# Patient Record
Sex: Male | Born: 1960 | Race: White | Hispanic: No | Marital: Single | Smoking: Current every day smoker
Health system: Southern US, Community
[De-identification: ages and names within clinical notes are randomized; demographics above are authoritative.]

## PROBLEM LIST (undated history)

## (undated) DIAGNOSIS — E119 Type 2 diabetes mellitus without complications: Secondary | ICD-10-CM

## (undated) DIAGNOSIS — K759 Inflammatory liver disease, unspecified: Secondary | ICD-10-CM

## (undated) DIAGNOSIS — I10 Essential (primary) hypertension: Secondary | ICD-10-CM

## (undated) DIAGNOSIS — M1990 Unspecified osteoarthritis, unspecified site: Secondary | ICD-10-CM

## (undated) DIAGNOSIS — J449 Chronic obstructive pulmonary disease, unspecified: Secondary | ICD-10-CM

## (undated) DIAGNOSIS — G629 Polyneuropathy, unspecified: Secondary | ICD-10-CM

## (undated) DIAGNOSIS — N189 Chronic kidney disease, unspecified: Secondary | ICD-10-CM

## (undated) DIAGNOSIS — K219 Gastro-esophageal reflux disease without esophagitis: Secondary | ICD-10-CM

## (undated) DIAGNOSIS — R519 Headache, unspecified: Secondary | ICD-10-CM

## (undated) HISTORY — DX: Chronic kidney disease, unspecified: N18.9

## (undated) HISTORY — PX: FOOT SURGERY: SHX648

## (undated) HISTORY — PX: OTHER SURGICAL HISTORY: SHX169

---

## 2021-07-05 ENCOUNTER — Other Ambulatory Visit: Payer: Self-pay | Admitting: Nephrology

## 2021-07-05 ENCOUNTER — Other Ambulatory Visit (HOSPITAL_COMMUNITY): Payer: Self-pay | Admitting: Nephrology

## 2021-07-05 DIAGNOSIS — N189 Chronic kidney disease, unspecified: Secondary | ICD-10-CM

## 2021-07-09 ENCOUNTER — Ambulatory Visit (INDEPENDENT_AMBULATORY_CARE_PROVIDER_SITE_OTHER): Payer: Medicaid Other | Admitting: Orthopaedic Surgery

## 2021-07-09 ENCOUNTER — Ambulatory Visit: Payer: Medicaid Other

## 2021-07-09 ENCOUNTER — Other Ambulatory Visit: Payer: Self-pay

## 2021-07-09 ENCOUNTER — Encounter: Payer: Self-pay | Admitting: Orthopaedic Surgery

## 2021-07-09 VITALS — BP 148/97 | HR 78 | Ht 69.0 in | Wt 179.2 lb

## 2021-07-09 DIAGNOSIS — G8929 Other chronic pain: Secondary | ICD-10-CM

## 2021-07-09 DIAGNOSIS — S46211S Strain of muscle, fascia and tendon of other parts of biceps, right arm, sequela: Secondary | ICD-10-CM

## 2021-07-09 DIAGNOSIS — M542 Cervicalgia: Secondary | ICD-10-CM

## 2021-07-09 DIAGNOSIS — G5601 Carpal tunnel syndrome, right upper limb: Secondary | ICD-10-CM | POA: Diagnosis not present

## 2021-07-09 DIAGNOSIS — M25511 Pain in right shoulder: Secondary | ICD-10-CM

## 2021-07-09 MED ORDER — HYDROCODONE-ACETAMINOPHEN 5-325 MG PO TABS: ORAL_TABLET | ORAL | 0 refills | Status: DC

## 2021-07-09 NOTE — Progress Notes (Signed)
Subjective:    Patient ID: Curtis Hartman, male    DOB: 22-May-1961, 60 y.o.   MRN: [id number]  HPI He complains of right shoulder pain that has been present many months.  He denies trauma.  He has numbness in his right hand, weakness in the right hand and arm.  He has no redness or swelling.  He has been at [facility] in [place].  I have the notes and I have reviewed the X-rays of the right shoulder which is negative.  I have independently reviewed and interpreted x-rays of this patient done at another site by another physician or qualified health professional.  He has defect of mid upper arm consistent with biceps muscle/tendon rupture.  He has noticed some pain in the neck with motion.  He has good motion of the right shoulder but it pops at times.     Review of Systems  Constitutional:  Positive for activity change.  Musculoskeletal:  Positive for arthralgias, myalgias and neck pain.  All other systems reviewed and are negative. For Review of Systems, all other systems reviewed and are negative.  The following is a summary of the past history medically, past history surgically, known current medicines, social history and family history.  This information is gathered electronically by the computer from prior information and documentation.  I review this each visit and have found including this information at this point in the chart is beneficial and informative.   No past medical history on file.  Past Surgical History:  Procedure Laterality Date   foot right partial toe amputation Right    FOOT SURGERY     hernia surgery x4      No current outpatient medications on file prior to visit.   No current facility-administered medications on file prior to visit.    Social History   Socioeconomic History   Marital status: Single    Spouse name: Not on file   Number of children: Not on file   Years of education: Not on file   Highest education level: Not on file   Occupational History   Not on file  Tobacco Use   Smoking status: Every Day    Types: Cigarettes    Passive exposure: Never   Smokeless tobacco: Never  Substance and Sexual Activity   Alcohol use: Not on file   Drug use: Not on file   Sexual activity: Not on file  Other Topics Concern   Not on file  Social History Narrative   Not on file   Social Determinants of Health   Financial Resource Strain: Not on file  Food Insecurity: Not on file  Transportation Needs: Not on file  Physical Activity: Not on file  Stress: Not on file  Social Connections: Not on file  Intimate Partner Violence: Not on file    Family History  Problem Relation Age of Onset   Heart disease Mother    Seizures Father    Stroke Maternal Grandfather    Liver disease Paternal Grandmother     BP (!) 148/97   Pulse 78   Ht 5\' 9"  (1.753 m)   Wt 179 lb 4 oz (81.3 kg)   BMI 26.47 kg/m   Body mass index is 26.47 kg/m.       Objective:   Physical Exam Vitals and nursing note reviewed. Exam conducted with a chaperone present.  Constitutional:      Appearance: He is well-developed.  HENT:     Head: Normocephalic  and atraumatic.  Eyes:     Conjunctiva/sclera: Conjunctivae normal.     Pupils: Pupils are equal, round, and reactive to light.  Cardiovascular:     Rate and Rhythm: Normal rate and regular rhythm.  Pulmonary:     Effort: Pulmonary effort is normal.  Abdominal:     Palpations: Abdomen is soft.  Musculoskeletal:       Arms:       Hands:     Cervical back: Normal range of motion and neck supple.  Skin:    General: Skin is warm and dry.  Neurological:     Mental Status: He is alert and oriented to person, place, and time.     Cranial Nerves: No cranial nerve deficit.     Motor: No abnormal muscle tone.     Coordination: Coordination normal.     Deep Tendon Reflexes: Reflexes are normal and symmetric. Reflexes normal.  Psychiatric:        Behavior: Behavior normal.         Thought Content: Thought content normal.        Judgment: Judgment normal.  X-rays were done of the cervical spine, reported separately.        Assessment & Plan:   Encounter Diagnoses  Name Primary?   Neck pain Yes   Chronic right shoulder pain    Partial thenar atrophy of right hand    Traumatic partial tear of biceps tendon, right, sequela    I will get MRI of the cervical spine.  I will get EMGs.  I will give pain medicine.  He cannot take NSAIDs.  I have reviewed the [place] Controlled Substance Reporting System web site prior to prescribing narcotic medicine for this patient.  Return in three weeks.  Call if any problem.  Precautions discussed.  Electronically Signed Darreld Mclean, MD 9/6/20222:27 PM

## 2021-07-15 ENCOUNTER — Telehealth: Payer: Self-pay | Admitting: Radiology

## 2021-07-15 NOTE — Telephone Encounter (Signed)
Patient called, said that he is out of pain  medication.  He says it is not really helping with his pain.  Is there anything stronger you can give him?  If not this medication will have to do he says.  He has his MRI, [place] and NCV scheduled, the last of which is the NCV on 08/06/2021.  He has appt with you in office to review all on 08/08/2021.  He will call Dr Percival Blas office and ask to be added to the cancellation list as well.

## 2021-07-17 ENCOUNTER — Telehealth: Payer: Self-pay | Admitting: Orthopaedic Surgery

## 2021-07-17 MED ORDER — HYDROCODONE-ACETAMINOPHEN 5-325 MG PO TABS: ORAL_TABLET | ORAL | 0 refills | Status: DC

## 2021-07-17 NOTE — Telephone Encounter (Signed)
Patient states that he needs preauthorization for the Hydrocodone that you prescribed.    Can this be done or is there something else he can get in place of this?  Please let patient know  Thanks

## 2021-07-22 ENCOUNTER — Other Ambulatory Visit: Payer: Self-pay

## 2021-07-22 ENCOUNTER — Ambulatory Visit (HOSPITAL_COMMUNITY)
Admission: RE | Admit: 2021-07-22 | Discharge: 2021-07-22 | Disposition: A | Discharge status: Home / Self Care | Payer: Medicaid Other | Source: Ambulatory Visit | Attending: Nephrology | Admitting: Nephrology

## 2021-07-22 ENCOUNTER — Ambulatory Visit (HOSPITAL_COMMUNITY)
Admission: RE | Admit: 2021-07-22 | Discharge: 2021-07-22 | Disposition: A | Discharge status: Home / Self Care | Payer: Medicaid Other | Source: Ambulatory Visit | Attending: Orthopaedic Surgery | Admitting: Orthopaedic Surgery

## 2021-07-22 DIAGNOSIS — M542 Cervicalgia: Secondary | ICD-10-CM | POA: Insufficient documentation

## 2021-07-22 DIAGNOSIS — G5601 Carpal tunnel syndrome, right upper limb: Secondary | ICD-10-CM | POA: Insufficient documentation

## 2021-07-22 DIAGNOSIS — N189 Chronic kidney disease, unspecified: Secondary | ICD-10-CM

## 2021-07-24 ENCOUNTER — Telehealth: Payer: Self-pay | Admitting: Radiology

## 2021-07-24 ENCOUNTER — Telehealth: Payer: Self-pay | Admitting: Physical Medicine and Rehabilitation

## 2021-07-24 NOTE — Telephone Encounter (Signed)
Prior authorization  request received via fax for Hydrocodone, put the form they sent along with the Cover my meds sheet in your bottom box.   It is started on cover my meds the key is on the form

## 2021-07-24 NOTE — Telephone Encounter (Signed)
Called patient to advise that we do not have any earlier appointments of that type.

## 2021-07-24 NOTE — Telephone Encounter (Signed)
Pt called requesting a call back as soon as possible. Pt states he would like an earlier appt if possible. He need to know right away due to the fact he may have to move another appt . Please call pt at [phone].

## 2021-08-01 ENCOUNTER — Ambulatory Visit: Payer: Medicaid Other | Admitting: Orthopaedic Surgery

## 2021-08-06 ENCOUNTER — Other Ambulatory Visit: Payer: Self-pay

## 2021-08-06 ENCOUNTER — Encounter: Payer: Self-pay | Admitting: Physical Medicine and Rehabilitation

## 2021-08-06 ENCOUNTER — Ambulatory Visit (INDEPENDENT_AMBULATORY_CARE_PROVIDER_SITE_OTHER): Payer: Medicaid Other | Admitting: Physical Medicine and Rehabilitation

## 2021-08-06 DIAGNOSIS — M4802 Spinal stenosis, cervical region: Secondary | ICD-10-CM

## 2021-08-06 DIAGNOSIS — M5412 Radiculopathy, cervical region: Secondary | ICD-10-CM

## 2021-08-06 DIAGNOSIS — R202 Paresthesia of skin: Secondary | ICD-10-CM

## 2021-08-06 DIAGNOSIS — M79601 Pain in right arm: Secondary | ICD-10-CM | POA: Diagnosis not present

## 2021-08-06 NOTE — Progress Notes (Signed)
A1C 7.8. Right sided neck and shoulder pain radiating to forearm. Right hand numbness. Pain increases at night- wakes him up at night. Right hand dominant No lotion per patient

## 2021-08-07 ENCOUNTER — Encounter: Payer: Self-pay | Admitting: Physical Medicine and Rehabilitation

## 2021-08-07 NOTE — Progress Notes (Signed)
Curtis Hartman - 60 y.o. male MRN [id number]  Date of birth: [birth year]  Office Visit Note: Visit Date: 08/06/2021 PCP: Waldon Reining, MD Referred by: Waldon Reining, MD  Subjective: Chief Complaint  Patient presents with   Neck - Pain   Right Shoulder - Pain   Right Hand - Numbness   HPI:  Curtis Hartman is a 60 y.o. male who comes in today At the request of Dr. Darreld Mclean for evaluation and management of chronic worsening severe neck shoulder and arm pain with referral pattern into the hand in a nondermatomal fashion somewhat more ulnar side of the hand.  Patient has a long history of diabetes with hemoglobin A1c of 7.8.  He is insulin-dependent diabetic.  He has known peripheral polyneuropathy.  No prior electrodiagnostic study to review.  He reports chronic severe pain over the last many months going on now with really worsening to the point where he just cannot stand the pain in his right arm.  He has numbness and tingling in the left hand but does not have pain like on the right.  He has had no prior history of carpal tunnel syndrome etc.  He has had some atrophy of the muscles bilaterally and he does get symptoms of the feet.  He has had no relief really with medication management to this point.  This includes nerve membrane stabilizing medications and opioids.  He did have a recent cervical MRI which is reviewed below in the notes fully.  This does show significant C6-7 paracentral lateral recess narrowing and cord flattening but also below this level at C7-T1 likely representing a synovial cyst complex in nature.  This severely narrows the right eccentric side of the canal.  He has noticed increased weakness.  He has had no prior history of cervical spine surgery.  Review of Systems  Musculoskeletal:  Positive for joint pain and neck pain.  Neurological:  Positive for tingling and focal weakness.  All other systems reviewed and are negative. Otherwise per  HPI.  Assessment & Plan: Visit Diagnoses:    ICD-10-CM   1. Paresthesia of skin  R20.2 NCV with EMG (electromyography)    2. Cervical radiculopathy  M54.12     3. Spinal stenosis of cervical region  M48.02     4. Pain in right arm  M79.601       Plan: Impression: The above electrodiagnostic study is ABNORMAL and very complicated to interpret but reveals evidence of severe sensorimotor length dependent peripheral neuropathy of the right upper extremity.  Given his cervical MRI findings there is likely an underlying C8 radiculopathy although it is hard to elucidate that with the level of neuropathy that he has.  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  There is always difficulty teasing out nerve compression from neuropathy in these instances.  **Clinically his symptoms fit more with the cervical spine issue noted in the MRI.  There is no significant electrodiagnostic evidence of any other focal nerve entrapment, brachial plexopathy or cervical radiculopathy.   Recommendations: 1.  Follow-up with referring physician. 2.  Continue current management of symptoms.  Neurosurgical or spine surgery referral for consultation.  Meds & Orders: No orders of the defined types were placed in this encounter.   Orders Placed This Encounter  Procedures   NCV with EMG (electromyography)    Follow-up: Return in about 2 weeks (around 08/20/2021) for Darreld Mclean,  MD.   Procedures: No procedures performed  EMG & NCV Findings: Evaluation of the right median motor nerve showed prolonged distal onset latency (10.4 ms), reduced amplitude (0.3 mV), and decreased conduction velocity (Elbow-Wrist, 30 m/s).  The right ulnar motor nerve showed prolonged distal onset latency (6.3 ms), reduced amplitude (2.5 mV), decreased conduction velocity (B Elbow-Wrist, 41 m/s), and decreased conduction velocity (A Elbow-B Elbow, 45  m/s).  The right median (across palm) sensory nerve showed no response (Wrist) and prolonged distal peak latency (Palm, 3.2 ms).  The right radial sensory nerve showed prolonged distal peak latency (3.8 ms).  The right ulnar sensory nerve showed prolonged distal peak latency (9.3 ms), reduced amplitude (14.6 V), and decreased conduction velocity (Wrist-5th Digit, 15 m/s).    Needle evaluation of the right first dorsal interosseous and the right abductor pollicis brevis muscles showed decreased insertional activity, widespread spontaneous activity, and diminished recruitment.  All remaining muscles (as indicated in the following table) showed no evidence of electrical instability.    Impression: The above electrodiagnostic study is ABNORMAL and very complicated to interpret but reveals evidence of severe sensorimotor length dependent peripheral neuropathy of the right upper extremity.  Given his cervical MRI findings there is likely an underlying C8 radiculopathy although it is hard to elucidate that with the level of neuropathy that he has.  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  There is always difficulty teasing out nerve compression from neuropathy in these instances.  **Clinically his symptoms fit more with the cervical spine issue noted in the MRI.  There is no significant electrodiagnostic evidence of any other focal nerve entrapment, brachial plexopathy or cervical radiculopathy.   Recommendations: 1.  Follow-up with referring physician. 2.  Continue current management of symptoms.  ___________________________ Naaman Plummer FAAPMR Board Certified, American Board of Physical Medicine and Rehabilitation    Nerve Conduction Studies Anti Sensory Summary Table   Stim Site NR Peak (ms) Norm Peak (ms) P-T Amp (V) Norm P-T Amp Site1 Site2 Delta-P (ms) Dist (cm) Vel (m/s) Norm Vel (m/s)  Right Median  Acr Palm Anti Sensory (2nd Digit)  28.8C  Wrist *NR  <3.6  >10 Wrist Palm  0.0    Palm    *3.2 <2.0 4.8         Right Radial Anti Sensory (Base 1st Digit)  28.6C  Wrist    *3.8 <3.1 4.8  Wrist Base 1st Digit 3.8 0.0    Right Ulnar Anti Sensory (5th Digit)  28.8C  Wrist    *9.3 <3.7 *14.6 >15.0 Wrist 5th Digit 9.3 14.0 *15 >38   Motor Summary Table   Stim Site NR Onset (ms) Norm Onset (ms) O-P Amp (mV) Norm O-P Amp Site1 Site2 Delta-0 (ms) Dist (cm) Vel (m/s) Norm Vel (m/s)  Right Median Motor (Abd Poll Brev)  28.7C  Wrist    *10.4 <4.2 *0.3 >5 Elbow Wrist 7.2 21.5 *30 >50  Elbow    17.6  0.2         Right Ulnar Motor (Abd Dig Min)  28.7C  Wrist    *6.3 <4.2 *2.5 >3 B Elbow Wrist 4.9 20.0 *41 >53  B Elbow    11.2  1.8  A Elbow B Elbow 2.2 10.0 *45 >53  A Elbow    13.4  1.5          EMG   Side Muscle Nerve Root Ins Act  Fibs Psw Amp Dur Poly Recrt Int Dennie Bible Comment  Right 1stDorInt Ulnar C8-T1 *Decr *4+ *4+ Nml Nml 0 *Reduced Nml   Right Abd Poll Brev Median C8-T1 *Decr *4+ *4+ Nml Nml 0 *Reduced Nml   Right ExtDigCom   Nml Nml Nml Nml Nml 0 Nml Nml   Right Triceps Radial C6-7-8 Nml Nml Nml Nml Nml 0 Nml Nml   Right Deltoid Axillary C5-6 Nml Nml Nml Nml Nml 0 Nml Nml     Nerve Conduction Studies Anti Sensory Left/Right Comparison   Stim Site L Lat (ms) R Lat (ms) L-R Lat (ms) L Amp (V) R Amp (V) L-R Amp (%) Site1 Site2 L Vel (m/s) R Vel (m/s) L-R Vel (m/s)  Median Acr Palm Anti Sensory (2nd Digit)  28.8C  Wrist       Wrist Palm     Palm  *3.2   4.8        Radial Anti Sensory (Base 1st Digit)  28.6C  Wrist  *3.8   4.8  Wrist Base 1st Digit     Ulnar Anti Sensory (5th Digit)  28.8C  Wrist  *9.3   *14.6  Wrist 5th Digit  *15    Motor Left/Right Comparison   Stim Site L Lat (ms) R Lat (ms) L-R Lat (ms) L Amp (mV) R Amp (mV) L-R Amp (%) Site1 Site2 L Vel (m/s) R Vel (m/s) L-R Vel (m/s)  Median Motor (Abd Poll Brev)  28.7C  Wrist  *10.4   *0.3  Elbow Wrist  *30   Elbow   17.6   0.2        Ulnar Motor (Abd Dig Min)  28.7C  Wrist  *6.3   *2.5  B Elbow Wrist  *41   B Elbow  11.2   1.8  A Elbow B Elbow  *45   A Elbow  13.4   1.5           Waveforms:            Clinical History: MRI CERVICAL SPINE WITHOUT CONTRAST   TECHNIQUE: Multiplanar, multisequence MR imaging of the cervical spine was performed. No intravenous contrast was administered.   COMPARISON:  Cervical radiographs 07/09/2021 without report.   FINDINGS: Alignment: Straightening of the normal cervical lordosis. Slight anterolisthesis of C7 on T1. Otherwise, no sagittal subluxation.   Vertebrae: Vertebral body heights are maintained. No focal marrow edema to suggest acute fracture or discitis/osteomyelitis.   Cord: Normal cord signal.   Posterior Fossa, vertebral arteries, paraspinal tissues: Partially imaged lipoma in the left posterior paraspinal soft tissues superficially. Visualized vertebral artery flow voids are maintained. No evidence of acute abnormality in the visualized posterior fossa.   Disc levels:   C2-C3: Small posterior disc osteophyte complex and left-greater-than-right facet and uncovertebral hypertrophy with mild left foraminal stenosis. No significant canal stenosis.   C3-C4: Small posterior disc osteophyte complex and left greater than right facet and uncovertebral hypertrophy with moderate left-greater-than-right foraminal stenosis and mild canal stenosis.   C4-C5: Posterior disc osteophyte complex and bilateral facet and uncovertebral hypertrophy. Resulting severe left-greater-than-right foraminal stenosis with moderate canal stenosis. Disc flattens the ventral cord.   C5-C6: Small posterior disc osteophyte complex and left-greater-than-right uncovertebral hypertrophy with mild left foraminal stenosis. No significant canal stenosis.   C6-C7: Right eccentric posterior disc osteophyte complex with mild-to-moderate right eccentric canal stenosis.  This flattens the right ventral cord. Severe right and mild left foraminal stenosis.   C7-T1: Approximately 7 x 9 mm peripherally  T2 hypointense centrally T2 hyperintense structure in the posterolateral right canal associated with the right facet joint, probably a complex synovial cyst. Resulting severely narrows the right eccentric canal. The cord is displaced to the left with overall mild to moderate canal stenosis. Otherwise, no significant foraminal stenosis.   IMPRESSION: 1. At C4-C5, moderate canal stenosis with severe left-greater-than-right foraminal stenosis. 2. At C6-C7, severe right and mild left foraminal stenosis with mild-to-moderate right eccentric canal stenosis. 3. 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 Overall mild to moderate canal stenosis. 4. At C3-C4, mild canal stenosis with moderate left-greater-than-right foraminal stenosis.     Electronically Signed   By: Feliberto Harts M.D.   On: 07/23/2021 09:07     Objective:  VS:  HT:    WT:   BMI:     BP:   HR: bpm  TEMP: ( )  RESP:  Physical Exam Musculoskeletal:     Cervical back: Tenderness present. No rigidity.     Comments: Inspection reveals atrophy of the bilateral APB and FDI and hand intrinsics.  There appears to be atrophy of the right proximal biceps compared to left.  No history of biceps tendon rupture.  There is no swelling, color changes, allodynia or dystrophic changes. There is 3 out of 5 strength in the bilateral finger abduction but 5 out of 5 with long finger flexion.  There is nondermatomal decrease sensation bilaterally in the hands.  There is a positive Froment's test bilaterally. There is a negative Hoffmann's test bilaterally.  Lymphadenopathy:     Cervical: No cervical adenopathy.  Skin:    General: Skin is warm and dry.      Findings: No erythema or rash.  Neurological:     General: No focal deficit present.     Mental Status: He is alert and oriented to person, place, and time.     Sensory: No sensory deficit.     Motor: No weakness or abnormal muscle tone.     Coordination: Coordination normal.     Gait: Gait normal.  Psychiatric:        Mood and Affect: Mood normal.        Behavior: Behavior normal.        Thought Content: Thought content normal.     Imaging: No results found.

## 2021-08-07 NOTE — Procedures (Signed)
EMG & NCV Findings: Evaluation of the right median motor nerve showed prolonged distal onset latency (10.4 ms), reduced amplitude (0.3 mV), and decreased conduction velocity (Elbow-Wrist, 30 m/s).  The right ulnar motor nerve showed prolonged distal onset latency (6.3 ms), reduced amplitude (2.5 mV), decreased conduction velocity (B Elbow-Wrist, 41 m/s), and decreased conduction velocity (A Elbow-B Elbow, 45 m/s).  The right median (across palm) sensory nerve showed no response (Wrist) and prolonged distal peak latency (Palm, 3.2 ms).  The right radial sensory nerve showed prolonged distal peak latency (3.8 ms).  The right ulnar sensory nerve showed prolonged distal peak latency (9.3 ms), reduced amplitude (14.6 V), and decreased conduction velocity (Wrist-5th Digit, 15 m/s).    Needle evaluation of the right first dorsal interosseous and the right abductor pollicis brevis muscles showed decreased insertional activity, widespread spontaneous activity, and diminished recruitment.  All remaining muscles (as indicated in the following table) showed no evidence of electrical instability.    Impression: The above electrodiagnostic study is ABNORMAL and very complicated to interpret but reveals evidence of severe sensorimotor length dependent peripheral neuropathy of the right upper extremity.  Given his cervical MRI findings there is likely an underlying C8 radiculopathy although it is hard to elucidate that with the level of neuropathy that he has.  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  There is always difficulty teasing out nerve compression from neuropathy in these instances.  **Clinically his symptoms fit more with the cervical spine issue noted in the MRI.  There is no significant electrodiagnostic evidence of any other focal nerve entrapment, brachial plexopathy or cervical radiculopathy.    Recommendations: 1.  Follow-up with referring physician. 2.  Continue current management of symptoms.  ___________________________ Curtis Hartman FAAPMR Board Certified, American Board of Physical Medicine and Rehabilitation    Nerve Conduction Studies Anti Sensory Summary Table   Stim Site NR Peak (ms) Norm Peak (ms) P-T Amp (V) Norm P-T Amp Site1 Site2 Delta-P (ms) Dist (cm) Vel (m/s) Norm Vel (m/s)  Right Median Acr Palm Anti Sensory (2nd Digit)  28.8C  Wrist *NR  <3.6  >10 Wrist Palm  0.0    Palm    *3.2 <2.0 4.8         Right Radial Anti Sensory (Base 1st Digit)  28.6C  Wrist    *3.8 <3.1 4.8  Wrist Base 1st Digit 3.8 0.0    Right Ulnar Anti Sensory (5th Digit)  28.8C  Wrist    *9.3 <3.7 *14.6 >15.0 Wrist 5th Digit 9.3 14.0 *15 >38   Motor Summary Table   Stim Site NR Onset (ms) Norm Onset (ms) O-P Amp (mV) Norm O-P Amp Site1 Site2 Delta-0 (ms) Dist (cm) Vel (m/s) Norm Vel (m/s)  Right Median Motor (Abd Poll Brev)  28.7C  Wrist    *10.4 <4.2 *0.3 >5 Elbow Wrist 7.2 21.5 *30 >50  Elbow    17.6  0.2         Right Ulnar Motor (Abd Dig Min)  28.7C  Wrist    *6.3 <4.2 *2.5 >3 B Elbow Wrist 4.9 20.0 *41 >53  B Elbow    11.2  1.8  A Elbow B Elbow 2.2 10.0 *45 >53  A Elbow    13.4  1.5          EMG   Side Muscle Nerve Root Ins Act Fibs Psw Amp Dur Poly Recrt Int Dennie Bible Comment  Right 1stDorInt Ulnar C8-T1 *Decr *4+ *4+ Nml Nml 0 *Reduced Nml   Right Abd Poll Brev Median C8-T1 *Decr *4+ *4+ Nml Nml 0 *Reduced Nml   Right ExtDigCom   Nml Nml Nml Nml Nml 0 Nml Nml   Right Triceps Radial C6-7-8 Nml Nml Nml Nml Nml 0 Nml Nml   Right Deltoid Axillary C5-6 Nml Nml Nml Nml Nml 0 Nml Nml     Nerve Conduction Studies Anti Sensory Left/Right Comparison   Stim Site L Lat (ms) R Lat (ms) L-R Lat (ms) L Amp (V) R Amp (V) L-R Amp (%) Site1 Site2 L Vel (m/s) R Vel (m/s) L-R Vel (m/s)  Median Acr Palm Anti Sensory (2nd Digit)  28.8C  Wrist       Wrist Palm     Palm  *3.2   4.8         Radial Anti Sensory (Base 1st Digit)  28.6C  Wrist  *3.8   4.8  Wrist Base 1st Digit     Ulnar Anti Sensory (5th Digit)  28.8C  Wrist  *9.3   *14.6  Wrist 5th Digit  *15    Motor Left/Right Comparison   Stim Site L Lat (ms) R Lat (ms) L-R Lat (ms) L Amp (mV) R Amp (mV) L-R Amp (%) Site1 Site2 L Vel (m/s) R Vel (m/s) L-R Vel (m/s)  Median Motor (Abd Poll Brev)  28.7C  Wrist  *10.4   *0.3  Elbow Wrist  *30   Elbow  17.6   0.2        Ulnar Motor (Abd Dig Min)  28.7C  Wrist  *6.3   *2.5  B Elbow Wrist  *41   B Elbow  11.2   1.8  A Elbow B Elbow  *45   A Elbow  13.4   1.5           Waveforms:

## 2021-08-08 ENCOUNTER — Encounter: Payer: Self-pay | Admitting: Orthopaedic Surgery

## 2021-08-08 ENCOUNTER — Ambulatory Visit (INDEPENDENT_AMBULATORY_CARE_PROVIDER_SITE_OTHER): Payer: Medicaid Other | Admitting: Orthopaedic Surgery

## 2021-08-08 ENCOUNTER — Other Ambulatory Visit: Payer: Self-pay

## 2021-08-08 DIAGNOSIS — M542 Cervicalgia: Secondary | ICD-10-CM

## 2021-08-08 MED ORDER — HYDROCODONE-ACETAMINOPHEN 5-325 MG PO TABS: 1.0000 | ORAL_TABLET | ORAL | 0 refills | Status: AC | PRN

## 2021-08-08 NOTE — Progress Notes (Signed)
I am worse.  He had the MRI of the neck and EMGs by Dr. Alvester Morin.  Dr. 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  Given his cervical MRI findings there is likely an underlying C8 radiculopathy although it is hard to elucidate that with the level of neuropathy that he has.  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  There is always difficulty teasing out nerve compression from neuropathy in these instances.  MRI showed: IMPRESSION: 1. At C4-C5, moderate canal stenosis with severe left-greater-than-right foraminal stenosis. 2. At C6-C7, severe right and mild left foraminal stenosis with mild-to-moderate right eccentric canal stenosis. 3. 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 Overall mild to moderate canal stenosis. 4. At C3-C4, mild canal stenosis with moderate left-greater-than-right foraminal stenosis.   His pain is on the right.  He is not sleeping well.  He is taking Advil.  I will renew Norco.  I will have him see neurosurgeon.    I have explained the MRI and the nerve study to him.  He is in pain.  I have independently reviewed the MRI.    Right arm is painful but has full ROM.  He has weakness on the right and triceps as well.  ROM of neck is full but tender. Encounter Diagnosis  Name Primary?   Neck pain Yes   I have reviewed the [place] Controlled Substance Reporting System web site prior to prescribing narcotic medicine for this patient.  To see neurosurgeon.  Call if any problem.  Precautions discussed.  Electronically Signed Darreld Mclean,  MD 10/6/202210:55 AM

## 2021-08-12 ENCOUNTER — Telehealth: Payer: Self-pay | Admitting: Orthopaedic Surgery

## 2021-08-12 NOTE — Telephone Encounter (Signed)
Hydrocodone-Acetaminophen  5/325 mg  Qty 30 Tablets   PATIENT USES [facility] IN [place]

## 2021-08-12 NOTE — Telephone Encounter (Signed)
Patient called and is upset that he has not been referred to a specialist yet.  I advised him there is one to [facility].  He said no one has called him yet.  His neck is killing him and he only has a half of days worth of pain medicine lett.   He needs something called in for his pain   Please call him back at [phone]

## 2021-08-14 ENCOUNTER — Other Ambulatory Visit (HOSPITAL_COMMUNITY): Payer: Self-pay | Admitting: Nephrology

## 2021-08-14 DIAGNOSIS — D631 Anemia in chronic kidney disease: Secondary | ICD-10-CM

## 2021-08-14 DIAGNOSIS — E1122 Type 2 diabetes mellitus with diabetic chronic kidney disease: Secondary | ICD-10-CM

## 2021-08-14 DIAGNOSIS — R809 Proteinuria, unspecified: Secondary | ICD-10-CM

## 2021-08-14 DIAGNOSIS — N189 Chronic kidney disease, unspecified: Secondary | ICD-10-CM

## 2021-08-14 NOTE — Telephone Encounter (Signed)
Patient called back again today stating he needs pain medicine.  He went to his kidney doctor today and he said he needs to not take anymore Ibuprofen due to his issues with his kidneys.  Dr. Hilda Lias only gave him 5 days worth of pain medicine and he ran out 2 days he said he is suppose to take 4 a day and he can't sleep and he is in pain VERY BAD pain!!    He is going to need something for the pain for 6 to 8 weeks before he can see the neurologist    Please call him back at [phone]

## 2021-08-15 ENCOUNTER — Encounter: Payer: Self-pay | Admitting: Internal Medicine

## 2021-08-15 NOTE — Telephone Encounter (Signed)
I spoke with patient yesterday (08/14/21) and it is documented in his referral. He told me that he hadn't heard anything from [facility], so I let him know that I had spoken with them before talking with him and they had left him a message for him to call them. I gave him their number again and told him I was told they could possibly get him in as early as next week if he calls back. He just called again and I told him exactly the same thing as before. He stated he tried calling them yesterday and got no answer, I told him that was hard to believe but he needed to keep calling until he gets someone to answer, so he could get an appointment. I also told him that Dr. Hilda Lias is not prescribing any medication at least until he knows he has an appointment with [facility] and then we can go from there.

## 2021-08-27 ENCOUNTER — Ambulatory Visit (HOSPITAL_COMMUNITY): Admission: RE | Admit: 2021-08-27 | Payer: Medicaid Other | Source: Ambulatory Visit

## 2021-08-27 ENCOUNTER — Encounter (HOSPITAL_COMMUNITY): Payer: Self-pay

## 2021-09-05 ENCOUNTER — Other Ambulatory Visit: Payer: Self-pay | Admitting: Radiology

## 2021-09-09 ENCOUNTER — Ambulatory Visit (HOSPITAL_COMMUNITY)
Admission: RE | Admit: 2021-09-09 | Discharge: 2021-09-09 | Disposition: A | Discharge status: Home / Self Care | Payer: Medicaid Other | Source: Ambulatory Visit | Attending: Nephrology | Admitting: Nephrology

## 2021-09-09 ENCOUNTER — Encounter (HOSPITAL_COMMUNITY): Payer: Self-pay

## 2021-09-09 DIAGNOSIS — N189 Chronic kidney disease, unspecified: Secondary | ICD-10-CM | POA: Insufficient documentation

## 2021-09-09 DIAGNOSIS — D631 Anemia in chronic kidney disease: Secondary | ICD-10-CM | POA: Insufficient documentation

## 2021-09-09 DIAGNOSIS — E1140 Type 2 diabetes mellitus with diabetic neuropathy, unspecified: Secondary | ICD-10-CM | POA: Insufficient documentation

## 2021-09-09 DIAGNOSIS — R809 Proteinuria, unspecified: Secondary | ICD-10-CM | POA: Insufficient documentation

## 2021-09-09 DIAGNOSIS — E1122 Type 2 diabetes mellitus with diabetic chronic kidney disease: Secondary | ICD-10-CM | POA: Insufficient documentation

## 2021-09-09 DIAGNOSIS — I129 Hypertensive chronic kidney disease with stage 1 through stage 4 chronic kidney disease, or unspecified chronic kidney disease: Secondary | ICD-10-CM | POA: Insufficient documentation

## 2021-09-09 DIAGNOSIS — E1121 Type 2 diabetes mellitus with diabetic nephropathy: Secondary | ICD-10-CM | POA: Diagnosis not present

## 2021-09-09 HISTORY — DX: Type 2 diabetes mellitus without complications: E11.9

## 2021-09-09 HISTORY — DX: Essential (primary) hypertension: I10

## 2021-09-09 LAB — CBC
HCT: 35.4 % — ABNORMAL LOW (ref 39.0–52.0)
Hemoglobin: 11.9 g/dL — ABNORMAL LOW (ref 13.0–17.0)
MCH: 31.2 pg (ref 26.0–34.0)
MCHC: 33.6 g/dL (ref 30.0–36.0)
MCV: 92.7 fL (ref 80.0–100.0)
Platelets: 328 10*3/uL (ref 150–400)
RBC: 3.82 MIL/uL — ABNORMAL LOW (ref 4.22–5.81)
RDW: 12.7 % (ref 11.5–15.5)
WBC: 9.1 10*3/uL (ref 4.0–10.5)
nRBC: 0 % (ref 0.0–0.2)

## 2021-09-09 LAB — PROTIME-INR
INR: 0.9 (ref 0.8–1.2)
Prothrombin Time: 12.2 seconds (ref 11.4–15.2)

## 2021-09-09 LAB — GLUCOSE, CAPILLARY: Glucose-Capillary: 262 mg/dL — ABNORMAL HIGH (ref 70–99)

## 2021-09-09 MED ORDER — SODIUM CHLORIDE 0.9 % IV SOLN: INTRAVENOUS | Status: DC

## 2021-09-09 MED ORDER — LIDOCAINE HCL (PF) 1 % IJ SOLN
INTRAMUSCULAR | Status: AC
  Filled 2021-09-09: qty 30

## 2021-09-09 MED ORDER — AMLODIPINE BESYLATE 10 MG PO TABS
10.0000 mg | ORAL_TABLET | ORAL | Status: AC
  Administered 2021-09-09: 10 mg via ORAL
  Filled 2021-09-09: qty 1

## 2021-09-09 MED ORDER — GELATIN ABSORBABLE 12-7 MM EX MISC
CUTANEOUS | Status: AC
  Filled 2021-09-09: qty 1

## 2021-09-09 MED ORDER — HYDRALAZINE HCL 20 MG/ML IJ SOLN
INTRAMUSCULAR | Status: AC | PRN
  Administered 2021-09-09 (×2): 10 mg via INTRAVENOUS

## 2021-09-09 MED ORDER — SODIUM CHLORIDE 0.9 % IV SOLN
INTRAVENOUS | Status: AC | PRN
Start: 2021-09-09 — End: 2021-09-09
  Administered 2021-09-09: 10 mL/h via INTRAVENOUS

## 2021-09-09 MED ORDER — FENTANYL CITRATE (PF) 100 MCG/2ML IJ SOLN
INTRAMUSCULAR | Status: AC | PRN
  Administered 2021-09-09: 25 ug via INTRAVENOUS
  Administered 2021-09-09: 50 ug via INTRAVENOUS

## 2021-09-09 MED ORDER — LOSARTAN POTASSIUM 25 MG PO TABS
12.5000 mg | ORAL_TABLET | ORAL | Status: AC
  Administered 2021-09-09: 12.5 mg via ORAL
  Filled 2021-09-09: qty 0.5

## 2021-09-09 MED ORDER — HYDRALAZINE HCL 20 MG/ML IJ SOLN
INTRAMUSCULAR | Status: AC
  Filled 2021-09-09: qty 1

## 2021-09-09 MED ORDER — MIDAZOLAM HCL 2 MG/2ML IJ SOLN
INTRAMUSCULAR | Status: AC | PRN
  Administered 2021-09-09: 1 mg via INTRAVENOUS
  Administered 2021-09-09: .5 mg via INTRAVENOUS

## 2021-09-09 MED ORDER — MIDAZOLAM HCL 2 MG/2ML IJ SOLN
INTRAMUSCULAR | Status: AC
  Filled 2021-09-09: qty 2

## 2021-09-09 MED ORDER — FENTANYL CITRATE (PF) 100 MCG/2ML IJ SOLN
INTRAMUSCULAR | Status: AC
  Filled 2021-09-09: qty 2

## 2021-09-09 NOTE — Sedation Documentation (Signed)
Called to give report. Nurse unavailable. Will call back 

## 2021-09-09 NOTE — H&P (Signed)
Chief Complaint: Patient was seen in consultation today for random renal biopsy at the request of [place] S  Referring Physician(s): [place] [facility]  Supervising Physician: Markus Daft  Patient Status: [facility] - Out-pt  History of Present Illness: Curtis Hartman is a 60 y.o. male   PMD noted increase in renal functions Was sent to Dr Quenten Raven for evaluation HTN Proteinuria CKD; DM Hep C  Scheduled now for random renal biopsy per Nephrology    Past Medical History:  Diagnosis Date   Diabetes mellitus without complication ([place])    Hypertension     Past Surgical History:  Procedure Laterality Date   foot right partial toe amputation Right    FOOT SURGERY     hernia surgery x4      Allergies: Patient has no known allergies.  Medications: Prior to Admission medications   Medication Sig Start Date End Date Taking? Authorizing Provider  amLODipine (NORVASC) 10 MG tablet Take 10 mg by mouth daily. 07/26/21  Yes [provider]  atorvastatin (LIPITOR) 20 MG tablet Take 20 mg by mouth daily. 06/12/21  Yes [provider]  cholecalciferol (VITAMIN D3) 25 MCG (1000 UNIT) tablet Take 1,000 Units by mouth daily.   Yes [provider]  gabapentin (NEURONTIN) 100 MG capsule Take 100 mg by mouth 5 (five) times daily as needed (pain).   Yes [provider]  insulin aspart (NOVOLOG) 100 UNIT/ML injection Inject 2-10 Units into the skin 3 (three) times daily as needed (blood sugar over 150).   Yes [provider]  LANTUS 100 UNIT/ML injection Inject 10 Units into the skin at bedtime. 03/05/21  Yes [provider]  losartan (COZAAR) 25 MG tablet Take 12.5 mg by mouth daily.   Yes [provider]  sodium bicarbonate 650 MG tablet Take 650 mg by mouth 3 (three) times daily.   Yes [provider]     Family History  Problem Relation Age of Onset   Heart disease Mother    Seizures Father    Stroke  Maternal Grandfather    Liver disease Paternal Grandmother     Social History   Socioeconomic History   Marital status: Single    Spouse name: Not on file   Number of children: Not on file   Years of education: Not on file   Highest education level: Not on file  Occupational History   Not on file  Tobacco Use   Smoking status: Every Day    Types: Cigarettes    Passive exposure: Never   Smokeless tobacco: Never  Vaping Use   Vaping Use: Never used  Substance and Sexual Activity   Alcohol use: Not Currently   Drug use: Not Currently   Sexual activity: Not on file  Other Topics Concern   Not on file  Social History Narrative   Not on file   Social Determinants of Health   Financial Resource Strain: Not on file  Food Insecurity: Not on file  Transportation Needs: Not on file  Physical Activity: Not on file  Stress: Not on file  Social Connections: Not on file    Review of Systems: A 12 point ROS discussed and pertinent positives are indicated in the HPI above.  All other systems are negative.  Review of Systems  Constitutional:  Negative for activity change, appetite change, fatigue and fever.  Respiratory:  Negative for cough and shortness of breath.   Cardiovascular:  Negative for chest pain.  Gastrointestinal:  Positive for  diarrhea. Negative for abdominal pain and nausea.  Neurological:  Negative for weakness.  Psychiatric/Behavioral:  Negative for behavioral problems and confusion.    Vital Signs: BP (!) 166/94   Pulse 85   Temp 97.9 F (36.6 C) (Oral)   Resp 16   Ht 5\' 9"  (1.753 m)   Wt 175 lb (79.4 kg)   SpO2 90%   BMI 25.84 kg/m   Physical Exam Vitals reviewed.  HENT:     Mouth/Throat:     Mouth: Mucous membranes are moist.  Cardiovascular:     Rate and Rhythm: Normal rate and regular rhythm.     Heart sounds: Normal heart sounds.  Pulmonary:     Effort: Pulmonary effort is normal.     Breath sounds: Normal breath sounds.  Abdominal:      Tenderness: There is no abdominal tenderness.  Musculoskeletal:        General: Normal range of motion.     Comments: Rt toe amputation  Skin:    General: Skin is warm.  Neurological:     Mental Status: He is alert and oriented to person, place, and time.  Psychiatric:        Behavior: Behavior normal.    Imaging: No results found.  Labs:  CBC: Recent Labs    09/09/21 0630  WBC 9.1  HGB 11.9*  HCT 35.4*  PLT 328    COAGS: Recent Labs    09/09/21 0630  INR 0.9    BMP: No results for input(s): NA, K, CL, CO2, GLUCOSE, BUN, CALCIUM, CREATININE, GFRNONAA, GFRAA in the last 8760 hours.  Invalid input(s): CMP  LIVER FUNCTION TESTS: No results for input(s): BILITOT, AST, ALT, ALKPHOS, PROT, ALBUMIN in the last 8760 hours.  TUMOR MARKERS: No results for input(s): AFPTM, CEA, CA199, CHROMGRNA in the last 8760 hours.  Assessment and Plan:  HTN; DM CKD Proteinuria  Scheduled for random renal biopsy per Nephrology Risks and benefits of random renal biopsy was discussed with the patient and/or patient's family including, but not limited to bleeding, infection, damage to adjacent structures or low yield requiring additional tests.  All of the questions were answered and there is agreement to proceed. Consent signed and in chart.   Thank you for this interesting consult.  I greatly enjoyed meeting Curtis Hartman and look forward to participating in their care.  A copy of this report was sent to the requesting provider on this date.  Electronically Signed: Minette Brine, PA-C 09/09/2021, 7:22 AM   I spent a total of  30 Minutes   in face to face in clinical consultation, greater than 50% of which was counseling/coordinating care for random renal biopsy

## 2021-09-09 NOTE — Procedures (Signed)
Interventional Radiology Procedure Note  Procedure: US guided biopsy of left kidney, medical renal Complications: None EBL: None Recommendations: - Bedrest 2 hours.   - Routine wound care - Follow up pathology - Advance diet   Signed,  Bill Yohn, DO   

## 2021-09-18 ENCOUNTER — Encounter (HOSPITAL_COMMUNITY): Payer: Self-pay

## 2021-09-18 LAB — SURGICAL PATHOLOGY

## 2021-09-23 DIAGNOSIS — M5412 Radiculopathy, cervical region: Secondary | ICD-10-CM | POA: Insufficient documentation

## 2021-10-29 DIAGNOSIS — E119 Type 2 diabetes mellitus without complications: Secondary | ICD-10-CM | POA: Insufficient documentation

## 2021-10-29 DIAGNOSIS — N183 Chronic kidney disease, stage 3 unspecified: Secondary | ICD-10-CM | POA: Insufficient documentation

## 2021-10-29 DIAGNOSIS — I10 Essential (primary) hypertension: Secondary | ICD-10-CM | POA: Insufficient documentation

## 2021-11-03 HISTORY — PX: BACK SURGERY: SHX140

## 2021-11-05 DIAGNOSIS — Z7409 Other reduced mobility: Secondary | ICD-10-CM | POA: Insufficient documentation

## 2022-01-05 ENCOUNTER — Encounter: Payer: Self-pay | Admitting: Gastroenterology

## 2022-01-05 NOTE — Progress Notes (Deleted)
? ?Referring Provider: Randa Lynn, MD ?Primary Care Physician:  Waldon Reining, MD ?Primary Gastroenterologist:  Dr. Marland Kitchen ? ?No chief complaint on file. ? ? ?HPI:   ?Curtis Hartman is a 61 y.o. male presenting today at the request of Bhutani, Alger Memos, MD for anemia.  ? ?Today:  ? ? ?Hgb ?16 July 2021 ?11.11 Sep 2021 ?12.9 10/23/21,  ?9.09 Nov 2021- s/p neck/back surgery.  ?12.0 12/24/21 ?Cr 1.95 12/24/21 ? ?Iron panel wnl September 2022. ? ?Hepatitis C RNA 2,410,000 Sept.  ?LFTs wnl Dec.  ? ? ?CKD ? ?Past Medical History:  ?Diagnosis Date  ? Diabetes mellitus without complication (HCC)   ? Hypertension   ? ? ?Past Surgical History:  ?Procedure Laterality Date  ? foot right partial toe amputation Right   ? FOOT SURGERY    ? hernia surgery x4    ? ? ?Current Outpatient Medications  ?Medication Sig Dispense Refill  ? amLODipine (NORVASC) 10 MG tablet Take 10 mg by mouth daily.    ? atorvastatin (LIPITOR) 20 MG tablet Take 20 mg by mouth daily.    ? cholecalciferol (VITAMIN D3) 25 MCG (1000 UNIT) tablet Take 1,000 Units by mouth daily.    ? gabapentin (NEURONTIN) 100 MG capsule Take 100 mg by mouth 5 (five) times daily as needed (pain).    ? insulin aspart (NOVOLOG) 100 UNIT/ML injection Inject 2-10 Units into the skin 3 (three) times daily as needed (blood sugar over 150).    ? LANTUS 100 UNIT/ML injection Inject 10 Units into the skin at bedtime.    ? losartan (COZAAR) 25 MG tablet Take 12.5 mg by mouth daily.    ? sodium bicarbonate 650 MG tablet Take 650 mg by mouth 3 (three) times daily.    ? ?No current facility-administered medications for this visit.  ? ? ?Allergies as of 01/06/2022  ? (No Known Allergies)  ? ? ?Family History  ?Problem Relation Age of Onset  ? Heart disease Mother   ? Seizures Father   ? Stroke Maternal Grandfather   ? Liver disease Paternal Grandmother   ? ? ?Social History  ? ?Socioeconomic History  ? Marital status: Single  ?  Spouse name: Not on file  ? Number of children:  Not on file  ? Years of education: Not on file  ? Highest education level: Not on file  ?Occupational History  ? Not on file  ?Tobacco Use  ? Smoking status: Every Day  ?  Types: Cigarettes  ?  Passive exposure: Never  ? Smokeless tobacco: Never  ?Vaping Use  ? Vaping Use: Never used  ?Substance and Sexual Activity  ? Alcohol use: Not Currently  ? Drug use: Not Currently  ? Sexual activity: Not on file  ?Other Topics Concern  ? Not on file  ?Social History Narrative  ? Not on file  ? ?Social Determinants of Health  ? ?Financial Resource Strain: Not on file  ?Food Insecurity: Not on file  ?Transportation Needs: Not on file  ?Physical Activity: Not on file  ?Stress: Not on file  ?Social Connections: Not on file  ?Intimate Partner Violence: Not on file  ? ? ?Review of Systems: ?Gen: Denies any fever, chills, fatigue, weight loss, lack of appetite.  ?CV: Denies chest pain, heart palpitations, peripheral edema, syncope.  ?Resp: Denies shortness of breath at rest or with exertion. Denies wheezing or cough.  ?GI: Denies dysphagia or odynophagia. Denies jaundice, hematemesis, fecal incontinence. ?GU : Denies urinary burning, urinary  frequency, urinary hesitancy ?MS: Denies joint pain, muscle weakness, cramps, or limitation of movement.  ?Derm: Denies rash, itching, dry skin ?Psych: Denies depression, anxiety, memory loss, and confusion ?Heme: Denies bruising, bleeding, and enlarged lymph nodes. ? ?Physical Exam: ?There were no vitals taken for this visit. ?General:   Alert and oriented. Pleasant and cooperative. Well-nourished and well-developed.  ?Head:  Normocephalic and atraumatic. ?Eyes:  Without icterus, sclera clear and conjunctiva pink.  ?Ears:  Normal auditory acuity. ?Nose:  No deformity, discharge,  or lesions. ?Mouth:  No deformity or lesions, oral mucosa pink.  ?Neck:  Supple, without mass or thyromegaly. ?Lungs:  Clear to auscultation bilaterally. No wheezes, rales, or rhonchi. No distress.  ?Heart:  S1, S2  present without murmurs appreciated.  ?Abdomen:  +BS, soft, non-tender and non-distended. No HSM noted. No guarding or rebound. No masses appreciated.  ?Rectal:  Deferred  ?Msk:  Symmetrical without gross deformities. Normal posture. ?Pulses:  Normal pulses noted. ?Extremities:  Without clubbing or edema. ?Neurologic:  Alert and  oriented x4;  grossly normal neurologically. ?Skin:  Intact without significant lesions or rashes. ?Cervical Nodes:  No significant cervical adenopathy. ?Psych:  Alert and cooperative. Normal mood and affect. ? ?

## 2022-01-06 ENCOUNTER — Ambulatory Visit: Payer: Medicaid Other | Admitting: Gastroenterology

## 2022-01-30 ENCOUNTER — Other Ambulatory Visit: Payer: Self-pay

## 2022-01-30 ENCOUNTER — Ambulatory Visit (INDEPENDENT_AMBULATORY_CARE_PROVIDER_SITE_OTHER): Payer: Medicaid Other | Admitting: Internal Medicine

## 2022-01-30 VITALS — BP 110/58 | HR 84 | Temp 97.7°F | Ht 67.0 in | Wt 156.6 lb

## 2022-01-30 DIAGNOSIS — K219 Gastro-esophageal reflux disease without esophagitis: Secondary | ICD-10-CM | POA: Diagnosis not present

## 2022-01-30 DIAGNOSIS — B182 Chronic viral hepatitis C: Secondary | ICD-10-CM

## 2022-01-30 DIAGNOSIS — B1920 Unspecified viral hepatitis C without hepatic coma: Secondary | ICD-10-CM

## 2022-01-30 DIAGNOSIS — R1031 Right lower quadrant pain: Secondary | ICD-10-CM

## 2022-01-30 DIAGNOSIS — R197 Diarrhea, unspecified: Secondary | ICD-10-CM

## 2022-01-30 MED ORDER — PANTOPRAZOLE SODIUM 40 MG PO TBEC: 40.0000 mg | DELAYED_RELEASE_TABLET | Freq: Two times a day (BID) | ORAL | 5 refills | Status: DC

## 2022-01-30 NOTE — Patient Instructions (Signed)
For your chronic hepatitis C, I am going to check more blood work today to be performed at [facility] labs in anticipation of obtaining treatment.  I am also going to order an ultrasound to be performed at [facility]. ? ?For your chronic reflux, we need to schedule you for upper endoscopy to further evaluate.  At the same time I will perform colonoscopy to evaluate your diarrhea. ? ?I am going to send in pantoprazole 40 mg twice daily to take for your chronic reflux. ? ?I am also going to request your records from [place]. ? ?Further recommendations to follow. ? ?It was nice meeting you today. ? ?Dr. Abbey Chatters ? ?At [facility] we value your feedback. You may receive a survey about your visit today. Please share your experience as we strive to create trusting relationships with our patients to provide genuine, compassionate, quality care. ? ?We appreciate your understanding and patience as we review any laboratory studies, imaging, and other diagnostic tests that are ordered as we care for you. Our office policy is 5 business days for review of these results, and any emergent or urgent results are addressed in a timely manner for your best interest. If you do not hear from our office in 1 week, please contact us.  ? ?We also encourage the use of MyChart, which contains your medical information for your review as well. If you are not enrolled in this feature, an access code is on this after visit summary for your convenience. Thank you for allowing [place] to be involved in your care. ? ?It was great to see you today!  I hope you have a great rest of your Spring! ? ? ? ?Elon Alas. Abbey Chatters, D.O. ?Gastroenterology and Hepatology ?[facility] ? ?

## 2022-01-30 NOTE — Progress Notes (Signed)
? ? ?Primary Care Physician:  Waldon ReiningBrowning, Douglas, MD ?Primary Gastroenterologist:  Dr. Marletta Lorarver ? ?Chief Complaint  ?Patient presents with  ? Anemia  ?  Pt complains of diarrhea and acid reflux. Stated he didn't know about anemia  ? ? ?HPI:   ?Curtis Hartman is a 61 y.o. male who presents to clinic today by referral from his nephrologist Dr. Wolfgang PhoenixBhutani for evaluation.  Recently had blood work done which showed positive hepatitis C antibody.  Viral load 2.4 million.  Patient states this was news to him.  He is unsure how he contracted hepatitis C.  No history of illicit drug use.  Does note that he "went out" with a couple of girls and had hepatitis C years ago.  No blood transfusions.  No family history of liver disease or hepatitis C. ? ?Also with chronic GERD which is not well controlled.  Previously on omeprazole 20 mg daily.  States for the last month he has had severe acid reflux especially at night.  Has vomited multiple times overnight due to this.  Notes epigastric pain as well.  No dysphagia/odynophagia.  No melena hematochezia. ? ?Does note chronic diarrhea for over 2 years.  States [place] and [place] found a blockage?  States he was admitted in the ICU for 2 weeks due to GI issues.  Unclear if he has had polyps before not. ? ?Past Medical History:  ?Diagnosis Date  ? CKD (chronic kidney disease)   ? Diabetes mellitus without complication (HCC)   ? Hypertension   ? ? ?Past Surgical History:  ?Procedure Laterality Date  ? foot right partial toe amputation Right   ? FOOT SURGERY    ? hernia surgery x4    ? ? ?Current Outpatient Medications  ?Medication Sig Dispense Refill  ? amLODipine (NORVASC) 10 MG tablet Take 10 mg by mouth daily.    ? atorvastatin (LIPITOR) 20 MG tablet Take 20 mg by mouth daily.    ? cholecalciferol (VITAMIN D3) 25 MCG (1000 UNIT) tablet Take 1,000 Units by mouth daily.    ? gabapentin (NEURONTIN) 100 MG capsule Take 100 mg by mouth 5 (five) times  daily as needed (pain).    ? insulin aspart (NOVOLOG) 100 UNIT/ML injection Inject 2-10 Units into the skin 3 (three) times daily as needed (blood sugar over 150).    ? LANTUS 100 UNIT/ML injection Inject 10 Units into the skin at bedtime.    ? losartan (COZAAR) 25 MG tablet Take 12.5 mg by mouth daily.    ? sodium bicarbonate 650 MG tablet Take 650 mg by mouth 3 (three) times daily.    ? ?No current facility-administered medications for this visit.  ? ? ?Allergies as of 01/30/2022  ? (No Known Allergies)  ? ? ?Family History  ?Problem Relation Age of Onset  ? Heart disease Mother   ? Seizures Father   ? Stroke Maternal Grandfather   ? Liver disease Paternal Grandmother   ? ? ?Social History  ? ?Socioeconomic History  ? Marital status: Single  ?  Spouse name: Not on file  ? Number of children: Not on file  ? Years of education: Not on file  ? Highest education level: Not on file  ?Occupational History  ? Not on file  ?Tobacco Use  ? Smoking status: Every Day  ?  Types: Cigarettes  ?  Passive exposure: Never  ? Smokeless tobacco: Never  ?Vaping Use  ?  Vaping Use: Never used  ?Substance and Sexual Activity  ? Alcohol use: Not Currently  ? Drug use: Not Currently  ? Sexual activity: Not on file  ?Other Topics Concern  ? Not on file  ?Social History Narrative  ? Not on file  ? ?Social Determinants of Health  ? ?Financial Resource Strain: Not on file  ?Food Insecurity: Not on file  ?Transportation Needs: Not on file  ?Physical Activity: Not on file  ?Stress: Not on file  ?Social Connections: Not on file  ?Intimate Partner Violence: Not on file  ? ? ?Subjective: ?Review of Systems  ?Constitutional:  Negative for chills and fever.  ?HENT:  Negative for congestion and hearing loss.   ?Eyes:  Negative for blurred vision and double vision.  ?Respiratory:  Negative for cough and shortness of breath.   ?Cardiovascular:  Negative for chest pain and palpitations.  ?Gastrointestinal:  Positive for diarrhea and heartburn. Negative  for abdominal pain, blood in stool, constipation, melena and vomiting.  ?Genitourinary:  Negative for dysuria and urgency.  ?Musculoskeletal:  Negative for joint pain and myalgias.  ?Skin:  Negative for itching and rash.  ?Neurological:  Negative for dizziness and headaches.  ?Psychiatric/Behavioral:  Negative for depression. The patient is not nervous/anxious.    ? ? ? ?Objective: ?BP (!) 110/58   Pulse 84   Temp 97.7 ?F (36.5 ?C)   Ht 5\' 7"  (1.702 m)   Wt 156 lb 9.6 oz (71 kg)   BMI 24.53 kg/m?  ?Physical Exam ?Constitutional:   ?   Appearance: Normal appearance.  ?HENT:  ?   Head: Normocephalic and atraumatic.  ?Eyes:  ?   Extraocular Movements: Extraocular movements intact.  ?   Conjunctiva/sclera: Conjunctivae normal.  ?Cardiovascular:  ?   Rate and Rhythm: Normal rate and regular rhythm.  ?Pulmonary:  ?   Effort: Pulmonary effort is normal.  ?   Breath sounds: Normal breath sounds.  ?Abdominal:  ?   General: Bowel sounds are normal.  ?   Palpations: Abdomen is soft.  ?Musculoskeletal:     ?   General: Normal range of motion.  ?   Cervical back: Normal range of motion and neck supple.  ?Skin: ?   General: Skin is warm.  ?Neurological:  ?   General: No focal deficit present.  ?   Mental Status: He is alert and oriented to person, place, and time.  ?Psychiatric:     ?   Mood and Affect: Mood normal.     ?   Behavior: Behavior normal.  ? ? ? ?Assessment: ?*Chronic hepatitis C ?*Chronic GERD-not well controlled ?*Lower abdominal pain ?*Diarrhea ? ?Plan: ?Discussed chronic hepatitis C in depth with patient today including treatment options.  Viral load 2.4 million.  Hepatitis C antibody positive.  We will check genotype today.  We will also check hepatitis a and B serologies. ? ?Right upper quadrant ultrasound with elastography ordered. ? ?GERD not well controlled.  Will send in prescription for pantoprazole 40 mg twice daily. ? ?Will schedule for EGD to evaluate for peptic ulcer disease, esophagitis,  gastritis, H. Pylori, duodenitis, or other. Will also evaluate for esophageal stricture, Schatzki's ring, esophageal web or other.  ? ?At the same time we will perform colonoscopy with random colon biopsies to evaluate his chronic diarrhea as well as reported history of blockage, ?stricture ? ?The risks including infection, bleed, or perforation as well as benefits, limitations, alternatives and imponderables have been reviewed with the patient. Potential for esophageal  dilation, biopsy, etc. have also been reviewed.  Questions have been answered. All parties agreeable. ? ?I will request records from hospital in [place]. ? ?Thank you Dr. Wolfgang Phoenix for the kind referral. ? ?01/30/2022 2:29 PM ? ? ?Disclaimer: This note was dictated with voice recognition software. Similar sounding words can inadvertently be transcribed and may not be corrected upon review. ? ?

## 2022-02-05 ENCOUNTER — Telehealth: Payer: Self-pay

## 2022-02-05 ENCOUNTER — Other Ambulatory Visit: Payer: Self-pay

## 2022-02-05 MED ORDER — CLENPIQ 10-3.5-12 MG-GM -GM/160ML PO SOLN: 1.0000 | Freq: Once | ORAL | 0 refills | Status: AC

## 2022-02-05 NOTE — Telephone Encounter (Signed)
Called pt, TCS/EGD ASA 3 w/Dr. Marletta Lor scheduled for 03/03/22 at 8:00am. Rx for prep sent to pharmacy. Orders entered.  ?

## 2022-02-06 NOTE — Telephone Encounter (Signed)
Pre-op appt 02/26/22. Appt letter mailed with procedure instructions. ?

## 2022-02-11 ENCOUNTER — Ambulatory Visit (HOSPITAL_COMMUNITY)
Admission: RE | Admit: 2022-02-11 | Discharge: 2022-02-11 | Disposition: A | Discharge status: Home / Self Care | Payer: Medicaid Other | Source: Ambulatory Visit | Attending: Internal Medicine | Admitting: Internal Medicine

## 2022-02-11 DIAGNOSIS — B1920 Unspecified viral hepatitis C without hepatic coma: Secondary | ICD-10-CM | POA: Diagnosis present

## 2022-02-14 LAB — HEPATIC FUNCTION PANEL
AG Ratio: 1.4 (calc) (ref 1.0–2.5)
ALT: 13 U/L (ref 9–46)
AST: 14 U/L (ref 10–35)
Albumin: 4 g/dL (ref 3.6–5.1)
Alkaline phosphatase (APISO): 65 U/L (ref 35–144)
Bilirubin, Direct: 0.1 mg/dL (ref 0.0–0.2)
Globulin: 2.8 g/dL (calc) (ref 1.9–3.7)
Indirect Bilirubin: 0.4 mg/dL (calc) (ref 0.2–1.2)
Total Bilirubin: 0.5 mg/dL (ref 0.2–1.2)
Total Protein: 6.8 g/dL (ref 6.1–8.1)

## 2022-02-14 LAB — HEPATITIS B CORE ANTIBODY, TOTAL: Hep B Core Total Ab: NONREACTIVE

## 2022-02-14 LAB — HEPATITIS B SURFACE ANTIBODY,QUALITATIVE: Hep B S Ab: NONREACTIVE

## 2022-02-14 LAB — HEPATITIS A ANTIBODY, TOTAL: Hepatitis A AB,Total: NONREACTIVE

## 2022-02-14 LAB — HEPATITIS B SURFACE ANTIGEN: Hepatitis B Surface Ag: NONREACTIVE

## 2022-02-14 LAB — HEPATITIS C GENOTYPE: HCV Genotype: 2

## 2022-02-25 NOTE — Patient Instructions (Signed)
? ? ? ? ? ? ? ? Curtis Hartman ? 02/25/2022  ?  ? @PREFPERIOPPHARMACY @ ? ? Your procedure is scheduled on  03/03/2022. ? ? Report to [facility] at  [place] A.[facility]. ? ? Call this number if you have problems the morning of surgery: ? [phone] ? ? Remember: ? Follow the diet and prep instructions given to you by the office. ? ?  DO NOT take any medications for diabetes the morning of your procedure. ?  ? Take these medicines the morning of surgery with A SIP OF WATER  ? ?                                          amlodipine. ?  ? ? Do not wear jewelry, make-up or nail polish. ? Do not wear lotions, powders, or perfumes, or deodorant. ? Do not shave 48 hours prior to surgery.  Men may shave face and neck. ? Do not bring valuables to the hospital. ? [facility] is not responsible for any belongings or valuables. ? ?Contacts, dentures or bridgework may not be worn into surgery.  Leave your suitcase in the car.  After surgery it may be brought to your room. ? ?For patients admitted to the hospital, discharge time will be determined by your treatment team. ? ?Patients discharged the day of surgery will not be allowed to drive home and must have someone with them for 24 hours.  ? ? ?Special instructions:   DO NOT smoke tobacco or vape for 24 hours before your procedure. ? ?Please read over the following fact sheets that you were given. ?Anesthesia Post-op Instructions and Care and Recovery After Surgery ?  ? ? ? Upper Endoscopy, Adult, Care After ?This sheet gives you information about how to care for yourself after your procedure. Your health care provider may also give you more specific instructions. If you have problems or questions, contact your health care provider. ?What can I expect after the procedure? ?After the procedure, it is common to have: ?A sore throat. ?Mild stomach pain or discomfort. ?Bloating. ?Nausea. ?Follow these instructions at home: ? ?Follow instructions from your health care provider about what to eat  or drink after your procedure. ?Return to your normal activities as told by your health care provider. Ask your health care provider what activities are safe for you. ?Take over-the-counter and prescription medicines only as told by your health care provider. ?If you were given a sedative during the procedure, it can affect you for several hours. Do not drive or operate machinery until your health care provider says that it is safe. ?Keep all follow-up visits as told by your health care provider. This is important. ?Contact a health care provider if you have: ?A sore throat that lasts longer than one day. ?Trouble swallowing. ?Get help right away if: ?You vomit blood or your vomit looks like coffee grounds. ?You have: ?A fever. ?Bloody, black, or tarry stools. ?A severe sore throat or you cannot swallow. ?Difficulty breathing. ?Severe pain in your chest or abdomen. ?Summary ?After the procedure, it is common to have a sore throat, mild stomach discomfort, bloating, and nausea. ?If you were given a sedative during the procedure, it can affect you for several hours. Do not drive or operate machinery until your health care provider says that it is safe. ?Follow instructions from your health care provider  about what to eat or drink after your procedure. ?Return to your normal activities as told by your health care provider. ?This information is not intended to replace advice given to you by your health care provider. Make sure you discuss any questions you have with your health care provider. ?Document Revised: 08/26/2019 Document Reviewed: 03/22/2018 ?Elsevier Patient Education ? [place]. ?Colonoscopy, Adult, Care After ?The following information offers guidance on how to care for yourself after your procedure. Your health care provider may also give you more specific instructions. If you have problems or questions, contact your health care provider. ?What can I expect after the procedure? ?After the  procedure, it is common to have: ?A small amount of blood in your stool for 24 hours after the procedure. ?Some gas. ?Mild cramping or bloating of your abdomen. ?Follow these instructions at home: ?Eating and drinking ? ?Drink enough fluid to keep your urine pale yellow. ?Follow instructions from your health care provider about eating or drinking restrictions. ?Resume your normal diet as told by your health care provider. Avoid heavy or fried foods that are hard to digest. ?Activity ?Rest as told by your health care provider. ?Avoid sitting for a long time without moving. Get up to take short walks every 1-2 hours. This is important to improve blood flow and breathing. Ask for help if you feel weak or unsteady. ?Return to your normal activities as told by your health care provider. Ask your health care provider what activities are safe for you. ?Managing cramping and bloating ? ?Try walking around when you have cramps or feel bloated. ?If directed, apply heat to your abdomen as told by your health care provider. Use the heat source that your health care provider recommends, such as a moist heat pack or a heating pad. ?Place a towel between your skin and the heat source. ?Leave the heat on for 20-30 minutes. ?Remove the heat if your skin turns bright red. This is especially important if you are unable to feel pain, heat, or cold. You have a greater risk of getting burned. ?General instructions ?If you were given a sedative during the procedure, it can affect you for several hours. Do not drive or operate machinery until your health care provider says that it is safe. ?For the first 24 hours after the procedure: ?Do not sign important documents. ?Do not drink alcohol. ?Do your regular daily activities at a slower pace than normal. ?Eat soft foods that are easy to digest. ?Take over-the-counter and prescription medicines only as told by your health care provider. ?Keep all follow-up visits. This is important. ?Contact  a health care provider if: ?You have blood in your stool 2-3 days after the procedure. ?Get help right away if: ?You have more than a small spotting of blood in your stool. ?You have large blood clots in your stool. ?You have swelling of your abdomen. ?You have nausea or vomiting. ?You have a fever. ?You have increasing pain in your abdomen that is not relieved with medicine. ?These symptoms may be an emergency. Get help right away. Call 911. ?Do not wait to see if the symptoms will go away. ?Do not drive yourself to the hospital. ?Summary ?After the procedure, it is common to have a small amount of blood in your stool. You may also have mild cramping and bloating of your abdomen. ?If you were given a sedative during the procedure, it can affect you for several hours. Do not drive or operate machinery until your  health care provider says that it is safe. ?Get help right away if you have a lot of blood in your stool, nausea or vomiting, a fever, or increased pain in your abdomen. ?This information is not intended to replace advice given to you by your health care provider. Make sure you discuss any questions you have with your health care provider. ?Document Revised: 06/12/2021 Document Reviewed: 06/12/2021 ?Elsevier Patient Education ? [place]. ?Monitored Anesthesia Care, Care After ?This sheet gives you information about how to care for yourself after your procedure. Your health care provider may also give you more specific instructions. If you have problems or questions, contact your health care provider. ?What can I expect after the procedure? ?After the procedure, it is common to have: ?Tiredness. ?Forgetfulness about what happened after the procedure. ?Impaired judgment for important decisions. ?Nausea or vomiting. ?Some difficulty with balance. ?Follow these instructions at home: ?For the time period you were told by your health care provider: ? ?  ? ?Rest as needed. ?Do not participate in activities  where you could fall or become injured. ?Do not drive or use machinery. ?Do not drink alcohol. ?Do not take sleeping pills or medicines that cause drowsiness. ?Do not make important decisions or sign legal

## 2022-02-26 ENCOUNTER — Encounter (HOSPITAL_COMMUNITY)
Admission: RE | Admit: 2022-02-26 | Discharge: 2022-02-26 | Disposition: A | Discharge status: Home / Self Care | Payer: Medicaid Other | Source: Ambulatory Visit | Attending: Internal Medicine | Admitting: Internal Medicine

## 2022-02-26 VITALS — BP 140/84 | HR 72 | Temp 98.4°F | Resp 18 | Ht 69.0 in | Wt 168.0 lb

## 2022-02-26 DIAGNOSIS — E119 Type 2 diabetes mellitus without complications: Secondary | ICD-10-CM | POA: Diagnosis not present

## 2022-02-26 DIAGNOSIS — Z01818 Encounter for other preprocedural examination: Secondary | ICD-10-CM | POA: Diagnosis present

## 2022-02-26 LAB — BASIC METABOLIC PANEL
Anion gap: 6 (ref 5–15)
BUN: 48 mg/dL — ABNORMAL HIGH (ref 8–23)
CO2: 28 mmol/L (ref 22–32)
Calcium: 9.4 mg/dL (ref 8.9–10.3)
Chloride: 106 mmol/L (ref 98–111)
Creatinine, Ser: 1.87 mg/dL — ABNORMAL HIGH (ref 0.61–1.24)
GFR, Estimated: 40 mL/min — ABNORMAL LOW (ref >60)
Glucose, Bld: 177 mg/dL — ABNORMAL HIGH (ref 70–99)
Potassium: 4.5 mmol/L (ref 3.5–5.1)
Sodium: 140 mmol/L (ref 135–145)

## 2022-03-03 ENCOUNTER — Ambulatory Visit (HOSPITAL_COMMUNITY): Payer: Medicaid Other | Admitting: Certified Registered Nurse Anesthetist

## 2022-03-03 ENCOUNTER — Ambulatory Visit (HOSPITAL_BASED_OUTPATIENT_CLINIC_OR_DEPARTMENT_OTHER): Payer: Medicaid Other | Admitting: Certified Registered Nurse Anesthetist

## 2022-03-03 ENCOUNTER — Telehealth: Payer: Self-pay | Admitting: *Deleted

## 2022-03-03 ENCOUNTER — Encounter (HOSPITAL_COMMUNITY): Payer: Self-pay

## 2022-03-03 ENCOUNTER — Telehealth: Payer: Self-pay | Admitting: Internal Medicine

## 2022-03-03 ENCOUNTER — Encounter (HOSPITAL_COMMUNITY)
Admission: RE | Disposition: A | Discharge status: Home / Self Care | Payer: Self-pay | Source: Home / Self Care | Attending: Internal Medicine

## 2022-03-03 ENCOUNTER — Ambulatory Visit (HOSPITAL_COMMUNITY)
Admission: RE | Admit: 2022-03-03 | Discharge: 2022-03-03 | Disposition: A | Discharge status: Home / Self Care | Payer: Medicaid Other | Attending: Internal Medicine | Admitting: Internal Medicine

## 2022-03-03 DIAGNOSIS — K648 Other hemorrhoids: Secondary | ICD-10-CM

## 2022-03-03 DIAGNOSIS — K2970 Gastritis, unspecified, without bleeding: Secondary | ICD-10-CM | POA: Diagnosis not present

## 2022-03-03 DIAGNOSIS — Z794 Long term (current) use of insulin: Secondary | ICD-10-CM | POA: Diagnosis not present

## 2022-03-03 DIAGNOSIS — E1122 Type 2 diabetes mellitus with diabetic chronic kidney disease: Secondary | ICD-10-CM | POA: Insufficient documentation

## 2022-03-03 DIAGNOSIS — F17210 Nicotine dependence, cigarettes, uncomplicated: Secondary | ICD-10-CM | POA: Diagnosis not present

## 2022-03-03 DIAGNOSIS — I129 Hypertensive chronic kidney disease with stage 1 through stage 4 chronic kidney disease, or unspecified chronic kidney disease: Secondary | ICD-10-CM | POA: Insufficient documentation

## 2022-03-03 DIAGNOSIS — K529 Noninfective gastroenteritis and colitis, unspecified: Secondary | ICD-10-CM | POA: Diagnosis not present

## 2022-03-03 DIAGNOSIS — K3189 Other diseases of stomach and duodenum: Secondary | ICD-10-CM | POA: Diagnosis not present

## 2022-03-03 DIAGNOSIS — K219 Gastro-esophageal reflux disease without esophagitis: Secondary | ICD-10-CM | POA: Insufficient documentation

## 2022-03-03 DIAGNOSIS — K635 Polyp of colon: Secondary | ICD-10-CM

## 2022-03-03 DIAGNOSIS — N189 Chronic kidney disease, unspecified: Secondary | ICD-10-CM | POA: Insufficient documentation

## 2022-03-03 HISTORY — PX: POLYPECTOMY: SHX5525

## 2022-03-03 HISTORY — PX: ESOPHAGOGASTRODUODENOSCOPY (EGD) WITH PROPOFOL: SHX5813

## 2022-03-03 HISTORY — PX: COLONOSCOPY WITH PROPOFOL: SHX5780

## 2022-03-03 HISTORY — PX: BIOPSY: SHX5522

## 2022-03-03 LAB — GLUCOSE, CAPILLARY: Glucose-Capillary: 178 mg/dL — ABNORMAL HIGH (ref 70–99)

## 2022-03-03 SURGERY — COLONOSCOPY WITH PROPOFOL
Anesthesia: General

## 2022-03-03 MED ORDER — LIDOCAINE HCL (CARDIAC) PF 100 MG/5ML IV SOSY
PREFILLED_SYRINGE | INTRAVENOUS | Status: DC | PRN
  Administered 2022-03-03: 50 mg via INTRAVENOUS

## 2022-03-03 MED ORDER — PROPOFOL 10 MG/ML IV BOLUS
INTRAVENOUS | Status: DC | PRN
  Administered 2022-03-03: 100 mg via INTRAVENOUS

## 2022-03-03 MED ORDER — PROPOFOL 500 MG/50ML IV EMUL
INTRAVENOUS | Status: DC | PRN
  Administered 2022-03-03: 150 ug/kg/min via INTRAVENOUS

## 2022-03-03 MED ORDER — LACTATED RINGERS IV SOLN: INTRAVENOUS | Status: DC

## 2022-03-03 NOTE — Anesthesia Postprocedure Evaluation (Signed)
Anesthesia Post Note ? ?Patient: TRISTEN ARRONA ? ?Procedure(s) Performed: COLONOSCOPY WITH PROPOFOL ?ESOPHAGOGASTRODUODENOSCOPY (EGD) WITH PROPOFOL ?BIOPSY ?POLYPECTOMY ? ?Patient location during evaluation: Phase II ?Anesthesia Type: General ?Level of consciousness: awake ?Pain management: pain level controlled ?Vital Signs Assessment: post-procedure vital signs reviewed and stable ?Respiratory status: spontaneous breathing and respiratory function stable ?Cardiovascular status: blood pressure returned to baseline and stable ?Postop Assessment: no headache and no apparent nausea or vomiting ?Anesthetic complications: no ?Comments: Late entry ? ? ?No notable events documented. ? ? ?Last Vitals:  ?Vitals:  ? 03/03/22 0800 03/03/22 0840  ?BP: (!) 148/90 127/71  ?Pulse: 82 80  ?Resp: 18 18  ?Temp: 37 ?C 36.5 ?C  ?SpO2: 97% 100%  ?  ?Last Pain:  ?Vitals:  ? 03/03/22 0840  ?TempSrc: Oral  ?PainSc: 0-No pain  ? ? ?  ?  ?  ?  ?  ?  ? ?Louann Sjogren ? ? ? ? ?

## 2022-03-03 NOTE — Op Note (Signed)
[facility] ?Patient Name: Curtis Hartman ?Procedure Date: 03/03/2022 8:24 AM ?MRN: [id number] ?Date of Birth: 1960/12/12 ?Attending MD: Elon Alas. Abbey Chatters , DO ?CSN: [id number] ?Age: 61 ?Admit Type: Outpatient ?Procedure:                Colonoscopy ?Indications:              Chronic diarrhea ?Providers:                Elon Alas. Abbey Chatters, DO, Tammy Vaught, RN, Crisann  ?                          Wynonia Lawman, Technician ?Referring MD:              ?Medicines:                See the Anesthesia note for documentation of the  ?                          administered medications ?Complications:            No immediate complications. ?Estimated Blood Loss:     Estimated blood loss was minimal. ?Procedure:                Pre-Anesthesia Assessment: ?                          - The anesthesia plan was to use monitored  ?                          anesthesia care (MAC). ?                          After obtaining informed consent, the colonoscope  ?                          was passed under direct vision. Throughout the  ?                          procedure, the patient's blood pressure, pulse, and  ?                          oxygen saturations were monitored continuously. The  ?                          PCF-HQ190L ([id number]) scope was introduced through  ?                          the anus and advanced to the the terminal ileum,  ?                          with identification of the appendiceal orifice and  ?                          IC valve. The colonoscopy was performed without  ?                          difficulty. The patient tolerated the procedure  ?  well. The quality of the bowel preparation was  ?                          evaluated using the BBPS [facility] Bowel Preparation  ?                          Scale) with scores of: Right Colon = 3, Transverse  ?                          Colon = 3 and Left Colon = 3 (entire mucosa seen  ?                          well with no residual staining, small  fragments of  ?                          stool or opaque liquid). The total BBPS score  ?                          equals 9. ?Scope In: 8:24:18 AM ?Scope Out: 8:36:04 AM ?Scope Withdrawal Time: 0 hours 9 minutes 5 seconds  ?Total Procedure Duration: 0 hours 11 minutes 46 seconds  ?Findings: ?     The perianal and digital rectal examinations were normal. ?     Non-bleeding internal hemorrhoids were found during endoscopy. ?     A 6 mm polyp was found in the transverse colon. The polyp was sessile.  ?     The polyp was removed with a cold snare. Resection and retrieval were  ?     complete. ?     A 5 mm polyp was found in the sigmoid colon. The polyp was sessile. The  ?     polyp was removed with a cold snare. Resection and retrieval were  ?     complete. ?     Biopsies for histology were taken with a cold forceps from the ascending  ?     colon, transverse colon and descending colon for evaluation of  ?     microscopic colitis. ?     The terminal ileum appeared normal. ?Impression:               - Non-bleeding internal hemorrhoids. ?                          - One 6 mm polyp in the transverse colon, removed  ?                          with a cold snare. Resected and retrieved. ?                          - One 5 mm polyp in the sigmoid colon, removed with  ?                          a cold snare. Resected and retrieved. ?                          - The examined portion of the ileum  was normal. ?                          - Biopsies were taken with a cold forceps from the  ?                          ascending colon, transverse colon and descending  ?                          colon for evaluation of microscopic colitis. ?Moderate Sedation: ?     Per Anesthesia Care ?Recommendation:           - Patient has a contact number available for  ?                          emergencies. The signs and symptoms of potential  ?                          delayed complications were discussed with the  ?                          patient.  Return to normal activities tomorrow.  ?                          Written discharge instructions were provided to the  ?                          patient. ?                          - Resume previous diet. ?                          - Continue present medications. ?                          - Await pathology results. ?                          - Repeat colonoscopy in 5 years for surveillance. ?Procedure Code(s):        --- Professional --- ?                          [id number], Colonoscopy, flexible; with removal of  ?                          tumor(s), polyp(s), or other lesion(s) by snare  ?                          technique ?                          45380, 59, Colonoscopy, flexible; with biopsy,  ?                          single or multiple ?Diagnosis Code(s):        --- Professional --- ?  K63.5, Polyp of colon ?                          K64.8, Other hemorrhoids ?                          K52.9, Noninfective gastroenteritis and colitis,  ?                          unspecified ?CPT copyright 2019 American Medical Association. All rights reserved. ?The codes documented in this report are preliminary and upon coder review may  ?be revised to meet current compliance requirements. ?Elon Alas. Abbey Chatters, DO ?Elon Alas. [place], DO ?03/03/2022 8:42:24 AM ?This report has been signed electronically. ?Number of Addenda: 0 ?

## 2022-03-03 NOTE — Anesthesia Preprocedure Evaluation (Addendum)
Anesthesia Evaluation  ?Patient identified by MRN, date of birth, ID band ?Patient awake ? ? ? ?Reviewed: ?Allergy & Precautions, H&P , NPO status , Patient's Chart, lab work & pertinent test results, reviewed documented beta blocker date and time  ? ?Airway ?Mallampati: II ? ?TM Distance: >3 FB ?Neck ROM: full ? ? ? Dental ? ?(+) Missing, Poor Dentition ?  ?Pulmonary ?neg pulmonary ROS, Current Smoker and Patient abstained from smoking.,  ?  ?Pulmonary exam normal ?breath sounds clear to auscultation ? ? ? ? ? ? Cardiovascular ?Exercise Tolerance: Good ?hypertension, negative cardio ROS ? ? ?Rhythm:regular Rate:Normal ? ? ?  ?Neuro/Psych ?negative neurological ROS ? negative psych ROS  ? GI/Hepatic ?negative GI ROS, Neg liver ROS,   ?Endo/Other  ?negative endocrine ROSdiabetes, Type 2 ? Renal/GU ?CRFRenal disease  ?negative genitourinary ?  ?Musculoskeletal ? ? Abdominal ?  ?Peds ? Hematology ?negative hematology ROS ?(+)   ?Anesthesia Other Findings ? ? Reproductive/Obstetrics ?negative OB ROS ? ?  ? ? ? ? ? ? ? ? ? ? ? ? ? ?  ?  ? ? ? ? ? ? ? ?Anesthesia Physical ?Anesthesia Plan ? ?ASA: 2 ? ?Anesthesia Plan: General  ? ?Post-op Pain Management:   ? ?Induction:  ? ?PONV Risk Score and Plan: Propofol infusion ? ?Airway Management Planned:  ? ?Additional Equipment:  ? ?Intra-op Plan:  ? ?Post-operative Plan:  ? ?Informed Consent: I have reviewed the patients History and Physical, chart, labs and discussed the procedure including the risks, benefits and alternatives for the proposed anesthesia with the patient or authorized representative who has indicated his/her understanding and acceptance.  ? ? ? ?Dental Advisory Given ? ?Plan Discussed with: CRNA ? ?Anesthesia Plan Comments:   ? ? ? ? ? ? ?Anesthesia Quick Evaluation ? ?

## 2022-03-03 NOTE — H&P (Signed)
Primary Care Physician:  Waldon Reining, MD ?Primary Gastroenterologist:  Dr. Marletta Lor ? ?Pre-Procedure History & Physical: ?HPI:  Curtis Hartman is a 61 y.o. male is here for an EGD for chronic GERD and colonoscopy to be performed for chronic diarrhea, ?colon stricture. ? ?Past Medical History:  ?Diagnosis Date  ? CKD (chronic kidney disease)   ? Diabetes mellitus without complication (HCC)   ? Hypertension   ? ? ?Past Surgical History:  ?Procedure Laterality Date  ? foot right partial toe amputation Right   ? FOOT SURGERY    ? hernia surgery x4    ? ? ?Prior to Admission medications   ?Medication Sig Start Date End Date Taking? Authorizing Provider  ?amLODipine (NORVASC) 10 MG tablet Take 10 mg by mouth daily. 07/26/21  Yes [provider]  ?insulin aspart (NOVOLOG) 100 UNIT/ML injection Inject 2-10 Units into the skin 3 (three) times daily as needed (blood sugar over 150).   Yes [provider]  ?LANTUS 100 UNIT/ML injection Inject 5 Units into the skin at bedtime as needed (blood sugar over 120). 03/05/21  Yes [provider]  ?Multiple Vitamin (MULTIVITAMIN WITH MINERALS) TABS tablet Take 1 tablet by mouth daily.   Yes [provider]  ?atorvastatin (LIPITOR) 20 MG tablet Take 20 mg by mouth daily. ?Patient not taking: Reported on 02/21/2022 06/12/21   [provider]  ?pantoprazole (PROTONIX) 40 MG tablet Take 1 tablet (40 mg total) by mouth 2 (two) times daily. ?Patient not taking: Reported on 02/21/2022 01/30/22 07/29/22  Lanelle Bal, DO  ? ? ?Allergies as of 02/05/2022  ? (No Known Allergies)  ? ? ?Family History  ?Problem Relation Age of Onset  ? Heart disease Mother   ? Seizures Father   ? Stroke Maternal Grandfather   ? Liver disease Paternal Grandmother   ? ? ?Social History  ? ?Socioeconomic History  ? Marital status: Single  ?  Spouse name: Not on file  ? Number of children: Not on file  ? Years of education: Not on file  ? Highest education level: Not on  file  ?Occupational History  ? Not on file  ?Tobacco Use  ? Smoking status: Every Day  ?  Types: Cigarettes  ?  Passive exposure: Never  ? Smokeless tobacco: Never  ?Vaping Use  ? Vaping Use: Never used  ?Substance and Sexual Activity  ? Alcohol use: Not Currently  ? Drug use: Not Currently  ? Sexual activity: Not on file  ?Other Topics Concern  ? Not on file  ?Social History Narrative  ? Not on file  ? ?Social Determinants of Health  ? ?Financial Resource Strain: Not on file  ?Food Insecurity: Not on file  ?Transportation Needs: Not on file  ?Physical Activity: Not on file  ?Stress: Not on file  ?Social Connections: Not on file  ?Intimate Partner Violence: Not on file  ? ? ?Review of Systems: ?See HPI, otherwise negative ROS ? ?Physical Exam: ?Vital signs in last 24 hours: ?Temp:  [98.6 ?F (37 ?C)] 98.6 ?F (37 ?C) (05/01 0800) ?Pulse Rate:  [82] 82 (05/01 0800) ?Resp:  [18] 18 (05/01 0800) ?BP: (148)/(90) 148/90 (05/01 0800) ?SpO2:  [97 %] 97 % (05/01 0800) ?  ?General:   Alert,  Well-developed, well-nourished, pleasant and cooperative in NAD ?Head:  Normocephalic and atraumatic. ?Eyes:  Sclera clear, no icterus.   Conjunctiva pink. ?Ears:  Normal auditory acuity. ?Nose:  No deformity, discharge,  or lesions. ?Mouth:  No deformity  or lesions, dentition normal. ?Neck:  Supple; no masses or thyromegaly. ?Lungs:  Clear throughout to auscultation.   No wheezes, crackles, or rhonchi. No acute distress. ?Heart:  Regular rate and rhythm; no murmurs, clicks, rubs,  or gallops. ?Abdomen:  Soft, nontender and nondistended. No masses, hepatosplenomegaly or hernias noted. Normal bowel sounds, without guarding, and without rebound.   ?Msk:  Symmetrical without gross deformities. Normal posture. ?Extremities:  Without clubbing or edema. ?Neurologic:  Alert and  oriented x4;  grossly normal neurologically. ?Skin:  Intact without significant lesions or rashes. ?Cervical Nodes:  No significant cervical adenopathy. ?Psych:  Alert  and cooperative. Normal mood and affect. ? ?Impression/Plan: ?Curtis Hartman is here for an EGD for chronic GERD and colonoscopy to be performed for chronic diarrhea, ?colon stricture. ? ? ?The risks of the procedure including infection, bleed, or perforation as well as benefits, limitations, alternatives and imponderables have been reviewed with the patient. Questions have been answered. All parties agreeable. ? ? ?

## 2022-03-03 NOTE — Op Note (Signed)
[facility] ?Patient Name: Curtis Hartman ?Procedure Date: 03/03/2022 8:07 AM ?MRN: [id number] ?Date of Birth: [birth year] ?Attending MD: Elon Alas. Abbey Chatters , DO ?CSN: [id number] ?Age: 61 ?Admit Type: Outpatient ?Procedure:                Upper GI endoscopy ?Indications:              Heartburn ?Providers:                Elon Alas. Abbey Chatters, DO, Tammy Vaught, RN, Crisann  ?                          Wynonia Lawman, Technician ?Referring MD:              ?Medicines:                See the Anesthesia note for documentation of the  ?                          administered medications ?Complications:            No immediate complications. ?Estimated Blood Loss:     Estimated blood loss was minimal. ?Procedure:                Pre-Anesthesia Assessment: ?                          - The anesthesia plan was to use monitored  ?                          anesthesia care (MAC). ?                          After obtaining informed consent, the endoscope was  ?                          passed under direct vision. Throughout the  ?                          procedure, the patient's blood pressure, pulse, and  ?                          oxygen saturations were monitored continuously. The  ?                          GIF-H190 ([id number]) scope was introduced through the  ?                          mouth, and advanced to the second part of duodenum.  ?                          The upper GI endoscopy was accomplished without  ?                          difficulty. The patient tolerated the procedure  ?                          well. ?Scope In: 8:16:51 AM ?Scope Out:  8:20:38 AM ?Total Procedure Duration: 0 hours 3 minutes 47 seconds  ?Findings: ?     The Z-line was regular and was found 39 cm from the incisors. ?     There is no endoscopic evidence of areas of erosion, esophagitis, hiatal  ?     hernia, ulcerations or varices in the entire esophagus. ?     Patchy moderate inflammation characterized by erosions and erythema was  ?     found in the  entire examined stomach. Biopsies were taken with a cold  ?     forceps for Helicobacter pylori testing. ?     Localized mildly erythematous mucosa without active bleeding and with no  ?     stigmata of bleeding was found in the duodenal bulb. ?Impression:               - Z-line regular, 39 cm from the incisors. ?                          - Gastritis. Biopsied. ?                          - Erythematous duodenopathy. ?Moderate Sedation: ?     Per Anesthesia Care ?Recommendation:           - Patient has a contact number available for  ?                          emergencies. The signs and symptoms of potential  ?                          delayed complications were discussed with the  ?                          patient. Return to normal activities tomorrow.  ?                          Written discharge instructions were provided to the  ?                          patient. ?                          - Resume previous diet. ?                          - Continue present medications. ?                          - Await pathology results. ?                          - Use Protonix (pantoprazole) 40 mg PO BID. ?                          - No ibuprofen, naproxen, or other non-steroidal  ?                          anti-inflammatory drugs. ?Procedure Code(s):        ---  Professional --- ?                          [phone], Esophagogastroduodenoscopy, flexible,  ?                          transoral; with biopsy, single or multiple ?Diagnosis Code(s):        --- Professional --- ?                          K29.70, Gastritis, unspecified, without bleeding ?                          K31.89, Other diseases of stomach and duodenum ?                          R12, Heartburn ?CPT copyright 2019 American Medical Association. All rights reserved. ?The codes documented in this report are preliminary and upon coder review may  ?be revised to meet current compliance requirements. ?Elon Alas. Abbey Chatters, DO ?Elon Alas. [place], DO ?03/03/2022 8:22:51  AM ?This report has been signed electronically. ?Number of Addenda: 0 ?

## 2022-03-03 NOTE — Telephone Encounter (Signed)
Returned the pt's call and was advised that after colonoscopy this morning he had black jelly like stuff coming from his anal opening. The pt is not in pain. This only has happened one time today. Pt wants to know if this is normal? ?

## 2022-03-03 NOTE — Telephone Encounter (Signed)
Called and spoke with patient.  Underwent EGD and colonoscopy today. ? ?EGD showed gastritis duodenitis.  Gastric biopsies taken.  Colonoscopy with 2 polyps removed with cold snare.  Numerous colon biopsies taken for microscopic colitis testing. ? ?States since his procedure he he has had 4 episodes of black jellylike material from his rectum.  No generalized weakness/fatigue.  No dyspnea or chest pain.  No abdominal pain. ? ?I discussed with him that this is likely washout/digested blood from polyps and biopsies performed today.  We will continue to monitor for now.  If he is still having issues in the a.m. then give [place] a call. ? ?If he develops chest pain, shortness of breath, worsening output, rectal bleeding, generalized weakness/fatigue, then he needs to go to the emergency room for evaluation.  He is agreeable. ?

## 2022-03-03 NOTE — Telephone Encounter (Signed)
Patient reports he is wearing pull ups and having black "jelly" like sutff coming out from his anus. He wants to know if this is normal. Had procedure done today with Dr. Abbey Chatters. Wants a call back today as he is very worried. ?

## 2022-03-03 NOTE — Discharge Instructions (Addendum)
EGD ?Discharge instructions ?Please read the instructions outlined below and refer to this sheet in the next few weeks. These discharge instructions provide you with general information on caring for yourself after you leave the hospital. Your doctor may also give you specific instructions. While your treatment has been planned according to the most current medical practices available, unavoidable complications occasionally occur. If you have any problems or questions after discharge, please call your doctor. ?ACTIVITY ?You may resume your regular activity but move at a slower pace for the next 24 hours.  ?Take frequent rest periods for the next 24 hours.  ?Walking will help expel (get rid of) the air and reduce the bloated feeling in your abdomen.  ?No driving for 24 hours (because of the anesthesia (medicine) used during the test).  ?You may shower.  ?Do not sign any important legal documents or operate any machinery for 24 hours (because of the anesthesia used during the test).  ?NUTRITION ?Drink plenty of fluids.  ?You may resume your normal diet.  ?Begin with a light meal and progress to your normal diet.  ?Avoid alcoholic beverages for 24 hours or as instructed by your caregiver.  ?MEDICATIONS ?You may resume your normal medications unless your caregiver tells you otherwise.  ?WHAT YOU CAN EXPECT TODAY ?You may experience abdominal discomfort such as a feeling of fullness or ?gas? pains.  ?FOLLOW-UP ?Your doctor will discuss the results of your test with you.  ?SEEK IMMEDIATE MEDICAL ATTENTION IF ANY OF THE FOLLOWING OCCUR: ?Excessive nausea (feeling sick to your stomach) and/or vomiting.  ?Severe abdominal pain and distention (swelling).  ?Trouble swallowing.  ?Temperature over 101? F (37.8? C).  ?Rectal bleeding or vomiting of blood.  ? ? ? ?Colonoscopy ?Discharge Instructions ? ?Read the instructions outlined below and refer to this sheet in the next few weeks. These discharge instructions provide you with  general information on caring for yourself after you leave the hospital. Your doctor may also give you specific instructions. While your treatment has been planned according to the most current medical practices available, unavoidable complications occasionally occur.  ? ?ACTIVITY ?You may resume your regular activity, but move at a slower pace for the next 24 hours.  ?Take frequent rest periods for the next 24 hours.  ?Walking will help get rid of the air and reduce the bloated feeling in your belly (abdomen).  ?No driving for 24 hours (because of the medicine (anesthesia) used during the test).   ?Do not sign any important legal documents or operate any machinery for 24 hours (because of the anesthesia used during the test).  ?NUTRITION ?Drink plenty of fluids.  ?You may resume your normal diet as instructed by your doctor.  ?Begin with a light meal and progress to your normal diet. Heavy or fried foods are harder to digest and may make you feel sick to your stomach (nauseated).  ?Avoid alcoholic beverages for 24 hours or as instructed.  ?MEDICATIONS ?You may resume your normal medications unless your doctor tells you otherwise.  ?WHAT YOU CAN EXPECT TODAY ?Some feelings of bloating in the abdomen.  ?Passage of more gas than usual.  ?Spotting of blood in your stool or on the toilet paper.  ?IF YOU HAD POLYPS REMOVED DURING THE COLONOSCOPY: ?No aspirin products for 7 days or as instructed.  ?No alcohol for 7 days or as instructed.  ?Eat a soft diet for the next 24 hours.  ?FINDING OUT THE RESULTS OF YOUR TEST ?Not all test results are  available during your visit. If your test results are not back during the visit, make an appointment with your caregiver to find out the results. Do not assume everything is normal if you have not heard from your caregiver or the medical facility. It is important for you to follow up on all of your test results.  ?SEEK IMMEDIATE MEDICAL ATTENTION IF: ?You have more than a spotting of  blood in your stool.  ?Your belly is swollen (abdominal distention).  ?You are nauseated or vomiting.  ?You have a temperature over 101.  ?You have abdominal pain or discomfort that is severe or gets worse throughout the day.  ? ?Your EGD revealed moderate amount inflammation in your stomach.  I took biopsies of this to rule out infection with a bacteria called H. pylori.  Await pathology results, my office will contact you. Continue on Pantoprazole ? ?Your colonoscopy revealed 2 polyp(s) which I removed successfully. Await pathology results, my office will contact you. I recommend repeating colonoscopy in 5 years for surveillance purposes.  ? ?I took biopsies of your colon to rule out a condition called microscopic colitis which can cause chronic diarrhea. ? ?Follow-up with Dr. Marletta Lor in 3 months.  ? ?We will be in touch about your hepatitis C medications. ? ?I hope you have a great rest of your week! ? ?Hennie Duos. Marletta Lor, D.O. ?Gastroenterology and Hepatology ?[facility] ? ? ?

## 2022-03-03 NOTE — Transfer of Care (Signed)
Immediate Anesthesia Transfer of Care Note ? ?Patient: Curtis Hartman ? ?Procedure(s) Performed: COLONOSCOPY WITH PROPOFOL ?ESOPHAGOGASTRODUODENOSCOPY (EGD) WITH PROPOFOL ?BIOPSY ?POLYPECTOMY ? ?Patient Location: Short Stay ? ?Anesthesia Type:General ? ?Level of Consciousness: awake ? ?Airway & Oxygen Therapy: Patient Spontanous Breathing ? ?Post-op Assessment: Report given to RN and Post -op Vital signs reviewed and stable ? ?Post vital signs: Reviewed and stable ? ?Last Vitals:  ?Vitals Value Taken Time  ?BP    ?Temp    ?Pulse    ?Resp    ?SpO2    ? ? ?Last Pain:  ?Vitals:  ? 03/03/22 0814  ?PainSc: 5   ?   ? ?  ? ?Complications: No notable events documented. ?

## 2022-03-04 LAB — SURGICAL PATHOLOGY

## 2022-03-04 NOTE — Telephone Encounter (Signed)
noted 

## 2022-03-10 ENCOUNTER — Encounter (HOSPITAL_COMMUNITY): Payer: Self-pay | Admitting: Internal Medicine

## 2022-03-13 ENCOUNTER — Telehealth: Payer: Self-pay | Admitting: Internal Medicine

## 2022-03-13 NOTE — Telephone Encounter (Signed)
noted 

## 2022-03-13 NOTE — Telephone Encounter (Signed)
Can we start the process of getting treatment for this patient's hepatitis C with Mavyret x8 weeks? ? ?Genotype 2, viral load 2.4 million. ? ?Please schedule patient for labs 4 weeks after starting treatment (CBC, CMP, HCV RNA). ? ?How to take Mavyret: ?Do not stop taking Mavyret without first notifying us to discuss why you are wanting to stop taking Mavyret ?Take Three tablets (glecaprevir 100 mg/pibrentasvir 40 mg per tablet) once daily for 8 weeks, at the same time every day. ?Your can take Mavyret with or without food. ?Do not miss or skip any doses of Mavyret during treatment. ?If you take too much Mavyret, notify us or go to the nearest emergency room. ? ?DO NOT start any new medications, whether prescription or over-the-counter, no herbs, supplements, any other treatments before checking with [place] first. ? ?Please have the patient sign the medication acknowledgement form when he comes to pick up their Mavyret. ? ?Please schedule follow-up visit for labs and updated OV at end of treatment (typically 8 weeks after starting medication). ? ?

## 2022-03-18 NOTE — Telephone Encounter (Signed)
Phoned and spoke with the pt and advised that I was waiting for Dr. Marletta Lor on tomorrow to sign his paperwork and then I will send it to [facility]. Once that's done I will contact pt once medication arrives here. Pt expressed understanding. ?

## 2022-03-20 NOTE — Telephone Encounter (Signed)
Paperwork faxed to [facility] for the pt. Waiting to hear back before I give to Manuela Schwartz for scan. (Rx, [place] report, last ov note)

## 2022-03-27 ENCOUNTER — Telehealth: Payer: Self-pay | Admitting: Internal Medicine

## 2022-03-27 ENCOUNTER — Telehealth: Payer: Self-pay

## 2022-03-27 DIAGNOSIS — B182 Chronic viral hepatitis C: Secondary | ICD-10-CM

## 2022-03-27 NOTE — Telephone Encounter (Signed)
Teneshia from [facility] phoned stating they need a HIV test from the pt so it can be faxed along with the rest of his blood work they need. Please advise

## 2022-03-27 NOTE — Telephone Encounter (Signed)
Phoned and advised the pt of the above note and the pt expressed understanding of the HIV test being needed. Pt states he will go tomorrow and have this done.

## 2022-03-27 NOTE — Telephone Encounter (Signed)
Please let patient know [facility] requires an HIV test.  I have placed this order at [facility] labs.  Please have him go have this done at his earliest convenience so we can get his hepatitis C treated.  Thank you

## 2022-04-03 NOTE — Telephone Encounter (Signed)
[facility] sent documentation that the pt needs a HCV RNA viral load test within past 6 months. Please advise

## 2022-04-08 LAB — HIV ANTIBODY (ROUTINE TESTING W REFLEX): HIV 1&2 Ab, 4th Generation: NONREACTIVE

## 2022-04-09 ENCOUNTER — Telehealth: Payer: Self-pay | Admitting: Internal Medicine

## 2022-04-09 DIAGNOSIS — B182 Chronic viral hepatitis C: Secondary | ICD-10-CM

## 2022-04-09 NOTE — Telephone Encounter (Signed)
Please let patient know that [facility] is now requesting a more updated hepatitis c viral load.  I will order this at [facility] lab.    Please make sure there is no other blood work we need to have done before getting his hepatitis C treated.  Thank you

## 2022-04-10 NOTE — Telephone Encounter (Signed)
[facility]oned today stating that the pt did not need anything else. The pt's medication will be delivered here on next Tuesday 13th. The pt is already aware of when it is to be shipped and I will call the pt to sign paperwork and pick up his medication.

## 2022-04-15 ENCOUNTER — Other Ambulatory Visit: Payer: Self-pay

## 2022-04-15 DIAGNOSIS — B1920 Unspecified viral hepatitis C without hepatic coma: Secondary | ICD-10-CM

## 2022-04-15 DIAGNOSIS — B182 Chronic viral hepatitis C: Secondary | ICD-10-CM

## 2022-04-15 NOTE — Progress Notes (Signed)
ERROR

## 2022-04-15 NOTE — Telephone Encounter (Signed)
FYI:  Dr. Abbey Chatters   Pt's medication came today. Phoned and spoke with the pt and he will be by sometime Thursday to pick it up. Advised the pt we don't do Fridays. Everything is ready for him to pick up and instructions

## 2022-04-28 ENCOUNTER — Telehealth: Payer: Self-pay | Admitting: Internal Medicine

## 2022-04-29 NOTE — Telephone Encounter (Signed)
Pt wanted to let me know that he had to get another ride because the company is closed he was going to use

## 2022-05-05 NOTE — Telephone Encounter (Signed)
Pt wanted to let me know that he had to get another ride because the company is closed he was going to use      05/05/2022 Returned the pt's call and LMOVM for the pt to return call

## 2022-05-15 NOTE — Telephone Encounter (Signed)
FYI: Pt called stating he will be here on next Tuesday, July 18th @ 10:00 am.  Also pt is complaining of still having bad, darl colored diarrhea. States he has a BM 5 or more a day . He has experienced vomiting that burned this throat but after drinking cold water he felt better. Pt states this is the 4th time this has happen to him and that he made you aware of this when he was in office. Please advise

## 2022-05-16 ENCOUNTER — Telehealth: Payer: Self-pay | Admitting: Internal Medicine

## 2022-05-16 DIAGNOSIS — R197 Diarrhea, unspecified: Secondary | ICD-10-CM

## 2022-05-16 NOTE — Telephone Encounter (Signed)
I will order stool studies to be dropped off at [facility] to rule out infectious causes of his diarrhea.  If negative, we can treat this further with medications.

## 2022-05-19 NOTE — Telephone Encounter (Signed)
Phoned and LMOVM for the pt to return call 

## 2022-05-20 ENCOUNTER — Other Ambulatory Visit: Payer: Self-pay

## 2022-05-20 DIAGNOSIS — B182 Chronic viral hepatitis C: Secondary | ICD-10-CM

## 2022-05-20 DIAGNOSIS — B1920 Unspecified viral hepatitis C without hepatic coma: Secondary | ICD-10-CM

## 2022-05-20 NOTE — Telephone Encounter (Signed)
FYI:  Pt arrived to sign and pick up his medications. Was given his 4 week lab forms (but advised not to do until after finishing the 4 week course). Pt advises that he was told to meet you here today to be seen I advised him "no". He was called regarding his medications/stool studies. Pt and his sister insisted he have an appt so I directed them to the front desk to schedule with you because he specifically asked for you.

## 2022-05-21 NOTE — Telephone Encounter (Signed)
Noted, okay to schedule OV with me.  Thank you

## 2022-05-21 NOTE — Telephone Encounter (Signed)
Noted   Pt already scheduled to see Dr Marletta Lor

## 2022-06-04 ENCOUNTER — Other Ambulatory Visit: Payer: Self-pay | Admitting: Neurosurgery

## 2022-06-04 ENCOUNTER — Other Ambulatory Visit (HOSPITAL_COMMUNITY): Payer: Self-pay | Admitting: Neurosurgery

## 2022-06-04 DIAGNOSIS — M5412 Radiculopathy, cervical region: Secondary | ICD-10-CM

## 2022-06-10 ENCOUNTER — Ambulatory Visit (HOSPITAL_COMMUNITY)
Admission: RE | Admit: 2022-06-10 | Discharge: 2022-06-10 | Disposition: A | Discharge status: Home / Self Care | Payer: Medicaid Other | Source: Ambulatory Visit | Attending: Neurosurgery | Admitting: Neurosurgery

## 2022-06-10 DIAGNOSIS — M5412 Radiculopathy, cervical region: Secondary | ICD-10-CM | POA: Insufficient documentation

## 2022-06-18 ENCOUNTER — Telehealth: Payer: Self-pay

## 2022-06-18 NOTE — Telephone Encounter (Signed)
Patient called late yesterday and left a vm stating that he had received lab orders for [facility] and that he needed them changed to [facility]. When I called the patient back today he stated that he went on to [facility] and had labs drawn there. The only lab for [place] that was there was an HCV RNA. The patient did not get the cmp or the cbc w/diff that you wanted drawn. However, he had a renal panel and a cbc without diff done for his kidney doctor. Will those labs suffice for you or do you need him to go back to the lab and have the cbc w/diff and the cmp drawn for you. I was unable to add those two on to the blood that was drawn today. Please advise.

## 2022-06-18 NOTE — Addendum Note (Signed)
Addended by: Zada Finders on: 06/18/2022 03:17 PM   Modules accepted: Orders

## 2022-06-20 NOTE — Telephone Encounter (Signed)
Patient needs hepatic function panel.  If he has had hepatitis C viral load already drawn then that is all I need.  If not, then he also needs a hepatitis C viral load.  CBC BMP reviewed.  Thank you

## 2022-06-21 LAB — HEPATITIS C RNA QUANTITATIVE
HCV Quantitative Log: <1.18 log IU/mL
HCV RNA, PCR, QN: <15 IU/mL

## 2022-06-22 LAB — GI PROFILE, STOOL, PCR

## 2022-06-23 NOTE — Telephone Encounter (Signed)
Pt did have a hep c viral load done. Do you still want him to have a hepatic function panel done?

## 2022-06-25 ENCOUNTER — Ambulatory Visit (INDEPENDENT_AMBULATORY_CARE_PROVIDER_SITE_OTHER): Payer: Medicaid Other | Admitting: Internal Medicine

## 2022-06-25 ENCOUNTER — Encounter: Payer: Self-pay | Admitting: Internal Medicine

## 2022-06-25 DIAGNOSIS — K580 Irritable bowel syndrome with diarrhea: Secondary | ICD-10-CM

## 2022-06-25 DIAGNOSIS — B182 Chronic viral hepatitis C: Secondary | ICD-10-CM

## 2022-06-25 DIAGNOSIS — K219 Gastro-esophageal reflux disease without esophagitis: Secondary | ICD-10-CM | POA: Diagnosis not present

## 2022-06-25 DIAGNOSIS — K589 Irritable bowel syndrome without diarrhea: Secondary | ICD-10-CM | POA: Insufficient documentation

## 2022-06-25 MED ORDER — PANTOPRAZOLE SODIUM 40 MG PO TBEC: 40.0000 mg | DELAYED_RELEASE_TABLET | Freq: Every day | ORAL | 5 refills | Status: DC

## 2022-06-25 NOTE — Patient Instructions (Signed)
Continue your Mavyret at for the full 8-week course.  Your most recent blood work looked good.  We will recheck blood work in approximately 4 months to ensure that you have been cured.  I want you to start taking your pantoprazole every day.  I sent refills to your pharmacy.  You can take Imodium as needed for diarrhea if you have a flare of your symptoms.  Follow-up with GI in 4 months.  It was nice seeing you again today.  Dr. Marletta Lor

## 2022-06-25 NOTE — Progress Notes (Signed)
Primary Care Physician:  Waldon Reining, MD Primary Gastroenterologist:  Dr. Marletta Lor  Chief Complaint  Patient presents with   Follow-up    Patient here today for a follow up on Hep C He is still taking Mayret. Patient is having issues with reflux and take pantoprazole prn. He also has issues with diarrhea, and does not take any medication for this.    HPI:   Curtis Hartman is a 61 y.o. male who presents to clinic today for follow-up visit.  History of chronic hepatitis C, genotype 2, viral load 2.4 million.  Hepatitis B testing unremarkable.  HIV negative.    Right quadrant ultrasound with elastography unremarkable, median K PA 4.5.  Currently on Mavyret and tolerating well.  5 weeks into treatment of an 8-week course.  Recent viral load unremarkable 06/18/2022.  Also has chronic GERD.  Previously on omeprazole 20 mg daily.  Underwent EGD 03/03/2022 which showed gastritis duodenitis, biopsies negative for H. pylori.  Switch to pantoprazole 40 mg twice daily.  States he just takes this medication as needed.  Will occasionally have flares which induced vomiting.  Also with chronic IBS historically diarrhea predominant.  This is slowly improved.  He states he is having normal loose bowel movements at times.  Will occasionally have blowouts with diarrhea.  Previously on Bentyl.  Colonoscopy 03/03/2022 with random biopsies negative for microscopic colitis.  2 polyps removed from the transverse and sigmoid colon, both tubular adenomas.  5-year recall.  Past Medical History:  Diagnosis Date   CKD (chronic kidney disease)    Diabetes mellitus without complication (HCC)    Hypertension     Past Surgical History:  Procedure Laterality Date   BIOPSY  03/03/2022   Procedure: BIOPSY;  Surgeon: Lanelle Bal, DO;  [facility];  Service: Endoscopy;;   COLONOSCOPY WITH PROPOFOL N/A 03/03/2022   Procedure: COLONOSCOPY WITH PROPOFOL;  Surgeon: Lanelle Bal, DO;  [facility];  Service: Endoscopy;  Laterality: N/A;  8:00am   ESOPHAGOGASTRODUODENOSCOPY (EGD) WITH PROPOFOL N/A 03/03/2022   Procedure: ESOPHAGOGASTRODUODENOSCOPY (EGD) WITH PROPOFOL;  Surgeon: Lanelle Bal, DO;  [facility]: [facility];  Service: Endoscopy;  Laterality: N/A;   foot right partial toe amputation Right    FOOT SURGERY     hernia surgery x4     POLYPECTOMY  03/03/2022   Procedure: POLYPECTOMY;  Surgeon: Lanelle Bal, DO;  [facility]: [facility];  Service: Endoscopy;;    Current Outpatient Medications  Medication Sig Dispense Refill   amLODipine (NORVASC) 10 MG tablet Take 10 mg by mouth daily.     Glecaprevir-Pibrentasvir (MAVYRET PO) Take by mouth. Three tablets once per day.     insulin aspart (NOVOLOG) 100 UNIT/ML injection Inject 2-10 Units into the skin 3 (three) times daily as needed (blood sugar over 150).     LANTUS 100 UNIT/ML injection Inject 5 Units into the skin at bedtime as needed (blood sugar over 120).     OVER THE COUNTER MEDICATION Vit d 3 once per day.     atorvastatin (LIPITOR) 20 MG tablet Take 20 mg by mouth daily. (Patient not taking: Reported on 02/21/2022)     pantoprazole (PROTONIX) 40 MG tablet Take 1 tablet (40 mg total) by mouth daily. 30 tablet 5   No current facility-administered medications for this visit.    Allergies as of 06/25/2022   (No Known Allergies)    Family History  Problem Relation Age of Onset  Heart disease Mother    Seizures Father    Stroke Maternal Grandfather    Liver disease Paternal Grandmother     Social History   Socioeconomic History   Marital status: Single    Spouse name: Not on file   Number of children: Not on file   Years of education: Not on file   Highest education level: Not on file  Occupational History   Not on file  Tobacco Use   Smoking status: Every Day    Types: Cigarettes    Passive exposure: Never   Smokeless tobacco: Never  Vaping Use   Vaping Use: Never used  Substance and  Sexual Activity   Alcohol use: Not Currently   Drug use: Not Currently   Sexual activity: Not on file  Other Topics Concern   Not on file  Social History Narrative   Not on file   Social Determinants of Health   Financial Resource Strain: Not on file  Food Insecurity: Not on file  Transportation Needs: Not on file  Physical Activity: Not on file  Stress: Not on file  Social Connections: Not on file  Intimate Partner Violence: Not on file    Subjective: Review of Systems  Constitutional:  Negative for chills and fever.  HENT:  Negative for congestion and hearing loss.   Eyes:  Negative for blurred vision and double vision.  Respiratory:  Negative for cough and shortness of breath.   Cardiovascular:  Negative for chest pain and palpitations.  Gastrointestinal:  Positive for diarrhea and heartburn. Negative for abdominal pain, blood in stool, constipation, melena and vomiting.  Genitourinary:  Negative for dysuria and urgency.  Musculoskeletal:  Negative for joint pain and myalgias.  Skin:  Negative for itching and rash.  Neurological:  Negative for dizziness and headaches.  Psychiatric/Behavioral:  Negative for depression. The patient is not nervous/anxious.        Objective: BP 133/69 (BP Location: Left Arm, Patient Position: Sitting, Cuff Size: Small)   Pulse 85   Temp 97.9 F (36.6 C) (Oral)   Ht 5\' 9"  (1.753 m)   Wt 148 lb 1.6 oz (67.2 kg)   BMI 21.87 kg/m  Physical Exam Constitutional:      Appearance: Normal appearance.  HENT:     Head: Normocephalic and atraumatic.  Eyes:     Extraocular Movements: Extraocular movements intact.     Conjunctiva/sclera: Conjunctivae normal.  Cardiovascular:     Rate and Rhythm: Normal rate and regular rhythm.  Pulmonary:     Effort: Pulmonary effort is normal.     Breath sounds: Normal breath sounds.  Abdominal:     General: Bowel sounds are normal.     Palpations: Abdomen is soft.  Musculoskeletal:        General:  Normal range of motion.     Cervical back: Normal range of motion and neck supple.  Skin:    General: Skin is warm.  Neurological:     General: No focal deficit present.     Mental Status: He is alert and oriented to person, place, and time.  Psychiatric:        Mood and Affect: Mood normal.        Behavior: Behavior normal.      Assessment: *Chronic hepatitis C *Chronic GERD *Irritable bowel syndrome with diarrhea  Plan: The patient's chronic GERD, I recommend he start taking pantoprazole every day regardless of symptoms to hopefully prevent any flares.  He can start at once daily  and see how he does.  IBS with diarrhea slowly improving.  Previously on Bentyl which did not help.  Recommend he take over-the-counter Imodium as needed.  Currently week 5 of 8-week course of Mavyret for chronic hepatitis C.  Tolerating medication well.  Recent viral load unremarkable.  Continue for full 8 weeks.  Recheck viral load 3 months after completion to ensure SVR.  Follow-up in 4 months. 06/25/2022 10:35 AM   Disclaimer: This note was dictated with voice recognition software. Similar sounding words can inadvertently be transcribed and may not be corrected upon review.

## 2022-09-04 ENCOUNTER — Encounter: Payer: Self-pay | Admitting: *Deleted

## 2022-09-17 ENCOUNTER — Ambulatory Visit: Payer: Medicaid Other | Admitting: Internal Medicine

## 2022-10-06 ENCOUNTER — Ambulatory Visit: Payer: Medicaid Other | Admitting: Gastroenterology

## 2022-10-07 ENCOUNTER — Other Ambulatory Visit: Payer: Self-pay | Admitting: Neurosurgery

## 2022-10-20 NOTE — Progress Notes (Unsigned)
GI Office Note    Referring Provider: Waldon Reining, MD Primary Care Physician:  Waldon Reining, MD Primary Gastroenterologist: Hennie Duos. Marletta Lor, DO  Date:  10/21/2022  ID:  Curtis Hartman, DOB 01-21-1961, MRN [id number]   Chief Complaint   Chief Complaint  Patient presents with   Follow-up    History of Present Illness  Curtis Hartman is a 61 y.o. male with a history of hepatitis C, GERD, IBS-diarrhea predominant, CKD, diabetes, HTN presenting today for follow-up.  EGD 03/03/2022: -Gastritis -Duodenitis -Biopsies negative for H. Pylori  Colonoscopy 03/03/2022: -Random biopsies negative for microscopic colitis -2 polyps removed from the transverse and sigmoid colon -Polyp biopsies consistent with tubular adenomas -Advised repeat in 5 years.  Hepatitis C history: Genotype 2, viral load 2.4 million HIV and hepatitis B negative. Right upper quadrant ultrasound with elastography unremarkable, median K PA 4.5. Completed 8 weeks of Mavyret Viral load unremarkable 06/18/2022.  Last office visit 06/25/2022.  Patient was tolerating Mavyret well, was 5 weeks into treatment.  Noted history of GERD, on omeprazole 20 mg daily as needed.  Has occasional flares which induces vomiting.  Noted history of diarrhea predominant IBS with slow improvement.  Occasionally has normal bowel movements mixed with occasional pullout diarrhea episodes.  Previously on Bentyl.  Patient was advised to start taking pantoprazole every day regardless of any symptoms to help prevent flares.  Reportedly Bentyl did not help with diarrhea.  Advised to take over-the-counter Imodium as needed.  Advised repeat followed in 3 months to ensure SVR.  Advised to follow-up in 4 months.  Labs 07/29/2022: Hemoglobin 12.4, MCV 83.6, platelets 354, creatinine 1.88, BUN 38, glucose 198, GFR 40.  Today: GERD: Is not taking pantoprazole everyday. Has occasional symptoms with vomiting. Typically takes a half a pill when he  needs it. Last flare he was down for 2 days. Used to have significant reflux. Thinks the full dose of pantoprazole makes him worse. Has been smoking less over the last few days. States he will stop drinking coffee for a day and drink mild and that helps his stomach more.   Stopped drinking alcohol 2 years ago. Blood sugars under good control.   IBS, diarrhea: Has been having hard stools recently. In the last few weeks - has a few softer stools. Not taking imodium. Not taking an over the counter stool softener. Happy with his current bowel habits.   Hepatitis C: completed treatment middle of September. Energy is fair. Has good appetite. Has gaine d a few pounds recently. Denise any issues with Mavyret. Denise jaundice, pruritus.    Current Outpatient Medications  Medication Sig Dispense Refill   amLODipine (NORVASC) 10 MG tablet Take 10 mg by mouth daily.     gabapentin (NEURONTIN) 400 MG capsule 1 capsule Orally Twice a day for 30 day(s)     insulin aspart (NOVOLOG) 100 UNIT/ML injection Inject 2-10 Units into the skin 3 (three) times daily as needed (blood sugar over 150).     LANTUS 100 UNIT/ML injection Inject 5 Units into the skin at bedtime as needed (blood sugar over 120).     OVER THE COUNTER MEDICATION Vit d 3 once per day.     atorvastatin (LIPITOR) 20 MG tablet Take 20 mg by mouth daily. (Patient not taking: Reported on 02/21/2022)     Glecaprevir-Pibrentasvir (MAVYRET PO) Take by mouth. Three tablets once per day. (Patient not taking: Reported on 10/21/2022)     pantoprazole (PROTONIX) 40 MG tablet Take 1  tablet (40 mg total) by mouth daily. (Patient not taking: Reported on 10/21/2022) 30 tablet 5   No current facility-administered medications for this visit.    Past Medical History:  Diagnosis Date   CKD (chronic kidney disease)    Diabetes mellitus without complication (HCC)    Hypertension     Past Surgical History:  Procedure Laterality Date   BIOPSY  03/03/2022    Procedure: BIOPSY;  Surgeon: Lanelle Bal, DO;  [facility]: [facility];  Service: Endoscopy;;   COLONOSCOPY WITH PROPOFOL N/A 03/03/2022   Procedure: COLONOSCOPY WITH PROPOFOL;  Surgeon: Lanelle Bal, DO;  [facility];  Service: Endoscopy;  Laterality: N/A;  8:00am   ESOPHAGOGASTRODUODENOSCOPY (EGD) WITH PROPOFOL N/A 03/03/2022   Procedure: ESOPHAGOGASTRODUODENOSCOPY (EGD) WITH PROPOFOL;  Surgeon: Lanelle Bal, DO;  [facility];  Service: Endoscopy;  Laterality: N/A;   foot right partial toe amputation Right    FOOT SURGERY     hernia surgery x4     POLYPECTOMY  03/03/2022   Procedure: POLYPECTOMY;  Surgeon: Lanelle Bal, DO;  [facility];  Service: Endoscopy;;    Family History  Problem Relation Age of Onset   Heart disease Mother    Seizures Father    Stroke Maternal Grandfather    Liver disease Paternal Grandmother     Allergies as of 10/21/2022   (No Known Allergies)    Social History   Socioeconomic History   Marital status: Single    Spouse name: Not on file   Number of children: Not on file   Years of education: Not on file   Highest education level: Not on file  Occupational History   Not on file  Tobacco Use   Smoking status: Every Day    Types: Cigarettes    Passive exposure: Never   Smokeless tobacco: Never  Vaping Use   Vaping Use: Never used  Substance and Sexual Activity   Alcohol use: Not Currently   Drug use: Not Currently   Sexual activity: Not on file  Other Topics Concern   Not on file  Social History Narrative   Not on file   Social Determinants of Health   Financial Resource Strain: Not on file  Food Insecurity: Not on file  Transportation Needs: Not on file  Physical Activity: Not on file  Stress: Not on file  Social Connections: Not on file     Review of Systems   Gen: Denies fever, chills, anorexia. Denies fatigue, weakness, weight loss.  CV: Denies chest pain, palpitations,  syncope, peripheral edema, and claudication. Resp: Denies dyspnea at rest, cough, wheezing, coughing up blood, and pleurisy. GI: See HPI Derm: Denies rash, itching, dry skin Psych: Denies depression, anxiety, memory loss, confusion. No homicidal or suicidal ideation.  Heme: Denies bruising, bleeding, and enlarged lymph nodes.   Physical Exam   BP 116/73 (BP Location: Right Arm, Patient Position: Sitting, Cuff Size: Normal)   Pulse 78   Temp 97.6 F (36.4 C) (Temporal)   Ht 5\' 9"  (1.753 m)   Wt 159 lb 3.2 oz (72.2 kg)   SpO2 98%   BMI 23.51 kg/m   General:   Alert and oriented. No distress noted. Pleasant and cooperative.  Head:  Normocephalic and atraumatic. Eyes:  Conjuctiva clear without scleral icterus. Mouth:  Oral mucosa pink and moist. Good dentition. No lesions. Lungs:  Clear to auscultation bilaterally. No wheezes, rales, or rhonchi. No distress.  Heart:  S1, S2  present without murmurs appreciated.  Abdomen:  +BS, soft, non-tender and non-distended. No rebound or guarding. No HSM or masses noted. Rectal: deferred Msk:  Symmetrical without gross deformities. Normal posture. Walks with 4 wheel walker.  Extremities:  Without edema. Neurologic:  Alert and oriented x4.  Psych:  Alert and cooperative. Normal mood and affect.   Assessment  Curtis Hartman is a 61 y.o. male with a history of hepatitis C, GERD, IBS-diarrhea predominant, CKD, diabetes, HTN presenting today for follow-up.  Chronic hepatitis C: Genotype 2 without any significant fibrosis, right upper quadrant ultrasound with elastography.  Previous viral load 2.4 million.  Treated with 8-week course of Mavyret without any complication.  Currently due for labs to check for SVR which we will order today.  Patient intends to complete next week when he will have lab work done for Dr. Wolfgang Phoenix.   Chronic GERD: History of chronic GERD.  Patient stated prior history of Barrett's esophagus with significant complications  about 5 years ago.  Most recent EGD in May 2023 with gastritis and duodenitis with biopsies negative for H. pylori.  Continues to report occasional flares resulting in vomiting.  Previously recommended by Dr. Marletta Lor to take pantoprazole daily regardless of symptoms however patient continues to take as needed.  Currently only taking half tablet of pantoprazole as needed.  Discussed sending in a prescription and taking pantoprazole 20 mg daily however patient declined and wanted to continue current regimen.  IBS with diarrhea: Has not needed to use any Imodium.  Having more firm stools over the last 2 weeks.  Not taking any stool softeners.  Suspect symptoms related to change in blood pressure medication.  Currently pleased with his bowel habits since he is not having multiple episodes of diarrhea a day.  Advised that he may use a stool softener if constipation were to worsen, or if diarrhea reoccurred they may use Imodium as needed.  PLAN   HCV RNA Recommended lower dose of pantoprazole to use daily, patient declined. Would like to continue current regimen. May use over the counter stool softener as needed for constipation, or Imodium as needed for diarrhea. Follow up in 6 months.     Brooke Bonito, MSN, FNP-BC, AGACNP-BC [facility]

## 2022-10-21 ENCOUNTER — Encounter: Payer: Self-pay | Admitting: Gastroenterology

## 2022-10-21 ENCOUNTER — Ambulatory Visit (INDEPENDENT_AMBULATORY_CARE_PROVIDER_SITE_OTHER): Payer: Medicaid Other | Admitting: Gastroenterology

## 2022-10-21 VITALS — BP 116/73 | HR 78 | Temp 97.6°F | Ht 69.0 in | Wt 159.2 lb

## 2022-10-21 DIAGNOSIS — K580 Irritable bowel syndrome with diarrhea: Secondary | ICD-10-CM

## 2022-10-21 DIAGNOSIS — B182 Chronic viral hepatitis C: Secondary | ICD-10-CM | POA: Diagnosis not present

## 2022-10-21 DIAGNOSIS — K219 Gastro-esophageal reflux disease without esophagitis: Secondary | ICD-10-CM | POA: Diagnosis not present

## 2022-10-21 NOTE — Patient Instructions (Addendum)
I am ordering lab work to check to make sure your hepatitis C treatment was successful.  Please have this done within the next couple of weeks, may be done at the time of your lab work that you will perform for Dr. Wolfgang Phoenix.  Please take your labs up with you just in case.  If diarrhea reoccurs, he may use Imodium as needed.  If constipation worsens, you may begin taking a daily stool softener.  Given your history of Barrett's esophagus, and inflammation noted to your stomach and small bowel on your endoscopy in May, we continue to recommend daily PPI with pantoprazole.  If you are experiencing possible side effects from pantoprazole, we can change medications or reduce the dose from 40 mg to 20 mg.  Please let me know if you change your mind.   I hope you have a wonderful Christmas and a happy new year!  It was a pleasure to see you today. I want to create trusting relationships with patients. If you receive a survey regarding your visit,  I greatly appreciate you taking time to fill this out on paper or through your MyChart. I value your feedback.  Brooke Bonito, MSN, FNP-BC, AGACNP-BC [facility]

## 2022-10-30 NOTE — Pre-Procedure Instructions (Signed)
Surgical Instructions    Your procedure is scheduled on Wednesday, November 05, 2022 at 1:02 PM.  Report to [facility] Main Entrance "A" at 11:00 A.M., then check in with the Admitting office.  Call this number if you have problems the morning of surgery:  [phone]   If you have any questions prior to your surgery date call [phone]: Open Monday-Friday 8am-4pm  *If you experience any cold or flu symptoms such as cough, fever, chills, shortness of breath, etc. between now and your scheduled surgery, please notify us.*    Remember:  Do not eat or drink after midnight the night before your surgery    Take these medicines the morning of surgery with A SIP OF WATER:  fluticasone (FLONASE)  gabapentin (NEURONTIN)   IF NEEDED: morphine (MSIR)  pantoprazole (PROTONIX)   As of today, STOP taking any Aspirin (unless otherwise instructed by your surgeon) Aleve, Naproxen, Ibuprofen, Motrin, Advil, Goody's, BC's, all herbal medications, fish oil, and all vitamins.  WHAT DO I DO ABOUT MY DIABETES MEDICATION?   THE NIGHT BEFORE SURGERY, take 2 units of LANTUS insulin IF NEEDED.      If your CBG is greater than 220 mg/dL, you may take 1-5 units of your insulin aspart (NOVOLOG).   HOW TO MANAGE YOUR DIABETES BEFORE AND AFTER SURGERY  Why is it important to control my blood sugar before and after surgery? Improving blood sugar levels before and after surgery helps healing and can limit problems. A way of improving blood sugar control is eating a healthy diet by:  Eating less sugar and carbohydrates  Increasing activity/exercise  Talking with your doctor about reaching your blood sugar goals High blood sugars (greater than 180 mg/dL) can raise your risk of infections and slow your recovery, so you will need to focus on controlling your diabetes during the weeks before surgery. Make sure that the doctor who takes care of your diabetes knows about your planned surgery including the  date and location.  How do I manage my blood sugar before surgery? Check your blood sugar at least 4 times a day, starting 2 days before surgery, to make sure that the level is not too high or low.  Check your blood sugar the morning of your surgery when you wake up and every 2 hours until you get to the Short Stay unit.  If your blood sugar is less than 70 mg/dL, you will need to treat for low blood sugar: Do not take insulin. Treat a low blood sugar (less than 70 mg/dL) with  cup of clear juice (cranberry or apple), 4 glucose tablets, OR glucose gel. Recheck blood sugar in 15 minutes after treatment (to make sure it is greater than 70 mg/dL). If your blood sugar is not greater than 70 mg/dL on recheck, call [phone] for further instructions. Report your blood sugar to the short stay nurse when you get to Short Stay.  If you are admitted to the hospital after surgery: Your blood sugar will be checked by the staff and you will probably be given insulin after surgery (instead of oral diabetes medicines) to make sure you have good blood sugar levels. The goal for blood sugar control after surgery is 80-180 mg/dL.                      Do NOT Smoke (Tobacco/Vaping) for 24 hours prior to your procedure.  If you use a CPAP at night, you may bring your mask/headgear  for your overnight stay.   Contacts, glasses, piercing's, hearing aid's, dentures or partials may not be worn into surgery, please bring cases for these belongings.    For patients admitted to the hospital, discharge time will be determined by your treatment team.   Patients discharged the day of surgery will not be allowed to drive home, and someone needs to stay with them for 24 hours.  SURGICAL WAITING ROOM VISITATION Patients having surgery or a procedure may have two support people in the waiting area. Visitors may stay in the waiting area during the procedure and switch out with other visitors if needed. Only 1 support  person is allowed in the pre-op area with the patient AFTER the patient is prepped. This person cannot be switched out. Children under the age of 64 must have an adult accompany them who is not the patient. If the patient needs to stay at the hospital during part of their recovery, the visitor guidelines for inpatient rooms apply.  Please refer to the [facility] website for the visitor guidelines for Inpatients (after your surgery is over and you are in a regular room).    Special instructions:   [place]- Preparing For Surgery  Before surgery, you can play an important role. Because skin is not sterile, your skin needs to be as free of germs as possible. You can reduce the number of germs on your skin by washing with CHG (chlorahexidine gluconate) Soap before surgery.  CHG is an antiseptic cleaner which kills germs and bonds with the skin to continue killing germs even after washing.    Oral Hygiene is also important to reduce your risk of infection.  Remember - BRUSH YOUR TEETH THE MORNING OF SURGERY WITH YOUR REGULAR TOOTHPASTE  Please do not use if you have an allergy to CHG or antibacterial soaps. If your skin becomes reddened/irritated stop using the CHG.  Do not shave (including legs and underarms) for at least 48 hours prior to first CHG shower. It is OK to shave your face.  Please follow these instructions carefully.   Shower the NIGHT BEFORE SURGERY and the MORNING OF SURGERY  If you chose to wash your hair, wash your hair first as usual with your normal shampoo.  After you shampoo, rinse your hair and body thoroughly to remove the shampoo.  Use CHG Soap as you would any other liquid soap. You can apply CHG directly to the skin and wash gently with a scrungie or a clean washcloth.   Apply the CHG Soap to your body ONLY FROM THE NECK DOWN.  Do not use on open wounds or open sores. Avoid contact with your eyes, ears, mouth and genitals (private parts). Wash Face and genitals  (private parts)  with your normal soap.   Wash thoroughly, paying special attention to the area where your surgery will be performed.  Thoroughly rinse your body with warm water from the neck down.  DO NOT shower/wash with your normal soap after using and rinsing off the CHG Soap.  Pat yourself dry with a CLEAN TOWEL.  Wear CLEAN PAJAMAS to bed the night before surgery  Place CLEAN SHEETS on your bed the night before your surgery  DO NOT SLEEP WITH PETS.   Day of Surgery: Take a shower with CHG soap. Do not wear jewelry. Do not wear lotions, powders, colognes, or deodorant. Do not shave 48 hours prior to surgery.  Men may shave face and neck. Wear Clean/Comfortable clothing the morning of surgery  Do not bring valuables to the hospital.  [facility] is not responsible for any belongings or valuables. Remember to brush your teeth WITH YOUR REGULAR TOOTHPASTE.   Please read over the following fact sheets that you were given.  If you received a COVID test during your pre-op visit  it is requested that you wear a mask when out in public, stay away from anyone that may not be feeling well and notify your surgeon if you develop symptoms. If you have been in contact with anyone that has tested positive in the last 10 days please notify you surgeon.

## 2022-10-31 ENCOUNTER — Other Ambulatory Visit: Payer: Self-pay

## 2022-10-31 ENCOUNTER — Encounter (HOSPITAL_COMMUNITY): Payer: Self-pay

## 2022-10-31 ENCOUNTER — Encounter (HOSPITAL_COMMUNITY)
Admission: RE | Admit: 2022-10-31 | Discharge: 2022-10-31 | Disposition: A | Discharge status: Home / Self Care | Payer: Medicaid Other | Source: Ambulatory Visit | Attending: Neurosurgery | Admitting: Neurosurgery

## 2022-10-31 VITALS — BP 174/103 | HR 69 | Temp 97.7°F | Resp 18 | Ht 69.0 in | Wt 163.8 lb

## 2022-10-31 DIAGNOSIS — Z01812 Encounter for preprocedural laboratory examination: Secondary | ICD-10-CM | POA: Diagnosis present

## 2022-10-31 DIAGNOSIS — E119 Type 2 diabetes mellitus without complications: Secondary | ICD-10-CM | POA: Diagnosis not present

## 2022-10-31 DIAGNOSIS — Z794 Long term (current) use of insulin: Secondary | ICD-10-CM | POA: Insufficient documentation

## 2022-10-31 DIAGNOSIS — B182 Chronic viral hepatitis C: Secondary | ICD-10-CM | POA: Insufficient documentation

## 2022-10-31 DIAGNOSIS — Z01818 Encounter for other preprocedural examination: Secondary | ICD-10-CM

## 2022-10-31 HISTORY — DX: Polyneuropathy, unspecified: G62.9

## 2022-10-31 HISTORY — DX: Unspecified osteoarthritis, unspecified site: M19.90

## 2022-10-31 HISTORY — DX: Inflammatory liver disease, unspecified: K75.9

## 2022-10-31 HISTORY — DX: Gastro-esophageal reflux disease without esophagitis: K21.9

## 2022-10-31 LAB — COMPREHENSIVE METABOLIC PANEL
ALT: 9 U/L (ref 0–44)
AST: 14 U/L — ABNORMAL LOW (ref 15–41)
Albumin: 3.6 g/dL (ref 3.5–5.0)
Alkaline Phosphatase: 54 U/L (ref 38–126)
Anion gap: 9 (ref 5–15)
BUN: 43 mg/dL — ABNORMAL HIGH (ref 8–23)
CO2: 24 mmol/L (ref 22–32)
Calcium: 9.1 mg/dL (ref 8.9–10.3)
Chloride: 106 mmol/L (ref 98–111)
Creatinine, Ser: 2.45 mg/dL — ABNORMAL HIGH (ref 0.61–1.24)
GFR, Estimated: 29 mL/min — ABNORMAL LOW (ref >60)
Glucose, Bld: 138 mg/dL — ABNORMAL HIGH (ref 70–99)
Potassium: 5 mmol/L (ref 3.5–5.1)
Sodium: 139 mmol/L (ref 135–145)
Total Bilirubin: 0.8 mg/dL (ref 0.3–1.2)
Total Protein: 6.5 g/dL (ref 6.5–8.1)

## 2022-10-31 LAB — CBC
HCT: 37.9 % — ABNORMAL LOW (ref 39.0–52.0)
Hemoglobin: 12.5 g/dL — ABNORMAL LOW (ref 13.0–17.0)
MCH: 29.9 pg (ref 26.0–34.0)
MCHC: 33 g/dL (ref 30.0–36.0)
MCV: 90.7 fL (ref 80.0–100.0)
Platelets: 267 10*3/uL (ref 150–400)
RBC: 4.18 MIL/uL — ABNORMAL LOW (ref 4.22–5.81)
RDW: 12.8 % (ref 11.5–15.5)
WBC: 9 10*3/uL (ref 4.0–10.5)
nRBC: 0 % (ref 0.0–0.2)

## 2022-10-31 LAB — TYPE AND SCREEN
ABO/RH(D): A POS
Antibody Screen: NEGATIVE

## 2022-10-31 LAB — HEMOGLOBIN A1C
Hgb A1c MFr Bld: 5.9 % — ABNORMAL HIGH (ref 4.8–5.6)
Mean Plasma Glucose: 122.63 mg/dL

## 2022-10-31 LAB — GLUCOSE, CAPILLARY: Glucose-Capillary: 129 mg/dL — ABNORMAL HIGH (ref 70–99)

## 2022-10-31 LAB — SURGICAL PCR SCREEN
MRSA, PCR: NEGATIVE
Staphylococcus aureus: POSITIVE — AB

## 2022-10-31 NOTE — Progress Notes (Signed)
PCP - Dr.Michelle Proffitt Cardiologist - pt denies Nephrologist:Dr.Manpreet Bhutani  PPM/ICD - pt denies Device Orders - n/a Rep Notified - n/a  Chest x-ray - n/a EKG - 02/26/22 Stress Test - 10-15 years ago in [place] normal exam per pt- ECHO - pt denies Cardiac Cath - pt denies  Sleep Study - pt denies CPAP - n/a  Fasting Blood Sugar - 102-114 fasting per pt Checks Blood Sugar ___2-3__ times a day  Last dose of GLP1 agonist-  pt denies GLP1 instructions: n/a  Blood Thinner Instructions:pt denies Aspirin Instructions:pt denies  ERAS Protcol -NPO PRE-SURGERY Ensure or G2- n/a  COVID TEST- n/a   Anesthesia review: YES, patient has surgery clearance see OV 10/03/22  Patient denies shortness of breath, fever, cough and chest pain at PAT appointment   All instructions explained to the patient, with a verbal understanding of the material. Patient agrees to go over the instructions while at home for a better understanding. Patient also instructed to self quarantine after being tested for COVID-19. The opportunity to ask questions was provided.

## 2022-10-31 NOTE — Progress Notes (Signed)
Abnormal Labs at PAT visit on 10/31/22 Creatine and BUN reported to "[facility]" surgical scheduler at [facility] office.

## 2022-11-04 NOTE — Progress Notes (Signed)
Anesthesia Chart Review:  Pertinent hx includes IDDM2 (A1c 5.9 on 10/31/22), CKD3, HTN, GERD, hepatitis C.  Follows with gastroenterology for hx of hep C, completed 8 weeks of Mavyret 07/2022.  Follows with nephrology for hx of CKD3 secondary to DM2. Baseline creatinine ~1.9.  Pt seen by Carolynn Serve, NP on 10/03/22 for preop eval. Per note, "discussed with the patient per the revised cardiac risk index he is class II and cleared for surgery (prior ekg and labs have been reviewed), paper has been completed to fax to the surgeon, we discussed contacting the surgeons office on monday for scheduling. He has been advised on return precautions and verbalizes understanding."  Hx of C4-T1 posterior cervical fusion.  Proep labs reviewed, creatinine elevated mildly above baseline at 2.45 (most recent prior creatinine 1.90 on 09/19/22), mild anemia with Hgb 12.5, otherwise unremarkable.   EKG 02/26/22: NSR. Rate 73. LAD. Septal infarct, age undetermined.    [place], [place] Short Stay Center/Anesthesiology Phone [phone] 11/04/2022 11:02 AM

## 2022-11-04 NOTE — Anesthesia Preprocedure Evaluation (Signed)
Anesthesia Evaluation  Patient identified by MRN, date of birth, ID band Patient awake    Reviewed: Allergy & Precautions, NPO status , Patient's Chart, lab work & pertinent test results  Airway Mallampati: III  TM Distance: >3 FB Neck ROM: Limited    Dental  (+) Poor Dentition, Missing   Pulmonary Current Smoker and Patient abstained from smoking.   Pulmonary exam normal        Cardiovascular hypertension, Pt. on medications  Rhythm:Regular Rate:Normal     Neuro/Psych negative neurological ROS  negative psych ROS   GI/Hepatic ,GERD  Medicated,,  Endo/Other  diabetes, Type 2, Insulin Dependent    Renal/GU CRFRenal disease  negative genitourinary   Musculoskeletal  (+) Arthritis , Osteoarthritis,    Abdominal Normal abdominal exam  (+)   Peds  Hematology negative hematology ROS (+)   Anesthesia Other Findings   Reproductive/Obstetrics                             Anesthesia Physical Anesthesia Plan  ASA: 3  Anesthesia Plan: General   Post-op Pain Management: Ofirmev IV (intra-op)* and Ketamine IV*   Induction: Intravenous  PONV Risk Score and Plan: 1 and Ondansetron, Dexamethasone, Midazolam and Treatment may vary due to age or medical condition  Airway Management Planned: Mask, Oral ETT and Video Laryngoscope Planned  Additional Equipment: None  Intra-op Plan:   Post-operative Plan: Extubation in OR  Informed Consent: I have reviewed the patients History and Physical, chart, labs and discussed the procedure including the risks, benefits and alternatives for the proposed anesthesia with the patient or authorized representative who has indicated his/her understanding and acceptance.     Dental advisory given  Plan Discussed with: CRNA  Anesthesia Plan Comments: (PAT note by Karoline Caldwell, PA-C:  Pertinent hx includes IDDM2 (A1c 5.9 on 10/31/22), CKD3, HTN, GERD, hepatitis  C.  Follows with gastroenterology for hx of hep C, completed 8 weeks of Mavyret 07/2022.  Follows with nephrology for hx of CKD3 secondary to DM2. Baseline creatinine ~1.9.  Pt seen by Carolynn Serve, NP on 10/03/22 for preop eval. Per note, "discussed with the patient per the revised cardiac risk index he is class II and cleared for surgery (prior ekg and labs have been reviewed), paper has been completed to fax to the surgeon, we discussed contacting the surgeons office on monday for scheduling. He has been advised on return precautions and verbalizes understanding."  Hx of C4-T1 posterior cervical fusion.  Proep labs reviewed, creatinine elevated mildly above baseline at 2.45 (most recent prior creatinine 1.90 on 09/19/22), mild anemia with Hgb 12.5, otherwise unremarkable.   EKG 02/26/22: NSR. Rate 73. LAD. Septal infarct, age undetermined.   )        Anesthesia Quick Evaluation

## 2022-11-05 ENCOUNTER — Other Ambulatory Visit: Payer: Self-pay

## 2022-11-05 ENCOUNTER — Ambulatory Visit (HOSPITAL_COMMUNITY): Payer: Medicaid Other

## 2022-11-05 ENCOUNTER — Ambulatory Visit (HOSPITAL_COMMUNITY)
Admission: RE | Admit: 2022-11-05 | Discharge: 2022-11-06 | Disposition: A | Discharge status: Home / Self Care | Payer: Medicaid Other | Attending: Neurosurgery | Admitting: Neurosurgery

## 2022-11-05 ENCOUNTER — Ambulatory Visit (HOSPITAL_BASED_OUTPATIENT_CLINIC_OR_DEPARTMENT_OTHER): Payer: Medicaid Other | Admitting: Physician Assistant

## 2022-11-05 ENCOUNTER — Ambulatory Visit (HOSPITAL_COMMUNITY): Payer: Medicaid Other | Admitting: Physician Assistant

## 2022-11-05 ENCOUNTER — Encounter (HOSPITAL_COMMUNITY): Payer: Self-pay

## 2022-11-05 ENCOUNTER — Encounter (HOSPITAL_COMMUNITY)
Admission: RE | Disposition: A | Discharge status: Home / Self Care | Payer: Self-pay | Source: Home / Self Care | Attending: Neurosurgery

## 2022-11-05 DIAGNOSIS — M4322 Fusion of spine, cervical region: Secondary | ICD-10-CM

## 2022-11-05 DIAGNOSIS — Z794 Long term (current) use of insulin: Secondary | ICD-10-CM | POA: Diagnosis not present

## 2022-11-05 DIAGNOSIS — K219 Gastro-esophageal reflux disease without esophagitis: Secondary | ICD-10-CM | POA: Insufficient documentation

## 2022-11-05 DIAGNOSIS — B182 Chronic viral hepatitis C: Secondary | ICD-10-CM | POA: Insufficient documentation

## 2022-11-05 DIAGNOSIS — E1122 Type 2 diabetes mellitus with diabetic chronic kidney disease: Secondary | ICD-10-CM

## 2022-11-05 DIAGNOSIS — Z79899 Other long term (current) drug therapy: Secondary | ICD-10-CM | POA: Diagnosis not present

## 2022-11-05 DIAGNOSIS — I129 Hypertensive chronic kidney disease with stage 1 through stage 4 chronic kidney disease, or unspecified chronic kidney disease: Secondary | ICD-10-CM

## 2022-11-05 DIAGNOSIS — Z981 Arthrodesis status: Secondary | ICD-10-CM

## 2022-11-05 DIAGNOSIS — N189 Chronic kidney disease, unspecified: Secondary | ICD-10-CM | POA: Diagnosis not present

## 2022-11-05 DIAGNOSIS — F17210 Nicotine dependence, cigarettes, uncomplicated: Secondary | ICD-10-CM | POA: Diagnosis not present

## 2022-11-05 DIAGNOSIS — Z01818 Encounter for other preprocedural examination: Secondary | ICD-10-CM

## 2022-11-05 DIAGNOSIS — N183 Chronic kidney disease, stage 3 unspecified: Secondary | ICD-10-CM | POA: Diagnosis not present

## 2022-11-05 DIAGNOSIS — M960 Pseudarthrosis after fusion or arthrodesis: Secondary | ICD-10-CM | POA: Insufficient documentation

## 2022-11-05 DIAGNOSIS — K589 Irritable bowel syndrome without diarrhea: Secondary | ICD-10-CM | POA: Insufficient documentation

## 2022-11-05 HISTORY — PX: ANTERIOR CERVICAL DECOMP/DISCECTOMY FUSION: SHX1161

## 2022-11-05 LAB — GLUCOSE, CAPILLARY
Glucose-Capillary: 120 mg/dL — ABNORMAL HIGH (ref 70–99)
Glucose-Capillary: 122 mg/dL — ABNORMAL HIGH (ref 70–99)
Glucose-Capillary: 136 mg/dL — ABNORMAL HIGH (ref 70–99)
Glucose-Capillary: 141 mg/dL — ABNORMAL HIGH (ref 70–99)
Glucose-Capillary: 151 mg/dL — ABNORMAL HIGH (ref 70–99)
Glucose-Capillary: 187 mg/dL — ABNORMAL HIGH (ref 70–99)

## 2022-11-05 LAB — ABO/RH: ABO/RH(D): A POS

## 2022-11-05 SURGERY — ANTERIOR CERVICAL DECOMPRESSION/DISCECTOMY FUSION 1 LEVEL
Anesthesia: General

## 2022-11-05 MED ORDER — OXYCODONE HCL 5 MG PO TABS: 5.0000 mg | ORAL_TABLET | ORAL | Status: DC | PRN

## 2022-11-05 MED ORDER — ACETAMINOPHEN 650 MG RE SUPP: 650.0000 mg | RECTAL | Status: DC | PRN

## 2022-11-05 MED ORDER — LIDOCAINE-EPINEPHRINE 1 %-1:100000 IJ SOLN
INTRAMUSCULAR | Status: AC
  Filled 2022-11-05: qty 1

## 2022-11-05 MED ORDER — HYDRALAZINE HCL 20 MG/ML IJ SOLN
5.0000 mg | Freq: Once | INTRAMUSCULAR | Status: AC
  Administered 2022-11-05: 5 mg via INTRAVENOUS

## 2022-11-05 MED ORDER — PHENYLEPHRINE 80 MCG/ML (10ML) SYRINGE FOR IV PUSH (FOR BLOOD PRESSURE SUPPORT)
PREFILLED_SYRINGE | INTRAVENOUS | Status: DC | PRN
  Administered 2022-11-05: 80 ug via INTRAVENOUS
  Administered 2022-11-05 (×2): 160 ug via INTRAVENOUS
  Administered 2022-11-05: 80 ug via INTRAVENOUS

## 2022-11-05 MED ORDER — PROPOFOL 10 MG/ML IV BOLUS
INTRAVENOUS | Status: AC
  Filled 2022-11-05: qty 20

## 2022-11-05 MED ORDER — ONDANSETRON HCL 4 MG/2ML IJ SOLN
INTRAMUSCULAR | Status: DC | PRN
  Administered 2022-11-05: 4 mg via INTRAVENOUS

## 2022-11-05 MED ORDER — ACETAMINOPHEN 10 MG/ML IV SOLN
INTRAVENOUS | Status: DC | PRN
  Administered 2022-11-05: 1000 mg via INTRAVENOUS

## 2022-11-05 MED ORDER — THROMBIN 5000 UNITS EX SOLR
CUTANEOUS | Status: AC
  Filled 2022-11-05: qty 5000

## 2022-11-05 MED ORDER — ONDANSETRON HCL 4 MG/2ML IJ SOLN: 4.0000 mg | Freq: Four times a day (QID) | INTRAMUSCULAR | Status: DC | PRN

## 2022-11-05 MED ORDER — LIDOCAINE 2% (20 MG/ML) 5 ML SYRINGE
INTRAMUSCULAR | Status: AC
  Filled 2022-11-05: qty 5

## 2022-11-05 MED ORDER — LOSARTAN POTASSIUM 50 MG PO TABS: 100.0000 mg | ORAL_TABLET | Freq: Every day | ORAL | Status: DC

## 2022-11-05 MED ORDER — LABETALOL HCL 5 MG/ML IV SOLN
INTRAVENOUS | Status: AC
  Filled 2022-11-05: qty 4

## 2022-11-05 MED ORDER — PANTOPRAZOLE SODIUM 40 MG PO TBEC
40.0000 mg | DELAYED_RELEASE_TABLET | Freq: Every day | ORAL | Status: DC
  Administered 2022-11-05 – 2022-11-06 (×2): 40 mg via ORAL
  Filled 2022-11-05 (×2): qty 1

## 2022-11-05 MED ORDER — FLEET ENEMA 7-19 GM/118ML RE ENEM: 1.0000 | ENEMA | Freq: Once | RECTAL | Status: DC | PRN

## 2022-11-05 MED ORDER — KETAMINE HCL 50 MG/5ML IJ SOSY
PREFILLED_SYRINGE | INTRAMUSCULAR | Status: AC
  Filled 2022-11-05: qty 5

## 2022-11-05 MED ORDER — SODIUM CHLORIDE 0.9 % IV SOLN: INTRAVENOUS | Status: DC

## 2022-11-05 MED ORDER — LIDOCAINE-EPINEPHRINE 1 %-1:100000 IJ SOLN
INTRAMUSCULAR | Status: DC | PRN
  Administered 2022-11-05: 6 mL

## 2022-11-05 MED ORDER — METHOCARBAMOL 1000 MG/10ML IJ SOLN: 500.0000 mg | Freq: Four times a day (QID) | INTRAVENOUS | Status: DC | PRN

## 2022-11-05 MED ORDER — FENTANYL CITRATE (PF) 100 MCG/2ML IJ SOLN
25.0000 ug | INTRAMUSCULAR | Status: DC | PRN
  Administered 2022-11-05 (×2): 25 ug via INTRAVENOUS

## 2022-11-05 MED ORDER — ACETAMINOPHEN 10 MG/ML IV SOLN: 1000.0000 mg | Freq: Once | INTRAVENOUS | Status: DC | PRN

## 2022-11-05 MED ORDER — CHLORHEXIDINE GLUCONATE CLOTH 2 % EX PADS: 6.0000 | MEDICATED_PAD | Freq: Once | CUTANEOUS | Status: DC

## 2022-11-05 MED ORDER — DOCUSATE SODIUM 100 MG PO CAPS
100.0000 mg | ORAL_CAPSULE | Freq: Two times a day (BID) | ORAL | Status: DC
  Administered 2022-11-05 – 2022-11-06 (×2): 100 mg via ORAL
  Filled 2022-11-05 (×2): qty 1

## 2022-11-05 MED ORDER — FLUTICASONE PROPIONATE 50 MCG/ACT NA SUSP
2.0000 | Freq: Every day | NASAL | Status: DC
  Administered 2022-11-05: 2 via NASAL
  Filled 2022-11-05: qty 16

## 2022-11-05 MED ORDER — INSULIN GLARGINE-YFGN 100 UNIT/ML ~~LOC~~ SOLN
5.0000 [IU] | Freq: Every day | SUBCUTANEOUS | Status: DC
  Administered 2022-11-06: 5 [IU] via SUBCUTANEOUS
  Filled 2022-11-05 (×3): qty 0.05

## 2022-11-05 MED ORDER — LIDOCAINE 2% (20 MG/ML) 5 ML SYRINGE
INTRAMUSCULAR | Status: DC | PRN
  Administered 2022-11-05: 60 mg via INTRAVENOUS

## 2022-11-05 MED ORDER — THROMBIN 5000 UNITS EX SOLR: OROMUCOSAL | Status: DC | PRN

## 2022-11-05 MED ORDER — FENTANYL CITRATE (PF) 100 MCG/2ML IJ SOLN
INTRAMUSCULAR | Status: AC
  Filled 2022-11-05: qty 2

## 2022-11-05 MED ORDER — CEFAZOLIN SODIUM-DEXTROSE 1-4 GM/50ML-% IV SOLN
1.0000 g | Freq: Three times a day (TID) | INTRAVENOUS | Status: DC
  Administered 2022-11-05 – 2022-11-06 (×2): 1 g via INTRAVENOUS
  Filled 2022-11-05 (×2): qty 50

## 2022-11-05 MED ORDER — LACTATED RINGERS IV SOLN: INTRAVENOUS | Status: DC

## 2022-11-05 MED ORDER — PHENOL 1.4 % MT LIQD: 1.0000 | OROMUCOSAL | Status: DC | PRN

## 2022-11-05 MED ORDER — INSULIN ASPART 100 UNIT/ML IJ SOLN
2.0000 [IU] | Freq: Three times a day (TID) | INTRAMUSCULAR | Status: DC | PRN
  Administered 2022-11-05: 2 [IU] via SUBCUTANEOUS

## 2022-11-05 MED ORDER — INSULIN ASPART 100 UNIT/ML IJ SOLN: 0.0000 [IU] | INTRAMUSCULAR | Status: DC | PRN

## 2022-11-05 MED ORDER — ROCURONIUM BROMIDE 10 MG/ML (PF) SYRINGE
PREFILLED_SYRINGE | INTRAVENOUS | Status: DC | PRN
  Administered 2022-11-05: 60 mg via INTRAVENOUS
  Administered 2022-11-05: 20 mg via INTRAVENOUS

## 2022-11-05 MED ORDER — MENTHOL 3 MG MT LOZG
1.0000 | LOZENGE | OROMUCOSAL | Status: DC | PRN
  Administered 2022-11-06: 3 mg via ORAL
  Filled 2022-11-05: qty 9

## 2022-11-05 MED ORDER — ORAL CARE MOUTH RINSE: 15.0000 mL | Freq: Once | OROMUCOSAL | Status: DC

## 2022-11-05 MED ORDER — MIDAZOLAM HCL 2 MG/2ML IJ SOLN
INTRAMUSCULAR | Status: DC | PRN
  Administered 2022-11-05: 2 mg via INTRAVENOUS

## 2022-11-05 MED ORDER — DEXAMETHASONE SODIUM PHOSPHATE 10 MG/ML IJ SOLN
INTRAMUSCULAR | Status: AC
  Filled 2022-11-05: qty 2

## 2022-11-05 MED ORDER — HYDROMORPHONE HCL 1 MG/ML IJ SOLN: 1.0000 mg | INTRAMUSCULAR | Status: DC | PRN

## 2022-11-05 MED ORDER — SODIUM CHLORIDE 0.9% FLUSH
3.0000 mL | Freq: Two times a day (BID) | INTRAVENOUS | Status: DC
  Administered 2022-11-05: 3 mL via INTRAVENOUS

## 2022-11-05 MED ORDER — ONDANSETRON HCL 4 MG PO TABS: 4.0000 mg | ORAL_TABLET | Freq: Four times a day (QID) | ORAL | Status: DC | PRN

## 2022-11-05 MED ORDER — SODIUM CHLORIDE 0.9% FLUSH: 3.0000 mL | INTRAVENOUS | Status: DC | PRN

## 2022-11-05 MED ORDER — FENTANYL CITRATE (PF) 250 MCG/5ML IJ SOLN
INTRAMUSCULAR | Status: DC | PRN
  Administered 2022-11-05: 100 ug via INTRAVENOUS
  Administered 2022-11-05: 50 ug via INTRAVENOUS

## 2022-11-05 MED ORDER — POTASSIUM CHLORIDE IN NACL 20-0.9 MEQ/L-% IV SOLN: INTRAVENOUS | Status: DC

## 2022-11-05 MED ORDER — GABAPENTIN 300 MG PO CAPS: 600.0000 mg | ORAL_CAPSULE | Freq: Three times a day (TID) | ORAL | Status: DC

## 2022-11-05 MED ORDER — MIDAZOLAM HCL 2 MG/2ML IJ SOLN
INTRAMUSCULAR | Status: AC
  Filled 2022-11-05: qty 2

## 2022-11-05 MED ORDER — GABAPENTIN 300 MG PO CAPS
600.0000 mg | ORAL_CAPSULE | Freq: Two times a day (BID) | ORAL | Status: DC
  Administered 2022-11-05 – 2022-11-06 (×2): 600 mg via ORAL
  Filled 2022-11-05 (×2): qty 2

## 2022-11-05 MED ORDER — CHLORHEXIDINE GLUCONATE 0.12 % MT SOLN
15.0000 mL | Freq: Once | OROMUCOSAL | Status: AC
  Administered 2022-11-05: 15 mL via OROMUCOSAL
  Filled 2022-11-05: qty 15

## 2022-11-05 MED ORDER — ROCURONIUM BROMIDE 10 MG/ML (PF) SYRINGE
PREFILLED_SYRINGE | INTRAVENOUS | Status: AC
  Filled 2022-11-05: qty 10

## 2022-11-05 MED ORDER — METHOCARBAMOL 500 MG PO TABS
500.0000 mg | ORAL_TABLET | Freq: Four times a day (QID) | ORAL | Status: DC | PRN
  Administered 2022-11-05 (×2): 500 mg via ORAL
  Filled 2022-11-05 (×3): qty 1

## 2022-11-05 MED ORDER — SUGAMMADEX SODIUM 200 MG/2ML IV SOLN
INTRAVENOUS | Status: DC | PRN
  Administered 2022-11-05: 200 mg via INTRAVENOUS

## 2022-11-05 MED ORDER — ORAL CARE MOUTH RINSE: 15.0000 mL | Freq: Once | OROMUCOSAL | Status: AC

## 2022-11-05 MED ORDER — FENTANYL CITRATE (PF) 250 MCG/5ML IJ SOLN
INTRAMUSCULAR | Status: AC
  Filled 2022-11-05: qty 5

## 2022-11-05 MED ORDER — KETAMINE HCL 10 MG/ML IJ SOLN
INTRAMUSCULAR | Status: DC | PRN
  Administered 2022-11-05: 15 mg via INTRAVENOUS
  Administered 2022-11-05: 10 mg via INTRAVENOUS
  Administered 2022-11-05: 25 mg via INTRAVENOUS

## 2022-11-05 MED ORDER — 0.9 % SODIUM CHLORIDE (POUR BTL) OPTIME
TOPICAL | Status: DC | PRN
  Administered 2022-11-05: 1000 mL

## 2022-11-05 MED ORDER — DEXAMETHASONE SODIUM PHOSPHATE 10 MG/ML IJ SOLN
INTRAMUSCULAR | Status: DC | PRN
  Administered 2022-11-05: 5 mg via INTRAVENOUS

## 2022-11-05 MED ORDER — SODIUM CHLORIDE 0.9 % IV SOLN
250.0000 mL | INTRAVENOUS | Status: DC
  Administered 2022-11-05: 250 mL via INTRAVENOUS

## 2022-11-05 MED ORDER — ONDANSETRON HCL 4 MG/2ML IJ SOLN
INTRAMUSCULAR | Status: AC
  Filled 2022-11-05: qty 4

## 2022-11-05 MED ORDER — ACETAMINOPHEN 325 MG PO TABS
650.0000 mg | ORAL_TABLET | ORAL | Status: DC | PRN
  Administered 2022-11-05 – 2022-11-06 (×2): 650 mg via ORAL
  Filled 2022-11-05 (×2): qty 2

## 2022-11-05 MED ORDER — HYDRALAZINE HCL 20 MG/ML IJ SOLN
INTRAMUSCULAR | Status: AC
  Filled 2022-11-05: qty 1

## 2022-11-05 MED ORDER — CHLORHEXIDINE GLUCONATE 0.12 % MT SOLN: 15.0000 mL | Freq: Once | OROMUCOSAL | Status: DC

## 2022-11-05 MED ORDER — CEFAZOLIN SODIUM-DEXTROSE 2-4 GM/100ML-% IV SOLN
2.0000 g | INTRAVENOUS | Status: AC
  Administered 2022-11-05: 2 g via INTRAVENOUS
  Filled 2022-11-05: qty 100

## 2022-11-05 MED ORDER — MORPHINE SULFATE 15 MG PO TABS: 15.0000 mg | ORAL_TABLET | Freq: Four times a day (QID) | ORAL | Status: DC | PRN

## 2022-11-05 MED ORDER — ACETAMINOPHEN 10 MG/ML IV SOLN
INTRAVENOUS | Status: AC
  Filled 2022-11-05: qty 100

## 2022-11-05 MED ORDER — PROPOFOL 10 MG/ML IV BOLUS
INTRAVENOUS | Status: DC | PRN
  Administered 2022-11-05: 160 mg via INTRAVENOUS

## 2022-11-05 MED ORDER — OXYCODONE HCL 5 MG PO TABS
10.0000 mg | ORAL_TABLET | ORAL | Status: DC | PRN
  Administered 2022-11-05 – 2022-11-06 (×5): 10 mg via ORAL
  Filled 2022-11-05 (×6): qty 2

## 2022-11-05 SURGICAL SUPPLY — 66 items
BAG COUNTER SPONGE SURGICOUNT (BAG) ×1 IMPLANT
BAND RUBBER #18 3X1/16 STRL (MISCELLANEOUS) ×2 IMPLANT
BASKET BONE COLLECTION (BASKET) IMPLANT
BENZOIN TINCTURE PRP APPL 2/3 (GAUZE/BANDAGES/DRESSINGS) ×1 IMPLANT
BIT DRILL NEURO 2X3.1 SFT TUCH (MISCELLANEOUS) ×1 IMPLANT
BIT DRILL OZARK SU 2.3X16 (DRILL) IMPLANT
BLADE CLIPPER SURG (BLADE) IMPLANT
BLADE SURG 15 STRL LF DISP TIS (BLADE) IMPLANT
BLADE SURG 15 STRL SS (BLADE)
BLADE ULTRA TIP 2M (BLADE) IMPLANT
BUR MATCHSTICK NEURO 3.0 LAGG (BURR) ×1 IMPLANT
CANISTER SUCT 3000ML PPV (MISCELLANEOUS) ×1 IMPLANT
COLLAR CERV LO CONTOUR FIRM DE (SOFTGOODS) ×1 IMPLANT
DRAPE C-ARM 42X72 X-RAY (DRAPES) ×2 IMPLANT
DRAPE LAPAROTOMY 100X72 PEDS (DRAPES) ×1 IMPLANT
DRAPE MICROSCOPE SLANT 54X150 (MISCELLANEOUS) ×1 IMPLANT
DRAPE SHEET LG 3/4 BI-LAMINATE (DRAPES) ×1 IMPLANT
DRESSING MEPILEX FLEX 4X4 (GAUZE/BANDAGES/DRESSINGS) IMPLANT
DRILL NEURO 2X3.1 SOFT TOUCH (MISCELLANEOUS) ×1
DRILL OZARK SU 2.3X16 (DRILL) ×1
DRSG MEPILEX FLEX 4X4 (GAUZE/BANDAGES/DRESSINGS)
DRSG OPSITE 4X5.5 SM (GAUZE/BANDAGES/DRESSINGS) ×2 IMPLANT
DRSG OPSITE POSTOP 3X4 (GAUZE/BANDAGES/DRESSINGS) ×1 IMPLANT
DURAPREP 26ML APPLICATOR (WOUND CARE) ×1 IMPLANT
ELECT COATED BLADE 2.86 ST (ELECTRODE) ×1 IMPLANT
ELECT REM PT RETURN 9FT ADLT (ELECTROSURGICAL) ×1
ELECTRODE REM PT RTRN 9FT ADLT (ELECTROSURGICAL) ×1 IMPLANT
GAUZE 4X4 16PLY ~~LOC~~+RFID DBL (SPONGE) IMPLANT
GLOVE BIOGEL PI IND STRL 7.5 (GLOVE) ×1 IMPLANT
GLOVE ECLIPSE 7.5 STRL STRAW (GLOVE) ×1 IMPLANT
GLOVE SURG ENC MOIS LTX SZ8 (GLOVE) ×1 IMPLANT
GLOVE SURG UNDER POLY LF SZ8.5 (GLOVE) ×1 IMPLANT
GOWN STRL REUS W/ TWL LRG LVL3 (GOWN DISPOSABLE) ×2 IMPLANT
GOWN STRL REUS W/ TWL XL LVL3 (GOWN DISPOSABLE) ×2 IMPLANT
GOWN STRL REUS W/TWL 2XL LVL3 (GOWN DISPOSABLE) IMPLANT
GOWN STRL REUS W/TWL LRG LVL3 (GOWN DISPOSABLE) ×2
GOWN STRL REUS W/TWL XL LVL3 (GOWN DISPOSABLE) ×2
HEMOSTAT POWDER KIT SURGIFOAM (HEMOSTASIS) ×1 IMPLANT
KIT BASIN OR (CUSTOM PROCEDURE TRAY) ×1 IMPLANT
KIT TURNOVER KIT B (KITS) ×1 IMPLANT
NDL SPNL 22GX3.5 QUINCKE BK (NEEDLE) ×1 IMPLANT
NEEDLE HYPO 22GX1.5 SAFETY (NEEDLE) ×1 IMPLANT
NEEDLE SPNL 22GX3.5 QUINCKE BK (NEEDLE) ×1 IMPLANT
NS IRRIG 1000ML POUR BTL (IV SOLUTION) ×1 IMPLANT
PACK LAMINECTOMY NEURO (CUSTOM PROCEDURE TRAY) ×1 IMPLANT
PAD ARMBOARD 7.5X6 YLW CONV (MISCELLANEOUS) ×3 IMPLANT
PATTIES SURGICAL .5 X3 (DISPOSABLE) ×1 IMPLANT
PIN DISTRACTION 14MM (PIN) ×2 IMPLANT
PLATE CERV CONS OZARK 1X20 (Plate) IMPLANT
PUTTY BONE 100 VESUVIUS 1CC (Putty) IMPLANT
SCREW VA ST OZARK 4X16 (Plate) IMPLANT
SPACER ANGLD CASCAD 16X13X7 7D (Spacer) IMPLANT
SPIKE FLUID TRANSFER (MISCELLANEOUS) ×1 IMPLANT
SPONGE INTESTINAL PEANUT (DISPOSABLE) ×1 IMPLANT
SPONGE SURGIFOAM ABS GEL SZ50 (HEMOSTASIS) IMPLANT
STAPLER VISISTAT 35W (STAPLE) IMPLANT
STRIP CLOSURE SKIN 1/2X4 (GAUZE/BANDAGES/DRESSINGS) ×1 IMPLANT
SUT MNCRL AB 4-0 PS2 18 (SUTURE) ×1 IMPLANT
SUT SILK 2 0 TIES 10X30 (SUTURE) IMPLANT
SUT VIC AB 0 CT1 27 (SUTURE)
SUT VIC AB 0 CT1 27XBRD ANTBC (SUTURE) IMPLANT
SUT VIC AB 3-0 SH 8-18 (SUTURE) ×1 IMPLANT
TAPE CLOTH 3X10 TAN LF (GAUZE/BANDAGES/DRESSINGS) ×1 IMPLANT
TOWEL GREEN STERILE (TOWEL DISPOSABLE) ×1 IMPLANT
TOWEL GREEN STERILE FF (TOWEL DISPOSABLE) ×1 IMPLANT
WATER STERILE IRR 1000ML POUR (IV SOLUTION) ×1 IMPLANT

## 2022-11-05 NOTE — Transfer of Care (Signed)
Immediate Anesthesia Transfer of Care Note  Patient: Curtis Hartman  Procedure(s) Performed: Anterior Cervical Decompression/discectomy Fusion Cervical Four-Cervical Five  Patient Location: PACU  Anesthesia Type:General  Level of Consciousness: awake and patient cooperative  Airway & Oxygen Therapy: Patient Spontanous Breathing and Patient connected to face mask oxygen  Post-op Assessment: Report given to RN, Post -op Vital signs reviewed and stable, and Patient moving all extremities  Post vital signs: Reviewed and stable  Last Vitals:  Vitals Value Taken Time  BP 150/118 11/05/22 1521  Temp    Pulse 68 11/05/22 1522  Resp 17 11/05/22 1522  SpO2 100 % 11/05/22 1522  Vitals shown include unvalidated device data.  Last Pain:  Vitals:   11/05/22 1106  TempSrc:   PainSc: 0-No pain         Complications: No notable events documented.

## 2022-11-05 NOTE — Op Note (Signed)
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 Arthrodesis C4-5, anterior interbody technique, including Discectomy for decompression of spinal cord and exiting nerve roots with foraminotomies  2. Placement of intervertebral biomechanical device C4-5 3. Placement of anterior instrumentation consisting of interbody plate and screws F6-2 4. Use of morselized bone allograft  5. Use of intraoperative microscope  SURGEON: Dr. Duffy Rhody, MD  ASSISTANT: Dr. Newman Pies, MD.  Please note, no qualified trainees were available to assist with the procedure.  Assistance was required for retraction of the visceral structures to safely allow for instrumentation.  ANESTHESIA: General Endotracheal  EBL: 25 ml  IMPLANTS: Stryker 7 x 13 x 16 mm [place] C cage 20 mm Ozark plate 4.0 x 16 mm screws  SPECIMENS: None  DRAINS: None  COMPLICATIONS: None immediate  CONDITION: Hemodynamically stable to PACU  HISTORY: Curtis Hartman is a 62 y.o. y.o. 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  He had loss of cervical lordosis preoperatively.  Postoperatively he did well initially but developed mechanical neck pain and was found to have pseudoarthrosis and progressive kyphosis at C4-5 with screw pullout. Given the progressive nature of his symptoms and progressive kyphosis, I discussed with him the option of ACDF. Risks, benefits, alternatives, and expected convalescence were discussed with the patient.  Risks discussed included but were not limited to bleeding, pain, infection, dysphagia, dysphonia, pseudoarthrosis, hardware failure, adjacent segment disease, CSF leak, neurologic deficits, weakness, numbness, paralysis, coma, and death. After all questions were answered, informed consent was obtained.  PROCEDURE IN DETAIL: The patient was brought to the operating room and  transferred to the operative table. After induction of general anesthesia, the patient was positioned on the operative table in the supine position with all pressure points meticulously padded. The skin of the neck was then prepped and draped in the usual sterile fashion.  After timeout was conducted, the skin was infiltrated with local anesthetic. Skin incision was then made sharply and Bovie electrocautery was used to dissect the subcutaneous tissue until the platysma was identified. The platysma was then divided and undermined. The sternocleidomastoid muscle was then identified and, utilizing natural fascial planes in the neck, the prevertebral fascia was identified and the carotid sheath was retracted laterally and the trachea and esophagus retracted medially. Again using fluoroscopy, the C4-5 disc space was identified. Bovie electrocautery was used to dissect in the subperiosteal plane and elevate the bilateral longus coli muscles. Self-retaining retractors were then placed. Caspar distraction pins were placed in the adjacent bodies to allow for gentle distraction.  At this point, the microscope was draped and brought into the field, and the remainder of the case was done under the microscope using microdissecting technique.  The disc space was incised sharply and combination of high speed drill, curettes, and rongeurs were use to initially complete a discectomy. The high-speed drill was then used to complete discectomy until the posterior annulus was identified and removed and the posterior longitudinal ligament was identified. Using a nerve hook, the PLL was elevated, and Kerrison rongeurs were used to remove the posterior longitudinal ligament and the ventral thecal sac was identified. Using a combination of curettes and rongeurs, complete decompression of the thecal sac and exiting nerve roots at this level was completed, and verified with easy passage of micro-nerve hook centrally and in the bilateral  foramina.  Having completed our decompression, attention was turned to placement of the  intervertebral device. Trial spacers were used to select a size 7 mm graft. This graft was then filled with morcellized allograft, and inserted under live fluoroscopy.  After placement of the intervertebral devices, the caspar pins were removed.  An anterior cervical plate was placed across the interspaces for anterior fixation.  Using a high-speed drill, the cortex of the cervical vertebral bodies was punctured, and screws inserted in the vertebral bodies. Final fluoroscopic images in AP and lateral projections were taken to confirm good hardware placement.  At this point, after all counts were verified to be correct, meticulous hemostasis was secured using a combination of bipolar electrocautery and passive hemostatics. The platysma muscle was then closed using interrupted 3-0 Vicryl sutures, and the skin was closed with a 4-0 monocryl in subcuticular fashion. Sterile dressings were then applied and the drapes removed.  The patient tolerated the procedure well and was extubated in the room and taken to the postanesthesia care unit in stable condition.  All counts were correct at the end of the procedure.

## 2022-11-05 NOTE — H&P (Signed)
CC: cervical stenosis  HPI:     Patient is a 62 y.o. male with hx of C4-T1 posterior fusion developed symptomatic pseudoarthrosis and kyphosis at C4-5.  He is here for elective surgery    Patient Active Problem List   Diagnosis Date Noted   Chronic hepatitis C ([place]) 06/25/2022   IBS (irritable bowel syndrome) 06/25/2022   GERD (gastroesophageal reflux disease) 06/25/2022   Past Medical History:  Diagnosis Date   Arthritis    CKD (chronic kidney disease)    Diabetes mellitus without complication ([place])    type 2   GERD (gastroesophageal reflux disease)    Hepatitis    Hypertension    Neuropathy     Past Surgical History:  Procedure Laterality Date   BIOPSY  03/03/2022   Procedure: BIOPSY;  Surgeon: Eloise Harman, DO;  Location: [facility];  Service: Endoscopy;;   COLONOSCOPY WITH PROPOFOL N/A 03/03/2022   Procedure: COLONOSCOPY WITH PROPOFOL;  Surgeon: Eloise Harman, DO;  Location: [facility];  Service: Endoscopy;  Laterality: N/A;  8:00am   ESOPHAGOGASTRODUODENOSCOPY (EGD) WITH PROPOFOL N/A 03/03/2022   Procedure: ESOPHAGOGASTRODUODENOSCOPY (EGD) WITH PROPOFOL;  Surgeon: Eloise Harman, DO;  Location: [facility];  Service: Endoscopy;  Laterality: N/A;   foot right partial toe amputation Right    FOOT SURGERY     hernia surgery x4     POLYPECTOMY  03/03/2022   Procedure: POLYPECTOMY;  Surgeon: Eloise Harman, DO;  Location: [facility];  Service: Endoscopy;;    Medications Prior to Admission  Medication Sig Dispense Refill Last Dose   fluticasone (FLONASE) 50 MCG/ACT nasal spray Place 2 sprays into both nostrils daily.   11/03/2022   gabapentin (NEURONTIN) 300 MG capsule Take 600 mg by mouth 3 (three) times daily.   11/04/2022   insulin aspart (NOVOLOG) 100 UNIT/ML injection Inject 2-10 Units into the skin 3 (three) times daily as needed (blood sugar over 150).   11/03/2022   LANTUS 100 UNIT/ML injection Inject 5 Units into the skin at bedtime as needed (blood  sugar over 120).   11/03/2022   losartan (COZAAR) 100 MG tablet Take 100 mg by mouth daily.   11/03/2022   morphine (MSIR) 15 MG tablet Take 15 mg by mouth every 6 (six) hours as needed for severe pain.   11/04/2022   pantoprazole (PROTONIX) 40 MG tablet Take 1 tablet (40 mg total) by mouth daily. (Patient taking differently: Take 20 mg by mouth daily as needed (acid reflux).) 30 tablet 5 Past Month   No Known Allergies  Social History   Tobacco Use   Smoking status: Every Day    Packs/day: 0.25    Types: Cigarettes    Passive exposure: Never   Smokeless tobacco: Never  Substance Use Topics   Alcohol use: Not Currently    Family History  Problem Relation Age of Onset   Heart disease Mother    Seizures Father    Stroke Maternal Grandfather    Liver disease Paternal Grandmother      Review of Systems Pertinent items are noted in HPI.  Objective:   Patient Vitals for the past 8 hrs:  BP Temp Temp src Pulse Resp SpO2 Height Weight  11/05/22 1050 (!) 164/82 97.9 F (36.6 C) Oral 73 18 100 % 5' 9.5" (1.765 m) 74.8 kg   No intake/output data recorded. No intake/output data recorded.      General : Alert, cooperative, no distress, appears stated age  Head:  Normocephalic/atraumatic    Eyes: PERRL, conjunctiva/corneas clear, EOM's intact. Fundi could not be visualized Neck: Supple, Incision well-healed Chest:  Respirations unlabored Chest wall: no tenderness or deformity Heart: Regular rate and rhythm Abdomen: Soft, nontender and nondistended Extremities: warm and well-perfused Skin: normal turgor, color and texture Neurologic:  Alert, oriented x 3.  Eyes open spontaneously. PERRL, EOMI, VFC, no facial droop. V1-3 intact.  No dysarthria, tongue protrusion symmetric.  CNII-XII intact. Normal strength, sensation and reflexes throughout.  No pronator drift, full strength in legs       Data ReviewCBC:  Lab Results  Component Value Date   WBC 9.0 10/31/2022   RBC 4.18 (L)  10/31/2022   BMP:  Lab Results  Component Value Date   GLUCOSE 138 (H) 10/31/2022   CO2 24 10/31/2022   BUN 43 (H) 10/31/2022   CREATININE 2.45 (H) 10/31/2022   CALCIUM 9.1 10/31/2022   Radiology review:  Previous C4-T1 posterior fusion.  C4-5 kyphosis and pseudoarthrosis  Assessment:   Active Problems:   * No active hospital problems. 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  Risks discussed included, but were not limited to bleeding, pain, infection, scar, spinal fluid leak, neurologic deficit, dysphagia, dysphonia, instability, pseudoarthrosis, damage to nearby organs, and death.  Informed consent was obtained.

## 2022-11-05 NOTE — Anesthesia Procedure Notes (Signed)
Procedure Name: Intubation Date/Time: 11/05/2022 1:32 PM  Performed by: Elvin So, CRNAPre-anesthesia Checklist: Patient identified, Emergency Drugs available, Suction available and Patient being monitored Patient Re-evaluated:Patient Re-evaluated prior to induction Oxygen Delivery Method: Circle System Utilized Preoxygenation: Pre-oxygenation with 100% oxygen Induction Type: IV induction Ventilation: Mask ventilation without difficulty Laryngoscope Size: Glidescope and 4 Grade View: Grade I Tube type: Oral Tube size: 7.5 mm Number of attempts: 1 Airway Equipment and Method: Stylet and Oral airway Placement Confirmation: ETT inserted through vocal cords under direct vision, positive ETCO2 and breath sounds checked- equal and bilateral Secured at: 22 cm Tube secured with: Tape Dental Injury: Teeth and Oropharynx as per pre-operative assessment

## 2022-11-05 NOTE — Progress Notes (Signed)
Orthopedic Tech Progress Note Patient Details:  Curtis Hartman 1960/12/24 [id number]  PACU RN called requesting an [place], so I called in order to [facility]    Patient ID: Curtis Hartman, male   DOB: Jan 03, 1961, 62 y.o.   MRN: [id number]  Janit Pagan 11/05/2022, 4:33 PM

## 2022-11-06 DIAGNOSIS — M960 Pseudarthrosis after fusion or arthrodesis: Secondary | ICD-10-CM | POA: Diagnosis not present

## 2022-11-06 LAB — GLUCOSE, CAPILLARY: Glucose-Capillary: 124 mg/dL — ABNORMAL HIGH (ref 70–99)

## 2022-11-06 LAB — BASIC METABOLIC PANEL
Anion gap: 13 (ref 5–15)
BUN: 51 mg/dL — ABNORMAL HIGH (ref 8–23)
CO2: 20 mmol/L — ABNORMAL LOW (ref 22–32)
Calcium: 8.9 mg/dL (ref 8.9–10.3)
Chloride: 105 mmol/L (ref 98–111)
Creatinine, Ser: 2.51 mg/dL — ABNORMAL HIGH (ref 0.61–1.24)
GFR, Estimated: 28 mL/min — ABNORMAL LOW (ref >60)
Glucose, Bld: 129 mg/dL — ABNORMAL HIGH (ref 70–99)
Potassium: 4.8 mmol/L (ref 3.5–5.1)
Sodium: 138 mmol/L (ref 135–145)

## 2022-11-06 LAB — HEPATITIS C RNA QUANTITATIVE
HCV Quantitative Log: <1.18 log IU/mL
HCV RNA, PCR, QN: <15 IU/mL

## 2022-11-06 NOTE — Discharge Instructions (Signed)
Wound Care Remove dressing in 3 days Leave incision open to air. You may shower. Do not scrub directly on incision.  Do not put any creams, lotions, or ointments on incision. Activity Walk each and every day, increasing distance each day. No lifting greater than 5 lbs.  Avoid excessive neck motion. No driving for 2 weeks; may ride as a passenger locally. Wear neck brace at all times except when showering.   Resume your normal diet.  Return to Work Will be discussed at you follow up appointment. Call Your Doctor If Any of These Occur Redness, drainage, or swelling at the wound.  Temperature greater than 101 degrees. Severe pain not relieved by pain medication. Increased difficulty swallowing. Incision starts to come apart. Follow Up Appt Call [phone])  for problems.  If you have any hardware placed in your spine, you will need an x-ray before your appointment.

## 2022-11-06 NOTE — Anesthesia Postprocedure Evaluation (Signed)
Anesthesia Post Note  Patient: Curtis Hartman  Procedure(s) Performed: Anterior Cervical Decompression/discectomy Fusion Cervical Four-Cervical Five     Patient location during evaluation: PACU Anesthesia Type: General Level of consciousness: awake and alert Pain management: pain level controlled Vital Signs Assessment: post-procedure vital signs reviewed and stable Respiratory status: spontaneous breathing, nonlabored ventilation, respiratory function stable and patient connected to nasal cannula oxygen Cardiovascular status: blood pressure returned to baseline and stable Postop Assessment: no apparent nausea or vomiting Anesthetic complications: no   No notable events documented.  Last Vitals:  Vitals:   11/05/22 2325 11/06/22 0418  BP: (!) 149/95 (!) 160/87  Pulse: 85 78  Resp: 18 18  Temp: 36.8 C 36.7 C  SpO2: 98% 99%    Last Pain:  Vitals:   11/06/22 0516  TempSrc:   PainSc: Asleep                 March Rummage Calisa Luckenbaugh

## 2022-11-06 NOTE — Evaluation (Signed)
Physical Therapy Evaluation  Patient Details Name: Curtis Hartman MRN: [id number] DOB: [birth year] Today's Date: 11/06/2022  History of Present Illness  Pt is a 62 y/o M who presents s/p C4-C5 ACDF on 11/05/2021.  PMH includes: prior neck surgery, arthtitis, HTN.  Clinical Impression  Pt admitted with above diagnosis. At the time of PT eval, pt was able to demonstrate transfers and ambulation with gross min guard assist and rollator for support. Pt overall with poor maintenance of precautions and flexed trunk throughout session. Pt was educated on precautions, brace application/wearing schedule, appropriate activity progression, and car transfer. Pt currently with functional limitations due to the deficits listed below (see PT Problem List). Pt will benefit from skilled PT to increase their independence and safety with mobility to allow discharge to the venue listed below.         Recommendations for follow up therapy are one component of a multi-disciplinary discharge planning process, led by the attending physician.  Recommendations may be updated based on patient status, additional functional criteria and insurance authorization.  Follow Up Recommendations Home health PT      Assistance Recommended at Discharge Intermittent Supervision/Assistance  Patient can return home with the following  A little help with walking and/or transfers;A little help with bathing/dressing/bathroom;Assistance with cooking/housework;Assist for transportation;Help with stairs or ramp for entrance    Equipment Recommendations None recommended by PT  Recommendations for Other Services       Functional Status Assessment Patient has had a recent decline in their functional status and demonstrates the ability to make significant improvements in function in a reasonable and predictable amount of time.     Precautions / Restrictions Precautions Precautions: Cervical Precaution Booklet Issued: Yes  (comment) Precaution Comments: Reviewed handout and pt was cued for precautions during functional mobility. Required Braces or Orthoses: Cervical Brace Cervical Brace: Hard collar;For comfort Restrictions Weight Bearing Restrictions: No      Mobility  Bed Mobility Overal bed mobility: Needs Assistance Bed Mobility: Sidelying to Sit, Rolling Rolling: Supervision Sidelying to sit: Supervision       General bed mobility comments: VC's for optimal log roll technique. Pt moving quickly and not maintaining precautions at times.    Transfers Overall transfer level: Needs assistance Equipment used: Rollator (4 wheels) Transfers: Sit to/from Stand Sit to Stand: Min guard           General transfer comment: VC's for hand placement on seated surface for safety. Poor posture with power up to full stand.    Ambulation/Gait Ambulation/Gait assistance: Min guard Gait Distance (Feet): 300 Feet Assistive device: Rollator (4 wheels) Gait Pattern/deviations: Step-through pattern, Decreased stride length, Trunk flexed Gait velocity: Decreased Gait velocity interpretation: <1.31 ft/sec, indicative of household ambulator   General Gait Details: VC's for improved posture, closer walker proximity, and forward gaze. No assist required but hands on guarding provided throughout due to instability and unsteadiness.  Stairs            Wheelchair Mobility    Modified Rankin (Stroke Patients Only)       Balance Overall balance assessment: Needs assistance Sitting-balance support: Feet supported Sitting balance-Leahy Scale: Good     Standing balance support: No upper extremity supported, During functional activity Standing balance-Leahy Scale: Poor Standing balance comment: Reliant either on rollator or furniture in room for balance and support.  Pertinent Vitals/Pain Pain Assessment Pain Assessment: Faces Faces Pain Scale: Hurts even  more Pain Location: incisional Pain Descriptors / Indicators: Operative site guarding, Sore Pain Intervention(s): Limited activity within patient's tolerance, Monitored during session, Repositioned, Ice applied    Home Living Family/patient expects to be discharged to:: Private residence Living Arrangements: Other relatives Available Help at Discharge: Family;Available 24 hours/day Type of Home: House Home Access: Stairs to enter   [employer] of Steps: 2   Home Layout: One level Home Equipment: Rollator (4 wheels);BSC/3in1;Adaptive equipment;Grab bars - tub/shower;Grab bars - toilet;Shower seat      Prior Function Prior Level of Function : Independent/Modified Independent             Mobility Comments: uses rollator outside, and generally no AD in the home as he furniture walks ADLs Comments: completes his own ADL, and participates with iADL     Hand Dominance   Dominant Hand: Right    Extremity/Trunk Assessment   Upper Extremity Assessment Upper Extremity Assessment: RUE deficits/detail;LUE deficits/detail;Generalized weakness RUE Deficits / Details: baseline ataxia, numbness and weakness. Pt reports new "jerking" movements since surgery. RUE Sensation: decreased light touch RUE Coordination: decreased fine motor;decreased gross motor LUE Deficits / Details: baseline ataxia, numbness and weakness. Pt reports new "jerking" movements since surgery. LUE Sensation: decreased light touch LUE Coordination: decreased fine motor;decreased gross motor    Lower Extremity Assessment Lower Extremity Assessment: Generalized weakness (Peripheral neuropathy at baseline, incoordination bilaterally.)    Cervical / Trunk Assessment Cervical / Trunk Assessment: Kyphotic;Neck Surgery  Communication   Communication: No difficulties  Cognition Arousal/Alertness: Awake/alert Behavior During Therapy: Impulsive Overall Cognitive Status: Within Functional Limits for tasks  assessed                                          General Comments      Exercises     Assessment/Plan    PT Assessment Patient needs continued PT services  PT Problem List Decreased strength;Decreased range of motion;Decreased activity tolerance;Decreased balance;Decreased mobility;Decreased knowledge of use of DME;Decreased safety awareness;Decreased knowledge of precautions;Pain       PT Treatment Interventions DME instruction;Gait training;Functional mobility training;Stair training;Therapeutic activities;Therapeutic exercise;Balance training;Patient/family education    PT Goals (Current goals can be found in the Care Plan section)  Acute Rehab PT Goals Patient Stated Goal: Home at d/c PT Goal Formulation: With patient Time For Goal Achievement: 11/13/22 Potential to Achieve Goals: Good    Frequency Min 5X/week     Co-evaluation               AM-PAC PT "6 Clicks" Mobility  Outcome Measure Help needed turning from your back to your side while in a flat bed without using bedrails?: A Little Help needed moving from lying on your back to sitting on the side of a flat bed without using bedrails?: A Little Help needed moving to and from a bed to a chair (including a wheelchair)?: A Little Help needed standing up from a chair using your arms (e.g., wheelchair or bedside chair)?: A Little Help needed to walk in hospital room?: A Little Help needed climbing 3-5 steps with a railing? : A Little 6 Click Score: 18    End of Session Equipment Utilized During Treatment: Gait belt Activity Tolerance: Patient tolerated treatment well Patient left: in chair;with call bell/phone within reach Nurse Communication: Mobility status PT Visit Diagnosis: Unsteadiness  on feet (R26.81);Pain Pain - part of body:  (neck)    Time: [id number] PT Time Calculation (min) (ACUTE ONLY): 20 min   Charges:   PT Evaluation $PT Eval Low Complexity: [place], PT, DPT Acute Rehabilitation Services Secure Chat Preferred Office: [phone]   Thelma Comp 11/06/2022, 10:15 AM

## 2022-11-06 NOTE — Progress Notes (Signed)
Patient refuse HHPT.

## 2022-11-06 NOTE — Evaluation (Signed)
Occupational Therapy Evaluation Patient Details Name: Curtis Hartman MRN: [id number] DOB: 13-Nov-1960 Today's Date: 11/06/2022   History of Present Illness 62 yo M s/p ACDF.  PMH includes: prior neck surgery, arthtitis, HTN, GERD.   Clinical Impression   Patient admitted for the procedure above.  PTA he rents a room at his sister's home, assists with iADL, and completes his own ADL.  Baseline ataxia, weakness, and coordination, along with post surgical discomfort are the deficits.  Currently he is needing generalized supervision for ADL and in room mobility.  OT will follow in the acute setting to maximize functional status, and assist with the transition home.        Recommendations for follow up therapy are one component of a multi-disciplinary discharge planning process, led by the attending physician.  Recommendations may be updated based on patient status, additional functional criteria and insurance authorization.   Follow Up Recommendations  No OT follow up     Assistance Recommended at Discharge Intermittent Supervision/Assistance  Patient can return home with the following Assist for transportation    Functional Status Assessment  Patient has had a recent decline in their functional status and demonstrates the ability to make significant improvements in function in a reasonable and predictable amount of time.  Equipment Recommendations  None recommended by OT    Recommendations for Other Services       Precautions / Restrictions Precautions Precautions: Cervical Precaution Booklet Issued: Yes (comment) Precaution Comments: reviewed with teach back Required Braces or Orthoses: Cervical Brace Cervical Brace: Hard collar;For comfort Restrictions Weight Bearing Restrictions: No      Mobility Bed Mobility Overal bed mobility: Needs Assistance Bed Mobility: Sidelying to Sit, Sit to Sidelying   Sidelying to sit: Supervision     Sit to sidelying: Supervision       Transfers Overall transfer level: Needs assistance Equipment used: Rolling walker (2 wheels) Transfers: Sit to/from Stand, Bed to chair/wheelchair/BSC Sit to Stand: Supervision     Step pivot transfers: Supervision            Balance Overall balance assessment: Needs assistance Sitting-balance support: Feet supported Sitting balance-Leahy Scale: Good     Standing balance support: Reliant on assistive device for balance Standing balance-Leahy Scale: Poor                             ADL either performed or assessed with clinical judgement   ADL       Grooming: Wash/dry hands;Supervision/safety;Standing               Lower Body Dressing: Supervision/safety;Sit to/from stand   Toilet Transfer: Supervision/safety;Rolling walker (2 wheels);Regular Toilet;Ambulation                   Vision Baseline Vision/History: 1 Wears glasses Patient Visual Report: No change from baseline       Perception     Praxis      Pertinent Vitals/Pain Pain Assessment Pain Assessment: Faces Faces Pain Scale: Hurts little more Pain Location: incisional Pain Descriptors / Indicators: Tender Pain Intervention(s): Monitored during session     Hand Dominance Right   Extremity/Trunk Assessment Upper Extremity Assessment Upper Extremity Assessment: RUE deficits/detail;LUE deficits/detail;Generalized weakness RUE Deficits / Details: baseline ataxia, numbness and weakness RUE Sensation: decreased light touch RUE Coordination: decreased fine motor;decreased gross motor LUE Deficits / Details: baseline ataxia, numbness and weakness LUE Sensation: decreased light touch LUE Coordination: decreased fine motor;decreased gross motor  Lower Extremity Assessment Lower Extremity Assessment: Defer to PT evaluation   Cervical / Trunk Assessment Cervical / Trunk Assessment: Kyphotic;Neck Surgery   Communication Communication Communication: No difficulties    Cognition Arousal/Alertness: Awake/alert Behavior During Therapy: Impulsive Overall Cognitive Status: Within Functional Limits for tasks assessed                                       General Comments   VSS on RA    Exercises     Shoulder Instructions      Home Living Family/patient expects to be discharged to:: Private residence Living Arrangements: Other relatives Available Help at Discharge: Family;Available 24 hours/day Type of Home: House Home Access: Stairs to enter [employer] of Steps: 2   Home Layout: One level     Bathroom Shower/Tub: Tub/shower unit;Walk-in shower   Bathroom Toilet: Standard Bathroom Accessibility: Yes How Accessible: Accessible via walker Home Equipment: Rollator (4 wheels);BSC/3in1;Adaptive equipment;Grab bars - tub/shower;Grab bars - toilet;Shower Theme park manager: Reacher        Prior Functioning/Environment Prior Level of Function : Independent/Modified Independent             Mobility Comments: uses 4WRW outside, and generally no AD in the home ADLs Comments: completes his own ADL, and participates with iADL        OT Problem List: Decreased strength;Impaired balance (sitting and/or standing);Decreased coordination      OT Treatment/Interventions: Self-care/ADL training;Therapeutic activities;Patient/family education;Balance training;DME and/or AE instruction    OT Goals(Current goals can be found in the care plan section) Acute Rehab OT Goals Patient Stated Goal: Return home OT Goal Formulation: With patient Time For Goal Achievement: 11/20/22 Potential to Achieve Goals: Good  OT Frequency: Min 2X/week    Co-evaluation              AM-PAC OT "6 Clicks" Daily Activity     Outcome Measure Help from another person eating meals?: None Help from another person taking care of personal grooming?: A Little Help from another person toileting, which includes using toliet, bedpan, or  urinal?: A Little Help from another person bathing (including washing, rinsing, drying)?: A Little Help from another person to put on and taking off regular upper body clothing?: None Help from another person to put on and taking off regular lower body clothing?: A Little 6 Click Score: 20   End of Session Equipment Utilized During Treatment: Rolling walker (2 wheels) Nurse Communication: Mobility status  Activity Tolerance: Patient tolerated treatment well Patient left: in bed;with call bell/phone within reach  OT Visit Diagnosis: Unsteadiness on feet (R26.81);Muscle weakness (generalized) (M62.81);Ataxia, unspecified (R27.0)                Time: 4627-0350 OT Time Calculation (min): 21 min Charges:  OT General Charges $OT Visit: 1 Visit OT Evaluation $OT Eval Moderate Complexity: 1 Mod  11/06/2022  RP, OTR/L  Acute Rehabilitation Services  Office:  [phone]   Metta Clines 11/06/2022, 8:47 AM

## 2022-11-06 NOTE — Discharge Summary (Signed)
  Physician Discharge Summary  Patient ID: Curtis Hartman MRN: [id number] DOB/AGE: Jul 29, 1961 62 y.o.  Admit date: 11/05/2022 Discharge date: 11/06/2022  Admission Diagnoses:  S/p cervical fusion  Discharge Diagnoses:  Same Principal Problem:   S/P cervical spinal fusion   Discharged Condition: Stable  Hospital Course:  Curtis Hartman is a 62 y.o. 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  He underwent elective C4-5 ACDF and was observed in the hospital following his surgery.  He was tolerating a regular diet, pain was controlled with PO pain medications, and he had no neurologic changes.  He was deemed ready for discharge home on 1/4  Treatments: Surgery - C4-5 ACDF  Discharge Exam: Blood pressure (!) 127/95, pulse 80, temperature 98.5 F (36.9 C), temperature source Oral, resp. rate 18, height 5' 9.5" (1.765 m), weight 74.8 kg, SpO2 100 %. Awake, alert, oriented Speech fluent, appropriate CN grossly intact 5/5 BUE/BLE Wound c/d/i  Disposition: Discharge disposition: 01-Home or Self Care       Discharge Instructions     Incentive spirometry RT   Complete by: As directed       Allergies as of 11/06/2022   No Known Allergies      Medication List     TAKE these medications    fluticasone 50 MCG/ACT nasal spray Commonly known as: FLONASE Place 2 sprays into both nostrils daily.   gabapentin 300 MG capsule Commonly known as: NEURONTIN Take 600 mg by mouth 3 (three) times daily.   insulin aspart 100 UNIT/ML injection Commonly known as: novoLOG Inject 2-10 Units into the skin 3 (three) times daily as needed (blood sugar over 150).   Lantus 100 UNIT/ML injection Generic drug: insulin glargine Inject 5 Units into the skin at bedtime as needed (blood sugar over 120).   losartan 100 MG tablet Commonly known as: COZAAR Take 100 mg by mouth daily.   morphine 15 MG tablet Commonly known as:  MSIR Take 15 mg by mouth every 6 (six) hours as needed for severe pain.   pantoprazole 40 MG tablet Commonly known as: PROTONIX Take 1 tablet (40 mg total) by mouth daily. What changed:  how much to take when to take this reasons to take this        Follow-up Information     Vallarie Mare, MD Follow up in 2 week(s).   Specialty: Neurosurgery Contact information: [address] [place] [address] [phone]                 Signed: Vallarie Mare 11/06/2022, 10:55 AM

## 2022-11-06 NOTE — Plan of Care (Signed)
  Problem: Education: Goal: Ability to verbalize activity precautions or restrictions will improve Outcome: Completed/Met Goal: Knowledge of the prescribed therapeutic regimen will improve Outcome: Completed/Met Goal: Understanding of discharge needs will improve Outcome: Completed/Met   Problem: Activity: Goal: Ability to avoid complications of mobility impairment will improve Outcome: Completed/Met Goal: Ability to tolerate increased activity will improve Outcome: Completed/Met Goal: Will remain free from falls Outcome: Completed/Met   Problem: Bowel/Gastric: Goal: Gastrointestinal status for postoperative course will improve Outcome: Completed/Met   Problem: Clinical Measurements: Goal: Ability to maintain clinical measurements within normal limits will improve Outcome: Completed/Met Goal: Postoperative complications will be avoided or minimized Outcome: Completed/Met Goal: Diagnostic test results will improve Outcome: Completed/Met   Problem: Pain Management: Goal: Pain level will decrease Outcome: Completed/Met   Problem: Skin Integrity: Goal: Will show signs of wound healing Outcome: Completed/Met   Problem: Health Behavior/Discharge Planning: Goal: Identification of resources available to assist in meeting health care needs will improve Outcome: Completed/Met   Problem: Bladder/Genitourinary: Goal: Urinary functional status for postoperative course will improve Outcome: Completed/Met  Patient alert and oriented, void, ambulate. Surgical site clean and dry. D/c instructions explain and given to the patient all questions answered. Will d/c patient home per order.

## 2022-11-07 ENCOUNTER — Encounter (HOSPITAL_COMMUNITY): Payer: Self-pay | Admitting: Neurosurgery

## 2022-11-28 LAB — CBC WITH DIFFERENTIAL/PLATELET
Absolute Monocytes: 600 cells/uL (ref 200–950)
Basophils Absolute: 61 cells/uL (ref 0–200)
Basophils Relative: 0.7 %
Eosinophils Absolute: 218 cells/uL (ref 15–500)
Eosinophils Relative: 2.5 %
HCT: 39.1 % (ref 38.5–50.0)
Hemoglobin: 12.9 g/dL — ABNORMAL LOW (ref 13.2–17.1)
Lymphs Abs: 1723 cells/uL (ref 850–3900)
MCH: 29.7 pg (ref 27.0–33.0)
MCHC: 33 g/dL (ref 32.0–36.0)
MCV: 89.9 fL (ref 80.0–100.0)
MPV: 10.2 fL (ref 7.5–12.5)
Monocytes Relative: 6.9 %
Neutro Abs: 6099 cells/uL (ref 1500–7800)
Neutrophils Relative %: 70.1 %
Platelets: 349 10*3/uL (ref 140–400)
RBC: 4.35 10*6/uL (ref 4.20–5.80)
RDW: 12.4 % (ref 11.0–15.0)
Total Lymphocyte: 19.8 %
WBC: 8.7 10*3/uL (ref 3.8–10.8)

## 2022-11-28 LAB — COMPREHENSIVE METABOLIC PANEL
AG Ratio: 1.4 (calc) (ref 1.0–2.5)
ALT: 6 U/L — ABNORMAL LOW (ref 9–46)
AST: 10 U/L (ref 10–35)
Albumin: 3.7 g/dL (ref 3.6–5.1)
Alkaline phosphatase (APISO): 66 U/L (ref 35–144)
BUN/Creatinine Ratio: 26 (calc) — ABNORMAL HIGH (ref 6–22)
BUN: 72 mg/dL — ABNORMAL HIGH (ref 7–25)
CO2: 29 mmol/L (ref 20–32)
Calcium: 9.4 mg/dL (ref 8.6–10.3)
Chloride: 105 mmol/L (ref 98–110)
Creat: 2.78 mg/dL — ABNORMAL HIGH (ref 0.70–1.35)
Globulin: 2.7 g/dL (calc) (ref 1.9–3.7)
Glucose, Bld: 110 mg/dL (ref 65–139)
Potassium: 4.5 mmol/L (ref 3.5–5.3)
Sodium: 142 mmol/L (ref 135–146)
Total Bilirubin: 0.4 mg/dL (ref 0.2–1.2)
Total Protein: 6.4 g/dL (ref 6.1–8.1)

## 2022-12-26 ENCOUNTER — Other Ambulatory Visit (HOSPITAL_COMMUNITY): Payer: Self-pay | Admitting: Nephrology

## 2022-12-26 ENCOUNTER — Ambulatory Visit (HOSPITAL_COMMUNITY)
Admission: RE | Admit: 2022-12-26 | Discharge: 2022-12-26 | Disposition: A | Discharge status: Home / Self Care | Payer: Medicaid Other | Source: Ambulatory Visit | Attending: Nephrology | Admitting: Nephrology

## 2022-12-26 DIAGNOSIS — M25552 Pain in left hip: Secondary | ICD-10-CM | POA: Diagnosis present

## 2023-02-24 ENCOUNTER — Telehealth: Payer: Self-pay

## 2023-02-24 NOTE — Telephone Encounter (Signed)
Pt LMOVM regarding someone calling him to see if he still has Hep C. Please call

## 2023-02-24 NOTE — Telephone Encounter (Signed)
LMOM for pt to call office  

## 2023-02-25 NOTE — Telephone Encounter (Signed)
Noted. Informed pt.  

## 2023-04-08 ENCOUNTER — Telehealth: Payer: Self-pay

## 2023-04-08 NOTE — Telephone Encounter (Signed)
Spoke to pt, informed he did not need any blood work before OV on 6/18. Reminded him of OV also.

## 2023-04-08 NOTE — Telephone Encounter (Signed)
Pt phoned regarding his blood work. Wants a call back

## 2023-04-21 ENCOUNTER — Encounter: Payer: Self-pay | Admitting: Gastroenterology

## 2023-04-21 ENCOUNTER — Ambulatory Visit (INDEPENDENT_AMBULATORY_CARE_PROVIDER_SITE_OTHER): Payer: Medicaid Other | Admitting: Gastroenterology

## 2023-04-21 VITALS — BP 108/79 | HR 74 | Temp 97.8°F | Ht 69.0 in | Wt 153.4 lb

## 2023-04-21 DIAGNOSIS — K219 Gastro-esophageal reflux disease without esophagitis: Secondary | ICD-10-CM | POA: Diagnosis not present

## 2023-04-21 DIAGNOSIS — B182 Chronic viral hepatitis C: Secondary | ICD-10-CM

## 2023-04-21 DIAGNOSIS — K580 Irritable bowel syndrome with diarrhea: Secondary | ICD-10-CM

## 2023-04-21 MED ORDER — BENEFIBER PO POWD: ORAL | 0 refills | Status: DC

## 2023-04-21 NOTE — Progress Notes (Signed)
GI Office Note    Referring Provider: Wilmon Pali, FNP Primary Care Physician:  Wilmon Pali, FNP Primary Gastroenterologist: Hennie Duos. Marletta Lor, DO   Date:  04/21/2023  ID:  Minette Brine, DOB [birth year], MRN [id number]   Chief Complaint   Chief Complaint  Patient presents with   Follow-up    Patient here today for a follow up on Hep C. He says he was treated with Mayvret a few months ago. He was told it was eradicated.    Diarrhea    Patient here today says he is still having issues with diarrhea. He is not taking any medication for this.    History of Present Illness  CAYDAN DOUCET is a 62 y.o. male with a history of hepatitis C, GERD, IBS-diarrhea predominant, CKD, diabetes, HTN presenting today for follow-up.   EGD 03/03/2022: -Gastritis -Duodenitis -Biopsies negative for H. Pylori   Colonoscopy 03/03/2022: -Random biopsies negative for microscopic colitis -2 polyps removed from the transverse and sigmoid colon -Polyp biopsies consistent with tubular adenomas -Advised repeat in 5 years.   Hepatitis C history: Genotype 2, viral load 2.4 million HIV and hepatitis B negative. Right upper quadrant ultrasound with elastography unremarkable, median K PA 4.5. Completed 8 weeks of Mavyret Viral load unremarkable 06/18/2022.   Office visit 06/25/2022.  Patient was tolerating Mavyret well, was 5 weeks into treatment.  Noted history of GERD, on omeprazole 20 mg daily as needed.  Has occasional flares which induces vomiting.  Noted history of diarrhea predominant IBS with slow improvement.  Occasionally has normal bowel movements mixed with occasional pullout diarrhea episodes.  Previously on Bentyl.  Patient was advised to start taking pantoprazole every day regardless of any symptoms to help prevent flares.  Reportedly Bentyl did not help with diarrhea.  Advised to take over-the-counter Imodium as needed.  Advised repeat followed in 3 months to ensure SVR.  Advised to  follow-up in 4 months.   Labs 07/29/2022: Hemoglobin 12.4, MCV 83.6, platelets 354, creatinine 1.88, BUN 38, glucose 198, GFR 40.  Last office visit 10/21/22.  Had recently gained a few pounds.  Denied any issues with Mavyret.  Denied any jaundice or pruritus.  Was having hard stools recently.  Occasionally having some softer stools.  Not taking any Imodium or over-the-counter stool softener.  Happy with current bowel habits.  No alcohol use in 2 years.  Blood sugars under good control.  Not taking pantoprazole daily, has occasional symptoms with vomiting and usually will take a half a pill when he feels like he just needs it.  Trying to cut back on smoking.  Sometimes cutting back on coffee helps with reflux.  Advised to check HCVRNA.  Offer lower dose of pantoprazole daily, patient requested current regimen.  Advised he may use over-the-counter stool softener as needed for constipation or Imodium for diarrhea.  HCVRNA in November 04, 2022 less than 15 IU/mL.  Labs January 2024: Hemoglobin 12.9, platelets 349, creatinine 2.78, BUN 72  Labs March 2024: Hemoglobin 12.2, platelets 295, creatinine 2.76, phosphorus 4.6, parathyroid 147   Today: IBS - Stool is like oatmeal. Had an episode last week. A month and a half ago he was having stools that were good and then out of the blue he will have some accidents. Does have a lot of urgency at times. When he has normal stools he can make it okay.  Unable to identify any specific triggers. Lactose years ago would cause his significant loose stools but  last week he ate some ice cream and it was fine. Flares last for a few days. Does not usually take any imodium.   Dr. Wolfgang Phoenix is working on changing some of his medications around.   GERD - No issues with reflux in a while. Last flare was about 3 months ago that lasted about 4 days with vomiting. Has not taken any protonix in a while. Notices that if he takes pepto that seems to work faster. Sometimes has rotten  taste in his mouth and he burps then he will take pepto and it will go away.   Hep C - Doing well. No jaundice or pruritus.   Blood sugars have been good. Has a good appetite. He feels like he is getting good protein intake.   Goes next month for back and neck xrays after his previous surgeries. Seeing pain management.  Current Outpatient Medications  Medication Sig Dispense Refill   fluticasone (FLONASE) 50 MCG/ACT nasal spray Place 2 sprays into both nostrils daily.     gabapentin (NEURONTIN) 300 MG capsule Take 600 mg by mouth 2 (two) times daily.     insulin aspart (NOVOLOG) 100 UNIT/ML injection Inject 2-10 Units into the skin 3 (three) times daily as needed (blood sugar over 150).     LANTUS 100 UNIT/ML injection Inject 5 Units into the skin at bedtime as needed (blood sugar over 120).     losartan (COZAAR) 100 MG tablet Take 100 mg by mouth daily.     morphine (MSIR) 15 MG tablet Take 15 mg by mouth every 6 (six) hours as needed for severe pain.     pantoprazole (PROTONIX) 40 MG tablet Take 1 tablet (40 mg total) by mouth daily. (Patient taking differently: Take 20 mg by mouth daily as needed (acid reflux).) 30 tablet 5   No current facility-administered medications for this visit.    Past Medical History:  Diagnosis Date   Arthritis    CKD (chronic kidney disease)    Diabetes mellitus without complication (HCC)    type 2   GERD (gastroesophageal reflux disease)    Hepatitis    Hypertension    Neuropathy     Past Surgical History:  Procedure Laterality Date   ANTERIOR CERVICAL DECOMP/DISCECTOMY FUSION N/A 11/05/2022   Procedure: Anterior Cervical Decompression/discectomy Fusion Cervical Four-Cervical Five;  Surgeon: Bedelia Person, MD;  Location: [facility] OR;  Service: Neurosurgery;  Laterality: N/A;  3C   BIOPSY  03/03/2022   Procedure: BIOPSY;  Surgeon: Lanelle Bal, DO;  Location: [facility];  Service: Endoscopy;;   COLONOSCOPY WITH PROPOFOL N/A 03/03/2022    Procedure: COLONOSCOPY WITH PROPOFOL;  Surgeon: Lanelle Bal, DO;  Location: [facility];  Service: Endoscopy;  Laterality: N/A;  8:00am   ESOPHAGOGASTRODUODENOSCOPY (EGD) WITH PROPOFOL N/A 03/03/2022   Procedure: ESOPHAGOGASTRODUODENOSCOPY (EGD) WITH PROPOFOL;  Surgeon: Lanelle Bal, DO;  Location: [facility];  Service: Endoscopy;  Laterality: N/A;   foot right partial toe amputation Right    FOOT SURGERY     hernia surgery x4     POLYPECTOMY  03/03/2022   Procedure: POLYPECTOMY;  Surgeon: Lanelle Bal, DO;  Location: [facility];  Service: Endoscopy;;    Family History  Problem Relation Age of Onset   Heart disease Mother    Seizures Father    Stroke Maternal Grandfather    Liver disease Paternal Grandmother     Allergies as of 04/21/2023   (No Known Allergies)  Social History   Socioeconomic History   Marital status: Single    Spouse name: Not on file   Number of children: Not on file   Years of education: Not on file   Highest education level: Not on file  Occupational History   Not on file  Tobacco Use   Smoking status: Every Day    Packs/day: .25    Types: Cigarettes    Passive exposure: Never   Smokeless tobacco: Never  Vaping Use   Vaping Use: Never used  Substance and Sexual Activity   Alcohol use: Not Currently   Drug use: Not Currently   Sexual activity: Not on file  Other Topics Concern   Not on file  Social History Narrative   Not on file   Social Determinants of Health   Financial Resource Strain: Not on file  Food Insecurity: Not on file  Transportation Needs: Not on file  Physical Activity: Not on file  Stress: Not on file  Social Connections: Not on file     Review of Systems   Gen: Denies fever, chills, anorexia. Denies fatigue, weakness, weight loss.  CV: Denies chest pain, palpitations, syncope, peripheral edema, and claudication. Resp: Denies dyspnea at rest, cough, wheezing, coughing up blood, and pleurisy. GI:  See HPI Derm: Denies rash, itching, dry skin Psych: Denies depression, anxiety, memory loss, confusion. No homicidal or suicidal ideation.  Heme: Denies bruising, bleeding, and enlarged lymph nodes.   Physical Exam   BP 108/79 (BP Location: Left Arm, Patient Position: Sitting, Cuff Size: Normal)   Pulse 74   Temp 97.8 F (36.6 C) (Temporal)   Ht 5\' 9"  (1.753 m)   Wt 153 lb 6.4 oz (69.6 kg)   BMI 22.65 kg/m   General:   Alert and oriented. No distress noted. Pleasant and cooperative.  Head:  Normocephalic and atraumatic. Eyes:  Conjuctiva clear without scleral icterus. Mouth:  Oral mucosa pink and moist. Poor dentition. No lesions. Lungs:  Clear to auscultation bilaterally. No wheezes, rales, or rhonchi. No distress.  Heart:  S1, S2 present without murmurs appreciated.  Abdomen:  +BS, soft, non-tender and non-distended. No rebound or guarding. No HSM or masses noted.  Rectal: deferred Msk:  Symmetrical without gross deformities. Normal posture. Extremities:  Without edema. Neurologic:  Alert and  oriented x4 Psych:  Alert and cooperative. Normal mood and affect.   Assessment  Curtis Hartman is a 62 y.o. male with a history of hepatitis C s/p documented eradication, GERD, IBS-diarrhea predominant, CKD, diabetes, HTN presenting today for follow-up.    Chronic hepatitis C: Genotype 2.  Treated with 8-week course of Mavyret with documented SVR in January 2024.  Denies any jaundice, pruritus, or mental status changes.  Has had some mild weight loss but overall reports good appetite.  IBS with diarrhea: Has had a few intermittent flares.  At times has some urgency with looser stools and at times has some more formed stools.  Denies any need to strain.  Denies any melena or BRBPR.  At times lactose, bothersome to him, other times there is an issue.  Advised that if diarrhea flares last more than 24-48 hours he may use half a tablet of Imodium as needed.  Also advised that if he is  having any hard stools at all he may use a stool softener as needed.  GERD: EGD in May 2023 with gastritis and duodenitis with biopsies negative for H. pylori.  Previously was on PPI once daily or as  needed.  Fairly well-controlled right now off PPI.  Has not had any significant flares in the last 3 months.  Advised he may use famotidine over-the-counter as needed for reflux flares.  PLAN   May use stool softener as needed and imodium as needed May use pepto as needed.  Start benefiber 2-3 teaspoons daily in 8 oz water.  May use famotidine as needed for reflux breakthrough symptoms. Follow up 6 months     Brooke Bonito, MSN, FNP-BC, AGACNP-BC [facility]

## 2023-04-21 NOTE — Patient Instructions (Addendum)
Start benefiber 2-3 teaspoons daily in 8 oz water.  When you are having hard stools you may take a stool softener as needed.  If you are having flares of diarrhea that last more than 24-48 hours you may take a half a tablet of Imodium as needed.  May continue to use Pepto-Bismol as needed as long as this is not on a frequent basis.  For your reflux I would recommend that you use famotidine if you need Pepto frequently.  You can discuss this further with Dr. Wolfgang Phoenix if you wish.  Follow-up again in 6 months.  It was a pleasure to see you today. I want to create trusting relationships with patients. If you receive a survey regarding your visit,  I greatly appreciate you taking time to fill this out on paper or through your MyChart. I value your feedback.  Brooke Bonito, MSN, FNP-BC, AGACNP-BC [facility]

## 2023-04-22 ENCOUNTER — Ambulatory Visit: Payer: Medicaid Other | Admitting: Gastroenterology

## 2023-05-15 ENCOUNTER — Telehealth: Payer: Self-pay | Admitting: Gastroenterology

## 2023-05-15 NOTE — Telephone Encounter (Signed)
diarrhea

## 2023-05-15 NOTE — Telephone Encounter (Signed)
I received a referral from [facility] on patient. Brooke Bonito, NP just seen him on June 18th. Please advise if we need to bring him back in or can we make him some recommendations. [phone]

## 2023-05-20 ENCOUNTER — Institutional Professional Consult (permissible substitution): Payer: Medicaid Other | Admitting: Pulmonary Disease

## 2023-06-09 NOTE — Progress Notes (Addendum)
Minette Brine, male    DOB: Dec 31, 1960    MRN: [id number]   Brief patient profile:  62  yowm  active smoker [place] living [place] x 2021  referred to pulmonary clinic in [place]  06/10/2023 by Coral Ceo  for cough   Disabled due to back and breathing problems since his late 63s    History of Present Illness  06/10/2023  Pulmonary/ 1st office eval/ Sherene Sires / Sales executive Complaint  Patient presents with   Establish Care   Shortness of Breath  Dyspnea:  maybe 100 ft  due legs and feet hurt and balance issues, not really sob Cough: worse in am's x 3-4 h x clear mucus  Sleep: bed is flat /on back / one pillow s noct resp cc  SABA use: never tried  02: none  Lung cancer screen: pending  Rec   No obvious day to day or daytime pattern/variability or assoc   purulent sputum or mucus plugs or hemoptysis or cp or chest tightness, subjective wheeze or overt sinus or hb symptoms.    . Also denies any obvious fluctuation of symptoms with weather or environmental changes or other aggravating or alleviating factors except as outlined above   No unusual exposure hx or h/o childhood pna/ asthma or knowledge of premature birth.  Current Allergies, Complete Past Medical History, Past Surgical History, Family History, and Social History were reviewed in [employer] record.  ROS  The following are not active complaints unless bolded Hoarseness, sore throat, dysphagia, dental problems, itching, sneezing,  nasal congestion or discharge of excess mucus or purulent secretions, ear ache,   fever, chills, sweats, unintended wt loss or wt gain, classically pleuritic or exertional cp,  orthopnea pnd or arm/hand swelling  or leg swelling, presyncope, palpitations, abdominal pain, anorexia, nausea, vomiting, diarrhea  or change in bowel habits or change in bladder habits, change in stools or change in urine, dysuria, hematuria,  rash, arthralgias, visual complaints,  headache, numbness, weakness or ataxia or problems with walking or coordination,  change in mood or  memory.            Outpatient Medications Prior to Visit  Medication Sig Dispense Refill   amLODipine (NORVASC) 10 MG tablet Take 10 mg by mouth daily.     fluticasone (FLONASE) 50 MCG/ACT nasal spray Place 2 sprays into both nostrils daily.     gabapentin (NEURONTIN) 400 MG capsule Take 400 mg by mouth 2 (two) times daily.     insulin aspart (NOVOLOG) 100 UNIT/ML injection Inject 2-10 Units into the skin 3 (three) times daily as needed (blood sugar over 150).     LANTUS 100 UNIT/ML injection Inject 5 Units into the skin at bedtime as needed (blood sugar over 120).     losartan (COZAAR) 100 MG tablet Take 100 mg by mouth daily.     morphine (MSIR) 15 MG tablet Take 15 mg by mouth every 6 (six) hours as needed for severe pain.     Wheat Dextrin (BENEFIBER) POWD Mix 2 - 3 teaspoons in 8 ounces of water daily in the mornings. 475 g 0   gabapentin (NEURONTIN) 300 MG capsule Take 600 mg by mouth 2 (two) times daily.     No facility-administered medications prior to visit.    Past Medical History:  Diagnosis Date   Arthritis    CKD (chronic kidney disease)    Diabetes mellitus without complication (HCC)    type 2  GERD (gastroesophageal reflux disease)    Hepatitis    Hypertension    Neuropathy       Objective:     BP 100/64   Pulse 77   Ht 5\' 9"  (1.753 m)   Wt 147 lb (66.7 kg)   SpO2 98%   BMI 21.71 kg/m   SpO2: 98 % RA   Amb elderly wm > stated age/ using rollator    HEENT : Oropharynx  clear   Nasal turbinates nl    NECK :  without  apparent JVD/ palpable Nodes/TM    LUNGS: no acc muscle use,  Min barrel  contour chest wall with bilateral  slightly decreased bs s audible wheeze and  without cough on insp or exp maneuvers and min  Hyperresonant  to  percussion bilaterally    CV:  RRR  no s3 or murmur or increase in P2, and no edema   ABD:  soft and nontender  with pos end  insp Hoover's  in the supine position.  No bruits or organomegaly appreciated   MS:  ext warm without deformities Or obvious joint restrictions  calf tenderness, cyanosis or clubbing     SKIN: warm and dry without lesions    NEURO:  alert, approp, nl sensorium with  no motor or cerebellar deficits apparent.             CXR PA and Lateral:   06/10/2023 :    I personally reviewed images and impression is as follows:     Bronchial thickening only finding  Assessment   COPD GOLD ? Active smoker - 06/10/2023   Walked on RA  x  2  lap(s) =  approx 300  ft  @ mod/rollator pace, stopped due to leg weakness/pain fatigue  with lowest 02 sats 99% s sob - 06/10/2023  After extensive coaching inhaler device,  effectiveness =    60% from a baseline nearly 0  > given breztri sample for practice purposes then purchase saba hfa pending pfts  Pt is Group A in terms of symptom/risk (mostly CB features)  and laba/lama therefore appropriate rx at this point >>>  approp saba   Re SABA :  I spent extra time with pt today reviewing appropriate use of albuterol for prn use on exertion with the following points: 1) saba is for relief of sob that does not improve by walking a slower pace or resting but rather if the pt does not improve after trying this first. 2) If the pt is convinced, as many are, that saba helps recover from activity faster then it's easy to tell if this is the case by re-challenging : ie stop, take the inhaler, then p 5 minutes try the exact same activity (intensity of workload) that just caused the symptoms and see if they are substantially diminished or not after saba 3) if there is an activity that reproducibly causes the symptoms, try the saba 15 min before the activity on alternate days   If in fact the saba really does help, then fine to continue to use it prn but advised may need to look closer at the maintenance regimen being used to achieve better control of airways disease  with exertion.    Re cough  >>> mucinex 1200 mg bid prn and flutter valve  >>> zpak x one, no need for systemic steroids. >>> f/u 3 m with pfts     Cigarette smoker Counseled re importance of smoking cessation but did not  meet time criteria for separate billing    Low-dose CT lung cancer screening is recommended for patients who are 65-53 years of age with a 20+ pack-year history of smoking and who are currently smoking or quit <=15 years ago. No coughing up blood  No unintentional weight loss of > 15 pounds in the last 6 months - pt is eligible for scanning yearly until 14 y after quits smoking or age 77 > already referred.    Each maintenance medication was reviewed in detail including emphasizing most importantly the difference between maintenance and prns and under what circumstances the prns are to be triggered using an action plan format where appropriate.  Total time for H and P, chart review, counseling, reviewing hfa device(s) , directly observing portions of ambulatory 02 saturation study/ and generating customized AVS unique to this office visit / same day charting = 62 min with pt new to me                  Sandrea Hughs, MD 06/10/2023

## 2023-06-10 ENCOUNTER — Ambulatory Visit: Payer: Medicaid Other | Admitting: Internal Medicine

## 2023-06-10 ENCOUNTER — Encounter: Payer: Self-pay | Admitting: Internal Medicine

## 2023-06-10 ENCOUNTER — Telehealth: Payer: Self-pay | Admitting: Internal Medicine

## 2023-06-10 VITALS — BP 100/64 | HR 77 | Ht 69.0 in | Wt 147.0 lb

## 2023-06-10 DIAGNOSIS — F17210 Nicotine dependence, cigarettes, uncomplicated: Secondary | ICD-10-CM | POA: Diagnosis not present

## 2023-06-10 DIAGNOSIS — J449 Chronic obstructive pulmonary disease, unspecified: Secondary | ICD-10-CM | POA: Diagnosis not present

## 2023-06-10 MED ORDER — BREZTRI AEROSPHERE 160-9-4.8 MCG/ACT IN AERO: 2.0000 | INHALATION_SPRAY | Freq: Two times a day (BID) | RESPIRATORY_TRACT | Status: DC

## 2023-06-10 MED ORDER — AZITHROMYCIN 250 MG PO TABS: ORAL_TABLET | ORAL | 0 refills | Status: DC

## 2023-06-10 MED ORDER — ALBUTEROL SULFATE HFA 108 (90 BASE) MCG/ACT IN AERS: INHALATION_SPRAY | RESPIRATORY_TRACT | 11 refills | Status: DC

## 2023-06-10 NOTE — Telephone Encounter (Signed)
Needs flutter valve ordered

## 2023-06-10 NOTE — Assessment & Plan Note (Addendum)
Active smoker - 06/10/2023   Walked on RA  x  2  lap(s) =  approx 300  ft  @ mod/rollator pace, stopped due to leg weakness/pain fatigue  with lowest 02 sats 99% s sob - 06/10/2023  After extensive coaching inhaler device,  effectiveness =    60% from a baseline nearly 0  > given breztri sample for practice purposes then purchase saba hfa pending pfts  Pt is Group A in terms of symptom/risk (mostly CB features)  and laba/lama therefore appropriate rx at this point >>>  approp saba   Re SABA :  I spent extra time with pt today reviewing appropriate use of albuterol for prn use on exertion with the following points: 1) saba is for relief of sob that does not improve by walking a slower pace or resting but rather if the pt does not improve after trying this first. 2) If the pt is convinced, as many are, that saba helps recover from activity faster then it's easy to tell if this is the case by re-challenging : ie stop, take the inhaler, then p 5 minutes try the exact same activity (intensity of workload) that just caused the symptoms and see if they are substantially diminished or not after saba 3) if there is an activity that reproducibly causes the symptoms, try the saba 15 min before the activity on alternate days   If in fact the saba really does help, then fine to continue to use it prn but advised may need to look closer at the maintenance regimen being used to achieve better control of airways disease with exertion.    Re cough  >>> mucinex 1200 mg bid prn and flutter valve  >>> zpak x one, no need for systemic steroids. >>> f/u 3 m with pfts

## 2023-06-10 NOTE — Assessment & Plan Note (Addendum)
Counseled re importance of smoking cessation but did not meet time criteria for separate billing    Low-dose CT lung cancer screening is recommended for patients who are 70-63 years of age with a 20+ pack-year history of smoking and who are currently smoking or quit <=15 years ago. No coughing up blood  No unintentional weight loss of > 15 pounds in the last 6 months - pt is eligible for scanning yearly until 82 y after quits smoking or age 36 > already referred.    Each maintenance medication was reviewed in detail including emphasizing most importantly the difference between maintenance and prns and under what circumstances the prns are to be triggered using an action plan format where appropriate.  Total time for H and P, chart review, counseling, reviewing hfa device(s) , directly observing portions of ambulatory 02 saturation study/ and generating customized AVS unique to this office visit / same day charting = 62 min with pt new to me

## 2023-06-10 NOTE — Patient Instructions (Addendum)
The key is to stop smoking completely before smoking completely stops you!  Zpak   For cough / congestion > Mucinex 1200 mg every 12 hours and use your flutter valve as much as possible   Only use your albuterol as a rescue medication to be used if you can't catch your breath by resting or doing a relaxed purse lip breathing pattern.  - The less you use it, the better it will work when you need it. - Ok to use up to 2 puffs  every 4 hours if you must but call for immediate appointment if use goes up over your usual need - Don't leave home without it !!  (think of it like the spare tire for your car)   Also  Ok to try albuterol x 2 pffs 15 min before an activity (on alternating days)  that you know would usually make you short of breath and see if it makes any difference and if makes none then don't take albuterol after activity unless you can't catch your breath as this means it's the resting that helps, not the albuterol.   My office will be contacting you by phone for referral to PFTs  - if you don't hear back from my office within one week please call us back or notify us thru MyChart and we'll address it right away.    Breztri Take 2 puffs first thing in am and then another 2 puffs about 12 hours later.  Until used up - save the cannister    Please schedule a follow up visit in 3 months but call sooner if needed  with all medications /inhalers/ solutions in hand so we can verify exactly what you are taking. This includes all medications from all doctors and over the counters

## 2023-06-11 NOTE — Telephone Encounter (Signed)
Order placed

## 2023-08-18 ENCOUNTER — Other Ambulatory Visit: Payer: Self-pay | Admitting: Neurosurgery

## 2023-08-25 NOTE — Pre-Procedure Instructions (Signed)
Surgical Instructions   Your procedure is scheduled on September 01, 2023. Report to [facility] Main Entrance "A" at 8:00 A.M., then check in with the Admitting office. Any questions or running late day of surgery: call [phone]  Questions prior to your surgery date: call [phone], Monday-Friday, 8am-4pm. If you experience any cold or flu symptoms such as cough, fever, chills, shortness of breath, etc. between now and your scheduled surgery, please notify us at the above number.     Remember:  Do not eat or drink after midnight the night before your surgery    Take these medicines the morning of surgery with A SIP OF WATER: gabapentin (NEURONTIN)    May take these medicines IF NEEDED: albuterol (PROAIR HFA) inhaler - please bring with you morning of surgery amLODipine (NORVASC)  Budeson-Glycopyrrol-Formoterol (BREZTRI AEROSPHERE) inhaler - please bring with you morning of surgery fluticasone (FLONASE) nasal spray  morphine (MSIR)    One week prior to surgery, STOP taking any Aspirin (unless otherwise instructed by your surgeon) Aleve, Naproxen, Ibuprofen, Motrin, Advil, Goody's, BC's, all herbal medications, fish oil, and non-prescription vitamins.   WHAT DO I DO ABOUT MY DIABETES MEDICATION?   THE NIGHT BEFORE SURGERY, take 2.5 units of LANTUS insulin.      If your CBG is greater than 220 mg/dL, you may take  of your sliding scale (correction) dose of insulin aspart (NOVOLOG).   HOW TO MANAGE YOUR DIABETES BEFORE AND AFTER SURGERY  Why is it important to control my blood sugar before and after surgery? Improving blood sugar levels before and after surgery helps healing and can limit problems. A way of improving blood sugar control is eating a healthy diet by:  Eating less sugar and carbohydrates  Increasing activity/exercise  Talking with your doctor about reaching your blood sugar goals High blood sugars (greater than 180 mg/dL) can raise your risk of  infections and slow your recovery, so you will need to focus on controlling your diabetes during the weeks before surgery. Make sure that the doctor who takes care of your diabetes knows about your planned surgery including the date and location.  How do I manage my blood sugar before surgery? Check your blood sugar at least 4 times a day, starting 2 days before surgery, to make sure that the level is not too high or low.  Check your blood sugar the morning of your surgery when you wake up and every 2 hours until you get to the Short Stay unit.  If your blood sugar is less than 70 mg/dL, you will need to treat for low blood sugar: Do not take insulin. Treat a low blood sugar (less than 70 mg/dL) with  cup of clear juice (cranberry or apple), 4 glucose tablets, OR glucose gel. Recheck blood sugar in 15 minutes after treatment (to make sure it is greater than 70 mg/dL). If your blood sugar is not greater than 70 mg/dL on recheck, call [phone] for further instructions. Report your blood sugar to the short stay nurse when you get to Short Stay.  If you are admitted to the hospital after surgery: Your blood sugar will be checked by the staff and you will probably be given insulin after surgery (instead of oral diabetes medicines) to make sure you have good blood sugar levels. The goal for blood sugar control after surgery is 80-180 mg/dL.                      Do  NOT Smoke (Tobacco/Vaping) for 24 hours prior to your procedure.  If you use a CPAP at night, you may bring your mask/headgear for your overnight stay.   You will be asked to remove any contacts, glasses, piercing's, hearing aid's, dentures/partials prior to surgery. Please bring cases for these items if needed.    Patients discharged the day of surgery will not be allowed to drive home, and someone needs to stay with them for 24 hours.  SURGICAL WAITING ROOM VISITATION Patients may have no more than 2 support people in the  waiting area - these visitors may rotate.   Pre-op nurse will coordinate an appropriate time for 1 ADULT support person, who may not rotate, to accompany patient in pre-op.  Children under the age of 57 must have an adult with them who is not the patient and must remain in the main waiting area with an adult.  If the patient needs to stay at the hospital during part of their recovery, the visitor guidelines for inpatient rooms apply.  Please refer to the [facility] website for the visitor guidelines for any additional information.   If you received a COVID test during your pre-op visit  it is requested that you wear a mask when out in public, stay away from anyone that may not be feeling well and notify your surgeon if you develop symptoms. If you have been in contact with anyone that has tested positive in the last 10 days please notify you surgeon.      Pre-operative 5 CHG Bathing Instructions   You can play a key role in reducing the risk of infection after surgery. Your skin needs to be as free of germs as possible. You can reduce the number of germs on your skin by washing with CHG (chlorhexidine gluconate) soap before surgery. CHG is an antiseptic soap that kills germs and continues to kill germs even after washing.   DO NOT use if you have an allergy to chlorhexidine/CHG or antibacterial soaps. If your skin becomes reddened or irritated, stop using the CHG and notify one of our RNs at [phone].   Please shower with the CHG soap starting 4 days before surgery using the following schedule:     Please keep in mind the following:  DO NOT shave, including legs and underarms, starting the day of your first shower.   You may shave your face at any point before/day of surgery.  Place clean sheets on your bed the day you start using CHG soap. Use a clean washcloth (not used since being washed) for each shower. DO NOT sleep with pets once you start using the CHG.   CHG Shower  Instructions:  Wash your face and private area with normal soap. If you choose to wash your hair, wash first with your normal shampoo.  After you use shampoo/soap, rinse your hair and body thoroughly to remove shampoo/soap residue.  Turn the water OFF and apply about 3 tablespoons (45 ml) of CHG soap to a CLEAN washcloth.  Apply CHG soap ONLY FROM YOUR NECK DOWN TO YOUR TOES (washing for 3-5 minutes)  DO NOT use CHG soap on face, private areas, open wounds, or sores.  Pay special attention to the area where your surgery is being performed.  If you are having back surgery, having someone wash your back for you may be helpful. Wait 2 minutes after CHG soap is applied, then you may rinse off the CHG soap.  Pat dry with a clean  towel  Put on clean clothes/pajamas   If you choose to wear lotion, please use ONLY the CHG-compatible lotions on the back of this paper.   Additional instructions for the day of surgery: DO NOT APPLY any lotions, deodorants, cologne, or perfumes.   Do not bring valuables to the hospital. [facility] is not responsible for any belongings/valuables. Do not wear nail polish, gel polish, artificial nails, or any other type of covering on natural nails (fingers and toes) Do not wear jewelry or makeup Put on clean/comfortable clothes.  Please brush your teeth.  Ask your nurse before applying any prescription medications to the skin.     CHG Compatible Lotions   Aveeno Moisturizing lotion  Cetaphil Moisturizing Cream  Cetaphil Moisturizing Lotion  Clairol Herbal Essence Moisturizing Lotion, Dry Skin  Clairol Herbal Essence Moisturizing Lotion, Extra Dry Skin  Clairol Herbal Essence Moisturizing Lotion, Normal Skin  Curel Age Defying Therapeutic Moisturizing Lotion with Alpha Hydroxy  Curel Extreme Care Body Lotion  Curel Soothing Hands Moisturizing Hand Lotion  Curel Therapeutic Moisturizing Cream, Fragrance-Free  Curel Therapeutic Moisturizing Lotion,  Fragrance-Free  Curel Therapeutic Moisturizing Lotion, Original Formula  Eucerin Daily Replenishing Lotion  Eucerin Dry Skin Therapy Plus Alpha Hydroxy Crme  Eucerin Dry Skin Therapy Plus Alpha Hydroxy Lotion  Eucerin Original Crme  Eucerin Original Lotion  Eucerin Plus Crme Eucerin Plus Lotion  Eucerin TriLipid Replenishing Lotion  Keri Anti-Bacterial Hand Lotion  Keri Deep Conditioning Original Lotion Dry Skin Formula Softly Scented  Keri Deep Conditioning Original Lotion, Fragrance Free Sensitive Skin Formula  Keri Lotion Fast Absorbing Fragrance Free Sensitive Skin Formula  Keri Lotion Fast Absorbing Softly Scented Dry Skin Formula  Keri Original Lotion  Keri Skin Renewal Lotion Keri Silky Smooth Lotion  Keri Silky Smooth Sensitive Skin Lotion  Nivea Body Creamy Conditioning Oil  Nivea Body Extra Enriched Lotion  Nivea Body Original Lotion  Nivea Body Sheer Moisturizing Lotion Nivea Crme  Nivea Skin Firming Lotion  NutraDerm 30 Skin Lotion  NutraDerm Skin Lotion  NutraDerm Therapeutic Skin Cream  NutraDerm Therapeutic Skin Lotion  ProShield Protective Hand Cream  Provon moisturizing lotion  Please read over the following fact sheets that you were given.

## 2023-08-26 ENCOUNTER — Inpatient Hospital Stay (HOSPITAL_COMMUNITY)
Admission: RE | Admit: 2023-08-26 | Discharge: 2023-08-26 | Disposition: A | Discharge status: Home / Self Care | Payer: Medicaid Other | Source: Ambulatory Visit | Attending: Neurosurgery

## 2023-08-26 ENCOUNTER — Encounter (HOSPITAL_COMMUNITY): Payer: Self-pay

## 2023-08-26 ENCOUNTER — Other Ambulatory Visit: Payer: Self-pay

## 2023-08-26 ENCOUNTER — Other Ambulatory Visit: Payer: Self-pay | Admitting: Neurosurgery

## 2023-08-26 VITALS — BP 156/94 | HR 78 | Temp 98.2°F | Resp 17 | Ht 69.0 in | Wt 157.0 lb

## 2023-08-26 DIAGNOSIS — Z794 Long term (current) use of insulin: Secondary | ICD-10-CM | POA: Diagnosis not present

## 2023-08-26 DIAGNOSIS — K769 Liver disease, unspecified: Secondary | ICD-10-CM | POA: Insufficient documentation

## 2023-08-26 DIAGNOSIS — Z01818 Encounter for other preprocedural examination: Secondary | ICD-10-CM | POA: Insufficient documentation

## 2023-08-26 DIAGNOSIS — E119 Type 2 diabetes mellitus without complications: Secondary | ICD-10-CM | POA: Diagnosis not present

## 2023-08-26 HISTORY — DX: Headache, unspecified: R51.9

## 2023-08-26 HISTORY — DX: Chronic obstructive pulmonary disease, unspecified: J44.9

## 2023-08-26 LAB — COMPREHENSIVE METABOLIC PANEL
ALT: 10 U/L (ref 0–44)
AST: 16 U/L (ref 15–41)
Albumin: 3.4 g/dL — ABNORMAL LOW (ref 3.5–5.0)
Alkaline Phosphatase: 49 U/L (ref 38–126)
Anion gap: 10 (ref 5–15)
BUN: 69 mg/dL — ABNORMAL HIGH (ref 8–23)
CO2: 22 mmol/L (ref 22–32)
Calcium: 9.2 mg/dL (ref 8.9–10.3)
Chloride: 110 mmol/L (ref 98–111)
Creatinine, Ser: 4.1 mg/dL — ABNORMAL HIGH (ref 0.61–1.24)
GFR, Estimated: 16 mL/min — ABNORMAL LOW (ref >60)
Glucose, Bld: 138 mg/dL — ABNORMAL HIGH (ref 70–99)
Potassium: 5 mmol/L (ref 3.5–5.1)
Sodium: 142 mmol/L (ref 135–145)
Total Bilirubin: 0.5 mg/dL (ref 0.3–1.2)
Total Protein: 6.4 g/dL — ABNORMAL LOW (ref 6.5–8.1)

## 2023-08-26 LAB — TYPE AND SCREEN
ABO/RH(D): A POS
Antibody Screen: NEGATIVE

## 2023-08-26 LAB — SURGICAL PCR SCREEN
MRSA, PCR: NEGATIVE
Staphylococcus aureus: NEGATIVE

## 2023-08-26 LAB — CBC
HCT: 32.3 % — ABNORMAL LOW (ref 39.0–52.0)
Hemoglobin: 10.3 g/dL — ABNORMAL LOW (ref 13.0–17.0)
MCH: 28.9 pg (ref 26.0–34.0)
MCHC: 31.9 g/dL (ref 30.0–36.0)
MCV: 90.7 fL (ref 80.0–100.0)
Platelets: 257 10*3/uL (ref 150–400)
RBC: 3.56 MIL/uL — ABNORMAL LOW (ref 4.22–5.81)
RDW: 13.8 % (ref 11.5–15.5)
WBC: 9.2 10*3/uL (ref 4.0–10.5)
nRBC: 0 % (ref 0.0–0.2)

## 2023-08-26 LAB — GLUCOSE, CAPILLARY: Glucose-Capillary: 129 mg/dL — ABNORMAL HIGH (ref 70–99)

## 2023-08-26 LAB — HEMOGLOBIN A1C
Hgb A1c MFr Bld: 5.7 % — ABNORMAL HIGH (ref 4.8–5.6)
Mean Plasma Glucose: 116.89 mg/dL

## 2023-08-26 NOTE — Progress Notes (Addendum)
PCP - Lance Sell, FNP with [facility] in [place], [place] Cardiologist - Denies  PPM/ICD - Denies Device Orders - n/a Rep Notified - n/a  Chest x-ray - n/a EKG - 08/26/2023 Stress Test - Denies ECHO - Denies Cardiac Cath - Denies  Sleep Study - Denies CPAP - n/a  Pt is DM2. He checks his blood sugar 1-2x/day. Normal fasting glucose is in the 90s. CBG at pre-op appointment 125. A1c result pending.  Last dose of GLP1 agonist- n/a GLP1 instructions: n/a  Blood Thinner Instructions: n/a Aspirin Instructions: n/a  NPO after midnight  COVID TEST- n/a   Anesthesia review: Yes. Hx of HTN and DM. Abnormal BMP results. Last office note requested from PCP. Antionette Poles, PA-C reviewed chart prior to Spine surgery January 2024.   Patient denies shortness of breath, fever, cough and chest pain at PAT appointment. Pt denies any respiratory illness/infection in the last two months.   All instructions explained to the patient, with a verbal understanding of the material. Patient agrees to go over the instructions while at home for a better understanding. Patient also instructed to self quarantine after being tested for COVID-19. The opportunity to ask questions was provided.

## 2023-08-27 NOTE — Progress Notes (Signed)
Anesthesia Chart Review:  62 year old male with pertinent history including HTN, GERD, current smoker with associated COPD (maintained on Breztri), IDDM 2, hepatitis C s/p treatment with Mavyret.  Patient follows with nephrologist Dr. 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  Office visit notes requested and reviewed.  Last seen 07/25/2023 at that time his creatinine was 3.78.  It has been continued to trend up over the past several years.  Discussed at that time regarding potential future need for dialysis.  At that time he was referred to kidney smart class as well as for evaluation for peritoneal dialysis.  6-week follow-up recommended.  Preop labs reviewed, creatinine 4.10 consistent with history of CKD 4, moderate anemia hemoglobin 10.3.  I did reach out to Dr. Wolfgang Phoenix regarding pt's declining kidney function and risk for progression to ESRD. Dr. Wolfgang Phoenix did advise he didn't feel dialysis was imminent but there is of course some risk that surgery could hasten his progression and result in him needing postop dialysis. He said he is attempting to reach out to the patient to make sure he understands the risks.  I have also made Dr. Maisie Fus aware of pt's declining kidney function.   EKG 08/27/2023: NSR.  Rate 79.  LAD.  Incomplete right bundle-branch block.  Septal infarct, age undetermined.  No significant change.     [place], [facility] Short Stay Center/Anesthesiology Phone [phone] 08/28/2023 8:08 AM

## 2023-08-28 NOTE — Anesthesia Preprocedure Evaluation (Signed)
Anesthesia Evaluation  Patient identified by MRN, date of birth, ID band Patient awake    Reviewed: Allergy & Precautions, NPO status , Patient's Chart, lab work & pertinent test results  Airway Mallampati: II  TM Distance: >3 FB Neck ROM: Full    Dental no notable dental hx. (+) Poor Dentition, Missing   Pulmonary COPD, Current Smoker and Patient abstained from smoking.   Pulmonary exam normal        Cardiovascular hypertension, Pt. on medications  Rhythm:Regular Rate:Normal     Neuro/Psych  Headaches  negative psych ROS   GI/Hepatic ,GERD  ,,(+) Hepatitis -, C  Endo/Other  diabetes, Poorly Controlled, Type 2, Insulin Dependent    Renal/GU CRFRenal disease  negative genitourinary   Musculoskeletal  (+) Arthritis , Osteoarthritis,    Abdominal Normal abdominal exam  (+)   Peds  Hematology Lab Results      Component                Value               Date                      WBC                      9.2                 08/26/2023                HGB                      10.3 (L)            08/26/2023                HCT                      32.3 (L)            08/26/2023                MCV                      90.7                08/26/2023                PLT                      257                 08/26/2023             Lab Results      Component                Value               Date                      NA                       142                 08/26/2023                K  5.0                 08/26/2023                CO2                      22                  08/26/2023                GLUCOSE                  138 (H)             08/26/2023                BUN                      69 (H)              08/26/2023                CREATININE               4.10 (H)            08/26/2023                CALCIUM                  9.2                 08/26/2023                GFRNONAA                  16 (L)              08/26/2023              Anesthesia Other Findings   Reproductive/Obstetrics                             Anesthesia Physical Anesthesia Plan  ASA: 3  Anesthesia Plan: General   Post-op Pain Management: Celebrex PO (pre-op)*, Tylenol PO (pre-op)* and Ketamine IV*   Induction: Intravenous  PONV Risk Score and Plan: 1 and Ondansetron, Dexamethasone, Midazolam and Treatment may vary due to age or medical condition  Airway Management Planned: Mask and Oral ETT  Additional Equipment: Arterial line  Intra-op Plan:   Post-operative Plan: Possible Post-op intubation/ventilation  Informed Consent: I have reviewed the patients History and Physical, chart, labs and discussed the procedure including the risks, benefits and alternatives for the proposed anesthesia with the patient or authorized representative who has indicated his/her understanding and acceptance.     Dental advisory given  Plan Discussed with: CRNA  Anesthesia Plan Comments: (PAT note by Antionette Poles, PA-C: 62 year old male with pertinent history including HTN, GERD, current smoker with associated COPD (maintained on Breztri), IDDM 2, hepatitis C s/p treatment with Mavyret.  Patient follows with nephrologist Dr. 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  Office visit notes requested and reviewed.  Last seen 07/25/2023 at that time his creatinine was 3.78.  It has been continued to trend up over the past several years.  Discussed at that time regarding potential future need for dialysis.  At that time he was referred to kidney smart class as well as for evaluation for  peritoneal dialysis.  6-week follow-up recommended.  Preop labs reviewed, creatinine 4.10 consistent with history of CKD 4, moderate anemia hemoglobin 10.3.  I did reach out to Dr. Wolfgang Phoenix regarding pt's declining kidney function and risk for progression to ESRD. Dr. Wolfgang Phoenix did advise he  didn't feel dialysis was imminent but there is of course some risk that surgery could hasten his progression and result in him needing postop dialysis. He said he is attempting to reach out to the patient to make sure he understands the risks.  I have also made Dr. Maisie Fus aware of pt's declining kidney function.   EKG 08/27/2023: NSR.  Rate 79.  LAD.  Incomplete right bundle-branch block.  Septal infarct, age undetermined.  No significant change.  )       Anesthesia Quick Evaluation

## 2023-08-31 ENCOUNTER — Other Ambulatory Visit: Payer: Self-pay | Admitting: Neurosurgery

## 2023-09-01 ENCOUNTER — Inpatient Hospital Stay (HOSPITAL_COMMUNITY)
Admission: RE | Admit: 2023-09-01 | Discharge: 2023-09-11 | DRG: 426 | Disposition: A | Discharge status: Rehabilitation Facility | Payer: Medicaid Other | Attending: Neurosurgery | Admitting: Neurosurgery

## 2023-09-01 ENCOUNTER — Other Ambulatory Visit: Payer: Self-pay

## 2023-09-01 ENCOUNTER — Inpatient Hospital Stay (HOSPITAL_COMMUNITY): Payer: Medicaid Other

## 2023-09-01 ENCOUNTER — Inpatient Hospital Stay (HOSPITAL_COMMUNITY): Payer: Medicaid Other | Admitting: Physician Assistant

## 2023-09-01 ENCOUNTER — Inpatient Hospital Stay (HOSPITAL_COMMUNITY): Payer: Self-pay | Admitting: Anesthesiology

## 2023-09-01 ENCOUNTER — Encounter (HOSPITAL_COMMUNITY)
Admission: RE | Disposition: A | Discharge status: Rehabilitation Facility | Payer: Self-pay | Source: Home / Self Care | Attending: Neurosurgery

## 2023-09-01 ENCOUNTER — Encounter (HOSPITAL_COMMUNITY): Payer: Self-pay

## 2023-09-01 DIAGNOSIS — D62 Acute posthemorrhagic anemia: Secondary | ICD-10-CM | POA: Diagnosis not present

## 2023-09-01 DIAGNOSIS — E1122 Type 2 diabetes mellitus with diabetic chronic kidney disease: Secondary | ICD-10-CM | POA: Diagnosis present

## 2023-09-01 DIAGNOSIS — I129 Hypertensive chronic kidney disease with stage 1 through stage 4 chronic kidney disease, or unspecified chronic kidney disease: Secondary | ICD-10-CM | POA: Diagnosis present

## 2023-09-01 DIAGNOSIS — N179 Acute kidney failure, unspecified: Secondary | ICD-10-CM | POA: Diagnosis not present

## 2023-09-01 DIAGNOSIS — M48061 Spinal stenosis, lumbar region without neurogenic claudication: Secondary | ICD-10-CM | POA: Diagnosis present

## 2023-09-01 DIAGNOSIS — N170 Acute kidney failure with tubular necrosis: Secondary | ICD-10-CM | POA: Diagnosis not present

## 2023-09-01 DIAGNOSIS — T508X5A Adverse effect of diagnostic agents, initial encounter: Secondary | ICD-10-CM | POA: Diagnosis not present

## 2023-09-01 DIAGNOSIS — Z8489 Family history of other specified conditions: Secondary | ICD-10-CM

## 2023-09-01 DIAGNOSIS — N1411 Contrast-induced nephropathy: Secondary | ICD-10-CM | POA: Diagnosis not present

## 2023-09-01 DIAGNOSIS — I161 Hypertensive emergency: Secondary | ICD-10-CM | POA: Diagnosis not present

## 2023-09-01 DIAGNOSIS — E119 Type 2 diabetes mellitus without complications: Secondary | ICD-10-CM | POA: Diagnosis not present

## 2023-09-01 DIAGNOSIS — I120 Hypertensive chronic kidney disease with stage 5 chronic kidney disease or end stage renal disease: Secondary | ICD-10-CM | POA: Diagnosis not present

## 2023-09-01 DIAGNOSIS — F54 Psychological and behavioral factors associated with disorders or diseases classified elsewhere: Secondary | ICD-10-CM | POA: Diagnosis not present

## 2023-09-01 DIAGNOSIS — R338 Other retention of urine: Secondary | ICD-10-CM | POA: Diagnosis present

## 2023-09-01 DIAGNOSIS — K589 Irritable bowel syndrome without diarrhea: Secondary | ICD-10-CM | POA: Diagnosis present

## 2023-09-01 DIAGNOSIS — Z79899 Other long term (current) drug therapy: Secondary | ICD-10-CM

## 2023-09-01 DIAGNOSIS — Z981 Arthrodesis status: Secondary | ICD-10-CM

## 2023-09-01 DIAGNOSIS — G8918 Other acute postprocedural pain: Secondary | ICD-10-CM | POA: Diagnosis not present

## 2023-09-01 DIAGNOSIS — N184 Chronic kidney disease, stage 4 (severe): Secondary | ICD-10-CM | POA: Diagnosis present

## 2023-09-01 DIAGNOSIS — N186 End stage renal disease: Secondary | ICD-10-CM | POA: Diagnosis not present

## 2023-09-01 DIAGNOSIS — E1140 Type 2 diabetes mellitus with diabetic neuropathy, unspecified: Secondary | ICD-10-CM | POA: Diagnosis present

## 2023-09-01 DIAGNOSIS — K219 Gastro-esophageal reflux disease without esophagitis: Secondary | ICD-10-CM | POA: Diagnosis present

## 2023-09-01 DIAGNOSIS — M2578 Osteophyte, vertebrae: Secondary | ICD-10-CM | POA: Diagnosis present

## 2023-09-01 DIAGNOSIS — N189 Chronic kidney disease, unspecified: Secondary | ICD-10-CM | POA: Diagnosis not present

## 2023-09-01 DIAGNOSIS — Z7951 Long term (current) use of inhaled steroids: Secondary | ICD-10-CM

## 2023-09-01 DIAGNOSIS — N401 Enlarged prostate with lower urinary tract symptoms: Secondary | ICD-10-CM | POA: Diagnosis present

## 2023-09-01 DIAGNOSIS — M532X6 Spinal instabilities, lumbar region: Secondary | ICD-10-CM | POA: Diagnosis present

## 2023-09-01 DIAGNOSIS — N2581 Secondary hyperparathyroidism of renal origin: Secondary | ICD-10-CM | POA: Diagnosis present

## 2023-09-01 DIAGNOSIS — F17210 Nicotine dependence, cigarettes, uncomplicated: Secondary | ICD-10-CM | POA: Diagnosis present

## 2023-09-01 DIAGNOSIS — Z823 Family history of stroke: Secondary | ICD-10-CM

## 2023-09-01 DIAGNOSIS — R339 Retention of urine, unspecified: Secondary | ICD-10-CM | POA: Diagnosis not present

## 2023-09-01 DIAGNOSIS — M4316 Spondylolisthesis, lumbar region: Secondary | ICD-10-CM | POA: Diagnosis present

## 2023-09-01 DIAGNOSIS — B182 Chronic viral hepatitis C: Secondary | ICD-10-CM | POA: Diagnosis present

## 2023-09-01 DIAGNOSIS — Z794 Long term (current) use of insulin: Secondary | ICD-10-CM

## 2023-09-01 DIAGNOSIS — J449 Chronic obstructive pulmonary disease, unspecified: Secondary | ICD-10-CM | POA: Diagnosis present

## 2023-09-01 DIAGNOSIS — S46001D Unspecified injury of muscle(s) and tendon(s) of the rotator cuff of right shoulder, subsequent encounter: Secondary | ICD-10-CM | POA: Diagnosis not present

## 2023-09-01 DIAGNOSIS — Z8249 Family history of ischemic heart disease and other diseases of the circulatory system: Secondary | ICD-10-CM

## 2023-09-01 DIAGNOSIS — D631 Anemia in chronic kidney disease: Secondary | ICD-10-CM | POA: Diagnosis present

## 2023-09-01 DIAGNOSIS — E872 Acidosis, unspecified: Secondary | ICD-10-CM | POA: Diagnosis not present

## 2023-09-01 DIAGNOSIS — Z4789 Encounter for other orthopedic aftercare: Secondary | ICD-10-CM | POA: Diagnosis not present

## 2023-09-01 DIAGNOSIS — M48062 Spinal stenosis, lumbar region with neurogenic claudication: Secondary | ICD-10-CM

## 2023-09-01 DIAGNOSIS — K5901 Slow transit constipation: Secondary | ICD-10-CM | POA: Diagnosis not present

## 2023-09-01 DIAGNOSIS — N185 Chronic kidney disease, stage 5: Secondary | ICD-10-CM | POA: Diagnosis not present

## 2023-09-01 DIAGNOSIS — Z820 Family history of epilepsy and other diseases of the nervous system: Secondary | ICD-10-CM

## 2023-09-01 HISTORY — PX: LUMBAR PERCUTANEOUS PEDICLE SCREW 3 LEVEL: SHX5562

## 2023-09-01 HISTORY — PX: ANTERIOR LAT LUMBAR FUSION: SHX1168

## 2023-09-01 LAB — GLUCOSE, CAPILLARY
Glucose-Capillary: 80 mg/dL (ref 70–99)
Glucose-Capillary: 96 mg/dL (ref 70–99)
Glucose-Capillary: 168 mg/dL — ABNORMAL HIGH (ref 70–99)
Glucose-Capillary: 136 mg/dL — ABNORMAL HIGH (ref 70–99)

## 2023-09-01 SURGERY — ANTERIOR LATERAL LUMBAR FUSION 3 LEVELS
Anesthesia: General | Site: Spine Lumbar

## 2023-09-01 MED ORDER — LIDOCAINE-EPINEPHRINE 1 %-1:100000 IJ SOLN
INTRAMUSCULAR | Status: AC
  Filled 2023-09-01: qty 1

## 2023-09-01 MED ORDER — HYDROMORPHONE HCL 1 MG/ML IJ SOLN
INTRAMUSCULAR | Status: AC
  Filled 2023-09-01: qty 1

## 2023-09-01 MED ORDER — AMISULPRIDE (ANTIEMETIC) 5 MG/2ML IV SOLN
INTRAVENOUS | Status: AC
  Filled 2023-09-01: qty 4

## 2023-09-01 MED ORDER — ACETAMINOPHEN 650 MG RE SUPP: 650.0000 mg | RECTAL | Status: DC | PRN

## 2023-09-01 MED ORDER — BUPIVACAINE LIPOSOME 1.3 % IJ SUSP
INTRAMUSCULAR | Status: DC | PRN
  Administered 2023-09-01: 20 mL

## 2023-09-01 MED ORDER — KETAMINE HCL 50 MG/5ML IJ SOSY
PREFILLED_SYRINGE | INTRAMUSCULAR | Status: AC
  Filled 2023-09-01: qty 5

## 2023-09-01 MED ORDER — CHLORHEXIDINE GLUCONATE CLOTH 2 % EX PADS: 6.0000 | MEDICATED_PAD | Freq: Once | CUTANEOUS | Status: DC

## 2023-09-01 MED ORDER — PROPOFOL 10 MG/ML IV BOLUS
INTRAVENOUS | Status: DC | PRN
  Administered 2023-09-01: 20 mg via INTRAVENOUS
  Administered 2023-09-01: 160 mg via INTRAVENOUS
  Administered 2023-09-01 (×2): 20 mg via INTRAVENOUS
  Administered 2023-09-01: 100 ug/kg/min via INTRAVENOUS

## 2023-09-01 MED ORDER — ACETAMINOPHEN 500 MG PO TABS
1000.0000 mg | ORAL_TABLET | Freq: Once | ORAL | Status: AC
  Administered 2023-09-01: 1000 mg via ORAL
  Filled 2023-09-01: qty 2

## 2023-09-01 MED ORDER — INSULIN ASPART 100 UNIT/ML IJ SOLN
0.0000 [IU] | Freq: Every day | INTRAMUSCULAR | Status: DC
Start: 2023-09-01 — End: 2023-09-11

## 2023-09-01 MED ORDER — SODIUM BICARBONATE 8.4 % IV SOLN
INTRAVENOUS | Status: AC
  Filled 2023-09-01: qty 50

## 2023-09-01 MED ORDER — FLUTICASONE FUROATE-VILANTEROL 100-25 MCG/ACT IN AEPB
1.0000 | INHALATION_SPRAY | Freq: Every day | RESPIRATORY_TRACT | Status: DC
  Administered 2023-09-04 – 2023-09-11 (×8): 1 via RESPIRATORY_TRACT
  Filled 2023-09-01: qty 28

## 2023-09-01 MED ORDER — ONDANSETRON HCL 4 MG PO TABS
4.0000 mg | ORAL_TABLET | Freq: Four times a day (QID) | ORAL | Status: DC | PRN
  Administered 2023-09-02: 4 mg via ORAL
  Filled 2023-09-01: qty 1

## 2023-09-01 MED ORDER — DEXAMETHASONE SODIUM PHOSPHATE 10 MG/ML IJ SOLN
INTRAMUSCULAR | Status: DC | PRN
  Administered 2023-09-01: 5 mg via INTRAVENOUS

## 2023-09-01 MED ORDER — BUDESON-GLYCOPYRROL-FORMOTEROL 160-9-4.8 MCG/ACT IN AERO: 2.0000 | INHALATION_SPRAY | Freq: Two times a day (BID) | RESPIRATORY_TRACT | Status: DC | PRN

## 2023-09-01 MED ORDER — HYDROMORPHONE HCL 1 MG/ML IJ SOLN
0.2500 mg | INTRAMUSCULAR | Status: DC | PRN
  Administered 2023-09-01 (×4): 0.5 mg via INTRAVENOUS

## 2023-09-01 MED ORDER — FENTANYL CITRATE (PF) 100 MCG/2ML IJ SOLN
INTRAMUSCULAR | Status: AC
  Filled 2023-09-01: qty 2

## 2023-09-01 MED ORDER — CHLORHEXIDINE GLUCONATE 0.12 % MT SOLN
15.0000 mL | Freq: Once | OROMUCOSAL | Status: AC
  Administered 2023-09-01: 15 mL via OROMUCOSAL
  Filled 2023-09-01: qty 15

## 2023-09-01 MED ORDER — CEFAZOLIN SODIUM-DEXTROSE 2-4 GM/100ML-% IV SOLN
2.0000 g | Freq: Three times a day (TID) | INTRAVENOUS | Status: AC
Start: 2023-09-01 — End: 2023-09-03
  Administered 2023-09-01 – 2023-09-02 (×2): 2 g via INTRAVENOUS
  Filled 2023-09-01 (×2): qty 100

## 2023-09-01 MED ORDER — SODIUM CHLORIDE 0.9 % IV SOLN: INTRAVENOUS | Status: AC

## 2023-09-01 MED ORDER — ONDANSETRON HCL 4 MG/2ML IJ SOLN
INTRAMUSCULAR | Status: DC | PRN
  Administered 2023-09-01: 4 mg via INTRAVENOUS

## 2023-09-01 MED ORDER — GABAPENTIN 400 MG PO CAPS
400.0000 mg | ORAL_CAPSULE | Freq: Two times a day (BID) | ORAL | Status: DC
  Administered 2023-09-01 – 2023-09-11 (×19): 400 mg via ORAL
  Filled 2023-09-01 (×20): qty 1

## 2023-09-01 MED ORDER — CALCIUM ACETATE (PHOS BINDER) 667 MG PO CAPS
667.0000 mg | ORAL_CAPSULE | Freq: Two times a day (BID) | ORAL | Status: DC
  Administered 2023-09-02 – 2023-09-11 (×18): 667 mg via ORAL
  Filled 2023-09-01 (×20): qty 1

## 2023-09-01 MED ORDER — BUPIVACAINE HCL (PF) 0.5 % IJ SOLN
INTRAMUSCULAR | Status: AC
  Filled 2023-09-01: qty 30

## 2023-09-01 MED ORDER — ACETAMINOPHEN 10 MG/ML IV SOLN
INTRAVENOUS | Status: AC
  Filled 2023-09-01: qty 100

## 2023-09-01 MED ORDER — ORAL CARE MOUTH RINSE: 15.0000 mL | Freq: Once | OROMUCOSAL | Status: AC

## 2023-09-01 MED ORDER — HYDROMORPHONE HCL 1 MG/ML IJ SOLN
0.5000 mg | INTRAMUSCULAR | Status: DC | PRN
  Administered 2023-09-01 – 2023-09-10 (×4): 0.5 mg via INTRAVENOUS
  Filled 2023-09-01 (×5): qty 0.5

## 2023-09-01 MED ORDER — THROMBIN 5000 UNITS EX SOLR
CUTANEOUS | Status: AC
  Filled 2023-09-01: qty 5000

## 2023-09-01 MED ORDER — FENTANYL CITRATE (PF) 100 MCG/2ML IJ SOLN: 25.0000 ug | INTRAMUSCULAR | Status: DC | PRN

## 2023-09-01 MED ORDER — SODIUM CHLORIDE 0.9 % IV SOLN: INTRAVENOUS | Status: DC | PRN

## 2023-09-01 MED ORDER — PHENYLEPHRINE HCL-NACL 20-0.9 MG/250ML-% IV SOLN
INTRAVENOUS | Status: DC | PRN
  Administered 2023-09-01: 40 ug/min via INTRAVENOUS

## 2023-09-01 MED ORDER — BUPIVACAINE HCL (PF) 0.5 % IJ SOLN
INTRAMUSCULAR | Status: DC | PRN
  Administered 2023-09-01: 30 mL

## 2023-09-01 MED ORDER — POLYETHYLENE GLYCOL 3350 17 G PO PACK
17.0000 g | PACK | Freq: Every day | ORAL | Status: DC | PRN
Start: 2023-09-01 — End: 2023-09-11

## 2023-09-01 MED ORDER — ONDANSETRON HCL 4 MG/2ML IJ SOLN: 4.0000 mg | Freq: Four times a day (QID) | INTRAMUSCULAR | Status: DC | PRN

## 2023-09-01 MED ORDER — SODIUM BICARBONATE 8.4 % IV SOLN
INTRAVENOUS | Status: DC | PRN
Start: 2023-09-01 — End: 2023-09-01
  Administered 2023-09-01 (×3): 50 meq via INTRAVENOUS

## 2023-09-01 MED ORDER — 0.9 % SODIUM CHLORIDE (POUR BTL) OPTIME
TOPICAL | Status: DC | PRN
  Administered 2023-09-01: 1000 mL

## 2023-09-01 MED ORDER — UMECLIDINIUM BROMIDE 62.5 MCG/ACT IN AEPB
1.0000 | INHALATION_SPRAY | Freq: Every day | RESPIRATORY_TRACT | Status: DC
  Administered 2023-09-04 – 2023-09-11 (×8): 1 via RESPIRATORY_TRACT
  Filled 2023-09-01: qty 7

## 2023-09-01 MED ORDER — HYDROMORPHONE HCL 1 MG/ML IJ SOLN
INTRAMUSCULAR | Status: AC
  Filled 2023-09-01: qty 0.5

## 2023-09-01 MED ORDER — MIDAZOLAM HCL 2 MG/2ML IJ SOLN
INTRAMUSCULAR | Status: DC | PRN
  Administered 2023-09-01: 2 mg via INTRAVENOUS

## 2023-09-01 MED ORDER — SODIUM CHLORIDE 0.9% FLUSH
3.0000 mL | Freq: Two times a day (BID) | INTRAVENOUS | Status: DC
Start: 2023-09-01 — End: 2023-09-11
  Administered 2023-09-02 – 2023-09-11 (×16): 3 mL via INTRAVENOUS

## 2023-09-01 MED ORDER — OXYCODONE HCL 5 MG PO TABS: 5.0000 mg | ORAL_TABLET | ORAL | Status: DC | PRN

## 2023-09-01 MED ORDER — DEXAMETHASONE SODIUM PHOSPHATE 10 MG/ML IJ SOLN
INTRAMUSCULAR | Status: AC
Start: 2023-09-01 — End: ?
  Filled 2023-09-01: qty 1

## 2023-09-01 MED ORDER — SUCCINYLCHOLINE CHLORIDE 200 MG/10ML IV SOSY
PREFILLED_SYRINGE | INTRAVENOUS | Status: DC | PRN
  Administered 2023-09-01: 140 mg via INTRAVENOUS

## 2023-09-01 MED ORDER — ACETAMINOPHEN 325 MG PO TABS
650.0000 mg | ORAL_TABLET | ORAL | Status: DC | PRN
  Administered 2023-09-03 – 2023-09-08 (×4): 650 mg via ORAL
  Filled 2023-09-01 (×4): qty 2

## 2023-09-01 MED ORDER — SODIUM CHLORIDE 0.9 % IV SOLN
0.1500 ug/kg/min | INTRAVENOUS | Status: DC
  Administered 2023-09-01: .1 ug/kg/min via INTRAVENOUS
  Filled 2023-09-01: qty 2000

## 2023-09-01 MED ORDER — DEXMEDETOMIDINE HCL IN NACL 80 MCG/20ML IV SOLN
INTRAVENOUS | Status: DC | PRN
  Administered 2023-09-01: 4 ug via INTRAVENOUS
  Administered 2023-09-01: 8 ug via INTRAVENOUS

## 2023-09-01 MED ORDER — LACTATED RINGERS IV SOLN: INTRAVENOUS | Status: DC

## 2023-09-01 MED ORDER — BUPIVACAINE LIPOSOME 1.3 % IJ SUSP
INTRAMUSCULAR | Status: AC
  Filled 2023-09-01: qty 20

## 2023-09-01 MED ORDER — LIDOCAINE 2% (20 MG/ML) 5 ML SYRINGE
INTRAMUSCULAR | Status: AC
  Filled 2023-09-01: qty 5

## 2023-09-01 MED ORDER — FENTANYL CITRATE (PF) 250 MCG/5ML IJ SOLN
INTRAMUSCULAR | Status: AC
  Filled 2023-09-01: qty 5

## 2023-09-01 MED ORDER — INSULIN ASPART 100 UNIT/ML IJ SOLN: 0.0000 [IU] | INTRAMUSCULAR | Status: DC | PRN

## 2023-09-01 MED ORDER — FENTANYL CITRATE (PF) 250 MCG/5ML IJ SOLN
INTRAMUSCULAR | Status: DC | PRN
  Administered 2023-09-01 (×6): 50 ug via INTRAVENOUS

## 2023-09-01 MED ORDER — MENTHOL 3 MG MT LOZG: 1.0000 | LOZENGE | OROMUCOSAL | Status: DC | PRN

## 2023-09-01 MED ORDER — PROPOFOL 10 MG/ML IV BOLUS
INTRAVENOUS | Status: AC
  Filled 2023-09-01: qty 20

## 2023-09-01 MED ORDER — CELECOXIB 200 MG PO CAPS
200.0000 mg | ORAL_CAPSULE | Freq: Once | ORAL | Status: AC
  Administered 2023-09-01: 200 mg via ORAL
  Filled 2023-09-01: qty 1

## 2023-09-01 MED ORDER — ALBUMIN HUMAN 5 % IV SOLN: INTRAVENOUS | Status: DC | PRN

## 2023-09-01 MED ORDER — LIDOCAINE 2% (20 MG/ML) 5 ML SYRINGE
INTRAMUSCULAR | Status: DC | PRN
  Administered 2023-09-01: 60 mg via INTRAVENOUS

## 2023-09-01 MED ORDER — METHOCARBAMOL 500 MG PO TABS
500.0000 mg | ORAL_TABLET | Freq: Four times a day (QID) | ORAL | Status: DC | PRN
  Administered 2023-09-01 – 2023-09-11 (×14): 500 mg via ORAL
  Filled 2023-09-01 (×14): qty 1

## 2023-09-01 MED ORDER — OXYCODONE HCL 5 MG PO TABS
10.0000 mg | ORAL_TABLET | ORAL | Status: DC | PRN
  Administered 2023-09-02 – 2023-09-11 (×42): 10 mg via ORAL
  Filled 2023-09-01 (×43): qty 2

## 2023-09-01 MED ORDER — SUCCINYLCHOLINE CHLORIDE 200 MG/10ML IV SOSY
PREFILLED_SYRINGE | INTRAVENOUS | Status: AC
  Filled 2023-09-01: qty 10

## 2023-09-01 MED ORDER — LABETALOL HCL 5 MG/ML IV SOLN
10.0000 mg | INTRAVENOUS | Status: DC | PRN
  Administered 2023-09-01 – 2023-09-02 (×2): 10 mg via INTRAVENOUS
  Filled 2023-09-01 (×2): qty 4

## 2023-09-01 MED ORDER — AMISULPRIDE (ANTIEMETIC) 5 MG/2ML IV SOLN
10.0000 mg | Freq: Once | INTRAVENOUS | Status: AC | PRN
  Administered 2023-09-01: 10 mg via INTRAVENOUS

## 2023-09-01 MED ORDER — FLEET ENEMA RE ENEM: 1.0000 | ENEMA | Freq: Once | RECTAL | Status: DC | PRN

## 2023-09-01 MED ORDER — METHOCARBAMOL 1000 MG/10ML IJ SOLN
500.0000 mg | Freq: Once | INTRAMUSCULAR | Status: AC
  Administered 2023-09-01: 500 mg via INTRAVENOUS

## 2023-09-01 MED ORDER — PHENOL 1.4 % MT LIQD: 1.0000 | OROMUCOSAL | Status: DC | PRN

## 2023-09-01 MED ORDER — ALBUTEROL SULFATE (2.5 MG/3ML) 0.083% IN NEBU: 3.0000 mL | INHALATION_SOLUTION | Freq: Four times a day (QID) | RESPIRATORY_TRACT | Status: DC | PRN

## 2023-09-01 MED ORDER — KETAMINE HCL 10 MG/ML IJ SOLN
INTRAMUSCULAR | Status: DC | PRN
  Administered 2023-09-01 (×2): 10 mg via INTRAVENOUS
  Administered 2023-09-01: 20 mg via INTRAVENOUS
  Administered 2023-09-01: 10 mg via INTRAVENOUS

## 2023-09-01 MED ORDER — MIDAZOLAM HCL 2 MG/2ML IJ SOLN
INTRAMUSCULAR | Status: AC
  Filled 2023-09-01: qty 2

## 2023-09-01 MED ORDER — DOCUSATE SODIUM 100 MG PO CAPS
100.0000 mg | ORAL_CAPSULE | Freq: Two times a day (BID) | ORAL | Status: DC
  Administered 2023-09-01 – 2023-09-05 (×8): 100 mg via ORAL
  Filled 2023-09-01 (×13): qty 1

## 2023-09-01 MED ORDER — INSULIN ASPART 100 UNIT/ML IJ SOLN
0.0000 [IU] | Freq: Three times a day (TID) | INTRAMUSCULAR | Status: DC
Start: 2023-09-02 — End: 2023-09-11
  Administered 2023-09-02 (×3): 3 [IU] via SUBCUTANEOUS
  Administered 2023-09-03: 2 [IU] via SUBCUTANEOUS
  Administered 2023-09-03 (×2): 3 [IU] via SUBCUTANEOUS
  Administered 2023-09-04: 2 [IU] via SUBCUTANEOUS
  Administered 2023-09-04 – 2023-09-05 (×3): 3 [IU] via SUBCUTANEOUS
  Administered 2023-09-05 (×2): 2 [IU] via SUBCUTANEOUS
  Administered 2023-09-06: 3 [IU] via SUBCUTANEOUS
  Administered 2023-09-06: 2 [IU] via SUBCUTANEOUS
  Administered 2023-09-07: 3 [IU] via SUBCUTANEOUS
  Administered 2023-09-08: 2 [IU] via SUBCUTANEOUS
  Administered 2023-09-08 – 2023-09-09 (×2): 3 [IU] via SUBCUTANEOUS
  Administered 2023-09-09 – 2023-09-11 (×2): 2 [IU] via SUBCUTANEOUS

## 2023-09-01 MED ORDER — IRBESARTAN 75 MG PO TABS
37.5000 mg | ORAL_TABLET | Freq: Every day | ORAL | Status: DC
  Administered 2023-09-01 – 2023-09-02 (×2): 37.5 mg via ORAL
  Filled 2023-09-01 (×3): qty 0.5

## 2023-09-01 MED ORDER — SODIUM CHLORIDE 0.9% FLUSH: 3.0000 mL | INTRAVENOUS | Status: DC | PRN

## 2023-09-01 MED ORDER — ACETAMINOPHEN 10 MG/ML IV SOLN
INTRAVENOUS | Status: DC | PRN
Start: 2023-09-01 — End: 2023-09-01
  Administered 2023-09-01: 1000 mg via INTRAVENOUS

## 2023-09-01 MED ORDER — CALCITRIOL 0.25 MCG PO CAPS
0.2500 ug | ORAL_CAPSULE | ORAL | Status: DC
  Administered 2023-09-02 – 2023-09-11 (×5): 0.25 ug via ORAL
  Filled 2023-09-01 (×5): qty 1

## 2023-09-01 MED ORDER — ONDANSETRON HCL 4 MG/2ML IJ SOLN
INTRAMUSCULAR | Status: AC
  Filled 2023-09-01: qty 2

## 2023-09-01 MED ORDER — FENTANYL CITRATE (PF) 100 MCG/2ML IJ SOLN
25.0000 ug | INTRAMUSCULAR | Status: DC | PRN
  Administered 2023-09-01 (×3): 50 ug via INTRAVENOUS

## 2023-09-01 MED ORDER — METHOCARBAMOL 1000 MG/10ML IJ SOLN: 500.0000 mg | Freq: Four times a day (QID) | INTRAMUSCULAR | Status: DC | PRN

## 2023-09-01 MED ORDER — METHOCARBAMOL 1000 MG/10ML IJ SOLN
INTRAMUSCULAR | Status: AC
  Filled 2023-09-01: qty 10

## 2023-09-01 MED ORDER — HYDROMORPHONE HCL 1 MG/ML IJ SOLN
INTRAMUSCULAR | Status: DC | PRN
  Administered 2023-09-01 (×2): .25 mg via INTRAVENOUS
  Administered 2023-09-01: .5 mg via INTRAVENOUS

## 2023-09-01 MED ORDER — AMLODIPINE BESYLATE 10 MG PO TABS
10.0000 mg | ORAL_TABLET | Freq: Every day | ORAL | Status: DC | PRN
  Administered 2023-09-01: 10 mg via ORAL
  Filled 2023-09-01: qty 1

## 2023-09-01 MED ORDER — THROMBIN 5000 UNITS EX SOLR: OROMUCOSAL | Status: DC | PRN

## 2023-09-01 MED ORDER — ACETAMINOPHEN 10 MG/ML IV SOLN: 1000.0000 mg | Freq: Once | INTRAVENOUS | Status: DC

## 2023-09-01 MED ORDER — LIDOCAINE-EPINEPHRINE 1 %-1:100000 IJ SOLN
INTRAMUSCULAR | Status: DC | PRN
  Administered 2023-09-01 (×2): 10 mL
  Administered 2023-09-01: 7 mL

## 2023-09-01 MED ORDER — CEFAZOLIN SODIUM-DEXTROSE 2-4 GM/100ML-% IV SOLN
2.0000 g | INTRAVENOUS | Status: AC
  Administered 2023-09-01: 2 g via INTRAVENOUS
  Filled 2023-09-01: qty 100

## 2023-09-01 SURGICAL SUPPLY — 95 items
ADH SKN CLS APL DERMABOND .7 (GAUZE/BANDAGES/DRESSINGS) ×6
BAG COUNTER SPONGE SURGICOUNT (BAG) ×4 IMPLANT
BAG SPNG CNTER NS LX DISP (BAG) ×4
BLADE CLIPPER SURG (BLADE) IMPLANT
BUR 14 MATCH 3 (BUR) IMPLANT
BUR MR8 14 BALL 5 (BUR) IMPLANT
BUR PRECISION FLUTE 5.0 (BURR) IMPLANT
BUR SURG IBUR 4X12.5 (BURR) ×2 IMPLANT
BURR 14 MATCH 3 (BUR) ×2
BURR MR8 14 BALL 5 (BUR)
BURR SURG IBUR 4X12.5 (BURR) ×2
CANISTER SUCT 3000ML PPV (MISCELLANEOUS) ×2 IMPLANT
CAP PUSHER PTP (ORTHOPEDIC DISPOSABLE SUPPLIES) IMPLANT
CLIP SPRING STIM LLIF SAFEOP (CLIP) IMPLANT
COVERAGE SUPPORT O-ARM STEALTH (MISCELLANEOUS) ×2 IMPLANT
DERMABOND ADVANCED .7 DNX12 (GAUZE/BANDAGES/DRESSINGS) ×2 IMPLANT
DILATOR INSULATED LLIF 8-13-18 (NEUROSURGERY SUPPLIES) IMPLANT
DISSECTOR BLUNT TIP ENDO 5MM (MISCELLANEOUS) IMPLANT
DRAPE C-ARM 42X72 X-RAY (DRAPES) ×6 IMPLANT
DRAPE C-ARMOR (DRAPES) ×2 IMPLANT
DRAPE INCISE IOBAN 66X45 STRL (DRAPES) IMPLANT
DRAPE LAPAROTOMY 100X72X124 (DRAPES) ×4 IMPLANT
DRAPE SHEET LG 3/4 BI-LAMINATE (DRAPES) ×8 IMPLANT
DRAPE SURG 17X23 STRL (DRAPES) ×2 IMPLANT
DRAPE SURG ORHT 6 SPLT 77X108 (DRAPES) IMPLANT
DRSG OPSITE POSTOP 4X6 (GAUZE/BANDAGES/DRESSINGS) IMPLANT
DURAPREP 26ML APPLICATOR (WOUND CARE) ×4 IMPLANT
ELECT BLADE INSULATED 6.5IN (ELECTROSURGICAL) ×4
ELECT COATED BLADE 2.86 ST (ELECTRODE) ×2 IMPLANT
ELECT KIT SAFEOP SSEP/SURF (KITS) ×2
ELECT REM PT RETURN 9FT ADLT (ELECTROSURGICAL) ×4
ELECTRODE BLDE INSULATED 6.5IN (ELECTROSURGICAL) ×4 IMPLANT
ELECTRODE KT SAFEOP SSEP/SURF (KITS) IMPLANT
ELECTRODE REM PT RTRN 9FT ADLT (ELECTROSURGICAL) ×4 IMPLANT
FEE COVERAGE SUPPORT O-ARM (MISCELLANEOUS) ×2 IMPLANT
GAUZE 4X4 16PLY ~~LOC~~+RFID DBL (SPONGE) IMPLANT
GAUZE SPONGE 4X4 12PLY STRL (GAUZE/BANDAGES/DRESSINGS) IMPLANT
GLOVE BIO SURGEON STRL SZ7 (GLOVE) ×12 IMPLANT
GLOVE BIOGEL PI IND STRL 7.5 (GLOVE) ×10 IMPLANT
GLOVE ECLIPSE 7.5 STRL STRAW (GLOVE) ×4 IMPLANT
GOWN STRL REUS W/ TWL LRG LVL3 (GOWN DISPOSABLE) ×4 IMPLANT
GOWN STRL REUS W/ TWL XL LVL3 (GOWN DISPOSABLE) ×10 IMPLANT
GOWN STRL REUS W/TWL 2XL LVL3 (GOWN DISPOSABLE) IMPLANT
GOWN STRL REUS W/TWL LRG LVL3 (GOWN DISPOSABLE) ×4
GOWN STRL REUS W/TWL XL LVL3 (GOWN DISPOSABLE) ×10
GRAFT BONE PROTEIOS LRG 5CC (Orthopedic Implant) IMPLANT
GRAFT BONE PROTEIOS MED 2.5CC (Orthopedic Implant) IMPLANT
GUIDEWIRE LLIF TT 310 (WIRE) IMPLANT
HEMOSTAT POWDER KIT SURGIFOAM (HEMOSTASIS) ×4 IMPLANT
KIT BASIN OR (CUSTOM PROCEDURE TRAY) ×4 IMPLANT
KIT TURNOVER KIT B (KITS) ×4 IMPLANT
KNIFE ANNULOTOMY GREY RETRACT (ORTHOPEDIC DISPOSABLE SUPPLIES) IMPLANT
LIF ILLUMINATION SYSTEM STERIL (SYSTAGENIX WOUND MANAGEMENT) ×2
MARKER SPHERE PSV REFLC NDI (MISCELLANEOUS) ×10 IMPLANT
MATRIX SPINE STRIP NEOCORE 5CC (Putty) IMPLANT
MATRIX STRIP NEOCORE 12C (Putty) IMPLANT
MODULE POSITIONING LIF 2.0 (INSTRUMENTS) IMPLANT
NDL HYPO 18GX1.5 BLUNT FILL (NEEDLE) IMPLANT
NDL HYPO 21X1.5 SAFETY (NEEDLE) ×2 IMPLANT
NDL HYPO 22X1.5 SAFETY MO (MISCELLANEOUS) ×4 IMPLANT
NEEDLE HYPO 18GX1.5 BLUNT FILL (NEEDLE) IMPLANT
NEEDLE HYPO 21X1.5 SAFETY (NEEDLE) ×2 IMPLANT
NEEDLE HYPO 22X1.5 SAFETY MO (MISCELLANEOUS) ×4 IMPLANT
NS IRRIG 1000ML POUR BTL (IV SOLUTION) ×4 IMPLANT
PACK LAMINECTOMY NEURO (CUSTOM PROCEDURE TRAY) ×4 IMPLANT
PAD ARMBOARD 7.5X6 YLW CONV (MISCELLANEOUS) ×6 IMPLANT
PENCIL BUTTON HOLSTER BLD 10FT (ELECTRODE) IMPLANT
PIN BONE FIX 100 (PIN) IMPLANT
PROBE BALL TIP LLIF SAFEOP (NEUROSURGERY SUPPLIES) IMPLANT
RETRACTOR EXT BLADE 26 WIDE SU (ORTHOPEDIC DISPOSABLE SUPPLIES) IMPLANT
ROD LORD MIS TI 5.5X100 (Rod) IMPLANT
SCREW CANN FENS 6.5X45 (Screw) IMPLANT
SCREW CANN INV FENS 5.5X45 (Screw) IMPLANT
SET SCREW (Screw) ×16 IMPLANT
SET SCREW SPNE (Screw) IMPLANT
SHIM INTRADISCAL LTP N DISP (ORTHOPEDIC DISPOSABLE SUPPLIES) IMPLANT
SPACER IDENTITI 18X55X10 10D (Spacer) IMPLANT
SPACER IDENTITI 8X18X50 10D (Spacer) IMPLANT
SPACER IDENTITI 8X18X55 10D (Spacer) IMPLANT
SPIKE FLUID TRANSFER (MISCELLANEOUS) ×4 IMPLANT
SPONGE NEURO XRAY DETECT 1X3 (DISPOSABLE) IMPLANT
SPONGE SURGIFOAM ABS GEL SZ50 (HEMOSTASIS) IMPLANT
SPONGE T-LAP 4X18 ~~LOC~~+RFID (SPONGE) IMPLANT
SPONGE TONSIL 1 RF SGL (DISPOSABLE) IMPLANT
STAPLER VISISTAT 35W (STAPLE) ×2 IMPLANT
SUT MNCRL AB 4-0 PS2 18 (SUTURE) ×4 IMPLANT
SUT VIC AB 0 CT1 18XCR BRD8 (SUTURE) ×2 IMPLANT
SUT VIC AB 0 CT1 8-18 (SUTURE) ×4
SUT VIC AB 2-0 CP2 18 (SUTURE) ×4 IMPLANT
SYR 20ML LL LF (SYRINGE) ×2 IMPLANT
SYSTEM ILLUMINATION LIF STERIL (SYSTAGENIX WOUND MANAGEMENT) IMPLANT
TOWEL GREEN STERILE (TOWEL DISPOSABLE) ×4 IMPLANT
TOWEL GREEN STERILE FF (TOWEL DISPOSABLE) ×4 IMPLANT
TRAY FOLEY MTR SLVR 16FR STAT (SET/KITS/TRAYS/PACK) ×2 IMPLANT
WATER STERILE IRR 1000ML POUR (IV SOLUTION) ×4 IMPLANT

## 2023-09-01 NOTE — Progress Notes (Signed)
Pt. Blood pressure elevated 215/100 after receiving home medication and PRN medication MD.notified see Mar for new orders

## 2023-09-01 NOTE — Op Note (Signed)
Procedure(s): DIRECT LUMBAR INTERBODY FUSION, LUMBAR TWO-LUMBAR THREE, LUMBAR THREE-LUMBAR FOUR, LUMBAR FOUR-LUMBAR FIVE, RIGHT PRONE TRANSPSOAS, LUMBAR TWO LUMBAR FIVE POSTERIOR PERCUTANEOUS FUSION LUMBAR FOUR-LUMBAR FIVE MINIMALLY INVASIVE LAMINECTOMY, FORAMINOTOMY L2-5 LUMBAR PERCUTANEOUS PEDICLE SCREW 3 LEVEL Application of O-Arm APPLICATION OF CELL SAVER Procedure Note  Curtis Hartman male 62 y.o. 09/01/2023  Procedure(s) and Anesthesia Type:    * DIRECT LUMBAR INTERBODY FUSION, LUMBAR TWO-LUMBAR THREE, LUMBAR THREE-LUMBAR FOUR, LUMBAR FOUR-LUMBAR FIVE, RIGHT PRONE TRANSPSOAS, LUMBAR TWO LUMBAR FIVE POSTERIOR PERCUTANEOUS FUSION LUMBAR FOUR-LUMBAR FIVE MINIMALLY INVASIVE LAMINECTOMY, FORAMINOTOMY - General    * L2-5 LUMBAR PERCUTANEOUS PEDICLE SCREW 3 LEVEL - General    * Application of O-Arm - General    * APPLICATION OF CELL SAVER - General  Surgeons and Role:    Maisie Fus, Coy Saunas, MD - Primary   Indications: Patient is a 62 y.o. male with hx of C3-C7 PCDF, C3-4 ACDF, CKI,  developed progressive mechanical back pain and bilateral radicular symptoms refractory to medical therapy, PT, pain management.  He was found to have severe lumbar spine disease, severe stenosis, spondylolisthesis and sagittal and coronal imbalance. He wished to proceed with lumbar interbody fusion.  Risks, benefits, alternatives, and expected convalescence were discussed with her.  Risks discussed included, but were not limited to bleeding, pain, infection, scar, spinal fluid leak, neurologic deficit, instability, pseudoarthrosis, damage to nearby organs, and death.  Informed consent was obtained.      Surgeon: Bedelia Person   Assistants: Patrici Ranks, PA.  Please note no qualified trainees were available to assist with the procedure.  Assistance was required through the entirety of the procedure.  Anesthesia: General endotracheal anesthesia   Procedure Detail  L2-3 anterior/lateral interbody  fusion via right retroperitoneal transpsoas approach in prone position  L3-4 anterior/lateral interbody fusion via right retroperitoneal transpsoas approach in prone position  L4-5 anterior/lateral interbody fusion via right retroperitoneal transpsoas approach in prone position Insertion of interbody cages L2-3, L3-4, L4-5 Posterior percutaneous arthrodesis L2-3, L3-4, L4-5 Segmental instrumentation with bilateral pedicle screws/rods, L2-3-4-5 Use of intraoperative CT and neuronavigation with Stealth/O-arm  The patient was brought to the operative room.  General anesthesia was induced and patient was intubated by the anesthesia service.  After appropriate lines and monitors were placed, patient was positioned prone on open [place] table.  All pressure points padded and the eyes were protected.  The patient and the bed was positioned for a true lateral and AP C-arm x-rays at each level.  The flank and lower back was preprepped with alcohol and prepped and draped in sterile fashion.  A timeout was performed.  1% at lidocaine with epinephrine was injected in the planned incision.  Using the C-arm x-ray, a oblique incision was planned over the right flank to span the 3 disc spaces at the concavity.  Incision was made with a 10 blade and the fascia was opened.  The external oblique, internal oblique, and transversalis muscle and transversalis fascia were opened bluntly with spreading instruments.  The retroperitoneal space was dissected and the peritoneal contents were reflected anteriorly.  The quadratus lumborum, transverse process and finally the psoas muscle was dissected out.  A initial dilator was placed on the lateral surface of the psoas muscle over the L3-4 disc space as confirmed by x-ray.  The dilator was then passed through the psoas muscle and docked onto the disc.  Stimulation showed there was no proximity of any lumbar plexus nerves.  The dilator was secured in place with a  K wire and subsequent  dilators were passed and stimulated and again yielded no neural activity nearby.  Retractor was then passed onto the disc space and secured in place with a bed attachment.  The dilators were removed and the field on the posterior blade were directly stimulated.  There was no visual evidence or electromyographic evidence of nerves in the field.  The retractor was then opened anteriorly and again the field was stimulated with no evidence of nerves in the field.  Annulotomy was performed with a retractable blade and discectomy was performed with pituitary rongeurs and box cutters.  The contralateral annulus was released with Cobb and the disc space was prepared with curettes and rasps.  Trial implants were placed and with the aid of fluoroscopy with AP and lateral projections, a size 8 mm graft was placed.  The retractor was then removed.    Attention was then turned to the L4-5 disc space where the lateral aspect of the psoas was palpated and initial dilator was placed over it with positioning confirmed by x-ray.  The dilator was then placed over the lateral osteophyte at the disc space and stimulated which revealed the lumbar plexus nerves were posterior to the dilator but remote.  In similar fashion, subsequent dilators were placed and stimulated and finally the retractor was placed and secured to the bed attachment.  The field was stimulated and no nerves were able to be identified in the field.  Posterior shim was placed to lock the retractor in place and the retractor blades were opened anteriorly and again the field was stimulated with no evidence of nerves in the field.  The collapsed disc space was entered with annulotomy using x-ray guidance.  Subsequently, Cobb was used to release the contralateral annulus.  This allowed good opening of the disc space.  The disc was fairly degenerated, with minimal disc remnants, but complete discectomy was performed with curettes and pituitary rongeurs.  The endplates  were prepared with rasps.  Trials and a final size 10  implant was then placed under x-ray guidance with good resulting restoration of height and listhesis reduction.  The retractor was then removed.    Attention was then turned to L2-3.  Initial dilator was placed over the lateral psoas muscle.  The lateral psoas was stimulated to ensure no nerves were nearby.  Initial dilator was placed over the L2-3  disc space osteophyte as confirmed on x-ray.  Stimulation revealed no nearby plexus nerves.  Subsequent dilators were placed and stimulated and finally retractor was placed and secured with bed attachment.  The field was stimulated and there was no evidence of lumbar plexus nerves in the field including at the posterior blade.  The posterior blade was secured with a shim and the retractor was opened cranially, caudally, and anteriorly.  The field was again stimulated with no identification of lumbar plexus nerves.  The collapsed disc space was similarly opened initially with an osteotome and discectomy was performed with rongeurs and ring curettes and the endplates were prepared with rasps.  Contralateral annulus was released with Cobb instruments.  Trials were placed and a size 8 mm lordotic graft was selected and placed and felt very snug.  The retractor was then removed.  There were no changes in the femoral nerve SSEPs throughout the case.  Final x-rays showed good reduction of spondylolisthesis and lateral listhesis, restoration of disc space height and foraminal height, and partial correction of her coronal deformity.  The retroperitoneal space was then  irrigated and examined with no evidence of any bleeding.  The fascia was closed with 0 Vicryl stitches.  The dermal layer was closed with 2-0 Vicryl stitches.  Skin was closed with 4-0 Monocryl subcuticular manner followed by Dermabond.  Small stab incision was made and right PSIS pin was placed, and intraoperative CT obtained to allow for neuronavigation.   Using the Stealth navigation, paramedian back incisions were planned for pedicle screw trajectories.  Incisions were made with a 10 blade.  Navigated high speed drill was used to cannulate the pedicles at L2, L3, L4, and L5 bilaterally, and pedicles were then tapped using navigation.  Ball ended feeler confirmed good cannulation with no breaches.  Appropriately sized pedicle screws were placed using navigation with good purchase.   Rods were then passed through the screw towers bilaterally and secured with screw caps.  AP and lateral X-ray confirmed good placement of instrumentation.  The screw caps were final tightened and the towers removed.  Navigated drill was used to decorticate the facet joints at L2-3, L3-4, and L4-5 bilaterally and remaining bone graft was placed in the facet and lateral gutter.  Wounds were irrigated thoroughly .  Exparel mixed with Marcaine was injected into the paraspinous muscles and subcutaneous tissues bilaterally.  The fascia was closed with 0 Vicryl stitches.  The dermal layer was closed with 2-0 Vicryl stitches in buried interrupted fashion.  The skin incisions were closed with 4-0 Monocryl subcuticular manner followed by Dermabond.  The PSIS pin was removed and small incision closed with 3-0 vicryl in buried fashion and Dermabond.  Sterile dressings were placed.  Patient was then flipped supine and extubated by the anesthesia service following commands and all 4 extremities.  All counts were correct at the end of surgery.  No complications were noted.   Findings: Severe degenerative disease, good reduction of grade 2 spondylolisthesis at L4-5, coronal imbalance and improvement of sagittal parameters  Estimated Blood Loss:  250 ml         Drains: none         Total IV Fluids: see anesthesia records  Blood Given: none          Specimens: none         Implants:  Alpha[employer]ec Identi-Ti cages 8 x 18 x 55 mm with 10 degrees lordosis at L2-3 8 x 18 x 50 mm with 10 degrees  lordosis at L3-4 10 x 18 x 55 mm with 10 degrees lordosis at L4-5 5.5 x 45 mm pedicle screws bilaterally at L2, L3, L4 6.5 x 45 mm pedicle screws bilaterally at L5  100 mm rods x 2 NeoCore, ProteiOs        Complications:  * No complications entered in OR log *         Disposition: PACU - hemodynamically stable.         Condition: stable

## 2023-09-01 NOTE — Transfer of Care (Signed)
Immediate Anesthesia Transfer of Care Note  Patient: Curtis Hartman  Procedure(s) Performed: DIRECT LUMBAR INTERBODY FUSION, LUMBAR TWO-LUMBAR THREE, LUMBAR THREE-LUMBAR FOUR, LUMBAR FOUR-LUMBAR FIVE, RIGHT PRONE TRANSPSOAS, LUMBAR TWO LUMBAR FIVE POSTERIOR PERCUTANEOUS FUSION LUMBAR FOUR-LUMBAR FIVE MINIMALLY INVASIVE LAMINECTOMY, FORAMINOTOMY (Spine Lumbar) L2-5 LUMBAR PERCUTANEOUS PEDICLE SCREW 3 LEVEL Application of O-Arm APPLICATION OF CELL SAVER  Patient Location: PACU  Anesthesia Type:General  Level of Consciousness: drowsy and patient cooperative  Airway & Oxygen Therapy: Patient Spontanous Breathing and Patient connected to face mask oxygen  Post-op Assessment: Report given to RN and Post -op Vital signs reviewed and stable  Post vital signs: Reviewed and stable  Last Vitals:  Vitals Value Taken Time  BP 151/113 09/01/23 1628  Temp    Pulse 73 09/01/23 1629  Resp 25 09/01/23 1629  SpO2 100 % 09/01/23 1629  Vitals shown include unfiled device data.  Last Pain:  Vitals:   09/01/23 0839  TempSrc:   PainSc: 9       Patients Stated Pain Goal: 1 (09/01/23 0839)  Complications: No notable events documented.

## 2023-09-01 NOTE — Progress Notes (Signed)
Verbal phone order labetalol 10 mg PRN every 2 hour IV for systolic B/P greater then 180 - MD Conchita Paris

## 2023-09-01 NOTE — H&P (Signed)
CC: back pain and radicular pain  HPI:     Patient is a 62 y.o. male with hx of C3-C7 PCDF developed progressive mechanical back pain and bilateral radicular symptoms refractory to medical therapy, PT, pain management.  He was found to have severe lumbar spine disease, severe stenosis, spondylolisthesis and coronal imbalance.    Patient Active Problem List   Diagnosis Date Noted   COPD GOLD ? 06/10/2023   Cigarette smoker 06/10/2023   S/P cervical spinal fusion 11/05/2022   Chronic hepatitis C (HCC) 06/25/2022   IBS (irritable bowel syndrome) 06/25/2022   GERD (gastroesophageal reflux disease) 06/25/2022   Impaired mobility and ADLs 11/05/2021   CKD (chronic kidney disease) stage 3, GFR 30-59 ml/min (HCC) 10/29/2021   Hypertension 10/29/2021   T2DM (type 2 diabetes mellitus) (HCC) 10/29/2021   Cervical myelopathy with cervical radiculopathy (HCC) 09/23/2021   Past Medical History:  Diagnosis Date   Arthritis    CKD (chronic kidney disease)    COPD (chronic obstructive pulmonary disease) (HCC)    Diabetes mellitus without complication (HCC)    type 2   GERD (gastroesophageal reflux disease)    Headache    Hx Migraines   Hepatitis    Hypertension    Neuropathy     Past Surgical History:  Procedure Laterality Date   ANTERIOR CERVICAL DECOMP/DISCECTOMY FUSION N/A 11/05/2022   Procedure: Anterior Cervical Decompression/discectomy Fusion Cervical Four-Cervical Five;  Surgeon: Bedelia Person, MD;  Location: [facility];  Service: Neurosurgery;  Laterality: N/A;  3C   BACK SURGERY  2023   Cervical Spine   BIOPSY  03/03/2022   Procedure: BIOPSY;  Surgeon: Lanelle Bal, DO;  Location: [facility];  Service: Endoscopy;;   COLONOSCOPY WITH PROPOFOL N/A 03/03/2022   Procedure: COLONOSCOPY WITH PROPOFOL;  Surgeon: Lanelle Bal, DO;  Location: [facility];  Service: Endoscopy;  Laterality: N/A;  8:00am   ESOPHAGOGASTRODUODENOSCOPY (EGD) WITH PROPOFOL N/A 03/03/2022    Procedure: ESOPHAGOGASTRODUODENOSCOPY (EGD) WITH PROPOFOL;  Surgeon: Lanelle Bal, DO;  Location: [facility];  Service: Endoscopy;  Laterality: N/A;   foot right partial toe amputation Right    FOOT SURGERY Right    x3   hernia surgery x4     POLYPECTOMY  03/03/2022   Procedure: POLYPECTOMY;  Surgeon: Lanelle Bal, DO;  Location: [facility];  Service: Endoscopy;;    Medications Prior to Admission  Medication Sig Dispense Refill Last Dose   amLODipine (NORVASC) 10 MG tablet Take 10 mg by mouth daily as needed (diastolic blood pressure above 90).   Past Week   calcitRIOL (ROCALTROL) 0.25 MCG capsule Take 0.25 mcg by mouth 3 (three) times a week.   Past Week   calcium acetate (PHOSLO) 667 MG tablet Take 667 mg by mouth 2 (two) times daily.   Past Week   fluticasone (FLONASE) 50 MCG/ACT nasal spray Place 2 sprays into both nostrils daily as needed for allergies (congestion).   08/31/2023   gabapentin (NEURONTIN) 400 MG capsule Take 400 mg by mouth 2 (two) times daily.   09/01/2023 at 0530   insulin aspart (NOVOLOG) 100 UNIT/ML injection Inject 2-10 Units into the skin 3 (three) times daily as needed (blood sugar over 150).   Past Week   LANTUS 100 UNIT/ML injection Inject 5 Units into the skin at bedtime as needed (blood sugar over 150).   08/31/2023 at 2200   morphine (MSIR) 15 MG tablet Take 15 mg by mouth every 6 (six) hours  as needed for severe pain.   09/01/2023 at 0530   olmesartan (BENICAR) 5 MG tablet Take 5 mg by mouth daily as needed (diastolic blood pressure above 90).   Past Week   Wheat Dextrin (BENEFIBER) POWD Mix 2 - 3 teaspoons in 8 ounces of water daily in the mornings. 475 g 0 Past Month   albuterol (PROAIR HFA) 108 (90 Base) MCG/ACT inhaler 2 puffs every 4 hours as needed only  if your can't catch your breath 18 g 11 More than a month   azithromycin (ZITHROMAX) 250 MG tablet Take 2 on day one then 1 daily x 4 days (Patient not taking: Reported on 08/19/2023) 6  tablet 0 Not Taking   Budeson-Glycopyrrol-Formoterol (BREZTRI AEROSPHERE) 160-9-4.8 MCG/ACT AERO Inhale 2 puffs into the lungs in the morning and at bedtime. (Patient taking differently: Inhale 2 puffs into the lungs 2 (two) times daily as needed (shortness of breath).)   More than a month   LOKELMA 10 g PACK packet Take 10 g by mouth daily as needed (high potassium).   More than a month   No Active Allergies  Social History   Tobacco Use   Smoking status: Every Day    Current packs/day: 0.25    Types: Cigarettes    Passive exposure: Never   Smokeless tobacco: Never   Tobacco comments:    Pt smokes a couple cigarettes a day (as of 08/26/23)  Substance Use Topics   Alcohol use: Not Currently    Family History  Problem Relation Age of Onset   Heart disease Mother    Seizures Father    Stroke Maternal Grandfather    Liver disease Paternal Grandmother      Review of Systems Pertinent items are noted in HPI.  Objective:   Patient Vitals for the past 8 hrs:  BP Temp Temp src Pulse Resp SpO2 Height Weight  09/01/23 0819 (!) 166/81 98.3 F (36.8 C) Oral 68 18 99 % 5\' 9"  (1.753 m) 71.2 kg   No intake/output data recorded. No intake/output data recorded.      General : Alert, cooperative, no distress, appears stated age   Head:  Normocephalic/atraumatic    Eyes: PERRL, conjunctiva/corneas clear, EOM's intact. Fundi could not be visualized Neck: Supple Chest:  Respirations unlabored Chest wall: no tenderness or deformity Heart: Regular rate and rhythm Abdomen: Soft, nontender and nondistended Extremities: warm and well-perfused Skin: normal turgor, color and texture Neurologic:  Alert, oriented x 3.  Eyes open spontaneously. PERRL, EOMI, VFC, no facial droop. V1-3 intact.  No dysarthria, tongue protrusion symmetric.  CNII-XII intact. Normal strength, sensation and reflexes throughout.  No pronator drift, full strength in legs.  + SLR bilaterally. 4/5 bilateral dorsiflexion,  gait not tested       Data ReviewCBC:  Lab Results  Component Value Date   WBC 9.2 08/26/2023   RBC 3.56 (L) 08/26/2023   BMP:  Lab Results  Component Value Date   GLUCOSE 138 (H) 08/26/2023   CO2 22 08/26/2023   BUN 69 (H) 08/26/2023   CREATININE 4.10 (H) 08/26/2023   CREATININE 2.78 (H) 11/27/2022   CALCIUM 9.2 08/26/2023   Radiology review:  See clinic note for details  Assessment:   Active Problems:   * No active hospital problems. * Hx of PCDF, severe lumbar stenosis with instability  Plan:   - plan for L2-3, L3-4, L4-5 DLIFs, posterior perc fusion, possible MIS posterior decompression - Risks, benefits, alternatives, and expected convalescence were discussed  with her.  Risks discussed included, but were not limited to bleeding, pain, infection, scar, spinal fluid leak, neurologic deficit, instability, pseudoarthrosis, damage to nearby organs, and death.  Informed consent was obtained.

## 2023-09-01 NOTE — Anesthesia Procedure Notes (Signed)
Procedure Name: Intubation Date/Time: 09/01/2023 10:52 AM  Performed by: Sharyn Dross, CRNAPre-anesthesia Checklist: Patient identified, Emergency Drugs available, Suction available and Patient being monitored Patient Re-evaluated:Patient Re-evaluated prior to induction Oxygen Delivery Method: Circle system utilized Preoxygenation: Pre-oxygenation with 100% oxygen Induction Type: IV induction Ventilation: Mask ventilation without difficulty Laryngoscope Size: Glidescope and 4 Grade View: Grade I Tube type: Oral Tube size: 7.5 mm Number of attempts: 1 Airway Equipment and Method: Stylet and Oral airway Placement Confirmation: ETT inserted through vocal cords under direct vision, positive ETCO2 and breath sounds checked- equal and bilateral Secured at: 22 cm Tube secured with: Tape Dental Injury: Teeth and Oropharynx as per pre-operative assessment  Comments: Intubation by SRNA C. Holmes under supervision by MD [employer]

## 2023-09-02 ENCOUNTER — Encounter (HOSPITAL_COMMUNITY): Payer: Self-pay | Admitting: Neurosurgery

## 2023-09-02 LAB — CBC
HCT: 27.2 % — ABNORMAL LOW (ref 39.0–52.0)
Hemoglobin: 8.9 g/dL — ABNORMAL LOW (ref 13.0–17.0)
MCH: 29.6 pg (ref 26.0–34.0)
MCHC: 32.7 g/dL (ref 30.0–36.0)
MCV: 90.4 fL (ref 80.0–100.0)
Platelets: 276 10*3/uL (ref 150–400)
RBC: 3.01 MIL/uL — ABNORMAL LOW (ref 4.22–5.81)
RDW: 13.8 % (ref 11.5–15.5)
WBC: 10.7 10*3/uL — ABNORMAL HIGH (ref 4.0–10.5)
nRBC: 0 % (ref 0.0–0.2)

## 2023-09-02 LAB — BASIC METABOLIC PANEL
Anion gap: 15 (ref 5–15)
BUN: 85 mg/dL — ABNORMAL HIGH (ref 8–23)
CO2: 18 mmol/L — ABNORMAL LOW (ref 22–32)
Calcium: 8.3 mg/dL — ABNORMAL LOW (ref 8.9–10.3)
Chloride: 110 mmol/L (ref 98–111)
Creatinine, Ser: 4.84 mg/dL — ABNORMAL HIGH (ref 0.61–1.24)
GFR, Estimated: 13 mL/min — ABNORMAL LOW (ref >60)
Glucose, Bld: 162 mg/dL — ABNORMAL HIGH (ref 70–99)
Potassium: 4.2 mmol/L (ref 3.5–5.1)
Sodium: 143 mmol/L (ref 135–145)

## 2023-09-02 LAB — GLUCOSE, CAPILLARY
Glucose-Capillary: 158 mg/dL — ABNORMAL HIGH (ref 70–99)
Glucose-Capillary: 178 mg/dL — ABNORMAL HIGH (ref 70–99)
Glucose-Capillary: 167 mg/dL — ABNORMAL HIGH (ref 70–99)
Glucose-Capillary: 127 mg/dL — ABNORMAL HIGH (ref 70–99)

## 2023-09-02 MED FILL — Thrombin For Soln 5000 Unit: CUTANEOUS | Qty: 5000 | Status: AC

## 2023-09-02 NOTE — Progress Notes (Signed)
    Providing Compassionate, Quality Care - Together   NEUROSURGERY PROGRESS NOTE     S: No acute events overnight.    O: EXAM:  BP (!) 184/90 (BP Location: Left Arm)   Pulse 91   Temp 98.3 F (36.8 C) (Oral)   Resp 18   Ht 5\' 9"  (1.753 m)   Wt 71.2 kg   SpO2 98%   BMI 23.18 kg/m     Awake, alert, oriented  Speech fluent, appropriate  MAEs, strength grossly intact SILTx4 Dressing c/d/i   ASSESSMENT:  62 y.o. s/p L2-5 DLIF, percutaneous instrumentation, POD#1    PLAN: -Therapies as tolerated -LSO when OOB.  -Cont supportive care -Call w/ questions/concerns.   Patrici Ranks, [facility]

## 2023-09-02 NOTE — Progress Notes (Signed)
Orthopedic Tech Progress Note Patient Details:  GRANDERSON FLADGER 09-20-1961 [id number]  Patient ID: Minette Brine, male   DOB: [birth year], 62 y.o.   MRN: [id number] I went to apply and fit the lso and the patient refused. They said that they didn't want to put it on today due to pain. They said they wanted to wait till tomorrow. I informed the rn. Trinna Post 09/02/2023, 10:05 AM

## 2023-09-02 NOTE — Anesthesia Postprocedure Evaluation (Signed)
Anesthesia Post Note  Patient: Curtis Hartman  Procedure(s) Performed: DIRECT LUMBAR INTERBODY FUSION, LUMBAR TWO-LUMBAR THREE, LUMBAR THREE-LUMBAR FOUR, LUMBAR FOUR-LUMBAR FIVE, RIGHT PRONE TRANSPSOAS, LUMBAR TWO LUMBAR FIVE POSTERIOR PERCUTANEOUS FUSION LUMBAR FOUR-LUMBAR FIVE MINIMALLY INVASIVE LAMINECTOMY, FORAMINOTOMY (Spine Lumbar) L2-5 LUMBAR PERCUTANEOUS PEDICLE SCREW 3 LEVEL Application of O-Arm APPLICATION OF CELL SAVER     Patient location during evaluation: PACU Anesthesia Type: General Level of consciousness: awake and alert Pain management: pain level controlled Vital Signs Assessment: post-procedure vital signs reviewed and stable Respiratory status: spontaneous breathing, nonlabored ventilation, respiratory function stable and patient connected to nasal cannula oxygen Cardiovascular status: blood pressure returned to baseline and stable Postop Assessment: no apparent nausea or vomiting Anesthetic complications: no   No notable events documented.  Last Vitals:  Vitals:   09/02/23 0434 09/02/23 0738  BP: (!) 181/88 (!) 184/90  Pulse:  91  Resp:  18  Temp: 36.8 C 36.8 C  SpO2: 100% 98%    Last Pain:  Vitals:   09/02/23 0738  TempSrc: Oral  PainSc:                  Nelle Don Rashada Klontz

## 2023-09-02 NOTE — Progress Notes (Signed)
OT Cancellation Note  Patient Details Name: Curtis Hartman MRN: [id number] DOB: Jan 22, 1961   Cancelled Treatment:    Reason Eval/Treat Not Completed: (P) Pain limiting ability to participate, Pt seen in AM, adamant about not being able to move OOB or participate in bed mobility, requested to come back tomorrow. Will check on Pt in afternoon to see if pain has improved and reattempt as able.  Alexis Goodell 09/02/2023, 1:03 PM

## 2023-09-02 NOTE — Progress Notes (Addendum)
Pt. Abdomen distended tender to touch c/o pain. Bladder scan greater then 618 MD notified. Verbal phone order for in and out cath once and foley to be place if unable to void after. MD. Esperanza Richters

## 2023-09-02 NOTE — Progress Notes (Signed)
PT Cancellation Note  Patient Details Name: Curtis Hartman MRN: [id number] DOB: [birth year]   Cancelled Treatment:    Reason Eval/Treat Not Completed: Other (comment) (Patient is refusing therapy secondary to pain despite having pain medication and encouragement. He reports he will be willing to try tomorrow if his pain is better. PT will continue with attempts.)  Donna Bernard, PT, MPT  Ina Homes 09/02/2023, 12:21 PM

## 2023-09-03 ENCOUNTER — Inpatient Hospital Stay (HOSPITAL_COMMUNITY): Payer: Medicaid Other

## 2023-09-03 DIAGNOSIS — N179 Acute kidney failure, unspecified: Secondary | ICD-10-CM | POA: Diagnosis not present

## 2023-09-03 DIAGNOSIS — M48061 Spinal stenosis, lumbar region without neurogenic claudication: Principal | ICD-10-CM

## 2023-09-03 DIAGNOSIS — Z794 Long term (current) use of insulin: Secondary | ICD-10-CM

## 2023-09-03 DIAGNOSIS — E119 Type 2 diabetes mellitus without complications: Secondary | ICD-10-CM | POA: Diagnosis not present

## 2023-09-03 LAB — BASIC METABOLIC PANEL
Anion gap: 12 (ref 5–15)
BUN: 75 mg/dL — ABNORMAL HIGH (ref 8–23)
CO2: 18 mmol/L — ABNORMAL LOW (ref 22–32)
Calcium: 8.5 mg/dL — ABNORMAL LOW (ref 8.9–10.3)
Chloride: 110 mmol/L (ref 98–111)
Creatinine, Ser: 4.96 mg/dL — ABNORMAL HIGH (ref 0.61–1.24)
GFR, Estimated: 12 mL/min — ABNORMAL LOW (ref >60)
Glucose, Bld: 186 mg/dL — ABNORMAL HIGH (ref 70–99)
Potassium: 3.7 mmol/L (ref 3.5–5.1)
Sodium: 140 mmol/L (ref 135–145)

## 2023-09-03 LAB — GLUCOSE, CAPILLARY
Glucose-Capillary: 169 mg/dL — ABNORMAL HIGH (ref 70–99)
Glucose-Capillary: 165 mg/dL — ABNORMAL HIGH (ref 70–99)
Glucose-Capillary: 129 mg/dL — ABNORMAL HIGH (ref 70–99)
Glucose-Capillary: 93 mg/dL (ref 70–99)

## 2023-09-03 MED ORDER — HYDRALAZINE HCL 20 MG/ML IJ SOLN: 5.0000 mg | INTRAMUSCULAR | Status: DC | PRN

## 2023-09-03 MED ORDER — TAMSULOSIN HCL 0.4 MG PO CAPS
0.4000 mg | ORAL_CAPSULE | Freq: Every day | ORAL | Status: DC
  Administered 2023-09-03 – 2023-09-11 (×8): 0.4 mg via ORAL
  Filled 2023-09-03 (×9): qty 1

## 2023-09-03 MED ORDER — CHLORHEXIDINE GLUCONATE CLOTH 2 % EX PADS
6.0000 | MEDICATED_PAD | Freq: Every day | CUTANEOUS | Status: DC
  Administered 2023-09-03 – 2023-09-11 (×9): 6 via TOPICAL

## 2023-09-03 MED ORDER — AMLODIPINE BESYLATE 10 MG PO TABS
10.0000 mg | ORAL_TABLET | Freq: Every day | ORAL | Status: DC
  Administered 2023-09-03 – 2023-09-11 (×8): 10 mg via ORAL
  Filled 2023-09-03 (×6): qty 1
  Filled 2023-09-03: qty 2
  Filled 2023-09-03 (×2): qty 1

## 2023-09-03 MED ORDER — CARVEDILOL 3.125 MG PO TABS
3.1250 mg | ORAL_TABLET | Freq: Two times a day (BID) | ORAL | Status: DC
  Administered 2023-09-03 – 2023-09-11 (×16): 3.125 mg via ORAL
  Filled 2023-09-03 (×17): qty 1

## 2023-09-03 MED ORDER — SODIUM BICARBONATE 650 MG PO TABS
650.0000 mg | ORAL_TABLET | Freq: Three times a day (TID) | ORAL | Status: DC
  Administered 2023-09-03 – 2023-09-11 (×24): 650 mg via ORAL
  Filled 2023-09-03 (×25): qty 1

## 2023-09-03 MED FILL — Heparin Sodium (Porcine) Inj 1000 Unit/ML: INTRAMUSCULAR | Qty: 30 | Status: AC

## 2023-09-03 MED FILL — Sodium Chloride IV Soln 0.9%: INTRAVENOUS | Qty: 1000 | Status: AC

## 2023-09-03 NOTE — Evaluation (Signed)
Physical Therapy Evaluation Patient Details Name: Curtis Hartman MRN: [id number] DOB: 09/16/1961 Today's Date: 09/03/2023  History of Present Illness  Pt is a 62 y.o. male s/p DLIF 09/01/23. PMH: s/p ACDF (Jan 2024), OA, HTN  Clinical Impression  Patient is s/p above surgery resulting in the deficits listed below (see PT Problem List). PTA pt lived at home with family, mod I mobility using rollator in community and no AD in house. On eval, he required mod assist bed mobility, min assist sit to stand, and min assist amb 5' with RW. Gait distance limited by pain and weakness. LSO not in room at time of eval. Per ortho tech note, pt refused fitting yesterday. Pt educated on 3/3 back precautions. Patient will benefit from acute skilled PT to increase their independence and safety with mobility (while adhering to their precautions) to allow discharge . He has 2 steps to enter his one-level house. Pt reports family will be able to provide needed level of assist. Pt will need RW for home. PT to follow acutely. No follow up PT services indicated.          If plan is discharge home, recommend the following: A little help with walking and/or transfers;A little help with bathing/dressing/bathroom;Assistance with cooking/housework;Assist for transportation;Help with stairs or ramp for entrance   Can travel by private vehicle        Equipment Recommendations Rolling walker (2 wheels)  Recommendations for Other Services       Functional Status Assessment Patient has had a recent decline in their functional status and demonstrates the ability to make significant improvements in function in a reasonable and predictable amount of time.     Precautions / Restrictions Precautions Precautions: Back Precaution Comments: Pt educated on 3/3 back precautions. Required Braces or Orthoses: Spinal Brace Spinal Brace: Lumbar corset Restrictions Other Position/Activity Restrictions: LSO not in room       Mobility  Bed Mobility Overal bed mobility: Needs Assistance Bed Mobility: Rolling, Sidelying to Sit Rolling: Min assist Sidelying to sit: Mod assist, Used rails       General bed mobility comments: increased time, cues for sequencing, assist with BLE and trunk    Transfers Overall transfer level: Needs assistance Equipment used: Rolling walker (2 wheels) Transfers: Sit to/from Stand Sit to Stand: Min assist           General transfer comment: assist to power up and stabilize balance, cues for hand placement and sequencing    Ambulation/Gait Ambulation/Gait assistance: Min assist Gait Distance (Feet): 5 Feet Assistive device: Rolling walker (2 wheels) Gait Pattern/deviations: Step-through pattern, Decreased stride length       General Gait Details: amb bed to recliner. Distance limited by pain and BLE weakness.  Stairs            Wheelchair Mobility     Tilt Bed    Modified Rankin (Stroke Patients Only)       Balance Overall balance assessment: Needs assistance Sitting-balance support: Bilateral upper extremity supported, Feet supported Sitting balance-Leahy Scale: Fair     Standing balance support: Bilateral upper extremity supported, During functional activity, Reliant on assistive device for balance Standing balance-Leahy Scale: Poor                               Pertinent Vitals/Pain Pain Assessment Pain Assessment: 0-10 Pain Score: 10-Worst pain ever Pain Location: back/BLE Pain Descriptors / Indicators: Grimacing, Guarding, Sore Pain Intervention(s):  Repositioned, Limited activity within patient's tolerance, Premedicated before session    Home Living Family/patient expects to be discharged to:: Private residence Living Arrangements: Other relatives (sister and her boyfriend) Available Help at Discharge: Family;Available 24 hours/day Type of Home: House Home Access: Stairs to enter   [employer] of Steps:  2   Home Layout: One level Home Equipment: Rollator (4 wheels);BSC/3in1;Adaptive equipment;Grab bars - tub/shower;Grab bars - toilet;Shower seat      Prior Function Prior Level of Function : Independent/Modified Independent             Mobility Comments: uses rollator outside, and generally no AD in the home as he furniture walks ADLs Comments: I/mod I     Extremity/Trunk Assessment   Upper Extremity Assessment Upper Extremity Assessment: Defer to OT evaluation    Lower Extremity Assessment Lower Extremity Assessment: Generalized weakness    Cervical / Trunk Assessment Cervical / Trunk Assessment: Back Surgery  Communication   Communication Communication: No apparent difficulties  Cognition Arousal: Alert Behavior During Therapy: Anxious Overall Cognitive Status: Within Functional Limits for tasks assessed                                          General Comments      Exercises     Assessment/Plan    PT Assessment Patient needs continued PT services  PT Problem List Decreased strength;Decreased balance;Decreased knowledge of precautions;Pain;Decreased mobility;Decreased knowledge of use of DME;Decreased activity tolerance       PT Treatment Interventions DME instruction;Functional mobility training;Balance training;Patient/family education;Gait training;Therapeutic activities;Stair training;Therapeutic exercise    PT Goals (Current goals can be found in the Care Plan section)  Acute Rehab PT Goals Patient Stated Goal: home PT Goal Formulation: With patient Time For Goal Achievement: 09/17/23 Potential to Achieve Goals: Good    Frequency Min 1X/week     Co-evaluation               AM-PAC PT "6 Clicks" Mobility  Outcome Measure Help needed turning from your back to your side while in a flat bed without using bedrails?: A Little Help needed moving from lying on your back to sitting on the side of a flat bed without using  bedrails?: A Lot Help needed moving to and from a bed to a chair (including a wheelchair)?: A Little Help needed standing up from a chair using your arms (e.g., wheelchair or bedside chair)?: A Little Help needed to walk in hospital room?: A Little Help needed climbing 3-5 steps with a railing? : A Lot 6 Click Score: 16    End of Session Equipment Utilized During Treatment: Gait belt Activity Tolerance: Patient limited by pain Patient left: in chair;with call bell/phone within reach;with chair alarm set Nurse Communication: Mobility status PT Visit Diagnosis: Other abnormalities of gait and mobility (R26.89);Muscle weakness (generalized) (M62.81);Pain    Time: 0735-0759 PT Time Calculation (min) (ACUTE ONLY): 24 min   Charges:   PT Evaluation $PT Eval Moderate Complexity: 1 Mod PT Treatments $Therapeutic Activity: 8-22 mins PT General Charges $$ ACUTE PT VISIT: 1 Visit         Ferd Glassing., PT  Office # [phone]   Ilda Foil 09/03/2023, 8:08 AM

## 2023-09-03 NOTE — Consult Note (Signed)
[facility] Renal Consultation Note  Requesting MD: Elgergawy, MD Indication for Consultation: AKI on CKD stage IV  HPI:  Curtis Hartman is a 62 y.o. 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  On chart review I see that he follows with Dr. Wolfgang Phoenix. He has known baseline proteinuria 2/2 T2DM, FSGS, HTN, and has had renal biopsy November 2022. He previously trialed SGLT2 but did not tolerate due to significant weight loss from diarrhea. He has had gradual decrease in renal function over the last two years, and from what I am able to see in the last year he has had serum creatinine in the mid-2's with GFR in the upper 20's. He had an appointment as recently as last week and states that his lab work showed further progression of his kidney disease, and that it was mentioned to him that it may be worthwhile to see if he can have an AV fistula created while he is admitted this hospitalization as he is progressing further to the need for hemodialysis. He thinks that his most recent GFR in the last few weeks was around 10, and about two months ago it was around 16.  He does report several days of poor PO intake, appetite, and nausea but states that this has been while he has been hospitalized. He has also felt generally weak and fatigued. He does not know if he was taking olmesartan prior to his procedure and has two knew medications with unknown names at home based on his last OP nephrology visit. He had been receiving irbesartan while hospitalized, discontinued this morning. Prior to this admission he has not had a decrease in urine production/output at home, no issues with appetite, nausea,  vomiting, confusion.  Labs Reviewed: CO2 18  BUN 75/Cr 4.96 (85/4.84) Ca 8.5 GFR 12  I's and O's: Net +363 mL, UOP 1.2L thus far today  Past Medical History: Past Medical History:  Diagnosis Date   Arthritis    CKD (chronic kidney disease)    COPD (chronic obstructive pulmonary disease) (HCC)    Diabetes mellitus without complication (HCC)    type 2   GERD (gastroesophageal reflux disease)    Headache    Hx Migraines   Hepatitis    Hypertension    Neuropathy    Past Surgical History: Past Surgical History:  Procedure Laterality Date   ANTERIOR CERVICAL DECOMP/DISCECTOMY FUSION N/A 11/05/2022   Procedure: Anterior Cervical Decompression/discectomy Fusion Cervical Four-Cervical Five;  Surgeon: Bedelia Person, MD;  Location: [facility] OR;  Service: Neurosurgery;  Laterality: N/A;  3C   ANTERIOR LAT LUMBAR FUSION N/A 09/01/2023   Procedure: DIRECT LUMBAR INTERBODY FUSION, LUMBAR TWO-LUMBAR THREE, LUMBAR THREE-LUMBAR FOUR, LUMBAR FOUR-LUMBAR FIVE, RIGHT PRONE TRANSPSOAS, LUMBAR TWO LUMBAR FIVE POSTERIOR PERCUTANEOUS FUSION LUMBAR FOUR-LUMBAR FIVE MINIMALLY INVASIVE LAMINECTOMY, FORAMINOTOMY;  Surgeon: Bedelia Person, MD;  Location: [facility] OR;  Service: Neurosurgery;  Laterality: N/A;   BACK SURGERY  2023   Cervical Spine   BIOPSY  03/03/2022   Procedure: BIOPSY;  Surgeon: Lanelle Bal, DO;  Location: [facility];  Service: Endoscopy;;   COLONOSCOPY WITH PROPOFOL N/A 03/03/2022   Procedure: COLONOSCOPY WITH PROPOFOL;  Surgeon: Lanelle Bal, DO;  Location: [facility];  Service: Endoscopy;  Laterality: N/A;  8:00am   ESOPHAGOGASTRODUODENOSCOPY (EGD) WITH PROPOFOL N/A 03/03/2022   Procedure: ESOPHAGOGASTRODUODENOSCOPY (EGD) WITH PROPOFOL;  Surgeon: Lanelle Bal, DO;  Location: [facility];  Service: Endoscopy;  Laterality: N/A;   foot right partial toe amputation Right    FOOT SURGERY Right    x3   hernia surgery x4     LUMBAR PERCUTANEOUS PEDICLE SCREW 3 LEVEL  N/A 09/01/2023   Procedure: L2-5 LUMBAR PERCUTANEOUS PEDICLE SCREW 3 LEVEL;  Surgeon: Bedelia Person, MD;  Location: [facility] OR;  Service: Neurosurgery;  Laterality: N/A;   POLYPECTOMY  03/03/2022   Procedure: POLYPECTOMY;  Surgeon: Lanelle Bal, DO;  Location: [facility];  Service: Endoscopy;;    Family History:  Family History  Problem Relation Age of Onset   Heart disease Mother    Seizures Father    Stroke Maternal Grandfather    Liver disease Paternal Grandmother     Social History:  reports that he has been smoking cigarettes. He has never been exposed to tobacco smoke. He has never used smokeless tobacco. He reports that he does not currently use alcohol. He reports that he does not currently use drugs.  Allergies: No Active Allergies  Medications: Prior to Admission medications   Medication Sig Start Date End Date Taking? Authorizing Provider  amLODipine (NORVASC) 10 MG tablet Take 10 mg by mouth daily as needed (diastolic blood pressure above 90).   Yes [provider]  calcitRIOL (ROCALTROL) 0.25 MCG capsule Take 0.25 mcg by mouth 3 (three) times a week. 07/25/23  Yes [provider]  calcium acetate (PHOSLO) 667 MG tablet Take 667 mg by mouth 2 (two) times daily. 07/25/23  Yes [provider]  fluticasone (FLONASE) 50 MCG/ACT nasal spray Place 2 sprays into both nostrils daily as needed for allergies (congestion). 09/12/22 09/12/23 Yes [provider]  gabapentin (NEURONTIN) 400 MG capsule Take 400 mg by mouth 2 (two) times daily.   Yes [provider]  insulin aspart (NOVOLOG) 100 UNIT/ML injection Inject 2-10 Units into the skin 3 (three) times daily as needed (blood sugar over 150).   Yes [provider]  LANTUS 100 UNIT/ML injection Inject 5 Units into the skin at bedtime as needed (blood sugar over 150). 03/05/21  Yes [provider]  morphine (MSIR) 15 MG tablet Take 15 mg by mouth every 6 (six) hours as  needed for severe pain.   Yes [provider]  olmesartan (BENICAR) 5 MG tablet Take 5 mg by mouth daily as needed (diastolic blood pressure above 90).   Yes [provider]  Wheat Dextrin (BENEFIBER) POWD Mix 2 - 3 teaspoons in 8 ounces of water daily in the mornings. 04/21/23  Yes Aida Raider, NP  albuterol (PROAIR HFA) 108 (90 Base) MCG/ACT inhaler 2 puffs every 4 hours as needed only  if your can't catch your breath 06/10/23   Nyoka Cowden, MD  azithromycin (ZITHROMAX) 250 MG tablet Take 2 on day one then 1 daily x 4 days Patient not taking: Reported on 08/19/2023 06/10/23   Nyoka Cowden, MD  Budeson-Glycopyrrol-Formoterol (BREZTRI AEROSPHERE) 160-9-4.8 MCG/ACT AERO Inhale 2 puffs into the lungs in the morning and at bedtime. Patient taking differently: Inhale 2 puffs into the lungs 2 (two) times daily as needed (shortness of breath). 06/10/23   Nyoka Cowden, MD  LOKELMA 10 g PACK packet Take 10 g by mouth daily as needed (high potassium). 03/18/23   [provider]  I have reviewed the patient's current medications.  Labs:  Results for orders placed or performed during the hospital encounter of 09/01/23 (from the past 48 hour(s))  Glucose, capillary     Status: Abnormal   Collection Time: 09/01/23  4:30 PM  Result Value Ref Range   Glucose-Capillary 168 (H) 70 - 99 mg/dL    Comment: Glucose reference range applies only to samples taken after fasting for at least 8 hours.  Glucose, capillary     Status: Abnormal   Collection Time: 09/01/23 10:53 PM  Result Value Ref Range   Glucose-Capillary 136 (H) 70 - 99 mg/dL    Comment: Glucose reference range applies only to samples taken after fasting for at least 8 hours.  Glucose, capillary     Status: Abnormal   Collection Time: 09/02/23  7:36 AM  Result Value Ref Range   Glucose-Capillary 158 (H) 70 - 99 mg/dL    Comment: Glucose reference range applies only to samples taken after fasting for at least 8  hours.  Basic metabolic panel     Status: Abnormal   Collection Time: 09/02/23  8:39 AM  Result Value Ref Range   Sodium 143 135 - 145 mmol/L   Potassium 4.2 3.5 - 5.1 mmol/L   Chloride 110 98 - 111 mmol/L   CO2 18 (L) 22 - 32 mmol/L   Glucose, Bld 162 (H) 70 - 99 mg/dL    Comment: Glucose reference range applies only to samples taken after fasting for at least 8 hours.   BUN 85 (H) 8 - 23 mg/dL   Creatinine, Ser 8.29 (H) 0.61 - 1.24 mg/dL   Calcium 8.3 (L) 8.9 - 10.3 mg/dL   GFR, Estimated 13 (L) >60 mL/min    Comment: (NOTE) Calculated using the CKD-EPI Creatinine Equation (2021)    Anion gap 15 5 - 15    Comment: Performed at [facility], [address] [place], [place] [address]  CBC     Status: Abnormal   Collection Time: 09/02/23  8:39 AM  Result Value Ref Range   WBC 10.7 (H) 4.0 - 10.5 K/uL   RBC 3.01 (L) 4.22 - 5.81 MIL/uL   Hemoglobin 8.9 (L) 13.0 - 17.0 g/dL   HCT 08.6 (L) 57.8 - 46.9 %   MCV 90.4 80.0 - 100.0 fL   MCH 29.6 26.0 - 34.0 pg   MCHC 32.7 30.0 - 36.0 g/dL   RDW 62.9 52.8 - 41.3 %   Platelets 276 150 - 400 K/uL   nRBC 0.0 0.0 - 0.2 %    Comment: Performed at [facility], [address] [place], [place] [address]  Glucose, capillary     Status: Abnormal   Collection Time: 09/02/23 11:38 AM  Result Value Ref Range   Glucose-Capillary 178 (H) 70 - 99 mg/dL    Comment: Glucose reference range applies only to samples taken after fasting for at least 8 hours.  Glucose, capillary     Status: Abnormal   Collection Time: 09/02/23  4:30 PM  Result Value Ref Range   Glucose-Capillary 167 (H) 70 - 99 mg/dL    Comment: Glucose reference range applies only to samples taken after fasting for at least 8 hours.  Glucose, capillary     Status: Abnormal   Collection Time: 09/02/23  9:07 PM  Result Value Ref Range   Glucose-Capillary 127 (H) 70 - 99 mg/dL    Comment: Glucose reference range applies only to samples taken  after fasting for at least 8  hours.  Glucose, capillary     Status: Abnormal   Collection Time: 09/03/23  7:18 AM  Result Value Ref Range   Glucose-Capillary 169 (H) 70 - 99 mg/dL    Comment: Glucose reference range applies only to samples taken after fasting for at least 8 hours.  Basic metabolic panel     Status: Abnormal   Collection Time: 09/03/23  8:37 AM  Result Value Ref Range   Sodium 140 135 - 145 mmol/L   Potassium 3.7 3.5 - 5.1 mmol/L   Chloride 110 98 - 111 mmol/L   CO2 18 (L) 22 - 32 mmol/L   Glucose, Bld 186 (H) 70 - 99 mg/dL    Comment: Glucose reference range applies only to samples taken after fasting for at least 8 hours.   BUN 75 (H) 8 - 23 mg/dL   Creatinine, Ser 6.21 (H) 0.61 - 1.24 mg/dL   Calcium 8.5 (L) 8.9 - 10.3 mg/dL   GFR, Estimated 12 (L) >60 mL/min    Comment: (NOTE) Calculated using the CKD-EPI Creatinine Equation (2021)    Anion gap 12 5 - 15    Comment: Performed at [facility], [address], [place], [place] [address]  Glucose, capillary     Status: Abnormal   Collection Time: 09/03/23 12:00 PM  Result Value Ref Range   Glucose-Capillary 165 (H) 70 - 99 mg/dL    Comment: Glucose reference range applies only to samples taken after fasting for at least 8 hours.    ROS: Pertinent items are noted in HPI.  Physical Exam: Vitals:   09/03/23 1200 09/03/23 1219  BP: 137/71 126/65  Pulse: 86 86  Resp:    Temp: (!) 97.3 F (36.3 C)   SpO2: 100% 98%     Constitutional:Chronically ill appearing gentleman sleeping peacefully, wakes easily. In no acute distress. Cardio:Regular rate and rhythm. No murmurs, rubs, or gallops. Pulm:Clear to auscultation bilaterally. Normal work of breathing on room air. Abdomen:Soft, nontender, nondistended. HQ:IONGE catheter in place with clear urine in collection bag. XBM:[id number] for extremity edema. Skin:Warm and dry. Neuro:Alert and oriented x3. No focal deficit noted. Psych:Pleasant mood and affect.  Assessment/Plan: AKI on  CKD stage IV: Unclear what current baseline is but based on his explanation of recent OP nephrology visit and lab results, I suspect it may not be significantly far off from current levels. Pre-operative labs showed BUN 69/Cr 4.10 and GFR 16. An unknown degree of AKI is likely present in the setting of reported poor PO intake, complicated further by contrast administration during procedure 10/29, blood loss intraoperatively/hypovolemia, and ongoing ARB use. Is making urine, currently has foley catheter in place due to urinary retention overnight. Does not seem to be uremic at this time. Avoid nephrotoxic agents, renally adjust medications Recommend decreased dose of gabapentin, 600 mg/day maximum with current renal function Would advise against oxycodone use at this time and use caution with robaxin Would continue to hold ARB, consider discontinuing at discharge Supportive care for now, encourage PO intake Follow up urine studies Follow up final read of renal ultrasound Trend renal function Maintain foley catheter; agree with flomax for urinary retention No indication for emergent HD Consult VVS for vein mapping/OP arrangement for AV fistula creation in AM HTN/Volume: Normotensive at this time though has been hypertensive this admission. Now on amlodipine, carvedilol, and has PRNs ordered. Per primary. Anemia of chronic kidney disease: Hgb acutely decreased from baseline s/p surgery. Normocytic.  Trend CBC.  Secondary hyperparathyroidism/CMD/Hyperphosphatemia: OP calcitriol resumed while hospitalized. Additionally receiving phoslo BID. Metabolic acidosis: Chronic. OP sodium bicarbonate has been resumed while admitted. Diabetes: Well-controlled. Per primary. Lumbar stenosis: POD2 from lumbar spine fusion. Per neurosurgery.     Champ Mungo, DO Internal Medicine PGY-3 09/03/2023, 1:13 PM

## 2023-09-03 NOTE — Consult Note (Signed)
Patient Demographics  Curtis Hartman, is a 62 y.o. male   MRN: [id number]   DOB - 06-02-1961  Admit Date - 09/01/2023    Outpatient Primary MD for the patient is Wilmon Pali, FNP  Consult requested in the Hospital by Bedelia Person, MD, On 09/03/2023    Reason for consult AKI on CKD   With History of -  Past Medical History:  Diagnosis Date   Arthritis    CKD (chronic kidney disease)    COPD (chronic obstructive pulmonary disease) (HCC)    Diabetes mellitus without complication (HCC)    type 2   GERD (gastroesophageal reflux disease)    Headache    Hx Migraines   Hepatitis    Hypertension    Neuropathy       Past Surgical History:  Procedure Laterality Date   ANTERIOR CERVICAL DECOMP/DISCECTOMY FUSION N/A 11/05/2022   Procedure: Anterior Cervical Decompression/discectomy Fusion Cervical Four-Cervical Five;  Surgeon: Bedelia Person, MD;  Location: [facility] OR;  Service: Neurosurgery;  Laterality: N/A;  3C   ANTERIOR LAT LUMBAR FUSION N/A 09/01/2023   Procedure: DIRECT LUMBAR INTERBODY FUSION, LUMBAR TWO-LUMBAR THREE, LUMBAR THREE-LUMBAR FOUR, LUMBAR FOUR-LUMBAR FIVE, RIGHT PRONE TRANSPSOAS, LUMBAR TWO LUMBAR FIVE POSTERIOR PERCUTANEOUS FUSION LUMBAR FOUR-LUMBAR FIVE MINIMALLY INVASIVE LAMINECTOMY, FORAMINOTOMY;  Surgeon: Bedelia Person, MD;  Location: [facility] OR;  Service: Neurosurgery;  Laterality: N/A;   BACK SURGERY  2023   Cervical Spine   BIOPSY  03/03/2022   Procedure: BIOPSY;  Surgeon: Lanelle Bal, DO;  Location: [facility] SUITE;  Service: Endoscopy;;   COLONOSCOPY WITH PROPOFOL N/A 03/03/2022   Procedure: COLONOSCOPY WITH PROPOFOL;  Surgeon: Lanelle Bal, DO;  Location: [facility];  Service: Endoscopy;  Laterality: N/A;  8:00am   ESOPHAGOGASTRODUODENOSCOPY (EGD) WITH PROPOFOL N/A 03/03/2022   Procedure: ESOPHAGOGASTRODUODENOSCOPY (EGD) WITH PROPOFOL;  Surgeon:  Lanelle Bal, DO;  Location: [facility];  Service: Endoscopy;  Laterality: N/A;   foot right partial toe amputation Right    FOOT SURGERY Right    x3   hernia surgery x4     LUMBAR PERCUTANEOUS PEDICLE SCREW 3 LEVEL N/A 09/01/2023   Procedure: L2-5 LUMBAR PERCUTANEOUS PEDICLE SCREW 3 LEVEL;  Surgeon: Bedelia Person, MD;  Location: [facility] OR;  Service: Neurosurgery;  Laterality: N/A;   POLYPECTOMY  03/03/2022   Procedure: POLYPECTOMY;  Surgeon: Lanelle Bal, DO;  Location: [facility];  Service: Endoscopy;;    in for   No chief complaint on file.    HPI  Curtis Hartman  is a 62 y.o. male, past medical history of hypertension, CKD, diabetes mellitus, who is following with nephrology,  Dr. Wolfgang Phoenix, patient was admitted by neurosurgery 09/01/2023 for L2-3, L3-4, L4-5, interbody fusion patient creatinine was noted to be elevated, baseline around 3.6, was noted to be elevated at 4.8, as well patient had evidence of urinary retention overnight requiring Foley catheter insertion, so [facility] hospitalist were requested for medical management given worsening renal failure, patient  following with Dr. 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 -Reports poor appetite and nausea over the last couple days since admission he thinks is most likely related to him being on clear liquid diet, he denies chest pain, shortness of breath, weight gain, he reports generalized weakness and fatigue. -Patient creatinine on admission was 4.84, trending up to 4.96 today, it was 4.10 on preop lab on 10/23, most recent lab as an outpatient in  August was 3.65.   Review of Systems     A full 10 point Review of Systems was done, except as stated above, all other Review of Systems were negative.   Social History Social History   Tobacco Use   Smoking status: Every Day    Current packs/day: 0.25    Types: Cigarettes    Passive exposure: Never   Smokeless tobacco: Never   Tobacco comments:    Pt smokes a couple cigarettes a day (as of 08/26/23)  Substance Use Topics   Alcohol use: Not Currently     Family History Family History  Problem Relation Age of Onset   Heart disease Mother    Seizures Father    Stroke Maternal Grandfather    Liver disease Paternal Grandmother     Prior to Admission medications   Medication Sig Start Date End Date Taking? Authorizing Provider  amLODipine (NORVASC) 10 MG tablet Take 10 mg by mouth daily as needed (diastolic blood pressure above 90).   Yes [provider]  calcitRIOL (ROCALTROL) 0.25 MCG capsule Take 0.25 mcg by mouth 3 (three) times a week. 07/25/23  Yes [provider]  calcium acetate (PHOSLO) 667 MG tablet Take 667 mg by mouth 2 (two) times daily. 07/25/23  Yes [provider]  fluticasone (FLONASE) 50 MCG/ACT nasal spray Place 2 sprays into both nostrils daily as needed for allergies (congestion). 09/12/22 09/12/23 Yes [provider]  gabapentin (NEURONTIN) 400 MG capsule Take 400 mg by mouth 2 (two) times daily.   Yes [provider]  insulin aspart (NOVOLOG) 100 UNIT/ML injection Inject 2-10 Units into the skin 3 (three) times daily as needed (blood sugar over 150).   Yes [provider]  LANTUS 100 UNIT/ML injection Inject 5 Units into the skin at bedtime as needed (blood sugar over 150). 03/05/21  Yes [provider]  morphine (MSIR) 15 MG tablet Take 15 mg by mouth every 6 (six) hours as needed for severe pain.   Yes [provider]  olmesartan (BENICAR) 5 MG tablet Take 5 mg by mouth daily as needed  (diastolic blood pressure above 90).   Yes [provider]  Wheat Dextrin (BENEFIBER) POWD Mix 2 - 3 teaspoons in 8 ounces of water daily in the mornings. 04/21/23  Yes Aida Raider, NP  albuterol (PROAIR HFA) 108 (90 Base) MCG/ACT inhaler 2 puffs every 4 hours as needed only  if your can't catch your breath 06/10/23   Nyoka Cowden, MD  azithromycin (ZITHROMAX) 250 MG tablet Take 2 on day one then 1 daily x 4 days Patient not taking: Reported on 08/19/2023 06/10/23   Nyoka Cowden, MD  Budeson-Glycopyrrol-Formoterol (BREZTRI AEROSPHERE) 160-9-4.8 MCG/ACT AERO  Inhale 2 puffs into the lungs in the morning and at bedtime. Patient taking differently: Inhale 2 puffs into the lungs 2 (two) times daily as needed (shortness of breath). 06/10/23   Nyoka Cowden, MD  LOKELMA 10 g PACK packet Take 10 g by mouth daily as needed (high potassium). 03/18/23   [provider]    Anti-infectives (From admission, onward)    Start     Dose/Rate Route Frequency Ordered Stop   09/01/23 2030  ceFAZolin (ANCEF) IVPB 2g/100 mL premix        2 g 200 mL/hr over 30 Minutes Intravenous Every 8 hours 09/01/23 1934 09/03/23 0800   09/01/23 0830  ceFAZolin (ANCEF) IVPB 2g/100 mL premix        2 g 200 mL/hr over 30 Minutes Intravenous On call to O.R. 09/01/23 0814 09/01/23 1132       Scheduled Meds:  amLODipine  10 mg Oral Daily   calcitRIOL  0.25 mcg Oral Once per day on Monday Wednesday Friday   calcium acetate  667 mg Oral BID WC   docusate sodium  100 mg Oral BID   fluticasone furoate-vilanterol  1 puff Inhalation Daily   And   umeclidinium bromide  1 puff Inhalation Daily   gabapentin  400 mg Oral BID   insulin aspart  0-15 Units Subcutaneous TID WC   insulin aspart  0-5 Units Subcutaneous QHS   sodium chloride flush  3 mL Intravenous Q12H   tamsulosin  0.4 mg Oral Daily   Continuous Infusions: PRN Meds:.acetaminophen **OR** acetaminophen, albuterol, amLODipine, hydrALAZINE,  HYDROmorphone (DILAUDID) injection, labetalol, menthol-cetylpyridinium **OR** phenol, methocarbamol **OR** methocarbamol (ROBAXIN) injection, ondansetron **OR** ondansetron (ZOFRAN) IV, oxyCODONE, oxyCODONE, polyethylene glycol, sodium chloride flush, sodium phosphate  No Active Allergies  Physical Exam  Vitals  Blood pressure (!) 172/79, pulse 86, temperature 98.5 F (36.9 C), temperature source Oral, resp. rate 18, height 5\' 9"  (1.753 m), weight 71.2 kg, SpO2 99%.   1. General Well, elderly male, laying in bed, no apparent distress  2. Normal affect and insight, Not Suicidal or Homicidal, Awake Alert, Oriented X 3.  3. No F.N deficits, ALL C.Nerves Intact, Strength 5/5 all 4 extremities, Sensation intact all 4 extremities, Plantars down going.  4. Ears and Eyes appear Normal, Conjunctivae clear, PERRLA. Moist Oral Mucosa.  5. Supple Neck, No JVD, No cervical lymphadenopathy appriciated, No Carotid Bruits.  6. Symmetrical Chest wall movement, Good air movement bilaterally, CTAB.  7. RRR, No Gallops, Rubs or Murmurs, No Parasternal Heave.  8. Positive Bowel Sounds, Abdomen Soft, No tenderness, No organomegaly appriciated,No rebound -guarding or rigidity.  9.  No Cyanosis, Normal Skin Turgor, No Skin Rash or Bruise.  10. Good muscle tone,  joints appear normal , no effusions, Normal ROM.  \  Data Review  CBC Recent Labs  Lab 09/02/23 0839  WBC 10.7*  HGB 8.9*  HCT 27.2*  PLT 276  MCV 90.4  MCH 29.6  MCHC 32.7  RDW 13.8   ------------------------------------------------------------------------------------------------------------------  Chemistries  Recent Labs  Lab 09/02/23 0839  NA 143  K 4.2  CL 110  CO2 18*  GLUCOSE 162*  BUN 85*  CREATININE 4.84*  CALCIUM 8.3*   ------------------------------------------------------------------------------------------------------------------ estimated creatinine clearance is 15.8 mL/min (A) (by C-G formula based on  SCr of 4.84 mg/dL (H)). ------------------------------------------------------------------------------------------------------------------ No results for input(s): "TSH", "T4TOTAL", "T3FREE", "THYROIDAB" in the last 72 hours.  Invalid input(s): "FREET3"   Coagulation profile No results for input(s): "INR", "PROTIME" in the last  168 hours. ------------------------------------------------------------------------------------------------------------------- No results for input(s): "DDIMER" in the last 72 hours. -------------------------------------------------------------------------------------------------------------------  Cardiac Enzymes No results for input(s): "CKMB", "TROPONINI", "MYOGLOBIN" in the last 168 hours.  Invalid input(s): "CK" ------------------------------------------------------------------------------------------------------------------ Invalid input(s): "POCBNP"   ---------------------------------------------------------------------------------------------------------------  Urinalysis No results found for: "COLORURINE", "APPEARANCEUR", "LABSPEC", "PHURINE", "GLUCOSEU", "HGBUR", "BILIRUBINUR", "KETONESUR", "PROTEINUR", "UROBILINOGEN", "NITRITE", "LEUKOCYTESUR"   Imaging results:   DG Lumbar Spine 2-3 Views  Result Date: 09/01/2023 CLINICAL DATA:  Elective surgery. EXAM: LUMBAR SPINE - 2-3 VIEW COMPARISON:  None Available. FINDINGS: Three fluoroscopic spot views of the lumbar spine obtained in the operating room. Posterior rod with intrapedicular screw fusion L2 through L5 with interbody spacers. Fluoroscopy time 3 minutes 20 seconds. Dose 136.12 mGy. IMPRESSION: Intraoperative fluoroscopy during lumbar fusion. Electronically Signed   By: Narda Rutherford M.D.   On: 09/01/2023 18:22   DG C-Arm 1-60 Min-No Report  Result Date: 09/01/2023 Fluoroscopy was utilized by the requesting physician.  No radiographic interpretation.   DG C-Arm 1-60 Min-No Report  Result  Date: 09/01/2023 Fluoroscopy was utilized by the requesting physician.  No radiographic interpretation.   DG C-Arm 1-60 Min-No Report  Result Date: 09/01/2023 Fluoroscopy was utilized by the requesting physician.  No radiographic interpretation.   DG C-Arm 1-60 Min-No Report  Result Date: 09/01/2023 Fluoroscopy was utilized by the requesting physician.  No radiographic interpretation.   DG C-Arm 1-60 Min-No Report  Result Date: 09/01/2023 Fluoroscopy was utilized by the requesting physician.  No radiographic interpretation.   DG O-ARM IMAGE ONLY/NO REPORT  Result Date: 09/01/2023 There is no Radiologist interpretation  for this exam.       Assessment & Plan  Principal Problem:   Lumbar spinal stenosis    AKI on CKD stage IV -Patient appears to be in progressive worsening of renal failure, with known underlying proteinuria, CKD stage IV in the setting of hypertension, FSGS, and diabetes mellitus -Does appear to be a euvolemic, I will keep on gentle hydration, in the setting of poor appetite and oral intake -Avoid nephrotoxic medications, hold on NSAIDs, I will discontinue his losartan for now-will obtain renal ultrasound, urine sodium, urine analysis (already known to be on proteinuria range) -BUN at 75, she is around his baseline, I do not think he is having any uremic symptoms, nausea and poor oral intake most likely in the setting of his clear liquid diet. -He is with urinary retention noted as well overnight, 900 cc of immediate urine output after Foley catheter insertion is contributing, so with hope renal function will start to improve after Foley insertion -Will consult renal.  Uncontrolled hypertension -Blood pressure appears to be poorly controlled, I will change his Norvasc to scheduled, will add low-dose Coreg and as needed hydralazine  Urinary retention -Postoperatively, Foley catheter inserted this morning with immediate 900 cc urine output, will start on  Flomax, voiding trial when stable.  Diabetes mellitus -He is on 5 units Lantus at home, his CBG appears to be controlled on sliding scale, which we will continue for now and if it started to increase then we will start on home dose Lantus.   Lumbar stenosis -This post percutaneous instrumentation, postop day #2, management per primary neurosurgical team -Would avoid morphine in the setting of worsening renal failure, he is on oxycodone, Dilaudid -Decreasing gabapentin dose if he becomes encephalopathic.    DVT Prophylaxis : per primary team  AM Labs Ordered, also please review Full Orders   Thank you for the consult, we will follow the patient with  you in the Hospital.   Huey Bienenstock M.D on 09/03/2023 at 9:51 AM   Thank you for the consult, we will follow the patient with you in the Hospital.   [facility]

## 2023-09-03 NOTE — Evaluation (Signed)
Occupational Therapy Evaluation Patient Details Name: Curtis Hartman MRN: [id number] DOB: 10-20-1961 Today's Date: 09/03/2023   History of Present Illness Pt is a 62 y.o. male s/p DLIF 09/01/23. PMH: s/p ACDF (Jan 2024), OA, HTN   Clinical Impression   PTA patient reports independent with ADLs, mobility using rollator and light IADLs. Admitted for above and presents with problem list below.  He completes transfers with min assist using RW, requires setup to max assist for ADLs, and completes bed mobility with mod assist.  He reports his sister will be able to assist some at home, but not 24/7.  Pt will need to progress mobility and independence prior to dc home, limited today due to pain in back.  Based on performance today, pt will benefit from further OT services acutely and after dc at [facility] level (pending progress) to optimize independence, safety and return to PLOF.  Will follow.       If plan is discharge home, recommend the following: A little help with walking and/or transfers;A lot of help with bathing/dressing/bathroom;Assistance with cooking/housework;Assist for transportation    Functional Status Assessment  Patient has had a recent decline in their functional status and demonstrates the ability to make significant improvements in function in a reasonable and predictable amount of time.  Equipment Recommendations  None recommended by OT    Recommendations for Other Services       Precautions / Restrictions Precautions Precautions: Back Precaution Booklet Issued: Yes (comment) Precaution Comments: Pt educated on 3/3 back precautions. Required Braces or Orthoses: Spinal Brace Spinal Brace: Lumbar corset Restrictions Weight Bearing Restrictions: No Other Position/Activity Restrictions: LSO not in room      Mobility Bed Mobility Overal bed mobility: Needs Assistance Bed Mobility: Rolling, Sit to Sidelying Rolling: Min assist       Sit to sidelying: Mod assist,  Used rails General bed mobility comments: increased time, cueing for technique for log roll and assist for trunk/LEs    Transfers Overall transfer level: Needs assistance Equipment used: Rolling walker (2 wheels) Transfers: Sit to/from Stand Sit to Stand: Min assist           General transfer comment: cueing for hand placement, assist to power up and steady.      Balance Overall balance assessment: Needs assistance Sitting-balance support: Single extremity supported, Bilateral upper extremity supported, Feet supported Sitting balance-Leahy Scale: Fair Sitting balance - Comments: relies on at least 1 UE support   Standing balance support: Bilateral upper extremity supported, During functional activity Standing balance-Leahy Scale: Poor Standing balance comment: relies on BUE and external support                           ADL either performed or assessed with clinical judgement   ADL Overall ADL's : Needs assistance/impaired     Grooming: Set up;Sitting           Upper Body Dressing : Sitting;Minimal assistance Upper Body Dressing Details (indicate cue type and reason): pt relies on at least 1 UE support sitting unsupported Lower Body Dressing: Maximal assistance;Sit to/from stand Lower Body Dressing Details (indicate cue type and reason): unable to complete figure 4 technique Toilet Transfer: Minimal assistance;Ambulation Toilet Transfer Details (indicate cue type and reason): recliner to EOB         Functional mobility during ADLs: Minimal assistance;Rolling walker (2 wheels);Cueing for sequencing;Cueing for safety       Vision   Vision Assessment?: No apparent visual  deficits     Perception         Praxis         Pertinent Vitals/Pain Pain Assessment Pain Assessment: Faces Faces Pain Scale: Hurts whole lot Pain Location: back/BLE Pain Descriptors / Indicators: Grimacing, Guarding, Sore Pain Intervention(s): Limited activity within  patient's tolerance, Monitored during session, Repositioned, RN gave pain meds during session     Extremity/Trunk Assessment Upper Extremity Assessment Upper Extremity Assessment: Right hand dominant;Generalized weakness;LUE deficits/detail LUE Deficits / Details: limited shoulder flexion to 15-20 degress due to arthritis   Lower Extremity Assessment Lower Extremity Assessment: Defer to PT evaluation   Cervical / Trunk Assessment Cervical / Trunk Assessment: Back Surgery   Communication Communication Communication: No apparent difficulties   Cognition Arousal: Alert Behavior During Therapy: Anxious Overall Cognitive Status: Within Functional Limits for tasks assessed                                       General Comments       Exercises     Shoulder Instructions      Home Living Family/patient expects to be discharged to:: Private residence Living Arrangements: Other relatives (sister and her boyfriend) Available Help at Discharge: Family;Available 24 hours/day Type of Home: House Home Access: Stairs to enter [employer] of Steps: 2   Home Layout: One level     Bathroom Shower/Tub: Chief Strategy Officer: Standard     Home Equipment: Rollator (4 wheels);BSC/3in1;Adaptive equipment;Shower seat;Tub bench Adaptive Equipment: Reacher        Prior Functioning/Environment Prior Level of Function : Independent/Modified Independent             Mobility Comments: uses rollator outside, and generally no AD in the home as he furniture walks ADLs Comments: pt managing ADLs and light IADls        OT Problem List: Decreased strength;Decreased activity tolerance;Impaired balance (sitting and/or standing);Pain;Decreased knowledge of precautions;Decreased knowledge of use of DME or AE      OT Treatment/Interventions: Therapeutic exercise;Self-care/ADL training;DME and/or AE instruction;Therapeutic activities;Patient/family  education;Balance training    OT Goals(Current goals can be found in the care plan section) Acute Rehab OT Goals Patient Stated Goal: get better OT Goal Formulation: With patient Time For Goal Achievement: 09/17/23 Potential to Achieve Goals: Good  OT Frequency: Min 1X/week    Co-evaluation              AM-PAC OT "6 Clicks" Daily Activity     Outcome Measure Help from another person eating meals?: A Little Help from another person taking care of personal grooming?: A Little Help from another person toileting, which includes using toliet, bedpan, or urinal?: A Lot Help from another person bathing (including washing, rinsing, drying)?: A Lot Help from another person to put on and taking off regular upper body clothing?: A Little Help from another person to put on and taking off regular lower body clothing?: A Lot 6 Click Score: 15   End of Session Equipment Utilized During Treatment: Gait belt;Rolling walker (2 wheels) Nurse Communication: Mobility status  Activity Tolerance: Patient tolerated treatment well Patient left: with call bell/phone within reach;in bed;with bed alarm set;with SCD's reapplied  OT Visit Diagnosis: Other abnormalities of gait and mobility (R26.89);Muscle weakness (generalized) (M62.81);Pain Pain - part of body:  (back)                Time: [phone]  OT Time Calculation (min): 20 min Charges:  OT General Charges $OT Visit: 1 Visit OT Evaluation $OT Eval Moderate Complexity: 1 Mod  Barry Brunner, OT Acute Rehabilitation Services Office [phone]   Chancy Milroy 09/03/2023, 10:03 AM

## 2023-09-03 NOTE — Progress Notes (Signed)
Orthopedic Tech Progress Note Patient Details:  Curtis Hartman 1961-01-19 [id number]  Ortho Devices Type of Ortho Device: Lumbar corsett Ortho Device/Splint Location: Adjusted and at bedside Ortho Device/Splint Interventions: Ordered, Application, Adjustment   Post Interventions Patient Tolerated: Well Instructions Provided: Care of device, Adjustment of device  [name] Carmine Savoy 09/03/2023, 12:23 PM

## 2023-09-03 NOTE — Progress Notes (Signed)
    Providing Compassionate, Quality Care - Together   NEUROSURGERY PROGRESS NOTE     S: Pt unable to void o/n, foley inserted. Pt sitting in bedside chair upon exam.    O: EXAM:  BP (!) 181/89 (BP Location: Left Arm)   Pulse 91   Temp 98.7 F (37.1 C) (Oral)   Resp 17   Ht 5\' 9"  (1.753 m)   Wt 71.2 kg   SpO2 100%   BMI 23.18 kg/m     Awake, alert, oriented Comfortable appearing Speech fluent, appropriate  Dressings c/d/I MAEs   ASSESSMENT:  62 y.o. s/p L2-5 DLIF, percutaneous instrumentation, POD#2    PLAN: -Therapies as tolerated  -Cont supportive care -Call w/ questions/concerns.   Patrici Ranks, [facility]

## 2023-09-04 DIAGNOSIS — N179 Acute kidney failure, unspecified: Secondary | ICD-10-CM | POA: Diagnosis not present

## 2023-09-04 DIAGNOSIS — M48061 Spinal stenosis, lumbar region without neurogenic claudication: Secondary | ICD-10-CM | POA: Diagnosis not present

## 2023-09-04 DIAGNOSIS — R338 Other retention of urine: Secondary | ICD-10-CM | POA: Diagnosis not present

## 2023-09-04 LAB — CBC
HCT: 24.6 % — ABNORMAL LOW (ref 39.0–52.0)
Hemoglobin: 8 g/dL — ABNORMAL LOW (ref 13.0–17.0)
MCH: 29.2 pg (ref 26.0–34.0)
MCHC: 32.5 g/dL (ref 30.0–36.0)
MCV: 89.8 fL (ref 80.0–100.0)
Platelets: 243 10*3/uL (ref 150–400)
RBC: 2.74 MIL/uL — ABNORMAL LOW (ref 4.22–5.81)
RDW: 13.5 % (ref 11.5–15.5)
WBC: 11.6 10*3/uL — ABNORMAL HIGH (ref 4.0–10.5)
nRBC: 0 % (ref 0.0–0.2)

## 2023-09-04 LAB — POCT I-STAT 7, (LYTES, BLD GAS, ICA,H+H)
Acid-base deficit: 10 mmol/L — ABNORMAL HIGH (ref 0.0–2.0)
Acid-base deficit: 10 mmol/L — ABNORMAL HIGH (ref 0.0–2.0)
Acid-base deficit: 9 mmol/L — ABNORMAL HIGH (ref 0.0–2.0)
Acid-base deficit: 10 mmol/L — ABNORMAL HIGH (ref 0.0–2.0)
Bicarbonate: 15.7 mmol/L — ABNORMAL LOW (ref 20.0–28.0)
Bicarbonate: 16.3 mmol/L — ABNORMAL LOW (ref 20.0–28.0)
Bicarbonate: 18 mmol/L — ABNORMAL LOW (ref 20.0–28.0)
Bicarbonate: 17.1 mmol/L — ABNORMAL LOW (ref 20.0–28.0)
Calcium, Ion: 1.12 mmol/L — ABNORMAL LOW (ref 1.15–1.40)
Calcium, Ion: 1.19 mmol/L (ref 1.15–1.40)
Calcium, Ion: 1.11 mmol/L — ABNORMAL LOW (ref 1.15–1.40)
Calcium, Ion: 1.06 mmol/L — ABNORMAL LOW (ref 1.15–1.40)
HCT: 25 % — ABNORMAL LOW (ref 39.0–52.0)
HCT: 27 % — ABNORMAL LOW (ref 39.0–52.0)
HCT: 25 % — ABNORMAL LOW (ref 39.0–52.0)
HCT: 27 % — ABNORMAL LOW (ref 39.0–52.0)
Hemoglobin: 8.5 g/dL — ABNORMAL LOW (ref 13.0–17.0)
Hemoglobin: 9.2 g/dL — ABNORMAL LOW (ref 13.0–17.0)
Hemoglobin: 8.5 g/dL — ABNORMAL LOW (ref 13.0–17.0)
Hemoglobin: 9.2 g/dL — ABNORMAL LOW (ref 13.0–17.0)
O2 Saturation: 100 %
O2 Saturation: 100 %
O2 Saturation: 100 %
O2 Saturation: 100 %
Patient temperature: 35.1
Patient temperature: 35.2
Patient temperature: 35.5
Patient temperature: 35.6
Potassium: 5.5 mmol/L — ABNORMAL HIGH (ref 3.5–5.1)
Potassium: 5.7 mmol/L — ABNORMAL HIGH (ref 3.5–5.1)
Potassium: 5.6 mmol/L — ABNORMAL HIGH (ref 3.5–5.1)
Potassium: 5.1 mmol/L (ref 3.5–5.1)
Sodium: 138 mmol/L (ref 135–145)
Sodium: 140 mmol/L (ref 135–145)
Sodium: 140 mmol/L (ref 135–145)
Sodium: 140 mmol/L (ref 135–145)
TCO2: 17 mmol/L — ABNORMAL LOW (ref 22–32)
TCO2: 17 mmol/L — ABNORMAL LOW (ref 22–32)
TCO2: 19 mmol/L — ABNORMAL LOW (ref 22–32)
TCO2: 18 mmol/L — ABNORMAL LOW (ref 22–32)
pCO2 arterial: 30.9 mm[Hg] — ABNORMAL LOW (ref 32–48)
pCO2 arterial: 35.2 mm[Hg] (ref 32–48)
pCO2 arterial: 39.8 mm[Hg] (ref 32–48)
pCO2 arterial: 38.3 mm[Hg] (ref 32–48)
pH, Arterial: 7.263 — ABNORMAL LOW (ref 7.35–7.45)
pH, Arterial: 7.306 — ABNORMAL LOW (ref 7.35–7.45)
pH, Arterial: 7.256 — ABNORMAL LOW (ref 7.35–7.45)
pH, Arterial: 7.25 — ABNORMAL LOW (ref 7.35–7.45)
pO2, Arterial: 269 mm[Hg] — ABNORMAL HIGH (ref 83–108)
pO2, Arterial: 320 mm[Hg] — ABNORMAL HIGH (ref 83–108)
pO2, Arterial: 258 mm[Hg] — ABNORMAL HIGH (ref 83–108)
pO2, Arterial: 329 mm[Hg] — ABNORMAL HIGH (ref 83–108)

## 2023-09-04 LAB — GLUCOSE, CAPILLARY
Glucose-Capillary: 142 mg/dL — ABNORMAL HIGH (ref 70–99)
Glucose-Capillary: 160 mg/dL — ABNORMAL HIGH (ref 70–99)
Glucose-Capillary: 158 mg/dL — ABNORMAL HIGH (ref 70–99)
Glucose-Capillary: 161 mg/dL — ABNORMAL HIGH (ref 70–99)
Glucose-Capillary: 124 mg/dL — ABNORMAL HIGH (ref 70–99)

## 2023-09-04 LAB — BASIC METABOLIC PANEL
Anion gap: 9 (ref 5–15)
BUN: 71 mg/dL — ABNORMAL HIGH (ref 8–23)
CO2: 20 mmol/L — ABNORMAL LOW (ref 22–32)
Calcium: 8 mg/dL — ABNORMAL LOW (ref 8.9–10.3)
Chloride: 110 mmol/L (ref 98–111)
Creatinine, Ser: 4.92 mg/dL — ABNORMAL HIGH (ref 0.61–1.24)
GFR, Estimated: 13 mL/min — ABNORMAL LOW (ref >60)
Glucose, Bld: 143 mg/dL — ABNORMAL HIGH (ref 70–99)
Potassium: 3.4 mmol/L — ABNORMAL LOW (ref 3.5–5.1)
Sodium: 139 mmol/L (ref 135–145)

## 2023-09-04 MED ORDER — HEPARIN SODIUM (PORCINE) 5000 UNIT/ML IJ SOLN
5000.0000 [IU] | Freq: Three times a day (TID) | INTRAMUSCULAR | Status: DC
  Administered 2023-09-04 – 2023-09-11 (×19): 5000 [IU] via SUBCUTANEOUS
  Filled 2023-09-04 (×20): qty 1

## 2023-09-04 NOTE — Progress Notes (Signed)
PROGRESS NOTE    Curtis Hartman  UEA:[id number] DOB: 08-Dec-1960 DOA: 09/01/2023 PCP: Wilmon Pali, FNP   Brief Narrative:  Patient admitted under ortho for lumbar stenosis s/p L2-5 DLIF, percutaneous instrumentation.  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, FSGS, and diabetes mellitus -Exacerbated by urinary retention possible peri-operative hypovolemia and hypertensive urgency -Nephrology following -Foley placed with large urine output  Urinary retention -Postoperatively, Foley catheter inserted with immediate 900 cc urine output -Continue Flomax, voiding trial when stable in 48-72h   Diabetes mellitus, insulin dependent - moderately well controlled -Continue sliding scale -Resume home regimen at discharge   Lumbar stenosis -This post percutaneous instrumentation, postop day #2, management per primary neurosurgical team -Avoid morphine in the setting of AKI- currently on oxycodone, Dilaudid -Consider decreasing gabapentin dose if he becomes encephalopathic.  DVT prophylaxis: SCD's Start: 09/01/23 1935   Code Status:   Code Status: Full Code  Family Communication: None present  Status is: Inpt  Dispo: The patient is from: Home              Anticipated d/c is to: TBD              Anticipated d/c date is: Per primary  Consultants:  We are, nephro  Subjective: No acute issues/events  overnight -improved abdominal pain and urinary symptoms with Foley catheter.  Back pain still ongoing but minimally improved from prior.  He denies any paresthesias/weakness nausea vomiting diarrhea constipation headache fevers chills or chest pain  Objective: Vitals:   09/03/23 1219 09/03/23 1554 09/03/23 1944 09/04/23 0509  BP: 126/65 124/65 122/67 132/80  Pulse: 86 79 79 82  Resp:  18 18 18   Temp:  98.9 F (37.2 C) 99 F (37.2 C) 98.7 F (37.1 C)  TempSrc:  Oral Oral Oral  SpO2: 98% 100% 100% 98%  Weight:      Height:        Intake/Output Summary (Last 24 hours) at 09/04/2023 0806 Last data filed at 09/04/2023 0509 Gross per 24 hour  Intake 253 ml  Output 2450 ml  Net -2197 ml   Filed Weights   09/01/23 0819  Weight: 71.2 kg    Examination:  General:  Pleasantly resting in bed, No acute distress. HEENT:  Normocephalic atraumatic.  Sclerae nonicteric, noninjected.  Extraocular movements intact bilaterally. Neck:  Without mass or deformity.  Trachea is midline. Lungs:  Clear to auscultate bilaterally without rhonchi, wheeze, or rales. Heart:  Regular rate and rhythm.  Without murmurs, rubs, or gallops. Abdomen:  Soft, nontender, nondistended.  Without guarding or rebound. Extremities: Without cyanosis, clubbing, edema, or obvious deformity. Skin:  Warm and dry, no erythema.   Data Reviewed: I have personally reviewed following labs and imaging studies  CBC: Recent Labs  Lab 09/01/23 1230 09/01/23 1309 09/01/23 1404 09/01/23 1552 09/02/23 0839  WBC  --   --   --   --  10.7*  HGB 9.2* 8.5* 8.5* 9.2* 8.9*  HCT 27.0*  25.0* 25.0* 27.0* 27.2*  MCV  --   --   --   --  90.4  PLT  --   --   --   --  276   Basic Metabolic Panel: Recent Labs  Lab 09/01/23 1309 09/01/23 1404 09/01/23 1552 09/02/23 0839 09/03/23 0837  NA 140 140 140 143 140  K 5.5* 5.6* 5.1 4.2 3.7  CL  --   --   --  110 110  CO2  --   --   --  18* 18*  GLUCOSE  --   --   --  162* 186*  BUN   --   --   --  85* 75*  CREATININE  --   --   --  4.84* 4.96*  CALCIUM  --   --   --  8.3* 8.5*   GFR: Estimated Creatinine Clearance: 15.4 mL/min (A) (by C-G formula based on SCr of 4.96 mg/dL (H)).  CBG: Recent Labs  Lab 09/02/23 2107 09/03/23 0718 09/03/23 1200 09/03/23 1623 09/03/23 2123  GLUCAP 127* 169* 165* 129* 93   Recent Results (from the past 240 hour(s))  Surgical pcr screen     Status: None   Collection Time: 08/26/23  1:43 PM   Specimen: Nasal Mucosa; Nasal Swab  Result Value Ref Range Status   MRSA, PCR NEGATIVE NEGATIVE Final   Staphylococcus aureus NEGATIVE NEGATIVE Final    Comment: (NOTE) The Xpert SA Assay (FDA approved for NASAL specimens in patients 34 years of age and older), is one component of a comprehensive surveillance program. It is not intended to diagnose infection nor to guide or monitor treatment. Performed at [facility], [address] [place], [place] [address]          Radiology Studies: US RENAL  Result Date: 09/03/2023 CLINICAL DATA:  [id number] AKI (acute kidney injury) (HCC) [phone] EXAM: RENAL / URINARY TRACT ULTRASOUND COMPLETE COMPARISON:  None Available. FINDINGS: Right Kidney: Renal measurements: 12.8 x 6.2 x 7.2 cm = volume: 299 mL. Parenchymal echogenicity is increased. No mass or hydronephrosis visualized. Left Kidney: Renal measurements: 11.6 x 6.0 x 5.1 cm = volume: 184 mL. Parenchymal echogenicity is increased. No mass or hydronephrosis visualized. Bladder: Decompressed with a Foley catheter in place. Other: None. IMPRESSION: 1. No hydronephrosis or nephrolithiasis. 2. Increased renal parenchymal echogenicity as can be seen with medical renal disease. Electronically Signed   By: Lorenza Cambridge M.D.   On: 09/03/2023 14:33        Scheduled Meds:  amLODipine  10 mg Oral Daily   calcitRIOL  0.25 mcg Oral Once per day on Monday Wednesday Friday   calcium acetate  667 mg Oral BID WC   carvedilol  3.125 mg Oral BID WC    Chlorhexidine Gluconate Cloth  6 each Topical Daily   docusate sodium  100 mg Oral BID   fluticasone furoate-vilanterol  1 puff Inhalation Daily   And   umeclidinium bromide  1 puff Inhalation Daily   gabapentin  400 mg Oral BID   insulin aspart  0-15 Units Subcutaneous TID WC   insulin aspart  0-5 Units Subcutaneous QHS   sodium bicarbonate  650 mg Oral TID   sodium chloride flush  3 mL Intravenous Q12H   tamsulosin  0.4 mg Oral Daily   Continuous Infusions:   LOS: 3 days    Time spent:    Azucena Fallen, DO [facility]  If 7PM-7AM, please contact night-coverage  [email or web address]  09/04/2023, 8:06 AM

## 2023-09-04 NOTE — Progress Notes (Signed)
Occupational Therapy Treatment Patient Details Name: Curtis Hartman MRN: [id number] DOB: [birth year] Today's Date: 09/04/2023   History of present illness Pt is a 62 y.o. male s/p DLIF 09/01/23. PMH: s/p ACDF (Jan 2024), OA, HTN   OT comments  Pt in recliner and agreeable to OT session.  Completing LB dressing simulated with mod assist, but noted dizzy when standing.  Reclined BP 104/60 and pt reports dizziness has not subsided.  He was assisted back to bed with mod assist to stand on second trial, stepping to EOB with min assist using RW.  He was unable to sustain standing to check orthostatics but may be beneficial to check next session.  Pt reports his sister will be able to assist at dc, but would benefit from AE training as well.  Will follow acutely.       If plan is discharge home, recommend the following:  A lot of help with bathing/dressing/bathroom;Assistance with cooking/housework;Assist for transportation;A lot of help with walking and/or transfers   Equipment Recommendations  None recommended by OT    Recommendations for Other Services      Precautions / Restrictions Precautions Precautions: Back;Other (comment) Precaution Booklet Issued: Yes (comment) Precaution Comments: foley Required Braces or Orthoses: Spinal Brace Spinal Brace: Lumbar corset;Applied in sitting position Restrictions Weight Bearing Restrictions: No Other Position/Activity Restrictions: Pt required assist to don brace.       Mobility Bed Mobility Overal bed mobility: Needs Assistance Bed Mobility: Rolling, Sit to Sidelying Rolling: Contact guard assist       Sit to sidelying: Mod assist General bed mobility comments: assist for guiding trunk and bringing LEs back to bed    Transfers Overall transfer level: Needs assistance Equipment used: Rolling walker (2 wheels) Transfers: Sit to/from Stand, Bed to chair/wheelchair/BSC Sit to Stand: Min assist, Mod assist     Step pivot transfers:  Min assist     General transfer comment: cueing for hand placement and safety, min assist initally but reports feeling dizzy and requires mod assist on 2nd attempt     Balance Overall balance assessment: Needs assistance Sitting-balance support: No upper extremity supported, Feet supported Sitting balance-Leahy Scale: Fair     Standing balance support: Bilateral upper extremity supported, Single extremity supported, During functional activity Standing balance-Leahy Scale: Poor Standing balance comment: relies on BUE support                           ADL either performed or assessed with clinical judgement   ADL Overall ADL's : Needs assistance/impaired     Grooming: Set up;Sitting               Lower Body Dressing: Moderate assistance;Sit to/from stand Lower Body Dressing Details (indicate cue type and reason): continues to require assist f or socks, but able to simulate donning pants with assist to pull over  hips. would benefit from AE training. Toilet Transfer: Minimal assistance;Moderate assistance;Rolling walker (2 wheels) Toilet Transfer Details (indicate cue type and reason): recliner to bed         Functional mobility during ADLs: Minimal assistance;Moderate assistance;Rolling walker (2 wheels);Cueing for safety      Extremity/Trunk Assessment              Vision       Perception     Praxis      Cognition Arousal: Alert Behavior During Therapy: WFL for tasks assessed/performed Overall Cognitive Status: Within Functional Limits for tasks assessed  General Comments: some decreased attention, likley due to pain but overall appears [facility]        Exercises      Shoulder Instructions       General Comments pt dizzy in standing and BP is 104/60 after sitting with legs elevated, reports dizziness did not go aaway and RN notified.  Woudl benefit from [employer] nex t session    Pertinent  Vitals/ Pain       Pain Assessment Pain Assessment: Faces Faces Pain Scale: Hurts even more Pain Location: back/bilat thighs, stomach Pain Descriptors / Indicators: Grimacing, Guarding, Sore Pain Intervention(s): Limited activity within patient's tolerance, Monitored during session, Repositioned  Home Living                                          Prior Functioning/Environment              Frequency  Min 1X/week        Progress Toward Goals  OT Goals(current goals can now be found in the care plan section)  Progress towards OT goals: Progressing toward goals (slowly)  Acute Rehab OT Goals Patient Stated Goal: home when ready OT Goal Formulation: With patient Time For Goal Achievement: 09/17/23 Potential to Achieve Goals: Good  Plan      Co-evaluation                 AM-PAC OT "6 Clicks" Daily Activity     Outcome Measure   Help from another person eating meals?: A Little Help from another person taking care of personal grooming?: A Little Help from another person toileting, which includes using toliet, bedpan, or urinal?: A Lot Help from another person bathing (including washing, rinsing, drying)?: A Lot Help from another person to put on and taking off regular upper body clothing?: A Little Help from another person to put on and taking off regular lower body clothing?: A Lot 6 Click Score: 15    End of Session Equipment Utilized During Treatment: Gait belt;Rolling walker (2 wheels);Back brace  OT Visit Diagnosis: Other abnormalities of gait and mobility (R26.89);Muscle weakness (generalized) (M62.81);Pain Pain - part of body:  (back)   Activity Tolerance Patient tolerated treatment well   Patient Left in bed;with call bell/phone within reach;with bed alarm set   Nurse Communication Mobility status;Other (comment) (hypotension)        Time: 1107-1130 OT Time Calculation (min): 23 min  Charges: OT General Charges $OT Visit:  1 Visit OT Treatments $Self Care/Home Management : 23-37 mins  Barry Brunner, OT Acute Rehabilitation Services Office [phone]   Curtis Hartman 09/04/2023, 1:21 PM

## 2023-09-04 NOTE — Progress Notes (Signed)
    Providing Compassionate, Quality Care - Together   NEUROSURGERY PROGRESS NOTE     S: No issues overnight.    O: EXAM:  BP (!) 147/74 (BP Location: Left Arm)   Pulse 79   Temp 99 F (37.2 C) (Oral)   Resp 16   Ht 5\' 9"  (1.753 m)   Wt 71.2 kg   SpO2 97%   BMI 23.18 kg/m     Awake, alert, oriented  Speech fluent, appropriate  MAEs   ASSESSMENT:  62 y.o. s/p  L2-5 DLIF, percutaneous instrumentation, POD#3    PLAN: -Therapies as tolerated -Continue supportive care -Appreciate TRH/Nephrology recommendations -Call w/ questions/concerns.   Patrici Ranks, [facility]

## 2023-09-04 NOTE — Progress Notes (Signed)
Subjective:  Main concern this morning is in regards to his pain. Continues to have poor appetite overall, some generalized weakness and fatigue. Has weird taste in his mouth intermittently which is not acute and he cannot describe the taste. Does not have OP nephrology f/u scheduled at this time but understands importance of close f/u.  Objective Vital signs in last 24 hours: Vitals:   09/03/23 1219 09/03/23 1554 09/03/23 1944 09/04/23 0509  BP: 126/65 124/65 122/67 132/80  Pulse: 86 79 79 82  Resp:  18 18 18   Temp:  98.9 F (37.2 C) 99 F (37.2 C) 98.7 F (37.1 C)  TempSrc:  Oral Oral Oral  SpO2: 98% 100% 100% 98%  Weight:      Height:        Intake/Output Summary (Last 24 hours) at 09/04/2023 0740 Last data filed at 09/04/2023 0509 Gross per 24 hour  Intake 253 ml  Output 2450 ml  Net -2197 ml   Labs Reviewed: CBC: WBC 11.6 Hgb 8.0 HCT 24.6  BMP: K 3.4 CO2 20 (18) BUN 71/Cr 4.92 (75/4.96) Ca 8.0 GFR 13  Protein/creatinine ratio pending Urine sodium pending UA pending  I's/O's: Net -637 mL, 2450L UOP 10/31  Physical Exam: Constitutional:Chronically ill appearing gentleman in no acute distress. Cardio:Regular rate and rhythm. No murmurs, rubs, or gallops. Pulm:Clear to auscultation bilaterally. Normal work of breathing on room air. Abdomen:Soft, nontender, nondistended. WU:JWJXB catheter in place with clear urine in collection bag. JYN:[id number] for extremity edema. Skin:Warm and dry. Neuro:Alert and oriented x3. No focal deficit noted. Psych:Pleasant mood and affect.  Assessment/ Plan: Pt is a 62 y.o. 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   AKI on CKD stage IV: AM labs showing stable renal function. Continues to make urine, currently has foley catheter in place due to urinary retention overnight 10/30.  Renal ultrasound without hydronephrosis or nephrolithiasis, shows evidence of medical renal disease. Does not seem to be uremic at this time. Avoid nephrotoxic agents, renally adjust medications Recommend decreased dose of gabapentin, 600 mg/day maximum with current renal function Would advise against oxycodone use at this time and use caution with robaxin Would continue to hold ARB, recommend discontinuing at discharge Supportive care for now, encourage PO intake Follow up urine studies Trend renal function Maintain foley catheter; agree with flomax for urinary retention No indication for emergent HD Nephrology will sign off at this time. Recommend close f/u OP with Dr. Wolfgang Phoenix. HTN/Volume: Stable. On amlodipine, carvedilol. Per primary.  Acute on chronic anemia: Suspect 2/2 acute blood loss during surgery. Not on EPO outpatient that I can see. Per primary.  Secondary hyperparathyroidism/CMD/Hyperphosphatemia: OP calcitriol resumed while hospitalized. Additionally receiving phoslo BID with meals. Metabolic acidosis: Chronic. OP sodium bicarbonate has been resumed while admitted. Diabetes: Well-controlled. Per primary. Lumbar stenosis: POD3 from lumbar spine fusion. Per neurosurgery.    Labs: Basic Metabolic Panel: Recent Labs  Lab 09/02/23 0839 09/03/23 0837  NA 143 140  K 4.2 3.7  CL 110 110  CO2 18* 18*  GLUCOSE 162* 186*  BUN 85* 75*  CREATININE 4.84* 4.96*  CALCIUM 8.3* 8.5*   Liver Function Tests: No results for input(s): "AST", "ALT", "ALKPHOS", "BILITOT", "PROT", "ALBUMIN" in the last 168 hours. No results for input(s): "LIPASE", "AMYLASE" in the last 168 hours. No results for input(s): "AMMONIA" in the last 168  hours. CBC: Recent Labs  Lab 09/02/23 0839  WBC 10.7*  HGB 8.9*  HCT 27.2*  MCV 90.4  PLT 276   Cardiac Enzymes: No results for input(s): "CKTOTAL", "CKMB", "CKMBINDEX", "TROPONINI" in the last 168 hours. CBG: Recent Labs  Lab 09/02/23 2107  09/03/23 0718 09/03/23 1200 09/03/23 1623 09/03/23 2123  GLUCAP 127* 169* 165* 129* 93    Iron Studies: No results for input(s): "IRON", "TIBC", "TRANSFERRIN", "FERRITIN" in the last 72 hours. Studies/Results: US RENAL  Result Date: 09/03/2023 CLINICAL DATA:  [id number] AKI (acute kidney injury) (HCC) [id number] EXAM: RENAL / URINARY TRACT ULTRASOUND COMPLETE COMPARISON:  None Available. FINDINGS: Right Kidney: Renal measurements: 12.8 x 6.2 x 7.2 cm = volume: 299 mL. Parenchymal echogenicity is increased. No mass or hydronephrosis visualized. Left Kidney: Renal measurements: 11.6 x 6.0 x 5.1 cm = volume: 184 mL. Parenchymal echogenicity is increased. No mass or hydronephrosis visualized. Bladder: Decompressed with a Foley catheter in place. Other: None. IMPRESSION: 1. No hydronephrosis or nephrolithiasis. 2. Increased renal parenchymal echogenicity as can be seen with medical renal disease. Electronically Signed   By: Lorenza Cambridge M.D.   On: 09/03/2023 14:33    Medications:  Scheduled Medications:  amLODipine  10 mg Oral Daily   calcitRIOL  0.25 mcg Oral Once per day on Monday Wednesday Friday   calcium acetate  667 mg Oral BID WC   carvedilol  3.125 mg Oral BID WC   Chlorhexidine Gluconate Cloth  6 each Topical Daily   docusate sodium  100 mg Oral BID   fluticasone furoate-vilanterol  1 puff Inhalation Daily   And   umeclidinium bromide  1 puff Inhalation Daily   gabapentin  400 mg Oral BID   insulin aspart  0-15 Units Subcutaneous TID WC   insulin aspart  0-5 Units Subcutaneous QHS   sodium bicarbonate  650 mg Oral TID   sodium chloride flush  3 mL Intravenous Q12H   tamsulosin  0.4 mg Oral Daily   I have reviewed scheduled and prn medications.   Champ Mungo, DO Internal Medicine PGY-3 09/04/2023,7:40 AM  LOS: 3 days

## 2023-09-04 NOTE — Plan of Care (Signed)
  Problem: Education: Goal: Knowledge of General Education information will improve Description: Including pain rating scale, medication(s)/side effects and non-pharmacologic comfort measures Outcome: Not Progressing   Problem: Health Behavior/Discharge Planning: Goal: Ability to manage health-related needs will improve Outcome: Not Progressing   Problem: Clinical Measurements: Goal: Ability to maintain clinical measurements within normal limits will improve Outcome: Not Progressing Goal: Will remain free from infection Outcome: Not Progressing Goal: Diagnostic test results will improve Outcome: Not Progressing Goal: Respiratory complications will improve Outcome: Not Progressing Goal: Cardiovascular complication will be avoided Outcome: Not Progressing   Problem: Activity: Goal: Risk for activity intolerance will decrease Outcome: Not Progressing   Problem: Nutrition: Goal: Adequate nutrition will be maintained Outcome: Not Progressing   Problem: Coping: Goal: Level of anxiety will decrease Outcome: Not Progressing   Problem: Elimination: Goal: Will not experience complications related to bowel motility Outcome: Not Progressing Goal: Will not experience complications related to urinary retention Outcome: Not Progressing   Problem: Pain Management: Goal: General experience of comfort will improve Outcome: Not Progressing   Problem: Safety: Goal: Ability to remain free from injury will improve Outcome: Not Progressing   Problem: Skin Integrity: Goal: Risk for impaired skin integrity will decrease Outcome: Not Progressing   Problem: Education: Goal: Ability to describe self-care measures that may prevent or decrease complications (Diabetes Survival Skills Education) will improve Outcome: Not Progressing Goal: Individualized Educational Video(s) Outcome: Not Progressing   Problem: Coping: Goal: Ability to adjust to condition or change in health will  improve Outcome: Not Progressing   Problem: Fluid Volume: Goal: Ability to maintain a balanced intake and output will improve Outcome: Not Progressing   Problem: Health Behavior/Discharge Planning: Goal: Ability to identify and utilize available resources and services will improve Outcome: Not Progressing Goal: Ability to manage health-related needs will improve Outcome: Not Progressing   Problem: Metabolic: Goal: Ability to maintain appropriate glucose levels will improve Outcome: Not Progressing   Problem: Nutritional: Goal: Maintenance of adequate nutrition will improve Outcome: Not Progressing Goal: Progress toward achieving an optimal weight will improve Outcome: Not Progressing   Problem: Skin Integrity: Goal: Risk for impaired skin integrity will decrease Outcome: Not Progressing   Problem: Tissue Perfusion: Goal: Adequacy of tissue perfusion will improve Outcome: Not Progressing   Problem: Education: Goal: Ability to verbalize activity precautions or restrictions will improve Outcome: Not Progressing Goal: Knowledge of the prescribed therapeutic regimen will improve Outcome: Not Progressing Goal: Understanding of discharge needs will improve Outcome: Not Progressing   Problem: Activity: Goal: Ability to avoid complications of mobility impairment will improve Outcome: Not Progressing Goal: Ability to tolerate increased activity will improve Outcome: Not Progressing Goal: Will remain free from falls Outcome: Not Progressing   Problem: Bowel/Gastric: Goal: Gastrointestinal status for postoperative course will improve Outcome: Not Progressing   Problem: Clinical Measurements: Goal: Ability to maintain clinical measurements within normal limits will improve Outcome: Not Progressing Goal: Postoperative complications will be avoided or minimized Outcome: Not Progressing Goal: Diagnostic test results will improve Outcome: Not Progressing   Problem: Pain  Management: Goal: Pain level will decrease Outcome: Not Progressing   Problem: Skin Integrity: Goal: Will show signs of wound healing Outcome: Not Progressing   Problem: Health Behavior/Discharge Planning: Goal: Identification of resources available to assist in meeting health care needs will improve Outcome: Not Progressing   Problem: Bladder/Genitourinary: Goal: Urinary functional status for postoperative course will improve Outcome: Not Progressing

## 2023-09-04 NOTE — Progress Notes (Addendum)
Physical Therapy Treatment Patient Details Name: Curtis Hartman MRN: [id number] DOB: 1961/02/11 Today's Date: 09/04/2023   History of Present Illness Pt is a 62 y.o. male s/p DLIF 09/01/23. PMH: s/p ACDF (Jan 2024), OA, HTN    PT Comments  Pt required mod assist bed mobility, min assist sit to stand, and min assist amb 35' with RW. Increased gait distance as compared to previous sessions as well as pain being less of a limiting factor. Pt in recliner with feet elevated at end of session. Pt would benefit from HHPT upon d/c.      If plan is discharge home, recommend the following: A little help with walking and/or transfers;A little help with bathing/dressing/bathroom;Assistance with cooking/housework;Assist for transportation;Help with stairs or ramp for entrance   Can travel by private vehicle        Equipment Recommendations  Rolling walker (2 wheels)    Recommendations for Other Services       Precautions / Restrictions Precautions Precautions: Back;Other (comment) Precaution Comments: foley Required Braces or Orthoses: Spinal Brace Spinal Brace: Lumbar corset;Applied in sitting position Restrictions Other Position/Activity Restrictions: Pt required assist to don brace.     Mobility  Bed Mobility Overal bed mobility: Needs Assistance Bed Mobility: Rolling, Sidelying to Sit Rolling: Min assist Sidelying to sit: Mod assist, Used rails       General bed mobility comments: cues for sequencing, assist with BLE and trunk, increased time    Transfers Overall transfer level: Needs assistance Equipment used: Rolling walker (2 wheels) Transfers: Sit to/from Stand Sit to Stand: Min assist           General transfer comment: cueing for hand placement, assist to power up and stabilize balance    Ambulation/Gait Ambulation/Gait assistance: Min assist Gait Distance (Feet): 35 Feet Assistive device: Rolling walker (2 wheels) Gait Pattern/deviations: Step-through  pattern, Decreased stride length, Trunk flexed Gait velocity: Tends to try and walk too fast.     General Gait Details: cues to stay close to RW, improve posture, and decrease cadence. Assist for stability.   Stairs             Wheelchair Mobility     Tilt Bed    Modified Rankin (Stroke Patients Only)       Balance Overall balance assessment: Needs assistance Sitting-balance support: Feet supported, Single extremity supported Sitting balance-Leahy Scale: Fair     Standing balance support: Bilateral upper extremity supported, During functional activity, Reliant on assistive device for balance Standing balance-Leahy Scale: Poor                              Cognition Arousal: Alert Behavior During Therapy: WFL for tasks assessed/performed Overall Cognitive Status: Within Functional Limits for tasks assessed                                          Exercises      General Comments        Pertinent Vitals/Pain Pain Assessment Pain Assessment: Faces Faces Pain Scale: Hurts even more Pain Location: back/bilat thighs Pain Descriptors / Indicators: Grimacing, Guarding, Sore Pain Intervention(s): Limited activity within patient's tolerance, Monitored during session, Repositioned, Premedicated before session    Home Living  Prior Function            PT Goals (current goals can now be found in the care plan section) Acute Rehab PT Goals Patient Stated Goal: home Progress towards PT goals: Progressing toward goals    Frequency    Min 5X/week      PT Plan      Co-evaluation              AM-PAC PT "6 Clicks" Mobility   Outcome Measure  Help needed turning from your back to your side while in a flat bed without using bedrails?: A Little Help needed moving from lying on your back to sitting on the side of a flat bed without using bedrails?: A Lot Help needed moving to and from a  bed to a chair (including a wheelchair)?: A Little Help needed standing up from a chair using your arms (e.g., wheelchair or bedside chair)?: A Little Help needed to walk in hospital room?: A Little Help needed climbing 3-5 steps with a railing? : A Lot 6 Click Score: 16    End of Session Equipment Utilized During Treatment: Gait belt;Back brace Activity Tolerance: Patient tolerated treatment well Patient left: in chair;with call bell/phone within reach;with chair alarm set Nurse Communication: Mobility status PT Visit Diagnosis: Other abnormalities of gait and mobility (R26.89);Muscle weakness (generalized) (M62.81);Pain     Time: 4098-1191 PT Time Calculation (min) (ACUTE ONLY): 24 min  Charges:    $Gait Training: 23-37 mins PT General Charges $$ ACUTE PT VISIT: 1 Visit                     Ferd Glassing., PT  Office # [phone]    Ilda Foil 09/04/2023, 12:09 PM

## 2023-09-05 DIAGNOSIS — M48061 Spinal stenosis, lumbar region without neurogenic claudication: Secondary | ICD-10-CM | POA: Diagnosis not present

## 2023-09-05 DIAGNOSIS — N179 Acute kidney failure, unspecified: Secondary | ICD-10-CM | POA: Diagnosis not present

## 2023-09-05 DIAGNOSIS — R338 Other retention of urine: Secondary | ICD-10-CM | POA: Diagnosis not present

## 2023-09-05 LAB — BASIC METABOLIC PANEL
Anion gap: 14 (ref 5–15)
BUN: 76 mg/dL — ABNORMAL HIGH (ref 8–23)
CO2: 19 mmol/L — ABNORMAL LOW (ref 22–32)
Calcium: 8.1 mg/dL — ABNORMAL LOW (ref 8.9–10.3)
Chloride: 104 mmol/L (ref 98–111)
Creatinine, Ser: 4.85 mg/dL — ABNORMAL HIGH (ref 0.61–1.24)
GFR, Estimated: 13 mL/min — ABNORMAL LOW (ref >60)
Glucose, Bld: 127 mg/dL — ABNORMAL HIGH (ref 70–99)
Potassium: 3.7 mmol/L (ref 3.5–5.1)
Sodium: 137 mmol/L (ref 135–145)

## 2023-09-05 LAB — GLUCOSE, CAPILLARY
Glucose-Capillary: 129 mg/dL — ABNORMAL HIGH (ref 70–99)
Glucose-Capillary: 136 mg/dL — ABNORMAL HIGH (ref 70–99)
Glucose-Capillary: 196 mg/dL — ABNORMAL HIGH (ref 70–99)
Glucose-Capillary: 67 mg/dL — ABNORMAL LOW (ref 70–99)

## 2023-09-05 NOTE — Progress Notes (Signed)
PROGRESS NOTE    Curtis Hartman  BJY:[id number] DOB: 1961/10/09 DOA: 09/01/2023 PCP: Wilmon Pali, FNP   Brief Narrative:  Patient admitted under ortho for lumbar stenosis s/p L2-5 DLIF, percutaneous instrumentation.  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, FSGS, and diabetes mellitus -Exacerbated by urinary retention possible peri-operative hypovolemia and hypertensive urgency -Nephrology following -Foley placed with large urine output -urine output remains appropriate  Urinary retention -Postoperatively, Foley catheter inserted with immediate 900 cc urine output -Continue Flomax, voiding trial when stable in the next 24 to 48 hours   Diabetes mellitus, insulin dependent - moderately well controlled -Continue sliding scale -Resume home regimen at discharge   Lumbar stenosis -This post percutaneous instrumentation, postop day #2, management per primary neurosurgical team -Avoid morphine in the setting of AKI- currently on oxycodone, Dilaudid -Consider decreasing gabapentin dose if he becomes encephalopathic.  DVT prophylaxis: heparin injection 5,000 Units Start: 09/04/23 1715 SCD's Start: 09/01/23 1935   Code Status:   Code Status: Full Code  Family Communication: None present  Status is: Inpt  Dispo: The patient is from: Home              Anticipated d/c is to: Per primary               Anticipated d/c date is: Per primary  Consultants:  We are, nephro  Subjective: No acute issues/events overnight -reports ongoing pain in the lumbar postsurgical area but improving from prior, denies nausea vomiting diarrhea constipation headache fevers chills or chest pain  Objective: Vitals:   09/04/23 1524 09/04/23 1927 09/05/23 0622 09/05/23 0736  BP: 118/66 127/69 137/73   Pulse: 71 78 78 76  Resp: 16   17  Temp: 99.1 F (37.3 C) 98.9 F (37.2 C) 98.2 F (36.8 C)   TempSrc: Oral Oral Oral   SpO2: 99% 99% 99% 98%  Weight:      Height:        Intake/Output Summary (Last 24 hours) at 09/05/2023 0814 Last data filed at 09/05/2023 0622 Gross per 24 hour  Intake 480 ml  Output 1450 ml  Net -970 ml   Filed Weights   09/01/23 0819  Weight: 71.2 kg    Examination:  General:  Pleasantly resting in bed, No acute distress. HEENT:  Normocephalic atraumatic.  Sclerae nonicteric, noninjected.  Extraocular movements intact bilaterally. Neck:  Without mass or deformity.  Trachea is midline. Lungs:  Clear to auscultate bilaterally without rhonchi, wheeze, or rales. Heart:  Regular rate and rhythm.  Without murmurs, rubs, or gallops. Abdomen:  Soft, nontender, nondistended.  Without guarding or rebound. Extremities: Without cyanosis, clubbing, edema, or obvious deformity. Skin:  Warm and dry, no erythema.   Data Reviewed: I have personally reviewed following labs and imaging studies  CBC: Recent Labs  Lab 09/01/23 1309 09/01/23 1404 09/01/23 1552 09/02/23 0839 09/04/23 0737  WBC  --   --   --  10.7* 11.6*  HGB 8.5* 8.5* 9.2*  8.9* 8.0*  HCT 25.0* 25.0* 27.0* 27.2* 24.6*  MCV  --   --   --  90.4 89.8  PLT  --   --   --  276 243   Basic Metabolic Panel: Recent Labs  Lab 09/01/23 1552 09/02/23 0839 09/03/23 0837 09/04/23 0737 09/05/23 0322  NA 140 143 140 139 137  K 5.1 4.2 3.7 3.4* 3.7  CL  --  110 110 110 104  CO2  --  18* 18* 20* 19*  GLUCOSE  --   162* 186* 143* 127*  BUN  --  85* 75* 71* 76*  CREATININE  --  4.84* 4.96* 4.92* 4.85*  CALCIUM  --  8.3* 8.5* 8.0* 8.1*   GFR: Estimated Creatinine Clearance: 15.8 mL/min (A) (by C-G formula based on SCr of 4.85 mg/dL (H)).  CBG: Recent Labs  Lab 09/04/23 0850 09/04/23 1129 09/04/23 1150 09/04/23 1642 09/04/23 2205  GLUCAP 142* 160* 158* 161* 124*   Recent Results (from the past 240 hour(s))  Surgical pcr screen     Status: None   Collection Time: 08/26/23  1:43 PM   Specimen: Nasal Mucosa; Nasal Swab  Result Value Ref Range Status   MRSA, PCR NEGATIVE NEGATIVE Final   Staphylococcus aureus NEGATIVE NEGATIVE Final    Comment: (NOTE) The Xpert SA Assay (FDA approved for NASAL specimens in patients 14 years of age and older), is one component of a comprehensive surveillance program. It is not intended to diagnose infection nor to guide or monitor treatment. Performed at [facility], [address] [place], [place] [address]          Radiology Studies: US RENAL  Result Date: 09/03/2023 CLINICAL DATA:  [id number] AKI (acute kidney injury) (HCC) [phone] EXAM: RENAL / URINARY TRACT ULTRASOUND COMPLETE COMPARISON:  None Available. FINDINGS: Right Kidney: Renal measurements: 12.8 x 6.2 x 7.2 cm = volume: 299 mL. Parenchymal echogenicity is increased. No mass or hydronephrosis visualized. Left Kidney: Renal measurements: 11.6 x 6.0 x 5.1 cm = volume: 184 mL. Parenchymal echogenicity is increased. No mass or hydronephrosis visualized. Bladder: Decompressed with a Foley catheter in place. Other: None. IMPRESSION: 1. No hydronephrosis or nephrolithiasis. 2. Increased renal parenchymal echogenicity as can be seen with medical renal disease. Electronically Signed   By: Lorenza Cambridge M.D.   On: 09/03/2023 14:33        Scheduled Meds:  amLODipine  10 mg Oral Daily   calcitRIOL  0.25 mcg Oral Once per day on Monday Wednesday Friday   calcium acetate  667 mg Oral BID WC    carvedilol  3.125 mg Oral BID WC   Chlorhexidine Gluconate Cloth  6 each Topical Daily   docusate sodium  100 mg Oral BID   fluticasone furoate-vilanterol  1 puff Inhalation Daily   And   umeclidinium bromide  1 puff Inhalation Daily   gabapentin  400 mg Oral BID   heparin injection (subcutaneous)  5,000 Units Subcutaneous Q8H   insulin aspart  0-15 Units Subcutaneous TID WC   insulin aspart  0-5 Units Subcutaneous QHS   sodium bicarbonate  650 mg Oral TID   sodium chloride flush  3 mL Intravenous Q12H   tamsulosin  0.4 mg Oral Daily   Continuous Infusions:   LOS: 4 days    Time spent:    Azucena Fallen, DO [facility]  If 7PM-7AM, please contact night-coverage [email or web address]  09/05/2023, 8:14 AM

## 2023-09-05 NOTE — Plan of Care (Signed)
  Problem: Education: Goal: Knowledge of General Education information will improve Description: Including pain rating scale, medication(s)/side effects and non-pharmacologic comfort measures Outcome: Progressing   Problem: Health Behavior/Discharge Planning: Goal: Ability to manage health-related needs will improve Outcome: Progressing   Problem: Clinical Measurements: Goal: Ability to maintain clinical measurements within normal limits will improve Outcome: Progressing Goal: Will remain free from infection Outcome: Progressing Goal: Diagnostic test results will improve Outcome: Progressing Goal: Respiratory complications will improve Outcome: Progressing Goal: Cardiovascular complication will be avoided Outcome: Progressing   Problem: Activity: Goal: Risk for activity intolerance will decrease Outcome: Progressing   Problem: Nutrition: Goal: Adequate nutrition will be maintained Outcome: Progressing   Problem: Coping: Goal: Level of anxiety will decrease Outcome: Progressing   Problem: Elimination: Goal: Will not experience complications related to bowel motility Outcome: Progressing Goal: Will not experience complications related to urinary retention Outcome: Progressing   Problem: Pain Management: Goal: General experience of comfort will improve Outcome: Progressing   Problem: Safety: Goal: Ability to remain free from injury will improve Outcome: Progressing   Problem: Skin Integrity: Goal: Risk for impaired skin integrity will decrease Outcome: Progressing   Problem: Education: Goal: Ability to describe self-care measures that may prevent or decrease complications (Diabetes Survival Skills Education) will improve Outcome: Progressing Goal: Individualized Educational Video(s) Outcome: Progressing   Problem: Coping: Goal: Ability to adjust to condition or change in health will improve Outcome: Progressing   Problem: Fluid Volume: Goal: Ability to  maintain a balanced intake and output will improve Outcome: Progressing   Problem: Health Behavior/Discharge Planning: Goal: Ability to identify and utilize available resources and services will improve Outcome: Progressing Goal: Ability to manage health-related needs will improve Outcome: Progressing   Problem: Metabolic: Goal: Ability to maintain appropriate glucose levels will improve Outcome: Progressing   Problem: Nutritional: Goal: Maintenance of adequate nutrition will improve Outcome: Progressing Goal: Progress toward achieving an optimal weight will improve Outcome: Progressing   Problem: Skin Integrity: Goal: Risk for impaired skin integrity will decrease Outcome: Progressing   Problem: Tissue Perfusion: Goal: Adequacy of tissue perfusion will improve Outcome: Progressing   Problem: Education: Goal: Ability to verbalize activity precautions or restrictions will improve Outcome: Progressing Goal: Knowledge of the prescribed therapeutic regimen will improve Outcome: Progressing Goal: Understanding of discharge needs will improve Outcome: Progressing   Problem: Activity: Goal: Ability to avoid complications of mobility impairment will improve Outcome: Progressing Goal: Ability to tolerate increased activity will improve Outcome: Progressing Goal: Will remain free from falls Outcome: Progressing   Problem: Bowel/Gastric: Goal: Gastrointestinal status for postoperative course will improve Outcome: Progressing   Problem: Clinical Measurements: Goal: Ability to maintain clinical measurements within normal limits will improve Outcome: Progressing Goal: Postoperative complications will be avoided or minimized Outcome: Progressing Goal: Diagnostic test results will improve Outcome: Progressing   Problem: Pain Management: Goal: Pain level will decrease Outcome: Progressing   Problem: Skin Integrity: Goal: Will show signs of wound healing Outcome:  Progressing   Problem: Health Behavior/Discharge Planning: Goal: Identification of resources available to assist in meeting health care needs will improve Outcome: Progressing   Problem: Bladder/Genitourinary: Goal: Urinary functional status for postoperative course will improve Outcome: Progressing

## 2023-09-05 NOTE — Plan of Care (Signed)
  Problem: Education: Goal: Knowledge of General Education information will improve Description Including pain rating scale, medication(s)/side effects and non-pharmacologic comfort measures Outcome: Progressing   

## 2023-09-05 NOTE — Plan of Care (Signed)
  Problem: Education: Goal: Knowledge of General Education information will improve Description: Including pain rating scale, medication(s)/side effects and non-pharmacologic comfort measures Outcome: Not Progressing   Problem: Health Behavior/Discharge Planning: Goal: Ability to manage health-related needs will improve Outcome: Not Progressing   Problem: Clinical Measurements: Goal: Ability to maintain clinical measurements within normal limits will improve Outcome: Not Progressing Goal: Will remain free from infection Outcome: Not Progressing Goal: Diagnostic test results will improve Outcome: Not Progressing Goal: Respiratory complications will improve Outcome: Not Progressing Goal: Cardiovascular complication will be avoided Outcome: Not Progressing   Problem: Activity: Goal: Risk for activity intolerance will decrease Outcome: Not Progressing   Problem: Nutrition: Goal: Adequate nutrition will be maintained Outcome: Not Progressing   Problem: Coping: Goal: Level of anxiety will decrease Outcome: Not Progressing   Problem: Elimination: Goal: Will not experience complications related to bowel motility Outcome: Not Progressing Goal: Will not experience complications related to urinary retention Outcome: Not Progressing   Problem: Pain Management: Goal: General experience of comfort will improve Outcome: Not Progressing   Problem: Safety: Goal: Ability to remain free from injury will improve Outcome: Not Progressing   Problem: Skin Integrity: Goal: Risk for impaired skin integrity will decrease Outcome: Not Progressing   Problem: Education: Goal: Ability to describe self-care measures that may prevent or decrease complications (Diabetes Survival Skills Education) will improve Outcome: Not Progressing Goal: Individualized Educational Video(s) Outcome: Not Progressing   Problem: Coping: Goal: Ability to adjust to condition or change in health will  improve Outcome: Not Progressing   Problem: Fluid Volume: Goal: Ability to maintain a balanced intake and output will improve Outcome: Not Progressing   Problem: Health Behavior/Discharge Planning: Goal: Ability to identify and utilize available resources and services will improve Outcome: Not Progressing Goal: Ability to manage health-related needs will improve Outcome: Not Progressing   Problem: Metabolic: Goal: Ability to maintain appropriate glucose levels will improve Outcome: Not Progressing   Problem: Nutritional: Goal: Maintenance of adequate nutrition will improve Outcome: Not Progressing Goal: Progress toward achieving an optimal weight will improve Outcome: Not Progressing   Problem: Skin Integrity: Goal: Risk for impaired skin integrity will decrease Outcome: Not Progressing   Problem: Tissue Perfusion: Goal: Adequacy of tissue perfusion will improve Outcome: Not Progressing   Problem: Education: Goal: Ability to verbalize activity precautions or restrictions will improve Outcome: Not Progressing Goal: Knowledge of the prescribed therapeutic regimen will improve Outcome: Not Progressing Goal: Understanding of discharge needs will improve Outcome: Not Progressing   Problem: Activity: Goal: Ability to avoid complications of mobility impairment will improve Outcome: Not Progressing Goal: Ability to tolerate increased activity will improve Outcome: Not Progressing Goal: Will remain free from falls Outcome: Not Progressing   Problem: Bowel/Gastric: Goal: Gastrointestinal status for postoperative course will improve Outcome: Not Progressing   Problem: Clinical Measurements: Goal: Ability to maintain clinical measurements within normal limits will improve Outcome: Not Progressing Goal: Postoperative complications will be avoided or minimized Outcome: Not Progressing Goal: Diagnostic test results will improve Outcome: Not Progressing   Problem: Pain  Management: Goal: Pain level will decrease Outcome: Not Progressing   Problem: Skin Integrity: Goal: Will show signs of wound healing Outcome: Not Progressing   Problem: Health Behavior/Discharge Planning: Goal: Identification of resources available to assist in meeting health care needs will improve Outcome: Not Progressing   Problem: Bladder/Genitourinary: Goal: Urinary functional status for postoperative course will improve Outcome: Not Progressing

## 2023-09-05 NOTE — Progress Notes (Signed)
Physical Therapy Treatment Patient Details Name: Curtis Hartman MRN: [id number] DOB: [birth year] Today's Date: 09/05/2023   History of Present Illness Pt is a 62 y.o. male s/p DLIF 09/01/23. PMH: s/p ACDF (Jan 2024), OA, HTN    PT Comments  Pt received in supine and agreeable to session. Pt requires increased cues for safety due to some impulsivity during mobility tasks. Pt able to tolerate two short gait trials in the room with dense cues for technique. Pt demonstrating some impaired coordination with BLE during ambulation and reports BUE numbness. Pt limited by pain and fatigue. Pt continues to benefit from PT services to progress toward functional mobility goals.    If plan is discharge home, recommend the following: A little help with walking and/or transfers;A little help with bathing/dressing/bathroom;Assistance with cooking/housework;Assist for transportation;Help with stairs or ramp for entrance   Can travel by private vehicle        Equipment Recommendations  Rolling walker (2 wheels)    Recommendations for Other Services       Precautions / Restrictions Precautions Precautions: Back;Other (comment) Precaution Comments: foley Required Braces or Orthoses: Spinal Brace Spinal Brace: Lumbar corset;Applied in sitting position Restrictions Weight Bearing Restrictions: No     Mobility  Bed Mobility Overal bed mobility: Needs Assistance Bed Mobility: Rolling, Sit to Sidelying Rolling: Contact guard assist Sidelying to sit: Used rails, Min assist       General bed mobility comments: min A for trunk elevation    Transfers Overall transfer level: Needs assistance Equipment used: Rolling walker (2 wheels) Transfers: Sit to/from Stand Sit to Stand: Min assist           General transfer comment: cues for hand placement and min A for power up    Ambulation/Gait Ambulation/Gait assistance: Min assist Gait Distance (Feet): 30 Feet (x2) Assistive device: Rolling  walker (2 wheels) Gait Pattern/deviations: Step-through pattern, Decreased stride length, Trunk flexed       General Gait Details: Pt walking with increased cadence during first trial with RW pushed out in front despite cues and demonstrates impaired coordination and increased instability.  Dense cues for decreased cadence and RW proximity during second trial with pt demonstrating improvement.       Balance Overall balance assessment: Needs assistance Sitting-balance support: No upper extremity supported, Feet supported Sitting balance-Leahy Scale: Fair Sitting balance - Comments: sitting on EOB   Standing balance support: Bilateral upper extremity supported, During functional activity, Reliant on assistive device for balance Standing balance-Leahy Scale: Poor Standing balance comment: with RW support                            Cognition Arousal: Alert Behavior During Therapy: WFL for tasks assessed/performed Overall Cognitive Status: Within Functional Limits for tasks assessed                                 General Comments: increased cues for safety at times        Exercises      General Comments        Pertinent Vitals/Pain Pain Assessment Pain Assessment: Faces Faces Pain Scale: Hurts even more Pain Location: back Pain Descriptors / Indicators: Grimacing, Guarding, Sore Pain Intervention(s): Monitored during session, Repositioned     PT Goals (current goals can now be found in the care plan section) Acute Rehab PT Goals Patient Stated Goal: home PT Goal  Formulation: With patient Time For Goal Achievement: 09/17/23 Progress towards PT goals: Progressing toward goals    Frequency    Min 5X/week       AM-PAC PT "6 Clicks" Mobility   Outcome Measure  Help needed turning from your back to your side while in a flat bed without using bedrails?: A Little Help needed moving from lying on your back to sitting on the side of a flat  bed without using bedrails?: A Little Help needed moving to and from a bed to a chair (including a wheelchair)?: A Little Help needed standing up from a chair using your arms (e.g., wheelchair or bedside chair)?: A Little Help needed to walk in hospital room?: A Little Help needed climbing 3-5 steps with a railing? : A Lot 6 Click Score: 17    End of Session Equipment Utilized During Treatment: Gait belt;Back brace Activity Tolerance: Patient tolerated treatment well Patient left: in chair;with call bell/phone within reach Nurse Communication: Mobility status PT Visit Diagnosis: Other abnormalities of gait and mobility (R26.89);Muscle weakness (generalized) (M62.81);Pain     Time: 6295-2841 PT Time Calculation (min) (ACUTE ONLY): 15 min  Charges:    $Gait Training: 8-22 mins PT General Charges $$ ACUTE PT VISIT: 1 Visit                     Johny Shock, PTA Acute Rehabilitation Services Secure Chat Preferred  [phone]    Johny Shock 09/05/2023, 2:17 PM

## 2023-09-05 NOTE — Progress Notes (Signed)
Neurosurgery Service Progress Note  Subjective: No acute events overnight, R psoas pain otherwise no complaints   Objective: Vitals:   09/04/23 1927 09/05/23 0622 09/05/23 0736 09/05/23 0832  BP: 127/69 137/73  (!) 155/80  Pulse: 78 78 76 81  Resp:   17 16  Temp: 98.9 F (37.2 C) 98.2 F (36.8 C)  98.2 F (36.8 C)  TempSrc: Oral Oral  Oral  SpO2: 99% 99% 98% 100%  Weight:      Height:        Physical Exam: Strength 5/5 x4 except R psoas / HF weakness with pain during hip flexion, SILTx4, R flank / lumbar incisions c/d/I with dressings in place  Assessment & Plan: 62 y.o. man s/p 3 level DLIF + perc screws.  -discussed that psoas weakness is expected / approach related and should improve -nephro signed off -medicine recs -if medically stable, possible discharge tomorrow -SCDs/TEDs/SQH  Jadene Pierini  09/05/23 9:52 AM

## 2023-09-06 DIAGNOSIS — N179 Acute kidney failure, unspecified: Secondary | ICD-10-CM | POA: Diagnosis not present

## 2023-09-06 DIAGNOSIS — M48061 Spinal stenosis, lumbar region without neurogenic claudication: Secondary | ICD-10-CM | POA: Diagnosis not present

## 2023-09-06 DIAGNOSIS — R338 Other retention of urine: Secondary | ICD-10-CM | POA: Diagnosis not present

## 2023-09-06 LAB — BASIC METABOLIC PANEL
Anion gap: 10 (ref 5–15)
BUN: 82 mg/dL — ABNORMAL HIGH (ref 8–23)
CO2: 20 mmol/L — ABNORMAL LOW (ref 22–32)
Calcium: 8 mg/dL — ABNORMAL LOW (ref 8.9–10.3)
Chloride: 110 mmol/L (ref 98–111)
Creatinine, Ser: 5.19 mg/dL — ABNORMAL HIGH (ref 0.61–1.24)
GFR, Estimated: 12 mL/min — ABNORMAL LOW (ref >60)
Glucose, Bld: 128 mg/dL — ABNORMAL HIGH (ref 70–99)
Potassium: 3.8 mmol/L (ref 3.5–5.1)
Sodium: 140 mmol/L (ref 135–145)

## 2023-09-06 LAB — GLUCOSE, CAPILLARY
Glucose-Capillary: 153 mg/dL — ABNORMAL HIGH (ref 70–99)
Glucose-Capillary: 113 mg/dL — ABNORMAL HIGH (ref 70–99)
Glucose-Capillary: 170 mg/dL — ABNORMAL HIGH (ref 70–99)
Glucose-Capillary: 78 mg/dL (ref 70–99)

## 2023-09-06 MED ORDER — SODIUM CHLORIDE 0.9 % IV BOLUS
500.0000 mL | Freq: Once | INTRAVENOUS | Status: AC
  Administered 2023-09-06: 500 mL via INTRAVENOUS

## 2023-09-06 NOTE — Progress Notes (Signed)
Neurosurgery Service Progress Note  Subjective: No acute events overnight. R psoas pain and overall aching from having to use his UE to help him get up and down.    Objective: Vitals:   09/05/23 2004 09/06/23 0426 09/06/23 0818 09/06/23 0827  BP: 122/66 130/66  (!) 151/79  Pulse: 86 81  93  Resp: 18 18  17   Temp: 99.3 F (37.4 C)   98.2 F (36.8 C)  TempSrc: Oral   Oral  SpO2: 100% 100% 100% 100%  Weight:      Height:        Physical Exam: Strength 5/5 x4 except R psoas / HF weakness with pain during hip flexion, SILTx4, R flank / lumbar incisions c/d/I with dressings in place   Assessment & Plan: 63 y.o. man s/p 3 level DLIF + perc screws.   -continue PT/OT -nephro signed off -medicine recs -SCDs/TEDs/SQH  Emilee Hero, PA-C 09/06/23 9:03 AM

## 2023-09-06 NOTE — Progress Notes (Addendum)
PROGRESS NOTE    Curtis Hartman  WUJ:[id number] DOB: January 21, 1961 DOA: 09/01/2023 PCP: Wilmon Pali, FNP   Brief Narrative:  Patient admitted under ortho for lumbar stenosis s/p L2-5 DLIF, percutaneous instrumentation.  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, FSGS, and diabetes mellitus -Exacerbated by urinary retention possible peri-operative hypovolemia and hypertensive urgency -Nephrology previously following - signed off - have asked them to re-evaluate the patient this week -500cc bolus x1 today to see if UOP will respond appropriately -Foley placed with large urine output -urine output remains appropriate  Urinary retention -Postoperatively, Foley catheter inserted with immediate 900 cc urine output -Continue Flomax, voiding trial when stable in the next 24 to 48 hours   Diabetes mellitus, insulin dependent - moderately well controlled -Continue sliding scale -Resume home regimen at discharge   Lumbar stenosis -This post percutaneous instrumentation, postop day #2, management per primary neurosurgical team -Avoid morphine in the setting of AKI- currently on oxycodone, Dilaudid -Consider decreasing gabapentin dose if he becomes encephalopathic.  DVT prophylaxis: heparin injection 5,000 Units Start: 09/04/23 1715 SCD's Start: 09/01/23 1935   Code Status:   Code Status: Full  Code  Family Communication: None present  Status is: Inpt  Dispo: The patient is from: Home              Anticipated d/c is to: Per primary              Anticipated d/c date is: Per primary  Consultants:  We are, nephro  Subjective: No acute issues/events overnight -reports ongoing pain in the lumbar postsurgical area but improving from prior, denies nausea vomiting diarrhea constipation headache fevers chills or chest pain  Objective: Vitals:   09/05/23 2004 09/06/23 0426 09/06/23 0818 09/06/23 0827  BP: 122/66 130/66  (!) 151/79  Pulse: 86 81  93  Resp: 18 18  17   Temp: 99.3 F (37.4 C)   98.2 F (36.8 C)  TempSrc: Oral   Oral  SpO2: 100% 100% 100% 100%  Weight:      Height:        Intake/Output Summary (Last 24 hours) at 09/06/2023 1314 Last data filed at 09/06/2023 0831 Gross per 24 hour  Intake 483 ml  Output 1700 ml  Net -1217 ml   Filed Weights   09/01/23 0819  Weight: 71.2 kg    Examination:  General:  Pleasantly resting in bed, No acute distress. HEENT:  Normocephalic atraumatic.  Sclerae nonicteric, noninjected.  Extraocular movements intact bilaterally. Neck:  Without mass or deformity.  Trachea is midline. Lungs:  Clear to auscultate bilaterally without rhonchi, wheeze, or rales. Heart:  Regular rate and rhythm.  Without murmurs, rubs, or gallops. Abdomen:  Soft, nontender, nondistended.  Without guarding or rebound. Extremities: Without cyanosis, clubbing, edema, or obvious deformity. Skin:  Warm and dry, no erythema.   Data Reviewed: I have personally reviewed following labs and imaging studies  CBC: Recent Labs  Lab 09/01/23 1309 09/01/23 1404 09/01/23  1552 09/02/23 0839 09/04/23 0737  WBC  --   --   --  10.7* 11.6*  HGB 8.5* 8.5* 9.2* 8.9* 8.0*  HCT 25.0* 25.0* 27.0* 27.2* 24.6*  MCV  --   --   --  90.4 89.8  PLT  --   --   --  276 243   Basic Metabolic Panel: Recent Labs  Lab 09/02/23 0839 09/03/23 0837 09/04/23 0737  09/05/23 0322 09/06/23 0317  NA 143 140 139 137 140  K 4.2 3.7 3.4* 3.7 3.8  CL 110 110 110 104 110  CO2 18* 18* 20* 19* 20*  GLUCOSE 162* 186* 143* 127* 128*  BUN 85* 75* 71* 76* 82*  CREATININE 4.84* 4.96* 4.92* 4.85* 5.19*  CALCIUM 8.3* 8.5* 8.0* 8.1* 8.0*   GFR: Estimated Creatinine Clearance: 14.8 mL/min (A) (by C-G formula based on SCr of 5.19 mg/dL (H)).  CBG: Recent Labs  Lab 09/05/23 1156 09/05/23 1712 09/05/23 2140 09/06/23 0827 09/06/23 1158  GLUCAP 136* 196* 67* 153* 113*   No results found for this or any previous visit (from the past 240 hour(s)).        Radiology Studies: No results found.      Scheduled Meds:  amLODipine  10 mg Oral Daily   calcitRIOL  0.25 mcg Oral Once per day on Monday Wednesday Friday   calcium acetate  667 mg Oral BID WC   carvedilol  3.125 mg Oral BID WC   Chlorhexidine Gluconate Cloth  6 each Topical Daily   docusate sodium  100 mg Oral BID   fluticasone furoate-vilanterol  1 puff Inhalation Daily   And   umeclidinium bromide  1 puff Inhalation Daily   gabapentin  400 mg Oral BID   heparin injection (subcutaneous)  5,000 Units Subcutaneous Q8H   insulin aspart  0-15 Units Subcutaneous TID WC   insulin aspart  0-5 Units Subcutaneous QHS   sodium bicarbonate  650 mg Oral TID   sodium chloride flush  3 mL Intravenous Q12H   tamsulosin  0.4 mg Oral Daily   Continuous Infusions:   LOS: 5 days    Time spent:    Azucena Fallen, DO [facility]  If 7PM-7AM, please contact night-coverage [email or web address]  09/06/2023, 1:14 PM

## 2023-09-07 DIAGNOSIS — R338 Other retention of urine: Secondary | ICD-10-CM | POA: Diagnosis not present

## 2023-09-07 DIAGNOSIS — M48061 Spinal stenosis, lumbar region without neurogenic claudication: Secondary | ICD-10-CM | POA: Diagnosis not present

## 2023-09-07 DIAGNOSIS — N179 Acute kidney failure, unspecified: Secondary | ICD-10-CM | POA: Diagnosis not present

## 2023-09-07 LAB — GLUCOSE, CAPILLARY
Glucose-Capillary: 109 mg/dL — ABNORMAL HIGH (ref 70–99)
Glucose-Capillary: 168 mg/dL — ABNORMAL HIGH (ref 70–99)
Glucose-Capillary: 107 mg/dL — ABNORMAL HIGH (ref 70–99)
Glucose-Capillary: 170 mg/dL — ABNORMAL HIGH (ref 70–99)

## 2023-09-07 LAB — BASIC METABOLIC PANEL
Anion gap: 10 (ref 5–15)
BUN: 81 mg/dL — ABNORMAL HIGH (ref 8–23)
CO2: 19 mmol/L — ABNORMAL LOW (ref 22–32)
Calcium: 8 mg/dL — ABNORMAL LOW (ref 8.9–10.3)
Chloride: 110 mmol/L (ref 98–111)
Creatinine, Ser: 5.03 mg/dL — ABNORMAL HIGH (ref 0.61–1.24)
GFR, Estimated: 12 mL/min — ABNORMAL LOW (ref >60)
Glucose, Bld: 121 mg/dL — ABNORMAL HIGH (ref 70–99)
Potassium: 4 mmol/L (ref 3.5–5.1)
Sodium: 139 mmol/L (ref 135–145)

## 2023-09-07 NOTE — Progress Notes (Signed)
Occupational Therapy Treatment Patient Details Name: Curtis Hartman MRN: [id number] DOB: September 04, 1961 Today's Date: 09/07/2023   History of present illness Pt is a 62 y.o. male s/p DLIF 09/01/23. PMH: s/p ACDF (Jan 2024), OA, HTN   OT comments  Patient supine in bed, remains limited by back pain.  Min cueing to adhere to precautions during session.  Supervision for bed mobility, min to min guard for transfers using RW.  Educated and provided AE for LB dressing, but will need continued practice.  Updated dc plan to >3hrs therapy at inpatient setting in order to optimize tolerance, safety and independence prior to dc home.       If plan is discharge home, recommend the following:  A lot of help with bathing/dressing/bathroom;Assistance with cooking/housework;Assist for transportation;A lot of help with walking and/or transfers   Equipment Recommendations  None recommended by OT    Recommendations for Other Services      Precautions / Restrictions Precautions Precautions: Back;Other (comment) Precaution Booklet Issued: Yes (comment) Precaution Comments: foley Required Braces or Orthoses: Spinal Brace Spinal Brace: Lumbar corset;Applied in sitting position Restrictions Weight Bearing Restrictions: No       Mobility Bed Mobility Overal bed mobility: Needs Assistance Bed Mobility: Sidelying to Sit, Rolling Rolling: Supervision Sidelying to sit: Supervision       General bed mobility comments: using rail, min guard for safety. good technique and adherance to back precautions    Transfers Overall transfer level: Needs assistance Equipment used: Rolling walker (2 wheels) Transfers: Sit to/from Stand Sit to Stand: Min assist, Contact guard assist           General transfer comment: cueing for posture and technique, min assist from EOB and min guard from recliner.     Balance Overall balance assessment: Needs assistance Sitting-balance support: No upper extremity  supported, Feet supported Sitting balance-Leahy Scale: Fair     Standing balance support: Bilateral upper extremity supported, During functional activity, Single extremity supported Standing balance-Leahy Scale: Poor Standing balance comment: BUE support on RW                           ADL either performed or assessed with clinical judgement   ADL Overall ADL's : Needs assistance/impaired     Grooming: Set up;Sitting           Upper Body Dressing : Minimal assistance;Sitting Upper Body Dressing Details (indicate cue type and reason): brace mgmt Lower Body Dressing: Sit to/from stand;Moderate assistance Lower Body Dressing Details (indicate cue type and reason): educated on sock aide and reacher for LB dressing, min assist to don/doff socks and increased time. would benefit from further training Toilet Transfer: Minimal assistance;Contact guard assist;Ambulation;Rolling walker (2 wheels) Toilet Transfer Details (indicate cue type and reason): from EOB min A, min guard from recliner with B arm rests         Functional mobility during ADLs: Contact guard assist;Rolling walker (2 wheels);Cueing for safety      Extremity/Trunk Assessment              Vision       Perception     Praxis      Cognition Arousal: Alert Behavior During Therapy: WFL for tasks assessed/performed Overall Cognitive Status: Within Functional Limits for tasks assessed  Exercises      Shoulder Instructions       General Comments      Pertinent Vitals/ Pain       Pain Assessment Pain Assessment: Faces Faces Pain Scale: Hurts even more Pain Location: back Pain Descriptors / Indicators: Grimacing, Guarding, Sore Pain Intervention(s): Limited activity within patient's tolerance, Monitored during session, Repositioned  Home Living                                          Prior  Functioning/Environment              Frequency  Min 1X/week        Progress Toward Goals  OT Goals(current goals can now be found in the care plan section)  Progress towards OT goals: Progressing toward goals (slowly)  Acute Rehab OT Goals Patient Stated Goal: less pain OT Goal Formulation: With patient Time For Goal Achievement: 09/17/23 Potential to Achieve Goals: Good  Plan      Co-evaluation                 AM-PAC OT "6 Clicks" Daily Activity     Outcome Measure   Help from another person eating meals?: A Little Help from another person taking care of personal grooming?: A Little Help from another person toileting, which includes using toliet, bedpan, or urinal?: A Lot Help from another person bathing (including washing, rinsing, drying)?: A Lot Help from another person to put on and taking off regular upper body clothing?: A Little Help from another person to put on and taking off regular lower body clothing?: A Lot 6 Click Score: 15    End of Session Equipment Utilized During Treatment: Rolling walker (2 wheels);Back brace  OT Visit Diagnosis: Other abnormalities of gait and mobility (R26.89);Muscle weakness (generalized) (M62.81);Pain Pain - part of body:  (back)   Activity Tolerance Patient tolerated treatment well   Patient Left in chair;with call bell/phone within reach;with chair alarm set   Nurse Communication Mobility status        Time: 8841-6606 OT Time Calculation (min): 25 min  Charges: OT General Charges $OT Visit: 1 Visit OT Treatments $Self Care/Home Management : 23-37 mins  Barry Brunner, OT Acute Rehabilitation Services Office [phone]   Chancy Milroy 09/07/2023, 12:10 PM

## 2023-09-07 NOTE — Progress Notes (Signed)
PROGRESS NOTE    Curtis Hartman  ZOX:[id number] DOB: 07/02/1961 DOA: 09/01/2023 PCP: Wilmon Pali, FNP   Brief Narrative:  Patient admitted under ortho for lumbar stenosis s/p L2-5 DLIF, percutaneous instrumentation.  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, FSGS, and diabetes mellitus -Exacerbated by urinary retention possible peri-operative hypovolemia and hypertensive urgency -Nephrology asked to re-evaluate the patient this week -500cc bolus x1 11/3 with little/no response -Foley placed with large urine output -urine output remains appropriate  Urinary retention -Postoperatively, Foley catheter inserted with immediate 900 cc urine output -Continue Flomax, voiding trial when stable and kidney function begins to return to baseline - hopefully in the next 24 to 48 hours   Diabetes mellitus, insulin dependent - moderately well controlled -Continue sliding scale -Resume home regimen at discharge   Lumbar stenosis -This post percutaneous instrumentation, postop day #2, management per primary neurosurgical team -Avoid morphine in the setting of AKI- currently on oxycodone, Dilaudid -Consider decreasing gabapentin dose if he becomes encephalopathic.  DVT prophylaxis: heparin injection 5,000 Units Start: 09/04/23 1715 SCD's Start: 09/01/23 1935   Code Status:   Code Status: Full  Code  Family Communication: None present  Status is: Inpt  Dispo: The patient is from: Home              Anticipated d/c is to: Per primary              Anticipated d/c date is: Per primary  Consultants:  We are, nephro  Subjective: No acute issues/events overnight -reports ongoing pain in the lumbar postsurgical area but improving from prior, denies nausea vomiting diarrhea constipation headache fevers chills or chest pain  Objective: Vitals:   09/06/23 1700 09/06/23 1929 09/07/23 0530 09/07/23 0718  BP: 131/73 138/73 127/74 (!) 142/71  Pulse: 82 84 81 81  Resp:      Temp:  99.3 F (37.4 C) 99 F (37.2 C) 98.9 F (37.2 C)  TempSrc:  Oral Oral Oral  SpO2:  100% 99% 100%  Weight:      Height:        Intake/Output Summary (Last 24 hours) at 09/07/2023 0729 Last data filed at 09/07/2023 0500 Gross per 24 hour  Intake 720 ml  Output 1100 ml  Net -380 ml   Filed Weights   09/01/23 0819  Weight: 71.2 kg    Examination:  General:  Pleasantly resting in bed, No acute distress. HEENT:  Normocephalic atraumatic.  Sclerae nonicteric, noninjected.  Extraocular movements intact bilaterally. Neck:  Without mass or deformity.  Trachea is midline. Lungs:  Clear to auscultate bilaterally without rhonchi, wheeze, or rales. Heart:  Regular rate and rhythm.  Without murmurs, rubs, or gallops. Abdomen:  Soft, nontender, nondistended.  Without guarding or rebound. Extremities: Without cyanosis, clubbing, edema, or obvious deformity. Skin:  Warm and dry, no erythema.   Data Reviewed: I have personally reviewed following labs and imaging studies  CBC: Recent Labs  Lab 09/01/23 1309 09/01/23 1404  09/01/23 1552 09/02/23 0839 09/04/23 0737  WBC  --   --   --  10.7* 11.6*  HGB 8.5* 8.5* 9.2* 8.9* 8.0*  HCT 25.0* 25.0* 27.0* 27.2* 24.6*  MCV  --   --   --  90.4 89.8  PLT  --   --   --  276 243   Basic Metabolic Panel: Recent Labs  Lab 09/02/23 0839 09/03/23 0837  09/04/23 0737 09/05/23 0322 09/06/23 0317  NA 143 140 139 137 140  K 4.2 3.7 3.4* 3.7 3.8  CL 110 110 110 104 110  CO2 18* 18* 20* 19* 20*  GLUCOSE 162* 186* 143* 127* 128*  BUN 85* 75* 71* 76* 82*  CREATININE 4.84* 4.96* 4.92* 4.85* 5.19*  CALCIUM 8.3* 8.5* 8.0* 8.1* 8.0*   GFR: Estimated Creatinine Clearance: 14.8 mL/min (A) (by C-G formula based on SCr of 5.19 mg/dL (H)).  CBG: Recent Labs  Lab 09/06/23 0827 09/06/23 1158 09/06/23 1659 09/06/23 2121 09/07/23 0719  GLUCAP 153* 113* 170* 78 109*   No results found for this or any previous visit (from the past 240 hour(s)).   Radiology Studies: No results found.  Scheduled Meds:  amLODipine  10 mg Oral Daily   calcitRIOL  0.25 mcg Oral Once per day on Monday Wednesday Friday   calcium acetate  667 mg Oral BID WC   carvedilol  3.125 mg Oral BID WC   Chlorhexidine Gluconate Cloth  6 each Topical Daily   docusate sodium  100 mg Oral BID   fluticasone furoate-vilanterol  1 puff Inhalation Daily   And   umeclidinium bromide  1 puff Inhalation Daily   gabapentin  400 mg Oral BID   heparin injection (subcutaneous)  5,000 Units Subcutaneous Q8H   insulin aspart  0-15 Units Subcutaneous TID WC   insulin aspart  0-5 Units Subcutaneous QHS   sodium bicarbonate  650 mg Oral TID   sodium chloride flush  3 mL Intravenous Q12H   tamsulosin  0.4 mg Oral Daily   Continuous Infusions:   LOS: 6 days    Time spent:    Azucena Fallen, DO [facility]  If 7PM-7AM, please contact night-coverage [email or web address]  09/07/2023, 7:29 AM

## 2023-09-07 NOTE — Progress Notes (Signed)
Inpatient Rehab Admissions Coordinator Note:   Per updated PT recommendations patient was screened for CIR candidacy by Stephania Fragmin, PT. At this time, pt appears to be a potential candidate for CIR. I will place an order for rehab consult for full assessment, per our protocol.  Please contact me any with questions.Estill Dooms, PT, DPT [phone] 09/07/23 4:29 PM

## 2023-09-07 NOTE — Progress Notes (Signed)
Physical Therapy Treatment Patient Details Name: Curtis Hartman MRN: [id number] DOB: 1961/02/21 Today's Date: 09/07/2023   History of Present Illness Pt is a 62 y.o. male s/p DLIF L2-5 on 09/01/23. PMH: s/p ACDF (Jan 2024), OA, HTN    PT Comments  The pt was able to make good progress with mobility this afternoon, but continues to need at least minA for transfers and ambulation, and is limited to ~50 ft prior to urgently needing seated rest due to fatigue in BLE. The pt relies heavily on RW for UE support, and had no instances of knee buckling, but does maintain trunk flexion and bilateral knee flexion while walking, needing max cues to correct and maintain. Pt reports his sister lives with him and can provide some support after d/c, but pt hopeful to return to full independence and improve balance prior to return home. His is at great risk of falls at this time due to poor strength, coordination in bilateral LE, elevated pain, and poor safety awareness. Recommend intensive therapies after d/c to reduce risk of falls and fall-related injuries after surgery.     If plan is discharge home, recommend the following: A little help with walking and/or transfers;A little help with bathing/dressing/bathroom;Assistance with cooking/housework;Assist for transportation;Help with stairs or ramp for entrance;Supervision due to cognitive status   Can travel by private vehicle        Equipment Recommendations  Rolling walker (2 wheels)    Recommendations for Other Services Rehab consult     Precautions / Restrictions Precautions Precautions: Back;Other (comment) Precaution Booklet Issued: Yes (comment) Precaution Comments: foley Required Braces or Orthoses: Spinal Brace Spinal Brace: Lumbar corset;Applied in sitting position Restrictions Weight Bearing Restrictions: No     Mobility  Bed Mobility Overal bed mobility: Needs Assistance             General bed mobility comments: pt OOB in  recliner at start and end of session    Transfers Overall transfer level: Needs assistance Equipment used: Rolling walker (2 wheels) Transfers: Sit to/from Stand Sit to Stand: Min assist           General transfer comment: cueing for posture and technique, min assist from recliner.    Ambulation/Gait Ambulation/Gait assistance: Min assist Gait Distance (Feet): 55 Feet (+ 25 ft) Assistive device: Rolling walker (2 wheels) Gait Pattern/deviations: Step-through pattern, Decreased stride length, Trunk flexed, Decreased dorsiflexion - right, Decreased dorsiflexion - left, Knee flexed in stance - right, Knee flexed in stance - left Gait velocity: decreased Gait velocity interpretation: <1.31 ft/sec, indicative of household ambulator   General Gait Details: pt with slow gait and increased ankle strategies with each step. dependent on UE support and poor acceptance of cues for posture and positioning in RW. pt reluctant to accept cues through session. poor activity tolerance and poor insight to limited activity tolerance. assist for balance, especially when turning       Balance Overall balance assessment: Needs assistance Sitting-balance support: No upper extremity supported, Feet supported Sitting balance-Leahy Scale: Fair     Standing balance support: Bilateral upper extremity supported, During functional activity, Single extremity supported Standing balance-Leahy Scale: Poor Standing balance comment: BUE support on RW                            Cognition Arousal: Alert Behavior During Therapy: WFL for tasks assessed/performed Overall Cognitive Status: Impaired/Different from baseline Area of Impairment: Following commands, Safety/judgement, Awareness, Problem solving  Following Commands: Follows one step commands inconsistently Safety/Judgement: Decreased awareness of safety, Decreased awareness of deficits   Problem Solving:  Difficulty sequencing, Requires verbal cues General Comments: pt needing increased cues for technique and safety. poor problem solving and understanding of PT education.        Exercises General Exercises - Lower Extremity Long Arc Quad: AROM, Both, 10 reps, Seated Hip Flexion/Marching: AROM, Both, 10 reps, Seated Heel Raises: AROM, Both, 15 reps, Seated    General Comments General comments (skin integrity, edema, etc.): VSS on RA, pt asking about oging to rehab after d/c      Pertinent Vitals/Pain Pain Assessment Pain Assessment: Faces Faces Pain Scale: Hurts even more Pain Location: back Pain Descriptors / Indicators: Grimacing, Guarding, Sore Pain Intervention(s): Premedicated before session, Limited activity within patient's tolerance, Monitored during session, Repositioned     PT Goals (current goals can now be found in the care plan section) Acute Rehab PT Goals Patient Stated Goal: to improve walking and return home PT Goal Formulation: With patient Time For Goal Achievement: 09/17/23 Potential to Achieve Goals: Good Progress towards PT goals: Progressing toward goals    Frequency    Min 5X/week       AM-PAC PT "6 Clicks" Mobility   Outcome Measure  Help needed turning from your back to your side while in a flat bed without using bedrails?: A Little Help needed moving from lying on your back to sitting on the side of a flat bed without using bedrails?: A Little Help needed moving to and from a bed to a chair (including a wheelchair)?: A Little Help needed standing up from a chair using your arms (e.g., wheelchair or bedside chair)?: A Little (mod cues) Help needed to walk in hospital room?: A Lot (max cues) Help needed climbing 3-5 steps with a railing? : A Lot 6 Click Score: 16    End of Session Equipment Utilized During Treatment: Gait belt;Back brace Activity Tolerance: Patient tolerated treatment well Patient left: in chair;with call bell/phone within  reach Nurse Communication: Mobility status PT Visit Diagnosis: Other abnormalities of gait and mobility (R26.89);Muscle weakness (generalized) (M62.81);Pain Pain - part of body:  (back)     Time: 1610-9604 PT Time Calculation (min) (ACUTE ONLY): 30 min  Charges:    $Gait Training: 8-22 mins $Therapeutic Exercise: 8-22 mins PT General Charges $$ ACUTE PT VISIT: 1 Visit                     Vickki Muff, PT, DPT   Acute Rehabilitation Department Office [phone] Secure Chat Communication Preferred   Ronnie Derby 09/07/2023, 3:23 PM

## 2023-09-07 NOTE — Progress Notes (Signed)
s/p DLIF 09/01/23    09/07/23 1457  TOC Brief Assessment  Insurance and Status Reviewed  Patient has primary care physician Yes  Home environment has been reviewed From home with sister and sister's boyfriend  Prior level of function: PTA independent with ADL'S , uses rollator with ambulation  Prior/Current Home Services No current home services  Social Determinants of Health Reivew SDOH reviewed no interventions necessary  Readmission risk has been reviewed No  Transition of care needs transition of care needs identified, TOC will continue to follow   Nephrology following , pt with worsening AKI on CKD4.   TOC following and will assist with TOC need

## 2023-09-07 NOTE — Progress Notes (Signed)
    Providing Compassionate, Quality Care - Together   NEUROSURGERY PROGRESS NOTE     S: No issues overnight.    O: EXAM:  BP (!) 142/71 (BP Location: Left Arm)   Pulse 81   Temp 98.9 F (37.2 C) (Oral)   Resp 14   Ht 5\' 9"  (1.753 m)   Wt 71.2 kg   SpO2 100%   BMI 23.18 kg/m     Awake, alert, oriented  Speech fluent, appropriate  MAEs    ASSESSMENT:  62 y.o. male s/p L2-5 DLIF, percutaneous instrumentation    PLAN: -Appreciate TRH/Nephrology recs -Therapies as tolerated -Call w/ questions/concerns.   Curtis Hartman, [facility]

## 2023-09-07 NOTE — Progress Notes (Signed)
PT Cancellation Note  Patient Details Name: Curtis Hartman MRN: [id number] DOB: [birth year]   Cancelled Treatment:    Reason Eval/Treat Not Completed: Pain limiting ability to participate this morning despite pre-medication by RN at 9:30am. Pt requests PT return after next dose of pain medication around 2:00pm.   Vickki Muff, PT, DPT   Acute Rehabilitation Department Office [phone] Secure Chat Communication Preferred   Ronnie Derby 09/07/2023, 11:20 AM

## 2023-09-08 ENCOUNTER — Inpatient Hospital Stay (HOSPITAL_COMMUNITY): Payer: Medicaid Other

## 2023-09-08 DIAGNOSIS — M48062 Spinal stenosis, lumbar region with neurogenic claudication: Secondary | ICD-10-CM | POA: Diagnosis not present

## 2023-09-08 DIAGNOSIS — N185 Chronic kidney disease, stage 5: Secondary | ICD-10-CM

## 2023-09-08 DIAGNOSIS — M48061 Spinal stenosis, lumbar region without neurogenic claudication: Secondary | ICD-10-CM | POA: Diagnosis not present

## 2023-09-08 DIAGNOSIS — N184 Chronic kidney disease, stage 4 (severe): Secondary | ICD-10-CM

## 2023-09-08 LAB — BASIC METABOLIC PANEL
Anion gap: 8 (ref 5–15)
BUN: 83 mg/dL — ABNORMAL HIGH (ref 8–23)
CO2: 21 mmol/L — ABNORMAL LOW (ref 22–32)
Calcium: 8.1 mg/dL — ABNORMAL LOW (ref 8.9–10.3)
Chloride: 107 mmol/L (ref 98–111)
Creatinine, Ser: 5.11 mg/dL — ABNORMAL HIGH (ref 0.61–1.24)
GFR, Estimated: 12 mL/min — ABNORMAL LOW (ref >60)
Glucose, Bld: 115 mg/dL — ABNORMAL HIGH (ref 70–99)
Potassium: 4.6 mmol/L (ref 3.5–5.1)
Sodium: 136 mmol/L (ref 135–145)

## 2023-09-08 LAB — GLUCOSE, CAPILLARY
Glucose-Capillary: 119 mg/dL — ABNORMAL HIGH (ref 70–99)
Glucose-Capillary: 134 mg/dL — ABNORMAL HIGH (ref 70–99)
Glucose-Capillary: 157 mg/dL — ABNORMAL HIGH (ref 70–99)
Glucose-Capillary: 163 mg/dL — ABNORMAL HIGH (ref 70–99)

## 2023-09-08 MED ORDER — SODIUM CHLORIDE 0.45 % IV SOLN
INTRAVENOUS | Status: DC
  Filled 2023-09-08 (×2): qty 75

## 2023-09-08 NOTE — Progress Notes (Signed)
PROGRESS NOTE    Curtis Hartman  WUJ:[id number] DOB: Sep 15, 1961 DOA: 09/01/2023 PCP: Wilmon Pali, FNP   Brief Narrative:  Patient admitted under ortho for lumbar stenosis s/p L2-5 DLIF, percutaneous instrumentation.  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  Assessment & Plan:   Principal Problem:   Lumbar spinal stenosis  Hypertensive emergency, resolved -With worsening AKI on CKD4 -Improved on current regimen - amlodipine/carvedilol -PRN hydralazine ongoing -Likely exacerbated by post-op pain   AKI on CKD stage IV -Nephrology following - concern patient is approaching ESRD/HD - will discuss with vascular surgery for vein mapping in expectation for possible AVF creating in the future. -Fluid challenge previously with little/no response -Foley placed with large urine output -urine output remains appropriate -With noted proteinuria, likely secondary to hypertension, FSGS, and diabetes mellitus -Exacerbated by urinary retention possible peri-operative hypovolemia and hypertensive urgency  Urinary retention -Postoperatively, Foley catheter inserted with immediate 900 cc urine output -Continue Flomax, voiding trial when stable and kidney function begins to return to baseline - hopefully in the next 24 to 48 hours   Diabetes mellitus, insulin dependent - moderately well controlled -Continue sliding scale -Resume home regimen at discharge   Lumbar stenosis -This post percutaneous instrumentation, postop day #2, management per primary neurosurgical team -Avoid morphine in the setting of AKI- currently on oxycodone, Dilaudid -Consider decreasing gabapentin dose if he becomes encephalopathic.  DVT prophylaxis:  heparin injection 5,000 Units Start: 09/04/23 1715 SCD's Start: 09/01/23 1935   Code Status:   Code Status: Full Code  Family Communication: None present  Status is: Inpt  Dispo: The patient is from: Home              Anticipated d/c is to: Per primary pending CIR approval              Anticipated d/c date is: Per primary  Consultants:  We are, nephro  Subjective: No acute issues/events overnight, pain improving - excited to be able to ambulate with PT yesterday, denies nausea vomiting diarrhea constipation headache fevers chills or chest pain  Objective: Vitals:   09/07/23 1511 09/07/23 1511 09/07/23 2026 09/08/23 0432  BP: 93/63 93/63 111/60 137/72  Pulse: 75 75 79 76  Resp:   17 18  Temp: 98.5 F (36.9 C) 98.5 F (36.9 C) 100.3 F (37.9 C) 99.3 F (37.4 C)  TempSrc: Oral Oral Oral Oral  SpO2: 100% 100% 100% 100%  Weight:      Height:        Intake/Output Summary (Last 24 hours) at 09/08/2023 0752 Last data filed at 09/08/2023 9562 Gross per 24 hour  Intake 840 ml  Output 1000 ml  Net -160 ml   Filed Weights   09/01/23 0819  Weight: 71.2 kg    Examination:  General:  Pleasantly resting in bed, No acute distress. HEENT:  Normocephalic atraumatic.  Sclerae nonicteric, noninjected.  Extraocular movements intact bilaterally. Neck:  Without mass or deformity.  Trachea is midline. Lungs:  Clear to auscultate bilaterally without rhonchi, wheeze, or rales. Heart:  Regular rate and rhythm.  Without murmurs, rubs, or gallops. Abdomen:  Soft, nontender, nondistended.  Without guarding or rebound. Extremities: Without cyanosis, clubbing, edema, or obvious deformity. Skin:  Warm and dry, no erythema.  Data Reviewed: I have personally reviewed following labs and imaging studies  CBC: Recent Labs  Lab 09/01/23 1309 09/01/23 1404 09/01/23 1552 09/02/23 0839 09/04/23 0737  WBC  --   --   --  10.7* 11.6*  HGB 8.5* 8.5* 9.2* 8.9* 8.0*  HCT 25.0* 25.0* 27.0*  27.2* 24.6*  MCV  --   --   --  90.4 89.8  PLT  --   --   --  276 243   Basic Metabolic Panel: Recent Labs  Lab 09/04/23 0737 09/05/23 0322 09/06/23 0317 09/07/23 0751 09/08/23 0634  NA 139 137 140 139 136  K 3.4* 3.7 3.8 4.0 4.6  CL 110 104 110 110 107  CO2 20* 19* 20* 19* 21*  GLUCOSE 143* 127* 128* 121* 115*  BUN 71* 76* 82* 81* 83*  CREATININE 4.92* 4.85* 5.19* 5.03* 5.11*  CALCIUM 8.0* 8.1* 8.0* 8.0* 8.1*   GFR: Estimated Creatinine Clearance: 15 mL/min (A) (by C-G formula based on SCr of 5.11 mg/dL (H)).  CBG: Recent Labs  Lab 09/07/23 0719 09/07/23 1154 09/07/23 1611 09/07/23 2130 09/08/23 0724  GLUCAP 109* 168* 107* 170* 119*   No results found for this or any previous visit (from the past 240 hour(s)).   Radiology Studies: No results found.  Scheduled Meds:  amLODipine  10 mg Oral Daily   calcitRIOL  0.25 mcg Oral Once per day on Monday Wednesday Friday   calcium acetate  667 mg Oral BID WC   carvedilol  3.125 mg Oral BID WC   Chlorhexidine Gluconate Cloth  6 each Topical Daily   docusate sodium  100 mg Oral BID   fluticasone furoate-vilanterol  1 puff Inhalation Daily   And   umeclidinium bromide  1 puff Inhalation Daily   gabapentin  400 mg Oral BID   heparin injection (subcutaneous)  5,000 Units Subcutaneous Q8H   insulin aspart  0-15 Units Subcutaneous TID WC   insulin aspart  0-5 Units Subcutaneous QHS   sodium bicarbonate  650 mg Oral TID   sodium chloride flush  3 mL Intravenous Q12H   tamsulosin  0.4 mg Oral Daily   Continuous Infusions:   LOS: 7 days   Time spent:  Azucena Fallen, DO [facility]  If 7PM-7AM, please contact night-coverage [email or web address]  09/08/2023, 7:52 AM

## 2023-09-08 NOTE — Progress Notes (Signed)
PT Cancellation Note  Patient Details Name: Curtis Hartman MRN: [id number] DOB: 1961/01/21   Cancelled Treatment:    Reason Eval/Treat Not Completed: Other (comment) Attempted to see pt for PT tx, pt received lying in bed noting fatigue after PT session yesterday & declining participation. Will f/u as able.  Aleda Grana, PT, DPT 09/08/23, 11:21 AM   Sandi Mariscal 09/08/2023, 11:21 AM

## 2023-09-08 NOTE — Progress Notes (Signed)
Patient ID: MASTON WIGHT, male   DOB: Oct 01, 1961, 62 y.o.   MRN: [id number] S:We were asked to re-consult on Mr. Tresa Garter due to fluctuating/worsening BUN/Cr.  Please see initial consult note dated 09/03/23.  His Scr has ranged 4.84-5.19 since admission, however his eGFR has remained 12-13 mL/min.  He denies any nausea or vomiting but does report multiple episodes of diarrhea over the last 3 days.  He reports them as watery and had 3 episodes on Sunday and one yesterday.  He was given IVF's on 10/29 without significant improvement.  He denies any dysgeusia, anorexia, malaise, or lower extremity edema, SOB, orthopnea, or PND.  O:BP 138/82 (BP Location: Left Arm)   Pulse 86   Temp 99.9 F (37.7 C) (Oral)   Resp 17   Ht 5\' 9"  (1.753 m)   Wt 71.2 kg   SpO2 98%   BMI 23.18 kg/m   Intake/Output Summary (Last 24 hours) at 09/08/2023 1032 Last data filed at 09/08/2023 0750 Gross per 24 hour  Intake 1080 ml  Output 1000 ml  Net 80 ml   Intake/Output: I/O last 3 completed shifts: In: 840 [P.O.:840] Out: 1800 [Urine:1800]  Intake/Output this shift:  Total I/O In: 240 [P.O.:240] Out: -  Weight change:  Gen: NAD CVS: RRR Resp:CTA Abd: +BS, soft, NT/ND Ext: no edema  Recent Labs  Lab 09/02/23 0839 09/03/23 0837 09/04/23 0737 09/05/23 0322 09/06/23 0317 09/07/23 0751 09/08/23 0634  NA 143 140 139 137 140 139 136  K 4.2 3.7 3.4* 3.7 3.8 4.0 4.6  CL 110 110 110 104 110 110 107  CO2 18* 18* 20* 19* 20* 19* 21*  GLUCOSE 162* 186* 143* 127* 128* 121* 115*  BUN 85* 75* 71* 76* 82* 81* 83*  CREATININE 4.84* 4.96* 4.92* 4.85* 5.19* 5.03* 5.11*  CALCIUM 8.3* 8.5* 8.0* 8.1* 8.0* 8.0* 8.1*   Liver Function Tests: No results for input(s): "AST", "ALT", "ALKPHOS", "BILITOT", "PROT", "ALBUMIN" in the last 168 hours. No results for input(s): "LIPASE", "AMYLASE" in the last 168 hours. No results for input(s): "AMMONIA" in the last 168 hours. CBC: Recent Labs  Lab 09/01/23 1552  09/02/23 0839 09/04/23 0737  WBC  --  10.7* 11.6*  HGB 9.2* 8.9* 8.0*  HCT 27.0* 27.2* 24.6*  MCV  --  90.4 89.8  PLT  --  276 243   Cardiac Enzymes: No results for input(s): "CKTOTAL", "CKMB", "CKMBINDEX", "TROPONINI" in the last 168 hours. CBG: Recent Labs  Lab 09/07/23 0719 09/07/23 1154 09/07/23 1611 09/07/23 2130 09/08/23 0724  GLUCAP 109* 168* 107* 170* 119*    Iron Studies: No results for input(s): "IRON", "TIBC", "TRANSFERRIN", "FERRITIN" in the last 72 hours. Studies/Results: No results found.  amLODipine  10 mg Oral Daily   calcitRIOL  0.25 mcg Oral Once per day on Monday Wednesday Friday   calcium acetate  667 mg Oral BID WC   carvedilol  3.125 mg Oral BID WC   Chlorhexidine Gluconate Cloth  6 each Topical Daily   docusate sodium  100 mg Oral BID   fluticasone furoate-vilanterol  1 puff Inhalation Daily   And   umeclidinium bromide  1 puff Inhalation Daily   gabapentin  400 mg Oral BID   heparin injection (subcutaneous)  5,000 Units Subcutaneous Q8H   insulin aspart  0-15 Units Subcutaneous TID WC   insulin aspart  0-5 Units Subcutaneous QHS   sodium bicarbonate  650 mg Oral TID   sodium chloride flush  3 mL Intravenous Q12H  tamsulosin  0.4 mg Oral Daily    BMET    Component Value Date/Time   NA 136 09/08/2023 0634   K 4.6 09/08/2023 0634   CL 107 09/08/2023 0634   CO2 21 (L) 09/08/2023 0634   GLUCOSE 115 (H) 09/08/2023 0634   BUN 83 (H) 09/08/2023 0634   CREATININE 5.11 (H) 09/08/2023 0634   CREATININE 2.78 (H) 11/27/2022 1317   CALCIUM 8.1 (L) 09/08/2023 0634   GFRNONAA 12 (L) 09/08/2023 0634   CBC    Component Value Date/Time   WBC 11.6 (H) 09/04/2023 0737   RBC 2.74 (L) 09/04/2023 0737   HGB 8.0 (L) 09/04/2023 0737   HCT 24.6 (L) 09/04/2023 0737   PLT 243 09/04/2023 0737   MCV 89.8 09/04/2023 0737   MCH 29.2 09/04/2023 0737   MCHC 32.5 09/04/2023 0737   RDW 13.5 09/04/2023 0737   LYMPHSABS 1,723 11/27/2022 1317   EOSABS 218  11/27/2022 1317   BASOSABS 61 11/27/2022 1317    Pt with PMH significant for COPD, HTN, DM type II, IBS, multi-level DDD, and 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 Wolfgang Phoenix) who presented for lumbar interbody fusion of L2-L3, L3-L4, L4-L5 on 09/01/23.  His hospital course was complicated by AKI/CKD stage IV and we were consulted to further evaluate and manage his AKI.  He has had progressive CKD and was told by Dr. Wolfgang Phoenix that he was nearing the need for dialysis at his last visit 2 weeks ago.  He denies any N/V/dysgeusia, anorexia, lower extremity edema.   Assessment and plan: AKI/CKD stage IV - likely multifactorial with contrast nephropathy, ischemic ATN, ABLA with concomitant ARB therapy, acute urinary retention.  Olmesartan is on hold.  He did receive IVF's after surgery but had several episodes of diarrhea over the past 72 hours.  No uremic symptoms or indication for dialysis.  Will restart IVF's and follow UOP and daily SCr.  Hopefully his Scr will reach a plateau and improve off of ARB and increased intake.  He is nearing the need for HD and will consult [facility] for vein mapping and can arrange for outpatient AVF creation or as an inpatient if he will be going to CIR. Acute urinary retention - improved UOP after foley catheter placed.  Currently on flomax.  Continue with foley catheter HTN/Volume - stable for now. ABLA - follow H/H and transfuse for hgb <7.  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  Now with hyperphosphatemia.  Continue with phoslo qac and follow. DM type II - per primary Lumbar stenosis - s/p lumbar spine fusion.  Per Neurosurgery. Metabolic acidosis - continue with home sodium bicarb dose and follow.    Irena Cords, MD [facility]

## 2023-09-08 NOTE — Plan of Care (Signed)
  Problem: Education: Goal: Knowledge of General Education information will improve Description: Including pain rating scale, medication(s)/side effects and non-pharmacologic comfort measures Outcome: Progressing   Problem: Health Behavior/Discharge Planning: Goal: Ability to manage health-related needs will improve Outcome: Progressing   Problem: Clinical Measurements: Goal: Will remain free from infection Outcome: Progressing   

## 2023-09-08 NOTE — Consult Note (Signed)
Physical Medicine and Rehabilitation Consult Reason for Consult: Evaluate appropriateness for Inpatient Rehab Referring Physician: Dr. Hoyt Koch   HPI: Curtis Hartman is a 62 y.o. male with PMHx of  has a past medical history of Arthritis, CKD (chronic kidney disease), COPD (chronic obstructive pulmonary disease) (HCC), Diabetes mellitus without complication (HCC), GERD (gastroesophageal reflux disease), Headache, Hepatitis, Hypertension, and Neuropathy. . They were admitted to [facility] on 09/01/2023 for planned intervention for severe lumbar stenosis, with relevant history of prior C3-7 PCDF.  On 10-29 he underwent L2-5 DLIF and instrumentation with Dr. Maisie Fus.  Postop course was complicated by hypertensive emergency, AKI on CKD stage IV, urinary retention with need for Foley catheter, uncontrolled diabetes, and poor pain control. PM&R was consulted to evaluate appropriateness for IPR admission.   Patient lives in a single-story home with approximately 2 steps to enter, without a railing.  He lives with his younger sister and her boyfriend, who are willing to provide 24/7 care and can provide some physical assistance.  He uses a rollator for ambulation at baseline.   Review of Systems  Constitutional:  Negative for chills and fever.  Respiratory:  Negative for cough and shortness of breath.   Cardiovascular:  Negative for chest pain and palpitations.  Gastrointestinal:  Negative for abdominal pain, constipation, heartburn, nausea and vomiting.  Genitourinary:  Negative for dysuria.       Retention  Musculoskeletal:  Positive for back pain. Negative for neck pain.       Right biceps pain, left shoulder pain  Neurological:  Positive for sensory change and weakness. Negative for dizziness and headaches.  Psychiatric/Behavioral:  Negative for depression. The patient does not have insomnia.    Past Medical History:  Diagnosis Date   Arthritis    CKD (chronic kidney  disease)    COPD (chronic obstructive pulmonary disease) (HCC)    Diabetes mellitus without complication (HCC)    type 2   GERD (gastroesophageal reflux disease)    Headache    Hx Migraines   Hepatitis    Hypertension    Neuropathy    Past Surgical History:  Procedure Laterality Date   ANTERIOR CERVICAL DECOMP/DISCECTOMY FUSION N/A 11/05/2022   Procedure: Anterior Cervical Decompression/discectomy Fusion Cervical Four-Cervical Five;  Surgeon: Bedelia Person, MD;  Location: [facility] OR;  Service: Neurosurgery;  Laterality: N/A;  3C   ANTERIOR LAT LUMBAR FUSION N/A 09/01/2023   Procedure: DIRECT LUMBAR INTERBODY FUSION, LUMBAR TWO-LUMBAR THREE, LUMBAR THREE-LUMBAR FOUR, LUMBAR FOUR-LUMBAR FIVE, RIGHT PRONE TRANSPSOAS, LUMBAR TWO LUMBAR FIVE POSTERIOR PERCUTANEOUS FUSION LUMBAR FOUR-LUMBAR FIVE MINIMALLY INVASIVE LAMINECTOMY, FORAMINOTOMY;  Surgeon: Bedelia Person, MD;  Location: [facility] OR;  Service: Neurosurgery;  Laterality: N/A;   BACK SURGERY  2023   Cervical Spine   BIOPSY  03/03/2022   Procedure: BIOPSY;  Surgeon: Lanelle Bal, DO;  Location: [facility] ENDO SUITE;  Service: Endoscopy;;   COLONOSCOPY WITH PROPOFOL N/A 03/03/2022   Procedure: COLONOSCOPY WITH PROPOFOL;  Surgeon: Lanelle Bal, DO;  Location: [facility] SUITE;  Service: Endoscopy;  Laterality: N/A;  8:00am   ESOPHAGOGASTRODUODENOSCOPY (EGD) WITH PROPOFOL N/A 03/03/2022   Procedure: ESOPHAGOGASTRODUODENOSCOPY (EGD) WITH PROPOFOL;  Surgeon: Lanelle Bal, DO;  Location: [facility] SUITE;  Service: Endoscopy;  Laterality: N/A;   foot right partial toe amputation Right    FOOT SURGERY Right    x3   hernia surgery x4     LUMBAR PERCUTANEOUS PEDICLE SCREW 3 LEVEL N/A 09/01/2023  Procedure: L2-5 LUMBAR PERCUTANEOUS PEDICLE SCREW 3 LEVEL;  Surgeon: Bedelia Person, MD;  Location: [facility] OR;  Service: Neurosurgery;  Laterality: N/A;   POLYPECTOMY  03/03/2022   Procedure: POLYPECTOMY;  Surgeon: Lanelle Bal, DO;  Location:  [facility];  Service: Endoscopy;;   Family History  Problem Relation Age of Onset   Heart disease Mother    Seizures Father    Stroke Maternal Grandfather    Liver disease Paternal Grandmother    Social History:  reports that he has been smoking cigarettes. He has never been exposed to tobacco smoke. He has never used smokeless tobacco. He reports that he does not currently use alcohol. He reports that he does not currently use drugs. Allergies: No Active Allergies Medications Prior to Admission  Medication Sig Dispense Refill   amLODipine (NORVASC) 10 MG tablet Take 10 mg by mouth daily as needed (diastolic blood pressure above 90).     calcitRIOL (ROCALTROL) 0.25 MCG capsule Take 0.25 mcg by mouth 3 (three) times a week.     calcium acetate (PHOSLO) 667 MG tablet Take 667 mg by mouth 2 (two) times daily.     fluticasone (FLONASE) 50 MCG/ACT nasal spray Place 2 sprays into both nostrils daily as needed for allergies (congestion).     gabapentin (NEURONTIN) 400 MG capsule Take 400 mg by mouth 2 (two) times daily.     insulin aspart (NOVOLOG) 100 UNIT/ML injection Inject 2-10 Units into the skin 3 (three) times daily as needed (blood sugar over 150).     LANTUS 100 UNIT/ML injection Inject 5 Units into the skin at bedtime as needed (blood sugar over 150).     morphine (MSIR) 15 MG tablet Take 15 mg by mouth every 6 (six) hours as needed for severe pain.     olmesartan (BENICAR) 5 MG tablet Take 5 mg by mouth daily as needed (diastolic blood pressure above 90).     Wheat Dextrin (BENEFIBER) POWD Mix 2 - 3 teaspoons in 8 ounces of water daily in the mornings. 475 g 0   albuterol (PROAIR HFA) 108 (90 Base) MCG/ACT inhaler 2 puffs every 4 hours as needed only  if your can't catch your breath 18 g 11   azithromycin (ZITHROMAX) 250 MG tablet Take 2 on day one then 1 daily x 4 days (Patient not taking: Reported on 08/19/2023) 6 tablet 0   Budeson-Glycopyrrol-Formoterol (BREZTRI AEROSPHERE)  160-9-4.8 MCG/ACT AERO Inhale 2 puffs into the lungs in the morning and at bedtime. (Patient taking differently: Inhale 2 puffs into the lungs 2 (two) times daily as needed (shortness of breath).)     LOKELMA 10 g PACK packet Take 10 g by mouth daily as needed (high potassium).      Home: Home Living Family/patient expects to be discharged to:: Private residence Living Arrangements: Other relatives (sister and her boyfriend) Available Help at Discharge: Family, Available 24 hours/day Type of Home: House Home Access: Stairs to enter Secretary/administrator of Steps: 2 Home Layout: One level Bathroom Shower/Tub: Engineer, manufacturing systems: Standard Home Equipment: Rollator (4 wheels), BSC/3in1, Adaptive equipment, Shower seat, Tub bench Adaptive Equipment: Reacher  Functional History: Prior Function Prior Level of Function : Independent/Modified Independent Mobility Comments: uses rollator outside, and generally no AD in the home as he furniture walks ADLs Comments: pt managing ADLs and light IADls Functional Status:  Mobility: Bed Mobility Overal bed mobility: Needs Assistance Bed Mobility: Sidelying to Sit, Rolling Rolling: Supervision Sidelying to sit: Supervision Sit  to sidelying: Mod assist General bed mobility comments: pt OOB in recliner at start and end of session Transfers Overall transfer level: Needs assistance Equipment used: Rolling walker (2 wheels) Transfers: Sit to/from Stand Sit to Stand: Min assist Bed to/from chair/wheelchair/BSC transfer type:: Step pivot Step pivot transfers: Min assist General transfer comment: cueing for posture and technique, min assist from recliner. Ambulation/Gait Ambulation/Gait assistance: Min assist Gait Distance (Feet): 55 Feet (+ 25 ft) Assistive device: Rolling walker (2 wheels) Gait Pattern/deviations: Step-through pattern, Decreased stride length, Trunk flexed, Decreased dorsiflexion - right, Decreased dorsiflexion -  left, Knee flexed in stance - right, Knee flexed in stance - left General Gait Details: pt with slow gait and increased ankle strategies with each step. dependent on UE support and poor acceptance of cues for posture and positioning in RW. pt reluctant to accept cues through session. poor activity tolerance and poor insight to limited activity tolerance. assist for balance, especially when turning Gait velocity: decreased Gait velocity interpretation: <1.31 ft/sec, indicative of household ambulator    ADL: ADL Overall ADL's : Needs assistance/impaired Grooming: Set up, Sitting Upper Body Dressing : Minimal assistance, Sitting Upper Body Dressing Details (indicate cue type and reason): brace mgmt Lower Body Dressing: Sit to/from stand, Moderate assistance Lower Body Dressing Details (indicate cue type and reason): educated on sock aide and reacher for LB dressing, min assist to don/doff socks and increased time. would benefit from further training Toilet Transfer: Minimal assistance, Contact guard assist, Ambulation, Rolling walker (2 wheels) Toilet Transfer Details (indicate cue type and reason): from EOB min A, min guard from recliner with B arm rests Functional mobility during ADLs: Contact guard assist, Rolling walker (2 wheels), Cueing for safety  Cognition: Cognition Overall Cognitive Status: Impaired/Different from baseline Orientation Level: Oriented X4 Cognition Arousal: Alert Behavior During Therapy: WFL for tasks assessed/performed Overall Cognitive Status: Impaired/Different from baseline Area of Impairment: Following commands, Safety/judgement, Awareness, Problem solving Following Commands: Follows one step commands inconsistently Safety/Judgement: Decreased awareness of safety, Decreased awareness of deficits Problem Solving: Difficulty sequencing, Requires verbal cues General Comments: pt needing increased cues for technique and safety. poor problem solving and  understanding of PT education.  Blood pressure 138/82, pulse 86, temperature 99.9 F (37.7 C), temperature source Oral, resp. rate 17, height 5\' 9"  (1.753 m), weight 71.2 kg, SpO2 98%. Physical Exam  Constitutional: No apparent distress. Appropriate appearance for age.  HENT: No JVD. Neck Supple. Trachea midline. Atraumatic, normocephalic. Eyes: PERRLA. EOMI. Visual fields grossly intact. +glasses Cardiovascular: RRR, no murmurs/rub/gallops. No Edema. Peripheral pulses 2+  Respiratory: CTAB. No rales, rhonchi, or wheezing. On RA.  Abdomen: + bowel sounds, normoactive. No distention or tenderness.  GU: Not examined. +Foley, draining clear urine.  Skin: C/D/I. No apparent lesions. IV intact. Surgical site covered.  + Sutures on right distal foot, covered in overlying scab, no apparent drainage  MSK: + Popeye deformity of right biceps, with TTP muscle belly + Bilateral thenar eminence wasting + Active range of motion left shoulder limited to< 90; + drop arm; PROM limited to 110 degrees + Right 1-2 toe amputations       Neurologic exam:  Cognition: AAO to person, place, time and event.  Language: Fluent, No substitutions or neoglisms. No dysarthria. Names 3/3 objects correctly.  Memory: Recalls 3/3 objects at 5 minutes. No apparent deficits  Insight: Good insight into current condition.  Mood: Pleasant affect, appropriate mood.  Sensation: To light touch intact in BL UEs and LEs  Reflexes: 1+ in  BL UE and LEs. Negative Hoffman's and babinski signs bilaterally.  CN: 2-12 grossly intact.  Coordination: Mild bilateral upper extremity intention tremors. No ataxia on FTN  Spasticity: MAS 0 in all extremities.  Strength: Bilateral upper extremities 4 out of 5 proximally, 3 out of 5 distally Left lower extremity 3+/5 hip flexion, 5 -/5 distal muscles Right lower extremity 3 -/5 hip flexion, 4+/5 distal muscles     Results for orders placed or performed during the hospital encounter of  09/01/23 (from the past 24 hour(s))  Glucose, capillary     Status: Abnormal   Collection Time: 09/07/23  4:11 PM  Result Value Ref Range   Glucose-Capillary 107 (H) 70 - 99 mg/dL  Glucose, capillary     Status: Abnormal   Collection Time: 09/07/23  9:30 PM  Result Value Ref Range   Glucose-Capillary 170 (H) 70 - 99 mg/dL  Basic metabolic panel     Status: Abnormal   Collection Time: 09/08/23  6:34 AM  Result Value Ref Range   Sodium 136 135 - 145 mmol/L   Potassium 4.6 3.5 - 5.1 mmol/L   Chloride 107 98 - 111 mmol/L   CO2 21 (L) 22 - 32 mmol/L   Glucose, Bld 115 (H) 70 - 99 mg/dL   BUN 83 (H) 8 - 23 mg/dL   Creatinine, Ser 3.55 (H) 0.61 - 1.24 mg/dL   Calcium 8.1 (L) 8.9 - 10.3 mg/dL   GFR, Estimated 12 (L) >60 mL/min   Anion gap 8 5 - 15  Glucose, capillary     Status: Abnormal   Collection Time: 09/08/23  7:24 AM  Result Value Ref Range   Glucose-Capillary 119 (H) 70 - 99 mg/dL  Glucose, capillary     Status: Abnormal   Collection Time: 09/08/23 11:32 AM  Result Value Ref Range   Glucose-Capillary 134 (H) 70 - 99 mg/dL   No results found.  Assessment/Plan: Diagnosis: Lumbar stenosis status post  L2-5 DLIF Does the need for close, 24 hr/day medical supervision in concert with the patient's rehab needs make it unreasonable for this patient to be served in a less intensive setting? Yes Co-Morbidities requiring supervision/potential complications: hypertensive emergency, AKI on CKD stage IV, urinary retention with need for Foley catheter, uncontrolled diabetes, right toe wound, and poor pain control. Due to bladder management, safety, skin/wound care, disease management, medication administration, pain management, and patient education, does the patient require 24 hr/day rehab nursing? Yes Does the patient require coordinated care of a physician, rehab nurse, therapy disciplines of PT and OT  to address physical and functional deficits in the context of the above medical  diagnosis(es)? Yes Addressing deficits in the following areas: balance, endurance, locomotion, strength, transferring, bowel/bladder control, bathing, dressing, feeding, grooming, and toileting Can the patient actively participate in an intensive therapy program of at least 3 hrs of therapy per day at least 5 days per week? Yes The potential for patient to make measurable gains while on inpatient rehab is excellent Anticipated functional outcomes upon discharge from inpatient rehab are supervision  with PT, supervision with OT. Estimated rehab length of stay to reach the above functional goals is: 10-14 days Anticipated discharge destination: Home Overall Rehab/Functional Prognosis: excellent  POST ACUTE RECOMMENDATIONS: This patient's condition is appropriate for continued rehabilitative care in the following setting: CIR Patient has agreed to participate in recommended program. Yes Note that insurance prior authorization may be required for reimbursement for recommended care.  Comment: This patient is an excellent  candidate for inpatient rehab; he has good family supports with a safe dispo plan and 24/7 supervision at discharge.  He remains with significant weakness in his bilateral lower extremities, along with medical complications requiring physician oversight including AKI on CKD, hypertension, urinary retention, and poor pain control.  He is motivated to participate in inpatient rehab and has excellent potential for functional gains to assist in discharge home.   MEDICAL RECOMMENDATIONS: Recommend MSK ultrasound or MRI of right upper extremity to evaluate for possible biceps tear, as this is a significant source of pain and functional limitations for the patient Recommend discontinuation of Foley trial when medically appropriate Recommend removal of sutures on right foot, which per the patient were placed 10/27 for laceration injury.   I have personally performed a face to face  diagnostic evaluation of this patient. Additionally, I have examined the patient's medical record including any pertinent labs and radiographic images. If the physician assistant has documented in this note, I have reviewed and edited or otherwise concur with the physician assistant's documentation.  Thanks,  [name], DO 09/08/2023

## 2023-09-08 NOTE — Progress Notes (Signed)
Inpatient Rehab Coordinator Note:  I met with patient at bedside to discuss CIR recommendations and goals/expectations of CIR stay.  We reviewed 3 hrs/day of therapy, physician follow up, and average length of stay 2 weeks (dependent upon progress) with goals of mod I.  Pt agreeable to CIR, and confirms his sister is home and can provide supervision if needed. We discussed need for insurance approval and I will start that pending review by PMR MD.    Estill Dooms, PT, DPT Admissions Coordinator [phone] 09/08/23  12:12 PM

## 2023-09-08 NOTE — Consult Note (Addendum)
Hospital Consult    Reason for Consult:  dialysis access Requesting Physician:  Coladonato MD MRN #:  [id number]  History of Present Illness: Curtis Hartman is a 62 y.o. male with a past medical history of HTN, neuropathy, lumbar radiculopathy, DMII, hepatitis, CKD and COPD who was admitted to the hospital on 09/01/2023 with severe lumbar stenosis with instability.  He underwent L2-L5 laminectomy, foraminotomy, and fusion later that day.  He has a history of CKD 4.  His creatinine has been fluctuating/worsening since admission.  Nephrology has anticipated the patient is nearing dialysis.  We were consulted for dialysis access placement.  The patient is resting comfortably upon my examination.  He endorses continued lower back pain.  He denies any fevers, chills, shortness of breath, or chest pain.  He is right-handed.  Past Medical History:  Diagnosis Date   Arthritis    CKD (chronic kidney disease)    COPD (chronic obstructive pulmonary disease) (HCC)    Diabetes mellitus without complication (HCC)    type 2   GERD (gastroesophageal reflux disease)    Headache    Hx Migraines   Hepatitis    Hypertension    Neuropathy     Past Surgical History:  Procedure Laterality Date   ANTERIOR CERVICAL DECOMP/DISCECTOMY FUSION N/A 11/05/2022   Procedure: Anterior Cervical Decompression/discectomy Fusion Cervical Four-Cervical Five;  Surgeon: Bedelia Person, MD;  Location: [facility] OR;  Service: Neurosurgery;  Laterality: N/A;  3C   ANTERIOR LAT LUMBAR FUSION N/A 09/01/2023   Procedure: DIRECT LUMBAR INTERBODY FUSION, LUMBAR TWO-LUMBAR THREE, LUMBAR THREE-LUMBAR FOUR, LUMBAR FOUR-LUMBAR FIVE, RIGHT PRONE TRANSPSOAS, LUMBAR TWO LUMBAR FIVE POSTERIOR PERCUTANEOUS FUSION LUMBAR FOUR-LUMBAR FIVE MINIMALLY INVASIVE LAMINECTOMY, FORAMINOTOMY;  Surgeon: Bedelia Person, MD;  Location: [facility] OR;  Service: Neurosurgery;  Laterality: N/A;   BACK SURGERY  2023   Cervical Spine   BIOPSY  03/03/2022    Procedure: BIOPSY;  Surgeon: Lanelle Bal, DO;  Location: [facility];  Service: Endoscopy;;   COLONOSCOPY WITH PROPOFOL N/A 03/03/2022   Procedure: COLONOSCOPY WITH PROPOFOL;  Surgeon: Lanelle Bal, DO;  Location: [facility];  Service: Endoscopy;  Laterality: N/A;  8:00am   ESOPHAGOGASTRODUODENOSCOPY (EGD) WITH PROPOFOL N/A 03/03/2022   Procedure: ESOPHAGOGASTRODUODENOSCOPY (EGD) WITH PROPOFOL;  Surgeon: Lanelle Bal, DO;  Location: [facility];  Service: Endoscopy;  Laterality: N/A;   foot right partial toe amputation Right    FOOT SURGERY Right    x3   hernia surgery x4     LUMBAR PERCUTANEOUS PEDICLE SCREW 3 LEVEL N/A 09/01/2023   Procedure: L2-5 LUMBAR PERCUTANEOUS PEDICLE SCREW 3 LEVEL;  Surgeon: Bedelia Person, MD;  Location: [facility] OR;  Service: Neurosurgery;  Laterality: N/A;   POLYPECTOMY  03/03/2022   Procedure: POLYPECTOMY;  Surgeon: Lanelle Bal, DO;  Location: [facility];  Service: Endoscopy;;    No Active Allergies  Prior to Admission medications   Medication Sig Start Date End Date Taking? Authorizing Provider  amLODipine (NORVASC) 10 MG tablet Take 10 mg by mouth daily as needed (diastolic blood pressure above 90).   Yes [provider]  calcitRIOL (ROCALTROL) 0.25 MCG capsule Take 0.25 mcg by mouth 3 (three) times a week. 07/25/23  Yes [provider]  calcium acetate (PHOSLO) 667 MG tablet Take 667 mg by mouth 2 (two) times daily. 07/25/23  Yes [provider]  fluticasone (FLONASE) 50 MCG/ACT nasal spray Place 2 sprays into both nostrils daily as needed for allergies (congestion). 09/12/22 09/12/23  Yes [provider]  gabapentin (NEURONTIN) 400 MG capsule Take 400 mg by mouth 2 (two) times daily.   Yes [provider]  insulin aspart (NOVOLOG) 100 UNIT/ML injection Inject 2-10 Units into the skin 3 (three) times daily as needed (blood sugar over 150).   Yes [provider]  LANTUS 100  UNIT/ML injection Inject 5 Units into the skin at bedtime as needed (blood sugar over 150). 03/05/21  Yes [provider]  morphine (MSIR) 15 MG tablet Take 15 mg by mouth every 6 (six) hours as needed for severe pain.   Yes [provider]  olmesartan (BENICAR) 5 MG tablet Take 5 mg by mouth daily as needed (diastolic blood pressure above 90).   Yes [provider]  Wheat Dextrin (BENEFIBER) POWD Mix 2 - 3 teaspoons in 8 ounces of water daily in the mornings. 04/21/23  Yes Aida Raider, NP  albuterol (PROAIR HFA) 108 (90 Base) MCG/ACT inhaler 2 puffs every 4 hours as needed only  if your can't catch your breath 06/10/23   Nyoka Cowden, MD  azithromycin (ZITHROMAX) 250 MG tablet Take 2 on day one then 1 daily x 4 days Patient not taking: Reported on 08/19/2023 06/10/23   Nyoka Cowden, MD  Budeson-Glycopyrrol-Formoterol (BREZTRI AEROSPHERE) 160-9-4.8 MCG/ACT AERO Inhale 2 puffs into the lungs in the morning and at bedtime. Patient taking differently: Inhale 2 puffs into the lungs 2 (two) times daily as needed (shortness of breath). 06/10/23   Nyoka Cowden, MD  LOKELMA 10 g PACK packet Take 10 g by mouth daily as needed (high potassium). 03/18/23   [provider]    Social History   Socioeconomic History   Marital status: Single    Spouse name: Not on file   Number of children: Not on file   Years of education: Not on file   Highest education level: Not on file  Occupational History   Not on file  Tobacco Use   Smoking status: Every Day    Current packs/day: 0.25    Types: Cigarettes    Passive exposure: Never   Smokeless tobacco: Never   Tobacco comments:    Pt smokes a couple cigarettes a day (as of 08/26/23)  Vaping Use   Vaping status: Never Used  Substance and Sexual Activity   Alcohol use: Not Currently   Drug use: Not Currently   Sexual activity: Not Currently  Other Topics Concern   Not on file  Social History Narrative   Not on  file   Social Determinants of Health   Financial Resource Strain: Not on file  Food Insecurity: No Food Insecurity (09/02/2023)   Hunger Vital Sign    Worried About Running Out of Food in the Last Year: Never true    Ran Out of Food in the Last Year: Never true  Transportation Needs: No Transportation Needs (09/02/2023)   PRAPARE - Administrator, Civil Service (Medical): No    Lack of Transportation (Non-Medical): No  Physical Activity: Not on file  Stress: Not on file  Social Connections: Not on file  Intimate Partner Violence: Not At Risk (09/02/2023)   Humiliation, Afraid, Rape, and Kick questionnaire    Fear of Current or Ex-Partner: No    Emotionally Abused: No    Physically Abused: No    Sexually Abused: No     Family History  Problem Relation Age of Onset   Heart disease Mother    Seizures  Father    Stroke Maternal Grandfather    Liver disease Paternal Grandmother     ROS: Otherwise negative unless mentioned in HPI  Physical Examination  Vitals:   09/08/23 0432 09/08/23 0810  BP: 137/72 138/82  Pulse: 76 86  Resp: 18 17  Temp: 99.3 F (37.4 C) 99.9 F (37.7 C)  SpO2: 100% 98%   Body mass index is 23.18 kg/m.  General:  WDWN in NAD Gait: Not observed HENT: WNL, normocephalic Pulmonary: normal non-labored breathing Cardiac: regular Abdomen:  soft, NT/ND, no masses Skin: without rashes Vascular Exam/Pulses: 2+ radial and brachial pulses bilaterally Extremities: without ischemic changes, without Gangrene , without cellulitis; without open wounds;  Musculoskeletal: no muscle wasting or atrophy  Neurologic: A&O X 3;  No focal weakness or paresthesias are detected; speech is fluent/normal Psychiatric:  The pt has Normal affect. Lymph:  Unremarkable  CBC    Component Value Date/Time   WBC 11.6 (H) 09/04/2023 0737   RBC 2.74 (L) 09/04/2023 0737   HGB 8.0 (L) 09/04/2023 0737   HCT 24.6 (L) 09/04/2023 0737   PLT 243 09/04/2023 0737   MCV  89.8 09/04/2023 0737   MCH 29.2 09/04/2023 0737   MCHC 32.5 09/04/2023 0737   RDW 13.5 09/04/2023 0737   LYMPHSABS 1,723 11/27/2022 1317   EOSABS 218 11/27/2022 1317   BASOSABS 61 11/27/2022 1317    BMET    Component Value Date/Time   NA 136 09/08/2023 0634   K 4.6 09/08/2023 0634   CL 107 09/08/2023 0634   CO2 21 (L) 09/08/2023 0634   GLUCOSE 115 (H) 09/08/2023 0634   BUN 83 (H) 09/08/2023 0634   CREATININE 5.11 (H) 09/08/2023 0634   CREATININE 2.78 (H) 11/27/2022 1317   CALCIUM 8.1 (L) 09/08/2023 0634   GFRNONAA 12 (L) 09/08/2023 0634    COAGS: Lab Results  Component Value Date   INR 0.9 09/09/2021     Non-Invasive Vascular Imaging:   Vein Mapping Pending   ASSESSMENT/PLAN: This is a 62 y.o. male with a history of CKD4   -The patient was admitted to the hospital several days ago with worsening lower back pain requiring lumbar spine surgery.  Since his admission he has had worsening/fluctuating creatinine levels -The patient has a history of CKD 4.  Nephrology has evaluated the patient and believes the patient is nearing the need for dialysis.  We were consulted for dialysis access -On my exam the patient is resting comfortably.  He is aware of his future need of dialysis and is agreeable to any surgeries required for access.  He is right-handed. -The patient has 2+ radial pulses bilaterally.  His vein mapping is pending -I have explained to the patient we can pursue likely left upper extremity AV fistula versus AV graft for dialysis access.  I have explained the risks and benefits of surgery.  Fistula versus graft is depending on his vein mapping studies -Potentially the patient surgery will be outpatient if he is getting discharged soon.  If he is going to CIR, we may be able to do surgery while he is inpatient   Loel Dubonnet PA-C [facility] [phone]   I have independently interviewed and examined patient and agree with PA assessment and  plan above.  We had discussed surgery tomorrow but will unfortunately have to wait until Thursday given OR availability.  Leesha Veno C. Randie Heinz, MD [facility] [place] Office: [phone] Pager: [phone]

## 2023-09-08 NOTE — Progress Notes (Signed)
NAD, states he has made good progress, now walking well Full strength in lowers Incisions c/d Foley in place Creatinine stable, K+ nl but increased slowly over the week  L2-5 DLIF - plan for rehab admission

## 2023-09-08 NOTE — Plan of Care (Signed)
  Problem: Pain Management: Goal: General experience of comfort will improve Outcome: Progressing   Problem: Safety: Goal: Ability to remain free from injury will improve Outcome: Progressing

## 2023-09-08 NOTE — Progress Notes (Signed)
    Providing Compassionate, Quality Care - Together   NEUROSURGERY PROGRESS NOTE     S: NAEs o/n.    O: EXAM:  BP 138/82 (BP Location: Left Arm)   Pulse 86   Temp 99.9 F (37.7 C) (Oral)   Resp 17   Ht 5\' 9"  (1.753 m)   Wt 71.2 kg   SpO2 98%   BMI 23.18 kg/m     Awake, alert, oriented  Speech fluent, appropriate  BUE/BLE 5/5 Dressings c/d/I [id number]   ASSESSMENT:  62 y.o. s/p L2-5 DLIF, percutaneous instrumentation     PLAN: -Therapies as tolerated. CIR placement pending.  -Vascular team on board for potential dialysis access per Nephro.  -Supportive care.  -Call w/ questions/concerns.   Patrici Ranks, [facility]

## 2023-09-09 DIAGNOSIS — N179 Acute kidney failure, unspecified: Secondary | ICD-10-CM | POA: Diagnosis not present

## 2023-09-09 DIAGNOSIS — N184 Chronic kidney disease, stage 4 (severe): Secondary | ICD-10-CM

## 2023-09-09 DIAGNOSIS — M48062 Spinal stenosis, lumbar region with neurogenic claudication: Secondary | ICD-10-CM | POA: Diagnosis not present

## 2023-09-09 LAB — BASIC METABOLIC PANEL
Anion gap: 8 (ref 5–15)
BUN: 92 mg/dL — ABNORMAL HIGH (ref 8–23)
CO2: 22 mmol/L (ref 22–32)
Calcium: 8 mg/dL — ABNORMAL LOW (ref 8.9–10.3)
Chloride: 107 mmol/L (ref 98–111)
Creatinine, Ser: 5.27 mg/dL — ABNORMAL HIGH (ref 0.61–1.24)
GFR, Estimated: 12 mL/min — ABNORMAL LOW (ref >60)
Glucose, Bld: 131 mg/dL — ABNORMAL HIGH (ref 70–99)
Potassium: 4.9 mmol/L (ref 3.5–5.1)
Sodium: 137 mmol/L (ref 135–145)

## 2023-09-09 LAB — CBC
HCT: 23.6 % — ABNORMAL LOW (ref 39.0–52.0)
Hemoglobin: 7.6 g/dL — ABNORMAL LOW (ref 13.0–17.0)
MCH: 29.2 pg (ref 26.0–34.0)
MCHC: 32.2 g/dL (ref 30.0–36.0)
MCV: 90.8 fL (ref 80.0–100.0)
Platelets: 327 10*3/uL (ref 150–400)
RBC: 2.6 MIL/uL — ABNORMAL LOW (ref 4.22–5.81)
RDW: 13.1 % (ref 11.5–15.5)
WBC: 10.9 10*3/uL — ABNORMAL HIGH (ref 4.0–10.5)
nRBC: 0 % (ref 0.0–0.2)

## 2023-09-09 LAB — GLUCOSE, CAPILLARY
Glucose-Capillary: 123 mg/dL — ABNORMAL HIGH (ref 70–99)
Glucose-Capillary: 115 mg/dL — ABNORMAL HIGH (ref 70–99)
Glucose-Capillary: 180 mg/dL — ABNORMAL HIGH (ref 70–99)
Glucose-Capillary: 97 mg/dL (ref 70–99)

## 2023-09-09 MED ORDER — LOPERAMIDE HCL 2 MG PO CAPS
2.0000 mg | ORAL_CAPSULE | ORAL | Status: DC | PRN
  Administered 2023-09-09 – 2023-09-10 (×2): 2 mg via ORAL
  Filled 2023-09-09 (×3): qty 1

## 2023-09-09 MED ORDER — SODIUM CHLORIDE 0.45 % IV SOLN
INTRAVENOUS | Status: AC
  Filled 2023-09-09 (×4): qty 75

## 2023-09-09 MED ORDER — DARBEPOETIN ALFA 60 MCG/0.3ML IJ SOSY
60.0000 ug | PREFILLED_SYRINGE | INTRAMUSCULAR | Status: DC
Start: 2023-09-09 — End: 2023-09-11
  Administered 2023-09-09: 60 ug via SUBCUTANEOUS
  Filled 2023-09-09: qty 0.3

## 2023-09-09 NOTE — Progress Notes (Addendum)
   Progress Note    09/09/2023 8:14 AM 8 Days Post-Op  Subjective:  no overnight issues  Vitals:   09/08/23 1517 09/08/23 2055  BP: 115/65 121/73  Pulse: 82 85  Resp: 17 18  Temp: 98.9 F (37.2 C) 99.4 F (37.4 C)  SpO2: 100% 100%    Physical Exam: Aaox3 Palpable radial pulses  CBC    Component Value Date/Time   WBC 11.6 (H) 09/04/2023 0737   RBC 2.74 (L) 09/04/2023 0737   HGB 8.0 (L) 09/04/2023 0737   HCT 24.6 (L) 09/04/2023 0737   PLT 243 09/04/2023 0737   MCV 89.8 09/04/2023 0737   MCH 29.2 09/04/2023 0737   MCHC 32.5 09/04/2023 0737   RDW 13.5 09/04/2023 0737   LYMPHSABS 1,723 11/27/2022 1317   EOSABS 218 11/27/2022 1317   BASOSABS 61 11/27/2022 1317    BMET    Component Value Date/Time   NA 137 09/09/2023 0554   K 4.9 09/09/2023 0554   CL 107 09/09/2023 0554   CO2 22 09/09/2023 0554   GLUCOSE 131 (H) 09/09/2023 0554   BUN 92 (H) 09/09/2023 0554   CREATININE 5.27 (H) 09/09/2023 0554   CREATININE 2.78 (H) 11/27/2022 1317   CALCIUM 8.0 (L) 09/09/2023 0554   GFRNONAA 12 (L) 09/09/2023 0554    INR    Component Value Date/Time   INR 0.9 09/09/2021 0630     Intake/Output Summary (Last 24 hours) at 09/09/2023 0814 Last data filed at 09/09/2023 0630 Gross per 24 hour  Intake --  Output 2200 ml  Net -2200 ml    A/P CKD IV with worsening renal function Plan is for left Upper arm AV fistula tomorrow in OR with Dr. Hetty Blend Will look again at veins in OR, duplex showing marginally size BV/ CV  Answered patients questions Keep NPO after midnight Consent order placed  Dory Horn [facility] [phone] 09/09/2023 7:34 AM  I have independently interviewed and examined the patient.  Marginal veins in the left upper extremity for dialysis access will reevaluate in the OR tomorrow.  Please keep him n.p.o. past midnight.   Tramar Brueckner C. Randie Heinz, MD [facility] [place] Office: [phone] Pager:  [phone]  09/09/2023 8:14 AM

## 2023-09-09 NOTE — Progress Notes (Signed)
Patient ID: Curtis Hartman, male   DOB: 08-06-1961, 62 y.o.   MRN: [id number] S: Still reporting diarrhea.  Also having issues with transfer and ambulation.  C/o back pain this morning.  O:BP 114/62 (BP Location: Left Arm)   Pulse 79   Temp 98.9 F (37.2 C) (Oral)   Resp 18   Ht 5\' 9"  (1.753 m)   Wt 71.2 kg   SpO2 98%   BMI 23.18 kg/m   Intake/Output Summary (Last 24 hours) at 09/09/2023 1012 Last data filed at 09/09/2023 0630 Gross per 24 hour  Intake --  Output 2200 ml  Net -2200 ml   Intake/Output: I/O last 3 completed shifts: In: 1080 [P.O.:1080] Out: 2200 [Urine:2200]  Intake/Output this shift:  No intake/output data recorded. Weight change:  Gen: NAD CVS: RRR Resp:CTA Abd: +BS, soft, NT/ND Ext: no edema  Recent Labs  Lab 09/03/23 0837 09/04/23 0737 09/05/23 0322 09/06/23 0317 09/07/23 0751 09/08/23 0634 09/09/23 0554  NA 140 139 137 140 139 136 137  K 3.7 3.4* 3.7 3.8 4.0 4.6 4.9  CL 110 110 104 110 110 107 107  CO2 18* 20* 19* 20* 19* 21* 22  GLUCOSE 186* 143* 127* 128* 121* 115* 131*  BUN 75* 71* 76* 82* 81* 83* 92*  CREATININE 4.96* 4.92* 4.85* 5.19* 5.03* 5.11* 5.27*  CALCIUM 8.5* 8.0* 8.1* 8.0* 8.0* 8.1* 8.0*   Liver Function Tests: No results for input(s): "AST", "ALT", "ALKPHOS", "BILITOT", "PROT", "ALBUMIN" in the last 168 hours. No results for input(s): "LIPASE", "AMYLASE" in the last 168 hours. No results for input(s): "AMMONIA" in the last 168 hours. CBC: Recent Labs  Lab 09/04/23 0737  WBC 11.6*  HGB 8.0*  HCT 24.6*  MCV 89.8  PLT 243   Cardiac Enzymes: No results for input(s): "CKTOTAL", "CKMB", "CKMBINDEX", "TROPONINI" in the last 168 hours. CBG: Recent Labs  Lab 09/08/23 0724 09/08/23 1132 09/08/23 1613 09/08/23 2149 09/09/23 0726  GLUCAP 119* 134* 157* 163* 123*    Iron Studies: No results for input(s): "IRON", "TIBC", "TRANSFERRIN", "FERRITIN" in the last 72 hours. Studies/Results: VAS [place] UPPER EXT VEIN MAPPING  (PRE-OP AVF)  Result Date: 09/08/2023 UPPER EXTREMITY VEIN MAPPING Patient Name:  Curtis Hartman  Date of Exam:   09/08/2023 Medical Rec #: [id number]         Accession #:    [id number] Date of Birth: 12/02/1960         Patient Gender: M Patient Age:   44 years Exam Location:  [facility] Procedure:      VAS [place] UPPER EXT VEIN MAPPING (PRE-OP AVF) Referring Phys: Jomarie Longs Karo Rog --------------------------------------------------------------------------------  Indications: Pre-access. Performing Technologist: Fernande Bras Supporting Technologist: Marilynne Halsted RDMS, RVT  Examination Guidelines: A complete evaluation includes B-mode imaging, spectral Doppler, color Doppler, and power Doppler as needed of all accessible portions of each vessel. Bilateral testing is considered an integral part of a complete examination. Limited examinations for reoccurring indications may be performed as noted. +-----------------+-------------+----------+---------+ Right Cephalic   Diameter (cm)Depth (cm)Findings  +-----------------+-------------+----------+---------+ Shoulder             0.37        0.57             +-----------------+-------------+----------+---------+ Prox upper arm       0.33        0.53             +-----------------+-------------+----------+---------+ Mid upper arm        0.32  0.30             +-----------------+-------------+----------+---------+ Dist upper arm       0.27        0.24             +-----------------+-------------+----------+---------+ Antecubital fossa    0.37        0.24   branching +-----------------+-------------+----------+---------+ Prox forearm         0.27        0.21             +-----------------+-------------+----------+---------+ Mid forearm          0.26        0.20             +-----------------+-------------+----------+---------+ Dist forearm         0.20        0.16              +-----------------+-------------+----------+---------+ Wrist                0.23        0.17             +-----------------+-------------+----------+---------+ +-----------------+-------------+----------+---------+ Right Basilic    Diameter (cm)Depth (cm)Findings  +-----------------+-------------+----------+---------+ Shoulder             0.57                         +-----------------+-------------+----------+---------+ Prox upper arm       0.47               branching +-----------------+-------------+----------+---------+ Mid upper arm        0.42                         +-----------------+-------------+----------+---------+ Dist upper arm       0.40                         +-----------------+-------------+----------+---------+ Antecubital fossa    0.45               branching +-----------------+-------------+----------+---------+ Prox forearm         0.15                         +-----------------+-------------+----------+---------+ Mid forearm          0.18                         +-----------------+-------------+----------+---------+ Distal forearm       0.19                         +-----------------+-------------+----------+---------+ Wrist                0.14                         +-----------------+-------------+----------+---------+ +-----------------+-------------+----------+-------------------+ Left Cephalic    Diameter (cm)Depth (cm)     Findings       +-----------------+-------------+----------+-------------------+ Shoulder             0.45        1.00                       +-----------------+-------------+----------+-------------------+ Prox upper arm       0.39  0.67                       +-----------------+-------------+----------+-------------------+ Mid upper arm        0.30        0.41                       +-----------------+-------------+----------+-------------------+ Dist upper arm       0.26        0.22                        +-----------------+-------------+----------+-------------------+ Antecubital fossa    0.42        0.22                       +-----------------+-------------+----------+-------------------+ Prox forearm         0.37        0.34                       +-----------------+-------------+----------+-------------------+ Mid forearm          0.30        0.34                       +-----------------+-------------+----------+-------------------+ Dist forearm         0.30        0.23   Thrombus visualized +-----------------+-------------+----------+-------------------+ Wrist                0.30        0.24                       +-----------------+-------------+----------+-------------------+ +-----------------+-------------+----------+--------+ Left Basilic     Diameter (cm)Depth (cm)Findings +-----------------+-------------+----------+--------+ Shoulder             0.58                        +-----------------+-------------+----------+--------+ Prox upper arm       0.39                        +-----------------+-------------+----------+--------+ Mid upper arm        0.26                        +-----------------+-------------+----------+--------+ Dist upper arm       0.36                        +-----------------+-------------+----------+--------+ Antecubital fossa    0.14                        +-----------------+-------------+----------+--------+ Prox forearm         0.13                        +-----------------+-------------+----------+--------+ Mid forearm          0.14                        +-----------------+-------------+----------+--------+ Distal forearm       0.14                        +-----------------+-------------+----------+--------+ Wrist  0.19                        +-----------------+-------------+----------+--------+ *See table(s) above for measurements and observations.  Diagnosing physician:     Preliminary     amLODipine  10 mg Oral Daily   calcitRIOL  0.25 mcg Oral Once per day on Monday Wednesday Friday   calcium acetate  667 mg Oral BID WC   carvedilol  3.125 mg Oral BID WC   Chlorhexidine Gluconate Cloth  6 each Topical Daily   docusate sodium  100 mg Oral BID   fluticasone furoate-vilanterol  1 puff Inhalation Daily   And   umeclidinium bromide  1 puff Inhalation Daily   gabapentin  400 mg Oral BID   heparin injection (subcutaneous)  5,000 Units Subcutaneous Q8H   insulin aspart  0-15 Units Subcutaneous TID WC   insulin aspart  0-5 Units Subcutaneous QHS   sodium bicarbonate  650 mg Oral TID   sodium chloride flush  3 mL Intravenous Q12H   tamsulosin  0.4 mg Oral Daily    BMET    Component Value Date/Time   NA 137 09/09/2023 0554   K 4.9 09/09/2023 0554   CL 107 09/09/2023 0554   CO2 22 09/09/2023 0554   GLUCOSE 131 (H) 09/09/2023 0554   BUN 92 (H) 09/09/2023 0554   CREATININE 5.27 (H) 09/09/2023 0554   CREATININE 2.78 (H) 11/27/2022 1317   CALCIUM 8.0 (L) 09/09/2023 0554   GFRNONAA 12 (L) 09/09/2023 0554   CBC    Component Value Date/Time   WBC 11.6 (H) 09/04/2023 0737   RBC 2.74 (L) 09/04/2023 0737   HGB 8.0 (L) 09/04/2023 0737   HCT 24.6 (L) 09/04/2023 0737   PLT 243 09/04/2023 0737   MCV 89.8 09/04/2023 0737   MCH 29.2 09/04/2023 0737   MCHC 32.5 09/04/2023 0737   RDW 13.5 09/04/2023 0737   LYMPHSABS 1,723 11/27/2022 1317   EOSABS 218 11/27/2022 1317   BASOSABS 61 11/27/2022 1317    Pt with PMH significant for COPD, HTN, DM type II, IBS, multi-level DDD, and 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 Wolfgang Phoenix) who presented for lumbar interbody fusion of L2-L3, L3-L4, L4-L5 on 09/01/23.  His hospital course was complicated by AKI/CKD stage IV and we were consulted to further evaluate and manage his AKI.  He has had progressive CKD and was told by Dr. Wolfgang Phoenix that he was  nearing the need for dialysis at his last visit 2 weeks ago.  He denies any N/V/dysgeusia, anorexia, lower extremity edema.   Assessment and plan: AKI/CKD stage IV - likely multifactorial with contrast nephropathy, ischemic ATN, ABLA with concomitant ARB therapy, acute urinary retention.  Olmesartan is on hold.  He did receive IVF's after surgery but had several episodes of diarrhea over the past 72 hours.  No uremic symptoms or indication for dialysis.  Despite starting IVF's yesterday his I's/O's remain negative by 2 liters.  Will increase rate of IVF's and and follow UOP and daily SCr.  Hopefully his Scr will reach a plateau and improve off of ARB and increased intake.  - Avoid nephrotoxic medications including NSAIDs and iodinated intravenous contrast exposure unless the latter is absolutely indicated.   Preferred narcotic agents for pain control are hydromorphone, fentanyl, and methadone. Morphine should not be used.  Avoid Baclofen and avoid oral sodium phosphate and magnesium citrate based laxatives /  bowel preps.  Continue strict Input and Output monitoring. Will monitor the patient closely with you and intervene or adjust therapy as indicated by changes in clinical status/labs  Vascular access He is nearing the need for HD and have consulted [facility] for vein mapping and for LUE AVF creation tomorrow with Dr. Hetty Blend.   Acute urinary retention - improved UOP after foley catheter placed.  Currently on flomax.  Continue with foley catheter HTN/Volume - stable for now. ABLA - follow H/H and transfuse for hgb <7.  Will start ESA and check iron stores. 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  Now with hyperphosphatemia.  Continue with phoslo qac and follow. DM type II - per primary Lumbar stenosis - s/p lumbar spine fusion.  Per Neurosurgery. Metabolic acidosis - continue with home sodium bicarb dose and follow.    Irena Cords, MD [facility]

## 2023-09-09 NOTE — Progress Notes (Signed)
OT Cancellation Note  Patient Details Name: Curtis Hartman MRN: [id number] DOB: 11-29-1960   Cancelled Treatment:    Reason Eval/Treat Not Completed: Pain limiting ability to participate- pt reports just returning back to bed and with increased pain.  Requests OT to return after he receives pain meds.  Will follow and see as able.   Barry Brunner, OT Acute Rehabilitation Services Office [phone]   Chancy Milroy 09/09/2023, 9:33 AM

## 2023-09-09 NOTE — Progress Notes (Signed)
Subjective: Patient reports making good progress with walking, pain control  Objective: Vital signs in last 24 hours: Temp:  [98.9 F (37.2 C)-99.4 F (37.4 C)] 98.9 F (37.2 C) (11/06 0903) Pulse Rate:  [78-85] 79 (11/06 0903) Resp:  [16-18] 18 (11/06 0903) BP: (114-121)/(62-73) 114/62 (11/06 0903) SpO2:  [97 %-100 %] 98 % (11/06 0903)  Intake/Output from previous day: 11/05 0701 - 11/06 0700 In: 240 [P.O.:240] Out: 2200 [Urine:2200] Intake/Output this shift: No intake/output data recorded.  NAD, sitting in bed Foley in place Incisions c/d  Lab Results: No results for input(s): "WBC", "HGB", "HCT", "PLT" in the last 72 hours. BMET Recent Labs    09/08/23 0634 09/09/23 0554  NA 136 137  K 4.6 4.9  CL 107 107  CO2 21* 22  GLUCOSE 115* 131*  BUN 83* 92*  CREATININE 5.11* 5.27*  CALCIUM 8.1* 8.0*    Studies/Results: VAS [place] UPPER EXT VEIN MAPPING (PRE-OP AVF)  Result Date: 09/08/2023 UPPER EXTREMITY VEIN MAPPING Patient Name:  Curtis Hartman  Date of Exam:   09/08/2023 Medical Rec #: [id number]         Accession #:    [id number] Date of Birth: Apr 12, 1961         Patient Gender: M Patient Age:   62 years Exam Location:  [facility] Procedure:      VAS [place] UPPER EXT VEIN MAPPING (PRE-OP AVF) Referring Phys: Jomarie Longs [place] --------------------------------------------------------------------------------  Indications: Pre-access. Performing Technologist: Fernande Bras Supporting Technologist: Marilynne Halsted RDMS, RVT  Examination Guidelines: A complete evaluation includes B-mode imaging, spectral Doppler, color Doppler, and power Doppler as needed of all accessible portions of each vessel. Bilateral testing is considered an integral part of a complete examination. Limited examinations for reoccurring indications may be performed as noted. +-----------------+-------------+----------+---------+ Right Cephalic   Diameter (cm)Depth (cm)Findings   +-----------------+-------------+----------+---------+ Shoulder             0.37        0.57             +-----------------+-------------+----------+---------+ Prox upper arm       0.33        0.53             +-----------------+-------------+----------+---------+ Mid upper arm        0.32        0.30             +-----------------+-------------+----------+---------+ Dist upper arm       0.27        0.24             +-----------------+-------------+----------+---------+ Antecubital fossa    0.37        0.24   branching +-----------------+-------------+----------+---------+ Prox forearm         0.27        0.21             +-----------------+-------------+----------+---------+ Mid forearm          0.26        0.20             +-----------------+-------------+----------+---------+ Dist forearm         0.20        0.16             +-----------------+-------------+----------+---------+ Wrist                0.23        0.17             +-----------------+-------------+----------+---------+ +-----------------+-------------+----------+---------+  Right Basilic    Diameter (cm)Depth (cm)Findings  +-----------------+-------------+----------+---------+ Shoulder             0.57                         +-----------------+-------------+----------+---------+ Prox upper arm       0.47               branching +-----------------+-------------+----------+---------+ Mid upper arm        0.42                         +-----------------+-------------+----------+---------+ Dist upper arm       0.40                         +-----------------+-------------+----------+---------+ Antecubital fossa    0.45               branching +-----------------+-------------+----------+---------+ Prox forearm         0.15                         +-----------------+-------------+----------+---------+ Mid forearm          0.18                          +-----------------+-------------+----------+---------+ Distal forearm       0.19                         +-----------------+-------------+----------+---------+ Wrist                0.14                         +-----------------+-------------+----------+---------+ +-----------------+-------------+----------+-------------------+ Left Cephalic    Diameter (cm)Depth (cm)     Findings       +-----------------+-------------+----------+-------------------+ Shoulder             0.45        1.00                       +-----------------+-------------+----------+-------------------+ Prox upper arm       0.39        0.67                       +-----------------+-------------+----------+-------------------+ Mid upper arm        0.30        0.41                       +-----------------+-------------+----------+-------------------+ Dist upper arm       0.26        0.22                       +-----------------+-------------+----------+-------------------+ Antecubital fossa    0.42        0.22                       +-----------------+-------------+----------+-------------------+ Prox forearm         0.37        0.34                       +-----------------+-------------+----------+-------------------+ Mid forearm          0.30  0.34                       +-----------------+-------------+----------+-------------------+ Dist forearm         0.30        0.23   Thrombus visualized +-----------------+-------------+----------+-------------------+ Wrist                0.30        0.24                       +-----------------+-------------+----------+-------------------+ +-----------------+-------------+----------+--------+ Left Basilic     Diameter (cm)Depth (cm)Findings +-----------------+-------------+----------+--------+ Shoulder             0.58                        +-----------------+-------------+----------+--------+ Prox upper arm       0.39                         +-----------------+-------------+----------+--------+ Mid upper arm        0.26                        +-----------------+-------------+----------+--------+ Dist upper arm       0.36                        +-----------------+-------------+----------+--------+ Antecubital fossa    0.14                        +-----------------+-------------+----------+--------+ Prox forearm         0.13                        +-----------------+-------------+----------+--------+ Mid forearm          0.14                        +-----------------+-------------+----------+--------+ Distal forearm       0.14                        +-----------------+-------------+----------+--------+ Wrist                0.19                        +-----------------+-------------+----------+--------+ *See table(s) above for measurements and observations.  Diagnosing physician:    Preliminary     Assessment/Plan: S/p DLIF L2-5  LOS: 8 days  - cont supportive care - likely rehab   Bedelia Person 09/09/2023, 9:34 AM

## 2023-09-09 NOTE — Progress Notes (Signed)
Physical Therapy Treatment Patient Details Name: Curtis Hartman MRN: [id number] DOB: [birth year] Today's Date: 09/09/2023   History of Present Illness Pt is a 62 y.o. male s/p DLIF L2-5 on 09/01/23. PMH: s/p ACDF (Jan 2024), OA, HTN    PT Comments  Pt received seated on BSC, pt agreeable to therapy session and with good participation and fair tolerance for transfer training. Pt able to perform his own anterior peri-care but standing tolerance limited due to frequent loose stools, with pt needing to sit back down on BSC. Pt needs cues for safety with transfers and reports pain is severe throughout session. NT/RN notified of pt BSC being full and pt not yet done with BM. Plan to progress gait with chair follow for safety next session if loose stools are resolved. Pt continues to benefit from PT services to progress toward functional mobility goals.     If plan is discharge home, recommend the following: A little help with walking and/or transfers;A little help with bathing/dressing/bathroom;Assistance with cooking/housework;Assist for transportation;Help with stairs or ramp for entrance;Supervision due to cognitive status   Can travel by private vehicle        Equipment Recommendations  Rolling walker (2 wheels)    Recommendations for Other Services       Precautions / Restrictions Precautions Precautions: Back;Other (comment) Precaution Booklet Issued: Yes (comment) Precaution Comments: foley, bring briefs Required Braces or Orthoses: Spinal Brace Spinal Brace: Lumbar corset;Applied in sitting position Restrictions Weight Bearing Restrictions: No     Mobility  Bed Mobility               General bed mobility comments: OOB on BSC upon entry    Transfers Overall transfer level: Needs assistance Equipment used: Rolling walker (2 wheels) Transfers: Sit to/from Stand Sit to Stand: Min assist           General transfer comment: Cues for safe BUE placement and posture;  STS x 2 from [facility], slow to initiate, pt with loose stools upon standing so had to sit back down to continue BM    Ambulation/Gait               General Gait Details: Defer, pt with diarrhea each time standing and not safe to progress gait at time of session.   Stairs             Wheelchair Mobility     Tilt Bed    Modified Rankin (Stroke Patients Only)       Balance Overall balance assessment: Needs assistance Sitting-balance support: No upper extremity supported, Feet supported Sitting balance-Leahy Scale: Fair Sitting balance - Comments: sitting forward in chair; pt tends to lean back due to back discomfort   Standing balance support: Bilateral upper extremity supported, During functional activity Standing balance-Leahy Scale: Poor Standing balance comment: BUE support on RW                            Cognition Arousal: Alert Behavior During Therapy: WFL for tasks assessed/performed Overall Cognitive Status: Impaired/Different from baseline Area of Impairment: Awareness, Problem solving                           Awareness: Emergent Problem Solving: Slow processing, Requires verbal cues      Pt requesting more coffee although he has uncontrolled loose stools during session.     Exercises      General Comments  General comments (skin integrity, edema, etc.): no acute s/sx distress other than loose stools and c/o pain LSO brace cords adjusted to be shorter for better fit      Pertinent Vitals/Pain Pain Assessment Pain Assessment: Faces Pain Score: 10-Worst pain ever Faces Pain Scale: Hurts even more Pain Location: back Pain Descriptors / Indicators: Grimacing, Guarding, Sore Pain Intervention(s): Monitored during session, Limited activity within patient's tolerance, Repositioned    Home Living                          Prior Function            PT Goals (current goals can now be found in the care plan section)  Acute Rehab PT Goals Patient Stated Goal: to improve walking and return home PT Goal Formulation: With patient Time For Goal Achievement: 09/17/23 Progress towards PT goals: Progressing toward goals    Frequency    Min 5X/week      PT Plan      Co-evaluation              AM-PAC PT "6 Clicks" Mobility   Outcome Measure  Help needed turning from your back to your side while in a flat bed without using bedrails?: A Little Help needed moving from lying on your back to sitting on the side of a flat bed without using bedrails?: A Little Help needed moving to and from a bed to a chair (including a wheelchair)?: A Little Help needed standing up from a chair using your arms (e.g., wheelchair or bedside chair)?: A Little Help needed to walk in hospital room?: A Lot Help needed climbing 3-5 steps with a railing? : Total 6 Click Score: 15    End of Session Equipment Utilized During Treatment: Gait belt;Back brace Activity Tolerance: Treatment limited secondary to medical complications (Comment);Other (comment);Patient limited by pain (limited by frequent loose stools) Patient left: in chair;with call bell/phone within reach;Other (comment) (pt up on [facility], call bell next to him, NT notified) Nurse Communication: Mobility status;Other (comment) (significant loose stools, needs a lot of clean-up) PT Visit Diagnosis: Other abnormalities of gait and mobility (R26.89);Muscle weakness (generalized) (M62.81);Pain Pain - part of body:  ("entire" low back)     Time: 1610-9604 PT Time Calculation (min) (ACUTE ONLY): 21 min  Charges:    $Therapeutic Activity: 8-22 mins PT General Charges $$ ACUTE PT VISIT: 1 Visit                     Toniette Devera P., PTA Acute Rehabilitation Services Secure Chat Preferred 9a-5:30pm Office: [phone]    [facility] 09/09/2023, 4:14 PM

## 2023-09-09 NOTE — Progress Notes (Signed)
Occupational Therapy Treatment Patient Details Name: Curtis Hartman MRN: [id number] DOB: 10-10-1961 Today's Date: 09/09/2023   History of present illness Pt is a 62 y.o. male s/p DLIF L2-5 on 09/01/23. PMH: s/p ACDF (Jan 2024), OA, HTN   OT comments  Patient on [facility] agreeable to OT session.  Educated on wearing clothing under brace, pt verbalized understanding.   Pt completing transfers with min assist, cueing for hand placement and posture.  Relies on BUE support in standing but able to use AE for LB dressing to complete with min assist.  Pt continues to report pain in back throughout session, even when premedicated.  Will follow acutely.       If plan is discharge home, recommend the following:  Assistance with cooking/housework;Assist for transportation;A lot of help with walking and/or transfers;A little help with bathing/dressing/bathroom   Equipment Recommendations  None recommended by OT    Recommendations for Other Services Rehab consult    Precautions / Restrictions Precautions Precautions: Back;Other (comment) Precaution Booklet Issued: Yes (comment) Precaution Comments: foley Required Braces or Orthoses: Spinal Brace Spinal Brace: Lumbar corset;Applied in sitting position Restrictions Weight Bearing Restrictions: No       Mobility Bed Mobility               General bed mobility comments: OOB on BSC upon entry    Transfers Overall transfer level: Needs assistance Equipment used: Rolling walker (2 wheels) Transfers: Sit to/from Stand Sit to Stand: Min assist           General transfer comment: hand placement and posture     Balance Overall balance assessment: Needs assistance Sitting-balance support: No upper extremity supported, Feet supported Sitting balance-Leahy Scale: Fair Sitting balance - Comments: sitting on EOB   Standing balance support: Bilateral upper extremity supported, During functional activity Standing balance-Leahy Scale:  Poor Standing balance comment: BUE support on RW, pt declines to attempt 1 hand support                           ADL either performed or assessed with clinical judgement   ADL Overall ADL's : Needs assistance/impaired                 Upper Body Dressing : Minimal assistance;Sitting Upper Body Dressing Details (indicate cue type and reason): brace Lower Body Dressing: Minimal assistance;Sit to/from stand;With adaptive equipment Lower Body Dressing Details (indicate cue type and reason): used AE, min assist to stand and support to managing clothing over hips Toilet Transfer: Minimal assistance;BSC/3in1;Rolling walker (2 wheels)   Toileting- Clothing Manipulation and Hygiene: Moderate assistance;Sit to/from stand Toileting - Clothing Manipulation Details (indicate cue type and reason): hygiene, clothing mgmt     Functional mobility during ADLs: Contact guard assist;Rolling walker (2 wheels);Cueing for safety      Extremity/Trunk Assessment              Vision       Perception     Praxis      Cognition Arousal: Alert Behavior During Therapy: WFL for tasks assessed/performed Overall Cognitive Status: Impaired/Different from baseline Area of Impairment: Awareness, Problem solving                           Awareness: Emergent Problem Solving: Slow processing, Requires verbal cues          Exercises      Shoulder Instructions  General Comments      Pertinent Vitals/ Pain       Pain Assessment Pain Assessment: Faces Pain Score: 10-Worst pain ever Faces Pain Scale: Hurts even more Pain Location: back Pain Descriptors / Indicators: Grimacing, Guarding, Sore Pain Intervention(s): Limited activity within patient's tolerance, Monitored during session, Repositioned, Premedicated before session  Home Living                                          Prior Functioning/Environment              Frequency  Min  1X/week        Progress Toward Goals  OT Goals(current goals can now be found in the care plan section)  Progress towards OT goals: Progressing toward goals  Acute Rehab OT Goals Patient Stated Goal: less pain OT Goal Formulation: With patient Time For Goal Achievement: 09/17/23 Potential to Achieve Goals: Good  Plan      Co-evaluation                 AM-PAC OT "6 Clicks" Daily Activity     Outcome Measure   Help from another person eating meals?: A Little Help from another person taking care of personal grooming?: A Little Help from another person toileting, which includes using toliet, bedpan, or urinal?: A Lot Help from another person bathing (including washing, rinsing, drying)?: A Little Help from another person to put on and taking off regular upper body clothing?: A Little Help from another person to put on and taking off regular lower body clothing?: A Little 6 Click Score: 17    End of Session Equipment Utilized During Treatment: Rolling walker (2 wheels);Back brace;Gait belt  OT Visit Diagnosis: Other abnormalities of gait and mobility (R26.89);Muscle weakness (generalized) (M62.81);Pain Pain - part of body:  (back)   Activity Tolerance Patient tolerated treatment well   Patient Left in chair;with call bell/phone within reach;with chair alarm set   Nurse Communication Mobility status        Time: 7253-6644 OT Time Calculation (min): 30 min  Charges: OT General Charges $OT Visit: 1 Visit OT Treatments $Self Care/Home Management : 23-37 mins  Curtis Hartman, OT Acute Rehabilitation Services Office [phone]   Curtis Hartman 09/09/2023, 1:16 PM

## 2023-09-09 NOTE — Progress Notes (Signed)
PROGRESS NOTE    Curtis Hartman  NFA:[id number] DOB: March 01, 1961 DOA: 09/01/2023 PCP: Wilmon Pali, FNP    Brief Narrative:   Curtis Hartman is a 62 y.o. male with past medical history significant for COPD, HTN, type 2 diabetes mellitus, CKD stage IV, lumbar spinal stenosis with radiculopathy admitted on 10/29 by neurosurgery who underwent L2-5 DLIF, percutaneous instrumentation by Dr. Maisie Fus on 09/01/2023.  Hospital course complicated by acute renal failure on CKD stage IV; hospitalist team consulted on 10/31 for assistance with medical management.  Nephrology consulted on 10/31.  Assessment & Plan:   Acute renal failure on CKD stage IV Etiology likely multifactorial with contrast nephropathy, ischemic ATN, ABLA with con commitment ARB therapy, acute urinary retention.  Renal ultrasound with no hydronephrosis or nephrolithiasis, increased renal parenchymal echogenicity seen with medical renal disease. -- Nephrology following, appreciate assistance -- ARB discontinued -- Cr 4.84>>5.19>5.03>5.11>5.27 -- Strict I's and O's, 2.2L out past 24h -- Avoid nephrotoxins, renally dose all medications -- continue Foley catheter -- BMP daily -- Vascular surgery consulted for vascular access, LUE AVF planned 11/7 with Dr. Hetty Blend  Hypertensive emergency, resolved -- Amlodipine 10 mg p.o. daily -- Carvedilol 0.125 mg p.o. twice daily  Lumbar spinal stenosis with radiculopathy Initially admitted on 09/01/2023 by neurosurgery and underwent L2-5 TLIF with percutaneous administration by Dr. Maisie Fus. --Further per neurosurgery  COPD -- Breo Ellipta 1 puff daily -- Incruse Ellipta 1 puff daily  Type 2 diabetes mellitus Home regimen includes Lantus 5 units daily nightly as needed for blood sugar over 150.  A1c 5.7 on 08/26/2023, well-controlled.  BPH -- Tamsulosin 0.4 mg p.o. daily   DVT prophylaxis: heparin injection 5,000 Units Start: 09/04/23 1715 SCD's Start: 09/01/23 1935    Code  Status: Full Code Family Communication: No family present at bedside  Disposition Plan:  Level of care: Med-Surg Status is: Inpatient Remains inpatient appropriate because: Per primary, pending CIR     Subjective: Patient seen examined bedside, resting company.  Lying in bed.  Continues to complain of generalized weakness, back pain.  Seen by vascular surgery this morning with planned LUE AVF placement tomorrow.  Awaiting discharge to CIR.  Denies headache, no visual changes, no chest pain, no shortness of breath, no abdominal pain, no fever/chills/night sweats, no nausea/vomiting/diarrhea, no focal weakness, no fatigue, no paresthesias.  No acute events overnight per nursing.  Objective: Vitals:   09/08/23 1517 09/08/23 2055 09/09/23 0829 09/09/23 0903  BP: 115/65 121/73  114/62  Pulse: 82 85 78 79  Resp: 17 18 16 18   Temp: 98.9 F (37.2 C) 99.4 F (37.4 C)  98.9 F (37.2 C)  TempSrc: Oral Oral  Oral  SpO2: 100% 100% 97% 98%  Weight:      Height:        Intake/Output Summary (Last 24 hours) at 09/09/2023 1232 Last data filed at 09/09/2023 0630 Gross per 24 hour  Intake --  Output 1600 ml  Net -1600 ml   Filed Weights   09/01/23 0819  Weight: 71.2 kg    Examination:  Physical Exam: GEN: NAD, alert and oriented x 3, chronically ill appearance HEENT: NCAT, PERRL, EOMI, sclera clear, MMM PULM: CTAB w/o wheezes/crackles, normal respiratory effort on room air CV: RRR w/o M/G/R GI: abd soft, NTND, NABS, no R/G/M MSK: no peripheral edema, moves all extremities independently NEURO: No focal neurological deficits PSYCH: normal mood/affect Integumentary: No concerning rashes/lesions/wounds noted on exposed skin surfaces    Data Reviewed: I have personally  reviewed following labs and imaging studies  CBC: Recent Labs  Lab 09/04/23 0737  WBC 11.6*  HGB 8.0*  HCT 24.6*  MCV 89.8  PLT 243   Basic Metabolic Panel: Recent Labs  Lab 09/05/23 0322 09/06/23 0317  09/07/23 0751 09/08/23 0634 09/09/23 0554  NA 137 140 139 136 137  K 3.7 3.8 4.0 4.6 4.9  CL 104 110 110 107 107  CO2 19* 20* 19* 21* 22  GLUCOSE 127* 128* 121* 115* 131*  BUN 76* 82* 81* 83* 92*  CREATININE 4.85* 5.19* 5.03* 5.11* 5.27*  CALCIUM 8.1* 8.0* 8.0* 8.1* 8.0*   GFR: Estimated Creatinine Clearance: 14.5 mL/min (A) (by C-G formula based on SCr of 5.27 mg/dL (H)). Liver Function Tests: No results for input(s): "AST", "ALT", "ALKPHOS", "BILITOT", "PROT", "ALBUMIN" in the last 168 hours. No results for input(s): "LIPASE", "AMYLASE" in the last 168 hours. No results for input(s): "AMMONIA" in the last 168 hours. Coagulation Profile: No results for input(s): "INR", "PROTIME" in the last 168 hours. Cardiac Enzymes: No results for input(s): "CKTOTAL", "CKMB", "CKMBINDEX", "TROPONINI" in the last 168 hours. BNP (last 3 results) No results for input(s): "PROBNP" in the last 8760 hours. HbA1C: No results for input(s): "HGBA1C" in the last 72 hours. CBG: Recent Labs  Lab 09/08/23 1132 09/08/23 1613 09/08/23 2149 09/09/23 0726 09/09/23 1117  GLUCAP 134* 157* 163* 123* 115*   Lipid Profile: No results for input(s): "CHOL", "HDL", "LDLCALC", "TRIG", "CHOLHDL", "LDLDIRECT" in the last 72 hours. Thyroid Function Tests: No results for input(s): "TSH", "T4TOTAL", "FREET4", "T3FREE", "THYROIDAB" in the last 72 hours. Anemia Panel: No results for input(s): "VITAMINB12", "FOLATE", "FERRITIN", "TIBC", "IRON", "RETICCTPCT" in the last 72 hours. Sepsis Labs: No results for input(s): "PROCALCITON", "LATICACIDVEN" in the last 168 hours.  No results found for this or any previous visit (from the past 240 hour(s)).       Radiology Studies: VAS [place] UPPER EXT VEIN MAPPING (PRE-OP AVF)  Result Date: 09/08/2023 UPPER EXTREMITY VEIN MAPPING Patient Name:  Curtis Hartman  Date of Exam:   09/08/2023 Medical Rec #: [id number]         Accession #:    [id number] Date of Birth: Jul 28, 1961          Patient Gender: M Patient Age:   53 years Exam Location:  [facility] Procedure:      VAS [place] UPPER EXT VEIN MAPPING (PRE-OP AVF) Referring Phys: Jomarie Longs [place] --------------------------------------------------------------------------------  Indications: Pre-access. Performing Technologist: Fernande Bras Supporting Technologist: Marilynne Halsted RDMS, RVT  Examination Guidelines: A complete evaluation includes B-mode imaging, spectral Doppler, color Doppler, and power Doppler as needed of all accessible portions of each vessel. Bilateral testing is considered an integral part of a complete examination. Limited examinations for reoccurring indications may be performed as noted. +-----------------+-------------+----------+---------+ Right Cephalic   Diameter (cm)Depth (cm)Findings  +-----------------+-------------+----------+---------+ Shoulder             0.37        0.57             +-----------------+-------------+----------+---------+ Prox upper arm       0.33        0.53             +-----------------+-------------+----------+---------+ Mid upper arm        0.32        0.30             +-----------------+-------------+----------+---------+ Dist upper arm       0.27  0.24             +-----------------+-------------+----------+---------+ Antecubital fossa    0.37        0.24   branching +-----------------+-------------+----------+---------+ Prox forearm         0.27        0.21             +-----------------+-------------+----------+---------+ Mid forearm          0.26        0.20             +-----------------+-------------+----------+---------+ Dist forearm         0.20        0.16             +-----------------+-------------+----------+---------+ Wrist                0.23        0.17             +-----------------+-------------+----------+---------+ +-----------------+-------------+----------+---------+ Right Basilic    Diameter  (cm)Depth (cm)Findings  +-----------------+-------------+----------+---------+ Shoulder             0.57                         +-----------------+-------------+----------+---------+ Prox upper arm       0.47               branching +-----------------+-------------+----------+---------+ Mid upper arm        0.42                         +-----------------+-------------+----------+---------+ Dist upper arm       0.40                         +-----------------+-------------+----------+---------+ Antecubital fossa    0.45               branching +-----------------+-------------+----------+---------+ Prox forearm         0.15                         +-----------------+-------------+----------+---------+ Mid forearm          0.18                         +-----------------+-------------+----------+---------+ Distal forearm       0.19                         +-----------------+-------------+----------+---------+ Wrist                0.14                         +-----------------+-------------+----------+---------+ +-----------------+-------------+----------+-------------------+ Left Cephalic    Diameter (cm)Depth (cm)     Findings       +-----------------+-------------+----------+-------------------+ Shoulder             0.45        1.00                       +-----------------+-------------+----------+-------------------+ Prox upper arm       0.39        0.67                       +-----------------+-------------+----------+-------------------+ Mid upper arm  0.30        0.41                       +-----------------+-------------+----------+-------------------+ Dist upper arm       0.26        0.22                       +-----------------+-------------+----------+-------------------+ Antecubital fossa    0.42        0.22                       +-----------------+-------------+----------+-------------------+ Prox forearm         0.37         0.34                       +-----------------+-------------+----------+-------------------+ Mid forearm          0.30        0.34                       +-----------------+-------------+----------+-------------------+ Dist forearm         0.30        0.23   Thrombus visualized +-----------------+-------------+----------+-------------------+ Wrist                0.30        0.24                       +-----------------+-------------+----------+-------------------+ +-----------------+-------------+----------+--------+ Left Basilic     Diameter (cm)Depth (cm)Findings +-----------------+-------------+----------+--------+ Shoulder             0.58                        +-----------------+-------------+----------+--------+ Prox upper arm       0.39                        +-----------------+-------------+----------+--------+ Mid upper arm        0.26                        +-----------------+-------------+----------+--------+ Dist upper arm       0.36                        +-----------------+-------------+----------+--------+ Antecubital fossa    0.14                        +-----------------+-------------+----------+--------+ Prox forearm         0.13                        +-----------------+-------------+----------+--------+ Mid forearm          0.14                        +-----------------+-------------+----------+--------+ Distal forearm       0.14                        +-----------------+-------------+----------+--------+ Wrist                0.19                        +-----------------+-------------+----------+--------+ *See table(s) above for measurements  and observations.  Diagnosing physician:    Preliminary         Scheduled Meds:  amLODipine  10 mg Oral Daily   calcitRIOL  0.25 mcg Oral Once per day on Monday Wednesday Friday   calcium acetate  667 mg Oral BID WC   carvedilol  3.125 mg Oral BID WC   Chlorhexidine Gluconate  Cloth  6 each Topical Daily   darbepoetin (ARANESP) injection - NON-DIALYSIS  60 mcg Subcutaneous Q Wed-1800   docusate sodium  100 mg Oral BID   fluticasone furoate-vilanterol  1 puff Inhalation Daily   And   umeclidinium bromide  1 puff Inhalation Daily   gabapentin  400 mg Oral BID   heparin injection (subcutaneous)  5,000 Units Subcutaneous Q8H   insulin aspart  0-15 Units Subcutaneous TID WC   insulin aspart  0-5 Units Subcutaneous QHS   sodium bicarbonate  650 mg Oral TID   sodium chloride flush  3 mL Intravenous Q12H   tamsulosin  0.4 mg Oral Daily   Continuous Infusions:  sodium bicarbonate 75 mEq in sodium chloride 0.45 % 1,075 mL infusion 100 mL/hr at 09/09/23 1202     LOS: 8 days    Time spent: 51 minutes spent on chart review, discussion with nursing staff, consultants, updating family and interview/physical exam; more than 50% of that time was spent in counseling and/or coordination of care.    Alvira Philips [place], DO [facility] Available via Epic secure chat 7am-7pm After these hours, please refer to coverage provider listed on [email or web address] 09/09/2023, 12:32 PM

## 2023-09-09 NOTE — Plan of Care (Signed)

## 2023-09-10 ENCOUNTER — Encounter (HOSPITAL_COMMUNITY): Payer: Self-pay | Admitting: Neurosurgery

## 2023-09-10 ENCOUNTER — Ambulatory Visit: Payer: Medicaid Other | Admitting: Internal Medicine

## 2023-09-10 ENCOUNTER — Encounter (HOSPITAL_COMMUNITY)
Admission: RE | Disposition: A | Discharge status: Rehabilitation Facility | Payer: Self-pay | Source: Home / Self Care | Attending: Neurosurgery

## 2023-09-10 ENCOUNTER — Inpatient Hospital Stay (HOSPITAL_COMMUNITY): Payer: Self-pay | Admitting: Anesthesiology

## 2023-09-10 ENCOUNTER — Other Ambulatory Visit: Payer: Self-pay

## 2023-09-10 DIAGNOSIS — M48062 Spinal stenosis, lumbar region with neurogenic claudication: Secondary | ICD-10-CM | POA: Diagnosis not present

## 2023-09-10 DIAGNOSIS — F17210 Nicotine dependence, cigarettes, uncomplicated: Secondary | ICD-10-CM

## 2023-09-10 DIAGNOSIS — N186 End stage renal disease: Secondary | ICD-10-CM

## 2023-09-10 DIAGNOSIS — E1122 Type 2 diabetes mellitus with diabetic chronic kidney disease: Secondary | ICD-10-CM

## 2023-09-10 DIAGNOSIS — I120 Hypertensive chronic kidney disease with stage 5 chronic kidney disease or end stage renal disease: Secondary | ICD-10-CM

## 2023-09-10 HISTORY — PX: AV FISTULA PLACEMENT: SHX1204

## 2023-09-10 LAB — CBC
HCT: 21.9 % — ABNORMAL LOW (ref 39.0–52.0)
Hemoglobin: 7 g/dL — ABNORMAL LOW (ref 13.0–17.0)
MCH: 29 pg (ref 26.0–34.0)
MCHC: 32 g/dL (ref 30.0–36.0)
MCV: 90.9 fL (ref 80.0–100.0)
Platelets: 307 10*3/uL (ref 150–400)
RBC: 2.41 MIL/uL — ABNORMAL LOW (ref 4.22–5.81)
RDW: 13 % (ref 11.5–15.5)
WBC: 8.9 10*3/uL (ref 4.0–10.5)
nRBC: 0 % (ref 0.0–0.2)

## 2023-09-10 LAB — POCT I-STAT, CHEM 8
BUN: 92 mg/dL — ABNORMAL HIGH (ref 8–23)
Calcium, Ion: 1.17 mmol/L (ref 1.15–1.40)
Chloride: 104 mmol/L (ref 98–111)
Creatinine, Ser: 4.8 mg/dL — ABNORMAL HIGH (ref 0.61–1.24)
Glucose, Bld: 99 mg/dL (ref 70–99)
HCT: 23 % — ABNORMAL LOW (ref 39.0–52.0)
Hemoglobin: 7.8 g/dL — ABNORMAL LOW (ref 13.0–17.0)
Potassium: 4.8 mmol/L (ref 3.5–5.1)
Sodium: 139 mmol/L (ref 135–145)
TCO2: 24 mmol/L (ref 22–32)

## 2023-09-10 LAB — GLUCOSE, CAPILLARY
Glucose-Capillary: 110 mg/dL — ABNORMAL HIGH (ref 70–99)
Glucose-Capillary: 79 mg/dL (ref 70–99)
Glucose-Capillary: 96 mg/dL (ref 70–99)
Glucose-Capillary: 144 mg/dL — ABNORMAL HIGH (ref 70–99)

## 2023-09-10 LAB — IRON AND TIBC
Iron: 45 ug/dL (ref 45–182)
Saturation Ratios: 23 % (ref 17.9–39.5)
TIBC: 195 ug/dL — ABNORMAL LOW (ref 250–450)
UIBC: 150 ug/dL

## 2023-09-10 LAB — RENAL FUNCTION PANEL
Albumin: 2.2 g/dL — ABNORMAL LOW (ref 3.5–5.0)
Anion gap: 10 (ref 5–15)
BUN: 89 mg/dL — ABNORMAL HIGH (ref 8–23)
CO2: 24 mmol/L (ref 22–32)
Calcium: 8.3 mg/dL — ABNORMAL LOW (ref 8.9–10.3)
Chloride: 105 mmol/L (ref 98–111)
Creatinine, Ser: 4.82 mg/dL — ABNORMAL HIGH (ref 0.61–1.24)
GFR, Estimated: 13 mL/min — ABNORMAL LOW (ref >60)
Glucose, Bld: 105 mg/dL — ABNORMAL HIGH (ref 70–99)
Phosphorus: 5.4 mg/dL — ABNORMAL HIGH (ref 2.5–4.6)
Potassium: 4.7 mmol/L (ref 3.5–5.1)
Sodium: 139 mmol/L (ref 135–145)

## 2023-09-10 LAB — PREPARE RBC (CROSSMATCH)

## 2023-09-10 SURGERY — ARTERIOVENOUS (AV) FISTULA CREATION
Anesthesia: Regional | Site: Arm Upper | Laterality: Left

## 2023-09-10 MED ORDER — ONDANSETRON HCL 4 MG/2ML IJ SOLN
INTRAMUSCULAR | Status: DC | PRN
  Administered 2023-09-10: 4 mg via INTRAVENOUS

## 2023-09-10 MED ORDER — SODIUM CHLORIDE 0.9 % IV SOLN
INTRAVENOUS | Status: DC | PRN
Start: 2023-09-10 — End: 2023-09-10

## 2023-09-10 MED ORDER — SODIUM CHLORIDE 0.9% IV SOLUTION: Freq: Once | INTRAVENOUS | Status: AC

## 2023-09-10 MED ORDER — ONDANSETRON HCL 4 MG/2ML IJ SOLN
INTRAMUSCULAR | Status: AC
  Filled 2023-09-10: qty 2

## 2023-09-10 MED ORDER — MIDAZOLAM HCL 2 MG/2ML IJ SOLN
INTRAMUSCULAR | Status: AC
  Filled 2023-09-10: qty 2

## 2023-09-10 MED ORDER — PROPOFOL 10 MG/ML IV BOLUS
INTRAVENOUS | Status: DC | PRN
  Administered 2023-09-10 (×2): 20 mg via INTRAVENOUS

## 2023-09-10 MED ORDER — CEFAZOLIN SODIUM 1 G IJ SOLR
INTRAMUSCULAR | Status: AC
  Filled 2023-09-10: qty 20

## 2023-09-10 MED ORDER — CHLORHEXIDINE GLUCONATE 0.12 % MT SOLN: 15.0000 mL | Freq: Once | OROMUCOSAL | Status: AC

## 2023-09-10 MED ORDER — FENTANYL CITRATE (PF) 100 MCG/2ML IJ SOLN
50.0000 ug | Freq: Once | INTRAMUSCULAR | Status: AC
Start: 2023-09-10 — End: 2023-09-10

## 2023-09-10 MED ORDER — CEFAZOLIN SODIUM-DEXTROSE 2-3 GM-%(50ML) IV SOLR
INTRAVENOUS | Status: DC | PRN
  Administered 2023-09-10: 2 g via INTRAVENOUS

## 2023-09-10 MED ORDER — ONDANSETRON HCL 4 MG/2ML IJ SOLN: 4.0000 mg | Freq: Once | INTRAMUSCULAR | Status: DC | PRN

## 2023-09-10 MED ORDER — INSULIN ASPART 100 UNIT/ML IJ SOLN: 0.0000 [IU] | INTRAMUSCULAR | Status: DC | PRN

## 2023-09-10 MED ORDER — CHLORHEXIDINE GLUCONATE 0.12 % MT SOLN
OROMUCOSAL | Status: AC
  Administered 2023-09-10: 15 mL via OROMUCOSAL
  Filled 2023-09-10: qty 15

## 2023-09-10 MED ORDER — LIDOCAINE-EPINEPHRINE 1 %-1:100000 IJ SOLN
INTRAMUSCULAR | Status: AC
  Filled 2023-09-10: qty 1

## 2023-09-10 MED ORDER — PROPOFOL 500 MG/50ML IV EMUL
INTRAVENOUS | Status: DC | PRN
Start: 2023-09-10 — End: 2023-09-10
  Administered 2023-09-10: 85 ug/kg/min via INTRAVENOUS

## 2023-09-10 MED ORDER — MEPIVACAINE HCL (PF) 2 % IJ SOLN
INTRAMUSCULAR | Status: DC | PRN
Start: 2023-09-10 — End: 2023-09-10
  Administered 2023-09-10: 20 mL

## 2023-09-10 MED ORDER — HEPARIN SODIUM (PORCINE) 1000 UNIT/ML IJ SOLN
INTRAMUSCULAR | Status: DC | PRN
  Administered 2023-09-10: 3000 [IU] via INTRAVENOUS

## 2023-09-10 MED ORDER — LIDOCAINE 2% (20 MG/ML) 5 ML SYRINGE
INTRAMUSCULAR | Status: AC
Start: 2023-09-10 — End: ?
  Filled 2023-09-10: qty 5

## 2023-09-10 MED ORDER — ACETAMINOPHEN 500 MG PO TABS
ORAL_TABLET | ORAL | Status: AC
  Filled 2023-09-10: qty 2

## 2023-09-10 MED ORDER — HEPARIN 6000 UNIT IRRIGATION SOLUTION
Status: AC
  Filled 2023-09-10: qty 500

## 2023-09-10 MED ORDER — LIDOCAINE 2% (20 MG/ML) 5 ML SYRINGE
INTRAMUSCULAR | Status: DC | PRN
  Administered 2023-09-10: 20 mg via INTRAVENOUS

## 2023-09-10 MED ORDER — MIDAZOLAM HCL 2 MG/2ML IJ SOLN: 1.0000 mg | Freq: Once | INTRAMUSCULAR | Status: AC

## 2023-09-10 MED ORDER — FENTANYL CITRATE (PF) 100 MCG/2ML IJ SOLN
INTRAMUSCULAR | Status: AC
  Administered 2023-09-10: 50 ug via INTRAVENOUS
  Filled 2023-09-10: qty 2

## 2023-09-10 MED ORDER — ACETAMINOPHEN 500 MG PO TABS
1000.0000 mg | ORAL_TABLET | Freq: Once | ORAL | Status: AC
  Administered 2023-09-10: 1000 mg via ORAL
  Filled 2023-09-10: qty 2

## 2023-09-10 MED ORDER — ORAL CARE MOUTH RINSE: 15.0000 mL | Freq: Once | OROMUCOSAL | Status: AC

## 2023-09-10 MED ORDER — 0.9 % SODIUM CHLORIDE (POUR BTL) OPTIME
TOPICAL | Status: DC | PRN
  Administered 2023-09-10: 1000 mL

## 2023-09-10 MED ORDER — HEPARIN 6000 UNIT IRRIGATION SOLUTION
Status: DC | PRN
  Administered 2023-09-10: 1

## 2023-09-10 MED ORDER — MIDAZOLAM HCL 2 MG/2ML IJ SOLN
INTRAMUSCULAR | Status: AC
  Administered 2023-09-10: 1 mg via INTRAVENOUS
  Filled 2023-09-10: qty 2

## 2023-09-10 MED ORDER — FENTANYL CITRATE (PF) 100 MCG/2ML IJ SOLN: 25.0000 ug | INTRAMUSCULAR | Status: DC | PRN

## 2023-09-10 SURGICAL SUPPLY — 28 items
ADH SKN CLS APL DERMABOND .7 (GAUZE/BANDAGES/DRESSINGS) ×1
ARMBAND PINK RESTRICT EXTREMIT (MISCELLANEOUS) ×1 IMPLANT
BAG COUNTER SPONGE SURGICOUNT (BAG) ×1 IMPLANT
BAG SPNG CNTER NS LX DISP (BAG) ×1
CANISTER SUCT 3000ML PPV (MISCELLANEOUS) ×1 IMPLANT
CLIP TI WIDE RED SMALL 6 (CLIP) ×1 IMPLANT
COVER PROBE W GEL 5X96 (DRAPES) IMPLANT
DERMABOND ADVANCED .7 DNX12 (GAUZE/BANDAGES/DRESSINGS) ×1 IMPLANT
ELECT REM PT RETURN 9FT ADLT (ELECTROSURGICAL) ×1
ELECTRODE REM PT RTRN 9FT ADLT (ELECTROSURGICAL) ×1 IMPLANT
GLOVE BIOGEL PI IND STRL 7.0 (GLOVE) ×1 IMPLANT
GOWN STRL REUS W/ TWL LRG LVL3 (GOWN DISPOSABLE) ×2 IMPLANT
GOWN STRL REUS W/ TWL XL LVL3 (GOWN DISPOSABLE) ×1 IMPLANT
GOWN STRL REUS W/TWL LRG LVL3 (GOWN DISPOSABLE) ×3
GOWN STRL REUS W/TWL XL LVL3 (GOWN DISPOSABLE) ×1
KIT BASIN OR (CUSTOM PROCEDURE TRAY) ×1 IMPLANT
KIT TURNOVER KIT B (KITS) ×1 IMPLANT
LOOP VESSEL MINI RED (MISCELLANEOUS) IMPLANT
NS IRRIG 1000ML POUR BTL (IV SOLUTION) ×1 IMPLANT
PACK CV ACCESS (CUSTOM PROCEDURE TRAY) ×1 IMPLANT
PAD ARMBOARD 7.5X6 YLW CONV (MISCELLANEOUS) ×2 IMPLANT
SUT MNCRL AB 4-0 PS2 18 (SUTURE) ×1 IMPLANT
SUT PROLENE 6 0 BV (SUTURE) ×1 IMPLANT
SUT VIC AB 3-0 SH 27 (SUTURE) ×1
SUT VIC AB 3-0 SH 27X BRD (SUTURE) ×1 IMPLANT
TOWEL GREEN STERILE (TOWEL DISPOSABLE) ×1 IMPLANT
UNDERPAD 30X36 HEAVY ABSORB (UNDERPADS AND DIAPERS) ×1 IMPLANT
WATER STERILE IRR 1000ML POUR (IV SOLUTION) ×1 IMPLANT

## 2023-09-10 NOTE — Progress Notes (Signed)
CCC Pre-op Review  Pre-op checklist:  at time of note ;incomplete  NPO:  yes  Labs: 11/7 am h/h 7.0 / 21.9 -->ORDERS FOR TRANSFUSION  11/6 GLU 131  BUN 92  Cr 5.27    Consent:  time of note: completion status unknown  H&P:  needs update  Vitals:  @0619   98.9  no HR recorded rr 18  142/73   98% RA  O2 requirements:    RA  MAR/PTA review:  * HEPARIN held this am  IV:   18g  L wrist  Floor nurse name:   Moynette fitzgerald, LPN  Additional info:   10/29  TLIF L2-3, L3-4, L-45                                   DM 2  COPD  HTN

## 2023-09-10 NOTE — Discharge Instructions (Signed)
[facility]  Discharge Instructions  AV Fistula or Graft Surgery for Dialysis Access  Please refer to the following instructions for your post-procedure care. Your surgeon or physician assistant will discuss any changes with you.  Activity  You may drive the day following your surgery, if you are comfortable and no longer taking prescription pain medication. Resume full activity as the soreness in your incision resolves.  Bathing/Showering  You may shower after you go home. Keep your incision dry for 48 hours. Do not soak in a bathtub, hot tub, or swim until the incision heals completely. You may not shower if you have a hemodialysis catheter.  Incision Care  Clean your incision with mild soap and water after 48 hours. Pat the area dry with a clean towel. You do not need a bandage unless otherwise instructed. Do not apply any ointments or creams to your incision. You may have skin glue on your incision. Do not peel it off. It will come off on its own in about one week. Your arm may swell a bit after surgery. To reduce swelling use pillows to elevate your arm so it is above your heart. Your doctor will tell you if you need to lightly wrap your arm with an ACE bandage.  Diet  Resume your normal diet. There are not special food restrictions following this procedure. In order to heal from your surgery, it is CRITICAL to get adequate nutrition. Your body requires vitamins, minerals, and protein. Vegetables are the best source of vitamins and minerals. Vegetables also provide the perfect balance of protein. Processed food has little nutritional value, so try to avoid this.  Medications  Resume taking all of your medications. If your incision is causing pain, you may take over-the counter pain relievers such as acetaminophen (Tylenol). If you were prescribed a stronger pain medication, please be aware these medications can cause nausea and constipation. Prevent  nausea by taking the medication with a snack or meal. Avoid constipation by drinking plenty of fluids and eating foods with high amount of fiber, such as fruits, vegetables, and grains.  Do not take Tylenol if you are taking prescription pain medications.  Follow up Your surgeon may want to see you in the office following your access surgery. If so, this will be arranged at the time of your surgery.  Please call us immediately for any of the following conditions:  Increased pain, redness, drainage (pus) from your incision site Fever of 101 degrees or higher Severe or worsening pain at your incision site Hand pain or numbness.  Reduce your risk of vascular disease:  Stop smoking. If you would like help, call [facility] at 1-800-QUIT-NOW (438-689-8273) or [place] at [phone]  Manage your cholesterol Maintain a desired weight Control your diabetes Keep your blood pressure down  Dialysis  It will take several weeks to several months for your new dialysis access to be ready for use. Your surgeon will determine when it is okay to use it. Your nephrologist will continue to direct your dialysis. You can continue to use your Permcath until your new access is ready for use.   09/10/2023 Curtis Hartman [id number] 1961/09/21  Surgeon(s): Daria Pastures, MD  Procedure(s): LEFT ARM ARTERIOVENOUS (AV) FISTULA VERSUS ARTERIOVENOUS GRAFT CREATION   May stick graft immediately   May stick graft on designated area only:   X Do not stick left AV fistula for 12 weeks    If you have any questions, please  call the office at [phone].

## 2023-09-10 NOTE — Progress Notes (Addendum)
PT Cancellation Note  Patient Details Name: Curtis Hartman MRN: [id number] DOB: 01/25/1961   Cancelled Treatment:    Reason Eval/Treat Not Completed: 11:55 AM (P) Patient at procedure or test/unavailable (Pt at OR for L arm AVF vs graft placement today with Dr. Hetty Blend.)  Addendum 1729: Reattempt, pt had just received dinner and positioned in supine with HOB more elevated to eat safely. Pt defers OOB due to wanting to eat while food is hot. Will continue efforts next date per PT plan of care as schedule permits.   Carmina Walle M Hendel Gatliff 09/10/2023, 11:55 AM

## 2023-09-10 NOTE — PMR Pre-admission (Signed)
PMR Admission Coordinator Pre-Admission Assessment  Patient: Curtis Hartman is an 62 y.o., male MRN: [id number] DOB: 02-05-1961 Height: 5\' 9"  (175.3 cm) Weight: 71.2 kg              Insurance Information HMO:     PPO:      PCP:      IPA:      80/20:      OTHER:  PRIMARYRolene Arbour Medicaid      Policy#: [id number]      Subscriber: pt CM Name: faxed approval from [employer]    Phone#: [phone] Fax#: [phone] Pre-Cert#: [id number] auth for CIR from [facility] with [facility] Medicaid with updates due to fax above on 09/16/23      Employer:  Benefits:  Phone #: [phone]     Name:  Eff. Date: 01/02/23     Deduct: $0      Out of Pocket Max: $0      Life Max: n/a  CIR: 100%      SNF: 100% Outpatient: 100%     Co-Pay:  Home Health: 100%      Co-Pay:  DME: 100%     Co-Pay:  Providers:  SECONDARY:       Policy#:       Phone#:   Artist:       Phone#:   The "Data Collection Information Summary" for patients in Inpatient Rehabilitation Facilities with attached "Privacy Act Statement-Health Care Records" was provided and verbally reviewed with: Patient and Family  Emergency Contact Information Contact Information     Name Relation Home Work Mobile   [place] Sister   [phone]      Other Contacts   None on File    Current Medical History  Patient Admitting Diagnosis: lumbar myelopathy  History of Present Illness: Pt is a 62 y/o male with PMH of arthritis, CKD, COPD, DM, hepatitis, HTN, prior PCDF, and neuropathy admitted to [facility] on 10/29 for planned intervention of severe lumbar stenosis with myelopathy.  He underwent a direct lumbar interbody fusion of L2-5 on 10/29 per Dr. Maisie Fus.  Post op course complicated by hypertensive emergency, AKI on stage IV CKD, urinary retention, uncontrolled DM, and pain control.  Therapy evaluations completed and pt was recommended for CIR.       Patient's medical record from [facility] has been reviewed by the rehabilitation admission  coordinator and physician.  Past Medical History  Past Medical History:  Diagnosis Date   Arthritis    CKD (chronic kidney disease)    COPD (chronic obstructive pulmonary disease) (HCC)    Diabetes mellitus without complication (HCC)    type 2   GERD (gastroesophageal reflux disease)    Headache    Hx Migraines   Hepatitis    Hypertension    Neuropathy     Has the patient had major surgery during 100 days prior to admission? Yes  Family History  family history includes Heart disease in his mother; Liver disease in his paternal grandmother; Seizures in his father; Stroke in his maternal grandfather.   Current Medications   Current Facility-Administered Medications:    acetaminophen (TYLENOL) tablet 650 mg, 650 mg, Oral, Q4H PRN, 650 mg at 09/08/23 2055 **OR** acetaminophen (TYLENOL) suppository 650 mg, 650 mg, Rectal, Q4H PRN, Patrici Ranks [place], PA-C   albuterol (PROVENTIL) (2.5 MG/3ML) 0.083% nebulizer solution 3 mL, 3 mL, Inhalation, Q6H PRN, Patrici Ranks [place], PA-C   amLODipine (NORVASC) tablet 10 mg, 10 mg, Oral, Daily PRN, Esperanza Richters,  Elvera Maria, PA-C, 10 mg at 09/01/23 2101   amLODipine (NORVASC) tablet 10 mg, 10 mg, Oral, Daily, Elgergawy, Leana Roe, MD, 10 mg at 09/11/23 4854   calcitRIOL (ROCALTROL) capsule 0.25 mcg, 0.25 mcg, Oral, Once per day on Monday Wednesday Friday, Clovis Riley, PA-C, 0.25 mcg at 09/11/23 6270   calcium acetate (PHOSLO) capsule 667 mg, 667 mg, Oral, BID WC, Patrici Ranks [place], [place], 667 mg at 09/11/23 0902   carvedilol (COREG) tablet 3.125 mg, 3.125 mg, Oral, BID WC, Elgergawy, Leana Roe, MD, 3.125 mg at 09/11/23 3500   Chlorhexidine Gluconate Cloth 2 % PADS 6 each, 6 each, Topical, Daily, Bedelia Person, MD, 6 each at 09/10/23 1027   Darbepoetin Alfa (ARANESP) injection 60 mcg, 60 mcg, Subcutaneous, Q Wed-1800, Terrial Rhodes, MD, 60 mcg at 09/09/23 2215   docusate sodium (COLACE) capsule 100 mg, 100 mg, Oral, BID,  Patrici Ranks [place], PA-C, 100 mg at 09/05/23 0843   fluticasone furoate-vilanterol (BREO ELLIPTA) 100-25 MCG/ACT 1 puff, 1 puff, Inhalation, Daily, 1 puff at 09/11/23 0752 **AND** umeclidinium bromide (INCRUSE ELLIPTA) 62.5 MCG/ACT 1 puff, 1 puff, Inhalation, Daily, Bedelia Person, MD, 1 puff at 09/11/23 0752   gabapentin (NEURONTIN) capsule 400 mg, 400 mg, Oral, BID, Patrici Ranks [place], PA-C, 400 mg at 09/11/23 9381   heparin injection 5,000 Units, 5,000 Units, Subcutaneous, Q8H, Patrici Ranks [employer], PA-C, 5,000 Units at 09/11/23 0546   hydrALAZINE (APRESOLINE) injection 5 mg, 5 mg, Intravenous, Q4H PRN, Elgergawy, Leana Roe, MD   HYDROmorphone (DILAUDID) injection 0.5 mg, 0.5 mg, Intravenous, Q3H PRN, Clovis Riley, PA-C, 0.5 mg at 09/10/23 8299   insulin aspart (novoLOG) injection 0-15 Units, 0-15 Units, Subcutaneous, TID WC, Patrici Ranks [place], PA-C, 2 Units at 09/11/23 0825   insulin aspart (novoLOG) injection 0-5 Units, 0-5 Units, Subcutaneous, QHS, [place], Sara Caylin, PA-C   labetalol (NORMODYNE) injection 10 mg, 10 mg, Intravenous, Q2H PRN, Lisbeth Renshaw, MD, 10 mg at 09/02/23 0102   loperamide (IMODIUM) capsule 2 mg, 2 mg, Oral, PRN, [place], Eric J, DO, 2 mg at 09/10/23 2110   menthol-cetylpyridinium (CEPACOL) lozenge 3 mg, 1 lozenge, Oral, PRN **OR** phenol (CHLORASEPTIC) mouth spray 1 spray, 1 spray, Mouth/Throat, PRN, Patrici Ranks [place], PA-C   methocarbamol (ROBAXIN) tablet 500 mg, 500 mg, Oral, Q6H PRN, 500 mg at 09/11/23 0440 **OR** methocarbamol (ROBAXIN) injection 500 mg, 500 mg, Intravenous, Q6H PRN, Patrici Ranks [place], PA-C   ondansetron [facility]) tablet 4 mg, 4 mg, Oral, Q6H PRN, 4 mg at 09/02/23 0841 **OR** ondansetron (ZOFRAN) injection 4 mg, 4 mg, Intravenous, Q6H PRN, Patrici Ranks [place], PA-C   oxyCODONE (Oxy IR/ROXICODONE) immediate release tablet 10 mg, 10 mg, Oral, Q4H PRN, Patrici Ranks [place], PA-C, 10 mg at 09/11/23 3716    oxyCODONE (Oxy IR/ROXICODONE) immediate release tablet 5 mg, 5 mg, Oral, Q4H PRN, Patrici Ranks [place], PA-C   polyethylene glycol (MIRALAX / GLYCOLAX) packet 17 g, 17 g, Oral, Daily PRN, Patrici Ranks [place], PA-C   sodium bicarbonate tablet 650 mg, 650 mg, Oral, TID, Elgergawy, Leana Roe, MD, 650 mg at 09/11/23 0903   sodium chloride flush (NS) 0.9 % injection 3 mL, 3 mL, Intravenous, Q12H, Patrici Ranks [place], PA-C, 3 mL at 09/10/23 2111   sodium chloride flush (NS) 0.9 % injection 3 mL, 3 mL, Intravenous, PRN, Patrici Ranks [place], PA-C   tamsulosin [facility]) capsule 0.4 mg, 0.4 mg, Oral, Daily, Elgergawy, Leana Roe, MD, 0.4 mg at 09/11/23 0905  Patients Current Diet:  Diet  Order             Diet Carb Modified Fluid consistency: Thin; Room service appropriate? Yes  Diet effective now                   Precautions / Restrictions Precautions Precautions: Back, Other (comment) Precaution Booklet Issued: Yes (comment) Precaution Comments: foley Spinal Brace: Lumbar corset, Applied in sitting position Restrictions Weight Bearing Restrictions: No Other Position/Activity Restrictions: Pt required assist to don brace.   Has the patient had 2 or more falls or a fall with injury in the past year?No  Prior Activity Level Limited Community (1-2x/wk): rollator in community, furniture surfs for household mobility, no assist for ADLs at baseline  Prior Functional Level Prior Function Prior Level of Function : Independent/Modified Independent Mobility Comments: uses rollator outside, and generally no AD in the home as he furniture walks ADLs Comments: pt managing ADLs and light IADls  Self Care: Did the patient need help bathing, dressing, using the toilet or eating?  Independent  Indoor Mobility: Did the patient need assistance with walking from room to room (with or without device)? Independent  Stairs: Did the patient need assistance with internal or external stairs (with  or without device)? Independent  Functional Cognition: Did the patient need help planning regular tasks such as shopping or remembering to take medications? Independent  Patient Information Are you of Hispanic, Latino/a,or Spanish origin?: A. No, not of Hispanic, Latino/a, or Spanish origin What is your race?: A. White Do you need or want an interpreter to communicate with a doctor or health care staff?: 0. No  Patient's Response To:  Health Literacy and Transportation Is the patient able to respond to health literacy and transportation needs?: Yes Health Literacy - How often do you need to have someone help you when you read instructions, pamphlets, or other written material from your doctor or pharmacy?: Never In the past 12 months, has lack of transportation kept you from medical appointments or from getting medications?: Yes In the past 12 months, has lack of transportation kept you from meetings, work, or from getting things needed for daily living?: Yes  Home Assistive Devices / Equipment Home Equipment: Rollator (4 wheels), BSC/3in1, Adaptive equipment, Shower seat, Tub bench  Prior Device Use: Indicate devices/aids used by the patient prior to current illness, exacerbation or injury? Walker  Current Functional Level Cognition  Overall Cognitive Status: Impaired/Different from baseline Orientation Level: Oriented X4 Following Commands: Follows one step commands inconsistently Safety/Judgement: Decreased awareness of safety, Decreased awareness of deficits General Comments: pt needing increased cues for technique and safety. poor problem solving and understanding of PT education.    Extremity Assessment (includes Sensation/Coordination)  Upper Extremity Assessment: Right hand dominant, Generalized weakness, LUE deficits/detail LUE Deficits / Details: limited shoulder flexion to 15-20 degress due to arthritis  Lower Extremity Assessment: Defer to PT evaluation    ADLs   Overall ADL's : Needs assistance/impaired Grooming: Set up, Sitting Upper Body Dressing : Minimal assistance, Sitting Upper Body Dressing Details (indicate cue type and reason): brace Lower Body Dressing: Minimal assistance, Sit to/from stand, With adaptive equipment Lower Body Dressing Details (indicate cue type and reason): used AE, min assist to stand and support to managing clothing over hips Toilet Transfer: Minimal assistance, BSC/3in1, Rolling walker (2 wheels) Toilet Transfer Details (indicate cue type and reason): from EOB min A, min guard from recliner with B arm rests Toileting- Clothing Manipulation and Hygiene: Moderate assistance, Sit to/from stand Toileting -  Clothing Manipulation Details (indicate cue type and reason): hygiene, clothing mgmt Functional mobility during ADLs: Contact guard assist, Rolling walker (2 wheels), Cueing for safety    Mobility  Overal bed mobility: Needs Assistance Bed Mobility: Sidelying to Sit, Rolling Rolling: Supervision Sidelying to sit: Supervision Sit to sidelying: Mod assist General bed mobility comments: OOB on BSC upon entry    Transfers  Overall transfer level: Needs assistance Equipment used: Rolling walker (2 wheels) Transfers: Sit to/from Stand Sit to Stand: Min assist Bed to/from chair/wheelchair/BSC transfer type:: Step pivot Step pivot transfers: Min assist General transfer comment: Cues for safe BUE placement and posture; STS x 2 from [facility], slow to initiate, pt with loose stools upon standing so had to sit back down to continue BM    Ambulation / Gait / Stairs / Wheelchair Mobility  Ambulation/Gait Ambulation/Gait assistance: Editor, commissioning (Feet): 55 Feet (+ 25 ft) Assistive device: Rolling walker (2 wheels) Gait Pattern/deviations: Step-through pattern, Decreased stride length, Trunk flexed, Decreased dorsiflexion - right, Decreased dorsiflexion - left, Knee flexed in stance - right, Knee flexed in stance -  left General Gait Details: Defer, pt with diarrhea each time standing and not safe to progress gait at time of session. Gait velocity: decreased Gait velocity interpretation: <1.31 ft/sec, indicative of household ambulator    Posture / Balance Dynamic Sitting Balance Sitting balance - Comments: sitting forward in chair; pt tends to lean back due to back discomfort Balance Overall balance assessment: Needs assistance Sitting-balance support: No upper extremity supported, Feet supported Sitting balance-Leahy Scale: Fair Sitting balance - Comments: sitting forward in chair; pt tends to lean back due to back discomfort Standing balance support: Bilateral upper extremity supported, During functional activity Standing balance-Leahy Scale: Poor Standing balance comment: BUE support on RW    Special needs/care consideration Skin surgical incision to lumbar spine and Diabetic management yes     Previous Home Environment (from acute therapy documentation) Living Arrangements: Other relatives (sister and her boyfriend) Available Help at Discharge: Family, Available 24 hours/day Type of Home: House Home Layout: One level Home Access: Stairs to enter [employer] of Steps: 2 Bathroom Shower/Tub: Engineer, manufacturing systems: Standard Home Care Services: No  Discharge Living Setting Plans for Discharge Living Setting: Patient's home, Lives with (comment) (sister and BIL) Type of Home at Discharge: House Discharge Home Layout: One level Discharge Home Access: Stairs to enter Entrance Stairs-Rails: None Entrance Stairs-Number of Steps: 2 Discharge Bathroom Shower/Tub: Tub/shower unit Discharge Bathroom Toilet: Standard Discharge Bathroom Accessibility: Yes How Accessible: Accessible via walker Does the patient have any problems obtaining your medications?: No  Social/Family/Support Systems Anticipated Caregiver: sister, Dois Davenport Anticipated Industrial/product designer Information:  [phone] Ability/Limitations of Caregiver: none stated Caregiver Availability: 24/7 Discharge Plan Discussed with Primary Caregiver: Yes Is Caregiver In Agreement with Plan?: Yes Does Caregiver/Family have Issues with Lodging/Transportation while Pt is in Rehab?: No   Goals Patient/Family Goal for Rehab: PT/OT supervision to mod I, SLP n/a Expected length of stay: 10-14 days Additional Information: Discharge plan: return home with sister and BIL who are able to provide 24/7 support Pt/Family Agrees to Admission and willing to participate: Yes Program Orientation Provided & Reviewed with Pt/Caregiver Including Roles  & Responsibilities: Yes  Barriers to Discharge: Insurance for SNF coverage   Decrease burden of Care through IP rehab admission: n/a   Possible need for SNF placement upon discharge: Not anticipated.  Discharge plan: return to pt's primary residence, where his sister and BIL also live and  can provide 24/7 support.    Patient Condition: This patient's medical and functional status has changed since the consult dated: 09/08/23 in which the Rehabilitation Physician determined and documented that the patient's condition is appropriate for intensive rehabilitative care in an inpatient rehabilitation facility. See "History of Present Illness" (above) for medical update. Functional changes are: pt is min assist for mobility. Patient's medical and functional status update has been discussed with the Rehabilitation physician and patient remains appropriate for inpatient rehabilitation. Will admit to inpatient rehab today.  Preadmission Screen Completed By:  Stephania Fragmin, PT, DPT 09/11/2023 10:03 AM ______________________________________________________________________   Discussed status with Dr. Shearon Stalls on 09/11/23 at 10:03 AM  and received approval for admission today.  Admission Coordinator:  Stephania Fragmin, PT, DPT time 10:03 AM Dorna Bloom 09/11/23

## 2023-09-10 NOTE — Progress Notes (Signed)
PROGRESS NOTE    Curtis Hartman  WUJ:[id number] DOB: [birth year] DOA: 09/01/2023 PCP: Wilmon Pali, FNP    Brief Narrative:   Curtis Hartman is a 62 y.o. male with past medical history significant for COPD, HTN, type 2 diabetes mellitus, CKD stage IV, lumbar spinal stenosis with radiculopathy admitted on 10/29 by neurosurgery who underwent L2-5 DLIF, percutaneous instrumentation by Dr. Maisie Fus on 09/01/2023.  Hospital course complicated by acute renal failure on CKD stage IV; hospitalist team consulted on 10/31 for assistance with medical management.  Nephrology consulted on 10/31.  Assessment & Plan:   Acute renal failure on CKD stage IV Etiology likely multifactorial with contrast nephropathy, ischemic ATN, ABLA with con commitment ARB therapy, acute urinary retention.  Renal ultrasound with no hydronephrosis or nephrolithiasis, increased renal parenchymal echogenicity seen with medical renal disease. -- Nephrology following, appreciate assistance -- ARB discontinued -- Cr 4.84>>5.19>5.03>5.11>5.27>4.80 -- Strict I's and O's, 2.0L out past 24h -- Avoid nephrotoxins, renally dose all medications -- continue Foley catheter -- BMP daily -- Vascular surgery consulted for vascular access, LUE AVF planned today with Dr. 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  -- Hgb  9.2>>7.6>7.0>7.8 -- transfuse 1u pRBC today -- CBC daily  Hypertensive emergency, resolved -- Amlodipine 10 mg p.o. daily -- Carvedilol 0.125 mg p.o. twice daily  Lumbar spinal stenosis with radiculopathy Initially admitted on 09/01/2023 by neurosurgery and underwent L2-5 TLIF with percutaneous administration by Dr. Maisie Fus. -- Further per neurosurgery  COPD -- Breo Ellipta 1 puff daily -- Incruse Ellipta 1 puff daily  Type 2 diabetes mellitus Home regimen includes Lantus 5 units daily nightly as needed for blood sugar over 150.  A1c 5.7 on 08/26/2023,  well-controlled.  BPH -- Tamsulosin 0.4 mg p.o. daily   DVT prophylaxis: heparin injection 5,000 Units Start: 09/04/23 1715 SCD's Start: 09/01/23 1935    Code Status: Full Code Family Communication: No family present at bedside  Disposition Plan:  Level of care: Med-Surg Status is: Inpatient Remains inpatient appropriate because: Per primary, pending CIR     Subjective: Patient seen examined bedside, lying in bed.  Continues to complain of generalized weakness, back pain. Plan LUE AVF placement today.  Awaiting discharge to CIR.  Denies headache, no visual changes, no chest pain, no shortness of breath, no abdominal pain, no fever/chills/night sweats, no nausea/vomiting/diarrhea, no focal weakness, no fatigue, no paresthesias.  No acute events overnight per nursing.  Objective: Vitals:   09/10/23 1247 09/10/23 1300 09/10/23 1315 09/10/23 1330  BP: (!) 143/65 (!) 140/68 (!) 140/68 (!) 141/66  Pulse: 71 71 71 71  Resp: 12 14 14 14   Temp: 98 F (36.7 C)   98 F (36.7 C)  TempSrc:      SpO2: 97% 99% 95% 95%  Weight:      Height:        Intake/Output Summary (Last 24 hours) at 09/10/2023 1341 Last data filed at 09/10/2023 1250 Gross per 24 hour  Intake 650 ml  Output 2810 ml  Net -2160 ml   Filed Weights   09/01/23 0819 09/10/23 1110  Weight: 71.2 kg 71.2 kg    Examination:  Physical Exam: GEN: NAD, alert and oriented x 3, chronically ill appearance HEENT: NCAT, PERRL, EOMI, sclera clear, MMM PULM: CTAB w/o wheezes/crackles, normal respiratory effort on room air CV: RRR w/o M/G/R GI: abd soft, NTND, NABS, no R/G/M MSK: no peripheral edema, moves all extremities independently NEURO: No focal neurological  deficits PSYCH: normal mood/affect Integumentary: No concerning rashes/lesions/wounds noted on exposed skin surfaces    Data Reviewed: I have personally reviewed following labs and imaging studies  CBC: Recent Labs  Lab 09/04/23 0737 09/09/23 2038  09/10/23 0749 09/10/23 1057  WBC 11.6* 10.9* 8.9  --   HGB 8.0* 7.6* 7.0* 7.8*  HCT 24.6* 23.6* 21.9* 23.0*  MCV 89.8 90.8 90.9  --   PLT 243 327 307  --    Basic Metabolic Panel: Recent Labs  Lab 09/06/23 0317 09/07/23 0751 09/08/23 0634 09/09/23 0554 09/10/23 0749 09/10/23 1057  NA 140 139 136 137 139 139  K 3.8 4.0 4.6 4.9 4.7 4.8  CL 110 110 107 107 105 104  CO2 20* 19* 21* 22 24  --   GLUCOSE 128* 121* 115* 131* 105* 99  BUN 82* 81* 83* 92* 89* 92*  CREATININE 5.19* 5.03* 5.11* 5.27* 4.82* 4.80*  CALCIUM 8.0* 8.0* 8.1* 8.0* 8.3*  --   PHOS  --   --   --   --  5.4*  --    GFR: Estimated Creatinine Clearance: 15.8 mL/min (A) (by C-G formula based on SCr of 4.8 mg/dL (H)). Liver Function Tests: Recent Labs  Lab 09/10/23 0749  ALBUMIN 2.2*   No results for input(s): "LIPASE", "AMYLASE" in the last 168 hours. No results for input(s): "AMMONIA" in the last 168 hours. Coagulation Profile: No results for input(s): "INR", "PROTIME" in the last 168 hours. Cardiac Enzymes: No results for input(s): "CKTOTAL", "CKMB", "CKMBINDEX", "TROPONINI" in the last 168 hours. BNP (last 3 results) No results for input(s): "PROBNP" in the last 8760 hours. HbA1C: No results for input(s): "HGBA1C" in the last 72 hours. CBG: Recent Labs  Lab 09/09/23 1117 09/09/23 1620 09/09/23 2023 09/10/23 0730 09/10/23 1251  GLUCAP 115* 180* 97 110* 79   Lipid Profile: No results for input(s): "CHOL", "HDL", "LDLCALC", "TRIG", "CHOLHDL", "LDLDIRECT" in the last 72 hours. Thyroid Function Tests: No results for input(s): "TSH", "T4TOTAL", "FREET4", "T3FREE", "THYROIDAB" in the last 72 hours. Anemia Panel: Recent Labs    09/10/23 0749  TIBC 195*  IRON 45   Sepsis Labs: No results for input(s): "PROCALCITON", "LATICACIDVEN" in the last 168 hours.  No results found for this or any previous visit (from the past 240 hour(s)).       Radiology Studies: VAS [place] UPPER EXT VEIN MAPPING  (PRE-OP AVF)  Result Date: 09/09/2023 UPPER EXTREMITY VEIN MAPPING Patient Name:  Curtis RITCHEY  Date of Exam:   09/08/2023 Medical Rec #: [id number]         Accession #:    [id number] Date of Birth: 08-Apr-1961         Patient Gender: M Patient Age:   28 years Exam Location:  [facility] Procedure:      VAS [place] UPPER EXT VEIN MAPPING (PRE-OP AVF) Referring Phys: Jomarie Longs [place] --------------------------------------------------------------------------------  Indications: Pre-access. Performing Technologist: Fernande Bras Supporting Technologist: Marilynne Halsted RDMS, RVT  Examination Guidelines: A complete evaluation includes B-mode imaging, spectral Doppler, color Doppler, and power Doppler as needed of all accessible portions of each vessel. Bilateral testing is considered an integral part of a complete examination. Limited examinations for reoccurring indications may be performed as noted. +-----------------+-------------+----------+---------+ Right Cephalic   Diameter (cm)Depth (cm)Findings  +-----------------+-------------+----------+---------+ Shoulder             0.37        0.57             +-----------------+-------------+----------+---------+  Prox upper arm       0.33        0.53             +-----------------+-------------+----------+---------+ Mid upper arm        0.32        0.30             +-----------------+-------------+----------+---------+ Dist upper arm       0.27        0.24             +-----------------+-------------+----------+---------+ Antecubital fossa    0.37        0.24   branching +-----------------+-------------+----------+---------+ Prox forearm         0.27        0.21             +-----------------+-------------+----------+---------+ Mid forearm          0.26        0.20             +-----------------+-------------+----------+---------+ Dist forearm         0.20        0.16              +-----------------+-------------+----------+---------+ Wrist                0.23        0.17             +-----------------+-------------+----------+---------+ +-----------------+-------------+----------+---------+ Right Basilic    Diameter (cm)Depth (cm)Findings  +-----------------+-------------+----------+---------+ Shoulder             0.57                         +-----------------+-------------+----------+---------+ Prox upper arm       0.47               branching +-----------------+-------------+----------+---------+ Mid upper arm        0.42                         +-----------------+-------------+----------+---------+ Dist upper arm       0.40                         +-----------------+-------------+----------+---------+ Antecubital fossa    0.45               branching +-----------------+-------------+----------+---------+ Prox forearm         0.15                         +-----------------+-------------+----------+---------+ Mid forearm          0.18                         +-----------------+-------------+----------+---------+ Distal forearm       0.19                         +-----------------+-------------+----------+---------+ Wrist                0.14                         +-----------------+-------------+----------+---------+ +-----------------+-------------+----------+-------------------+ Left Cephalic    Diameter (cm)Depth (cm)     Findings       +-----------------+-------------+----------+-------------------+ Shoulder  0.45        1.00                       +-----------------+-------------+----------+-------------------+ Prox upper arm       0.39        0.67                       +-----------------+-------------+----------+-------------------+ Mid upper arm        0.30        0.41                       +-----------------+-------------+----------+-------------------+ Dist upper arm       0.26        0.22                        +-----------------+-------------+----------+-------------------+ Antecubital fossa    0.42        0.22                       +-----------------+-------------+----------+-------------------+ Prox forearm         0.37        0.34                       +-----------------+-------------+----------+-------------------+ Mid forearm          0.30        0.34                       +-----------------+-------------+----------+-------------------+ Dist forearm         0.30        0.23   Thrombus visualized +-----------------+-------------+----------+-------------------+ Wrist                0.30        0.24                       +-----------------+-------------+----------+-------------------+ +-----------------+-------------+----------+--------+ Left Basilic     Diameter (cm)Depth (cm)Findings +-----------------+-------------+----------+--------+ Shoulder             0.58                        +-----------------+-------------+----------+--------+ Prox upper arm       0.39                        +-----------------+-------------+----------+--------+ Mid upper arm        0.26                        +-----------------+-------------+----------+--------+ Dist upper arm       0.36                        +-----------------+-------------+----------+--------+ Antecubital fossa    0.14                        +-----------------+-------------+----------+--------+ Prox forearm         0.13                        +-----------------+-------------+----------+--------+ Mid forearm          0.14                        +-----------------+-------------+----------+--------+  Distal forearm       0.14                        +-----------------+-------------+----------+--------+ Wrist                0.19                        +-----------------+-------------+----------+--------+ *See table(s) above for measurements and observations.  Diagnosing physician:  Coral Else MD Electronically signed by Coral Else MD on 09/09/2023 at 9:25:34 PM.    Final         Scheduled Meds:  [MAR Hold] sodium chloride   Intravenous Once   acetaminophen       [MAR Hold] amLODipine  10 mg Oral Daily   [MAR Hold] calcitRIOL  0.25 mcg Oral Once per day on Monday Wednesday Friday   [facility] Hold] calcium acetate  667 mg Oral BID WC   [MAR Hold] carvedilol  3.125 mg Oral BID WC   [MAR Hold] Chlorhexidine Gluconate Cloth  6 each Topical Daily   [MAR Hold] darbepoetin (ARANESP) injection - NON-DIALYSIS  60 mcg Subcutaneous Q Wed-1800   [MAR Hold] docusate sodium  100 mg Oral BID   [MAR Hold] fluticasone furoate-vilanterol  1 puff Inhalation Daily   And   [MAR Hold] umeclidinium bromide  1 puff Inhalation Daily   [MAR Hold] gabapentin  400 mg Oral BID   [MAR Hold] heparin injection (subcutaneous)  5,000 Units Subcutaneous Q8H   [MAR Hold] insulin aspart  0-15 Units Subcutaneous TID WC   [MAR Hold] insulin aspart  0-5 Units Subcutaneous QHS   [MAR Hold] sodium bicarbonate  650 mg Oral TID   [MAR Hold] sodium chloride flush  3 mL Intravenous Q12H   [MAR Hold] tamsulosin  0.4 mg Oral Daily   Continuous Infusions:     LOS: 9 days    Time spent: 51 minutes spent on chart review, discussion with nursing staff, consultants, updating family and interview/physical exam; more than 50% of that time was spent in counseling and/or coordination of care.    Alvira Philips [place], DO [facility] Available via Epic secure chat 7am-7pm After these hours, please refer to coverage provider listed on [email or web address] 09/10/2023, 1:41 PM

## 2023-09-10 NOTE — Transfer of Care (Signed)
Immediate Anesthesia Transfer of Care Note  Patient: Curtis Hartman  Procedure(s) Performed: LEFT ARM ARTERIOVENOUS (AV) FISTULA CREATION (Left: Arm Upper)  Patient Location: PACU  Anesthesia Type:MAC and Regional  Level of Consciousness: awake, patient cooperative, and responds to stimulation  Airway & Oxygen Therapy: Patient Spontanous Breathing  Post-op Assessment: Report given to RN and Post -op Vital signs reviewed and stable  Post vital signs: Reviewed and stable  Last Vitals:  Vitals Value Taken Time  BP    Temp    Pulse    Resp    SpO2      Last Pain:  Vitals:   09/10/23 1051  TempSrc: Oral  PainSc: 10-Worst pain ever      Patients Stated Pain Goal: 0 (09/10/23 1610)  Complications: No notable events documented.

## 2023-09-10 NOTE — Op Note (Signed)
    NAME: Curtis Hartman    MRN: [id number] DOB: Aug 29, 1961    DATE OF OPERATION: 09/10/2023  PREOP DIAGNOSIS:    CKD stage 4  POSTOP DIAGNOSIS:    Same  PROCEDURE:    L brachiocephalic AVF  SURGEON: Daria Pastures  ASSIST: Graceann Congress, PA  ANESTHESIA: Regional block, MAC   EBL: minimal  INDICATIONS:    Curtis Hartman is a 62 y.o. male history of CKD 4. His creatinine has been fluctuating/worsening since admission. Nephrology has anticipated the patient is nearing dialysis. Risks and benefits of permanent HD access were reviewed and he was willing to proceed.   FINDINGS:   Palpable thrill in [facility] AVF  TECHNIQUE:   The patient was brought to the operating room and placed in supine position. The Left arm was prepped and draped in a standard fashion.  Preoperative antibiotics were administered and a timeout was performed.    The case began with ultrasound mapping of the brachial artery and cephalic vein, which demonstrated sufficient size at the antecubital fossa for arteriovenous fistula.  A transverse incision was made 1 finger breadth below the elbow creese in the antecubital fossa. The cephalic vein was isolated for 3 cm in length. Next the aponeurosis was partially released and the brachial artery secured with a vessel loop. The patient was heparinized. The cephalic vein was transected and ligated distally with a 2-0 silk and vascular clip. The vein was dilated and flushed with heparin saline. Vascular clamps were placed proximally and distally on the brachial artery and an approximate 6 mm arteriotomy was created on the brachial artery. This was flushed with heparin saline.  An anastomosis was created in end to side fashion on the brachial artery using running 6-0 Prolene suture. Prior to completing the anastomosis, the vessels were flushed and the suture line was tied down. There was an excellent thrill in the cephalic vein from the anastomosis to the mid upper arm. The  patient had a multiphasic radial and ulnar signal. He had an excellent doppler signal in the fistula. The incision was irrigated and hemostasis achieved with cautery and suture. The deeper tissue was closed with 3-0 Vicryl and the skin closed with 4-0 Monocryl. Dermabond was applied to the incisions. The patient was transferred to PACU in stable condition.   Given the complexity of the case,  the assistant was necessary in order to expedient the procedure and safely perform the technical aspects of the operation.  The assistant provided traction and countertraction to assist with exposure of the artery and vein.  They also assisted with suture ligation of multiple venous branches.  They played a critical role in the anastomosis. These skills, especially following the Prolene suture for the anastomosis, could not have been adequately performed by a scrub tech assistant.   Daria Pastures, MD [facility] DATE OF DICTATION:   09/10/2023

## 2023-09-10 NOTE — Progress Notes (Signed)
Patient ID: Curtis Hartman, male   DOB: October 02, 1961, 62 y.o.   MRN: [id number] S: No new complaints or events overnight. O:BP (!) 147/78 (BP Location: Right Arm)   Pulse 75   Temp 98.5 F (36.9 C) (Oral)   Resp 10   Ht 5\' 9"  (1.753 m)   Wt 71.2 kg   SpO2 98%   BMI 23.18 kg/m   Intake/Output Summary (Last 24 hours) at 09/10/2023 1203 Last data filed at 09/10/2023 1143 Gross per 24 hour  Intake 350 ml  Output 2800 ml  Net -2450 ml   Intake/Output: I/O last 3 completed shifts: In: 240 [P.O.:240] Out: 3900 [Urine:3900]  Intake/Output this shift:  Total I/O In: 110 [I.V.:100; IV Piggyback:10] Out: 500 [Urine:500] Weight change:  Gen:NAD CVS: RRR Resp:CTA Abd: +BS, soft, TN/ND Ext: no edema  Recent Labs  Lab 09/04/23 0737 09/05/23 0322 09/06/23 0317 09/07/23 0751 09/08/23 0634 09/09/23 0554 09/10/23 0749 09/10/23 1057  NA 139 137 140 139 136 137 139 139  K 3.4* 3.7 3.8 4.0 4.6 4.9 4.7 4.8  CL 110 104 110 110 107 107 105 104  CO2 20* 19* 20* 19* 21* 22 24  --   GLUCOSE 143* 127* 128* 121* 115* 131* 105* 99  BUN 71* 76* 82* 81* 83* 92* 89* 92*  CREATININE 4.92* 4.85* 5.19* 5.03* 5.11* 5.27* 4.82* 4.80*  ALBUMIN  --   --   --   --   --   --  2.2*  --   CALCIUM 8.0* 8.1* 8.0* 8.0* 8.1* 8.0* 8.3*  --   PHOS  --   --   --   --   --   --  5.4*  --    Liver Function Tests: Recent Labs  Lab 09/10/23 0749  ALBUMIN 2.2*   No results for input(s): "LIPASE", "AMYLASE" in the last 168 hours. No results for input(s): "AMMONIA" in the last 168 hours. CBC: Recent Labs  Lab 09/04/23 0737 09/09/23 2038 09/10/23 0749 09/10/23 1057  WBC 11.6* 10.9* 8.9  --   HGB 8.0* 7.6* 7.0* 7.8*  HCT 24.6* 23.6* 21.9* 23.0*  MCV 89.8 90.8 90.9  --   PLT 243 327 307  --    Cardiac Enzymes: No results for input(s): "CKTOTAL", "CKMB", "CKMBINDEX", "TROPONINI" in the last 168 hours. CBG: Recent Labs  Lab 09/09/23 0726 09/09/23 1117 09/09/23 1620 09/09/23 2023 09/10/23 0730   GLUCAP 123* 115* 180* 97 110*    Iron Studies:  Recent Labs    09/10/23 0749  IRON 45  TIBC 195*   Studies/Results: VAS [place] UPPER EXT VEIN MAPPING (PRE-OP AVF)  Result Date: 09/09/2023 UPPER EXTREMITY VEIN MAPPING Patient Name:  Curtis Hartman  Date of Exam:   09/08/2023 Medical Rec #: [id number]         Accession #:    [id number] Date of Birth: 01/20/1961         Patient Gender: M Patient Age:   60 years Exam Location:  [facility] Procedure:      VAS [place] UPPER EXT VEIN MAPPING (PRE-OP AVF) Referring Phys: Jomarie Longs Verlyn Lambert --------------------------------------------------------------------------------  Indications: Pre-access. Performing Technologist: Fernande Bras Supporting Technologist: Marilynne Halsted RDMS, RVT  Examination Guidelines: A complete evaluation includes B-mode imaging, spectral Doppler, color Doppler, and power Doppler as needed of all accessible portions of each vessel. Bilateral testing is considered an integral part of a complete examination. Limited examinations for reoccurring indications may be performed as noted. +-----------------+-------------+----------+---------+ Right Cephalic  Diameter (cm)Depth (cm)Findings  +-----------------+-------------+----------+---------+ Shoulder             0.37        0.57             +-----------------+-------------+----------+---------+ Prox upper arm       0.33        0.53             +-----------------+-------------+----------+---------+ Mid upper arm        0.32        0.30             +-----------------+-------------+----------+---------+ Dist upper arm       0.27        0.24             +-----------------+-------------+----------+---------+ Antecubital fossa    0.37        0.24   branching +-----------------+-------------+----------+---------+ Prox forearm         0.27        0.21             +-----------------+-------------+----------+---------+ Mid forearm          0.26        0.20              +-----------------+-------------+----------+---------+ Dist forearm         0.20        0.16             +-----------------+-------------+----------+---------+ Wrist                0.23        0.17             +-----------------+-------------+----------+---------+ +-----------------+-------------+----------+---------+ Right Basilic    Diameter (cm)Depth (cm)Findings  +-----------------+-------------+----------+---------+ Shoulder             0.57                         +-----------------+-------------+----------+---------+ Prox upper arm       0.47               branching +-----------------+-------------+----------+---------+ Mid upper arm        0.42                         +-----------------+-------------+----------+---------+ Dist upper arm       0.40                         +-----------------+-------------+----------+---------+ Antecubital fossa    0.45               branching +-----------------+-------------+----------+---------+ Prox forearm         0.15                         +-----------------+-------------+----------+---------+ Mid forearm          0.18                         +-----------------+-------------+----------+---------+ Distal forearm       0.19                         +-----------------+-------------+----------+---------+ Wrist                0.14                         +-----------------+-------------+----------+---------+ +-----------------+-------------+----------+-------------------+  Left Cephalic    Diameter (cm)Depth (cm)     Findings       +-----------------+-------------+----------+-------------------+ Shoulder             0.45        1.00                       +-----------------+-------------+----------+-------------------+ Prox upper arm       0.39        0.67                       +-----------------+-------------+----------+-------------------+ Mid upper arm        0.30        0.41                        +-----------------+-------------+----------+-------------------+ Dist upper arm       0.26        0.22                       +-----------------+-------------+----------+-------------------+ Antecubital fossa    0.42        0.22                       +-----------------+-------------+----------+-------------------+ Prox forearm         0.37        0.34                       +-----------------+-------------+----------+-------------------+ Mid forearm          0.30        0.34                       +-----------------+-------------+----------+-------------------+ Dist forearm         0.30        0.23   Thrombus visualized +-----------------+-------------+----------+-------------------+ Wrist                0.30        0.24                       +-----------------+-------------+----------+-------------------+ +-----------------+-------------+----------+--------+ Left Basilic     Diameter (cm)Depth (cm)Findings +-----------------+-------------+----------+--------+ Shoulder             0.58                        +-----------------+-------------+----------+--------+ Prox upper arm       0.39                        +-----------------+-------------+----------+--------+ Mid upper arm        0.26                        +-----------------+-------------+----------+--------+ Dist upper arm       0.36                        +-----------------+-------------+----------+--------+ Antecubital fossa    0.14                        +-----------------+-------------+----------+--------+ Prox forearm         0.13                        +-----------------+-------------+----------+--------+  Mid forearm          0.14                        +-----------------+-------------+----------+--------+ Distal forearm       0.14                        +-----------------+-------------+----------+--------+ Wrist                0.19                         +-----------------+-------------+----------+--------+ *See table(s) above for measurements and observations.  Diagnosing physician: Coral Else MD Electronically signed by Coral Else MD on 09/09/2023 at 9:25:34 PM.    Final     [MAR Hold] sodium chloride   Intravenous Once   acetaminophen       [MAR Hold] amLODipine  10 mg Oral Daily   [MAR Hold] calcitRIOL  0.25 mcg Oral Once per day on Monday Wednesday Friday   [facility] Hold] calcium acetate  667 mg Oral BID WC   [MAR Hold] carvedilol  3.125 mg Oral BID WC   [MAR Hold] Chlorhexidine Gluconate Cloth  6 each Topical Daily   [MAR Hold] darbepoetin (ARANESP) injection - NON-DIALYSIS  60 mcg Subcutaneous Q Wed-1800   [MAR Hold] docusate sodium  100 mg Oral BID   [MAR Hold] fluticasone furoate-vilanterol  1 puff Inhalation Daily   And   [MAR Hold] umeclidinium bromide  1 puff Inhalation Daily   [MAR Hold] gabapentin  400 mg Oral BID   [MAR Hold] heparin injection (subcutaneous)  5,000 Units Subcutaneous Q8H   [MAR Hold] insulin aspart  0-15 Units Subcutaneous TID WC   [MAR Hold] insulin aspart  0-5 Units Subcutaneous QHS   [MAR Hold] sodium bicarbonate  650 mg Oral TID   [MAR Hold] sodium chloride flush  3 mL Intravenous Q12H   [MAR Hold] tamsulosin  0.4 mg Oral Daily    BMET    Component Value Date/Time   NA 139 09/10/2023 1057   K 4.8 09/10/2023 1057   CL 104 09/10/2023 1057   CO2 24 09/10/2023 0749   GLUCOSE 99 09/10/2023 1057   BUN 92 (H) 09/10/2023 1057   CREATININE 4.80 (H) 09/10/2023 1057   CREATININE 2.78 (H) 11/27/2022 1317   CALCIUM 8.3 (L) 09/10/2023 0749   GFRNONAA 13 (L) 09/10/2023 0749   CBC    Component Value Date/Time   WBC 8.9 09/10/2023 0749   RBC 2.41 (L) 09/10/2023 0749   HGB 7.8 (L) 09/10/2023 1057   HCT 23.0 (L) 09/10/2023 1057   PLT 307 09/10/2023 0749   MCV 90.9 09/10/2023 0749   MCH 29.0 09/10/2023 0749   MCHC 32.0 09/10/2023 0749   RDW 13.0 09/10/2023 0749   LYMPHSABS 1,723 11/27/2022 1317    EOSABS 218 11/27/2022 1317   BASOSABS 61 11/27/2022 1317    Pt with PMH significant for COPD, HTN, DM type II, IBS, multi-level DDD, and 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 Wolfgang Phoenix) who presented for lumbar interbody fusion of L2-L3, L3-L4, L4-L5 on 09/01/23.  His hospital course was complicated by AKI/CKD stage IV and we were consulted to further evaluate and manage his AKI.  He has had progressive CKD and was told by Dr. Wolfgang Phoenix that he was nearing the need for dialysis at his  last visit 2 weeks ago.  He denies any N/V/dysgeusia, anorexia, lower extremity edema.   Assessment and plan: AKI/CKD stage IV - likely multifactorial with contrast nephropathy, ischemic ATN, ABLA with concomitant ARB therapy, acute urinary retention.  Olmesartan is on hold.  He did receive IVF's after surgery but had several episodes of diarrhea over the past 72 hours.  No uremic symptoms or indication for dialysis.  Despite starting IVF's yesterday his I's/O's remain negative by 2 liters.  Increased rate of IVF's yesterday with improved Scr to 4.82 this am.  Continue to follow UOP and daily SCr.  Hopefully his Scr will continue improve off of ARB and increased intake.  -No indication to initiate dialysis at this time. Avoid nephrotoxic medications including NSAIDs and iodinated intravenous contrast exposure unless the latter is absolutely indicated.   Preferred narcotic agents for pain control are hydromorphone, fentanyl, and methadone. Morphine should not be used.  Avoid Baclofen and avoid oral sodium phosphate and magnesium citrate based laxatives / bowel preps.  Continue strict Input and Output monitoring. Will monitor the patient closely with you and intervene or adjust therapy as indicated by changes in clinical status/labs  Vascular access He is nearing the need for HD and have consulted [facility] for vein mapping and for LUE AVF creation today  with Dr. Hetty Blend.   Acute urinary retention - improved UOP after foley catheter placed.  Currently on flomax.  Continue with foley catheter HTN/Volume - stable for now. ABLA - follow H/H and transfuse for hgb <7.  Will start ESA and check iron stores. 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  Now with hyperphosphatemia.  Continue with phoslo qac and follow. DM type II - per primary Lumbar stenosis - s/p lumbar spine fusion.  Per Neurosurgery. Metabolic acidosis - continue with home sodium bicarb dose and follow.    Irena Cords, MD [facility]

## 2023-09-10 NOTE — Progress Notes (Addendum)
  Progress Note    09/10/2023 7:37 AM 9 Days Post-Op  Subjective:  no complaints   Vitals:   09/09/23 2237 09/10/23 0619  BP: (!) 145/79 (!) 142/73  Pulse:    Resp: 18 18  Temp: 99.1 F (37.3 C) 98.9 F (37.2 C)  SpO2: 99% 98%   Physical Exam: Lungs:  non labored Extremities:  palpable L radial Neurologic: A&O  CBC    Component Value Date/Time   WBC 10.9 (H) 09/09/2023 2038   RBC 2.60 (L) 09/09/2023 2038   HGB 7.6 (L) 09/09/2023 2038   HCT 23.6 (L) 09/09/2023 2038   PLT 327 09/09/2023 2038   MCV 90.8 09/09/2023 2038   MCH 29.2 09/09/2023 2038   MCHC 32.2 09/09/2023 2038   RDW 13.1 09/09/2023 2038   LYMPHSABS 1,723 11/27/2022 1317   EOSABS 218 11/27/2022 1317   BASOSABS 61 11/27/2022 1317    BMET    Component Value Date/Time   NA 137 09/09/2023 0554   K 4.9 09/09/2023 0554   CL 107 09/09/2023 0554   CO2 22 09/09/2023 0554   GLUCOSE 131 (H) 09/09/2023 0554   BUN 92 (H) 09/09/2023 0554   CREATININE 5.27 (H) 09/09/2023 0554   CREATININE 2.78 (H) 11/27/2022 1317   CALCIUM 8.0 (L) 09/09/2023 0554   GFRNONAA 12 (L) 09/09/2023 0554    INR    Component Value Date/Time   INR 0.9 09/09/2021 0630     Intake/Output Summary (Last 24 hours) at 09/10/2023 0737 Last data filed at 09/10/2023 0636 Gross per 24 hour  Intake 240 ml  Output 2300 ml  Net -2060 ml     Assessment/Plan:  62 y.o. male with CKD 9 Days Post-Op   Plan is for L arm AVF vs graft placement today with Dr. Hetty Blend All questions answered Continue npo Consent ordered   Emilie Rutter, PA-C [facility] [phone] 09/10/2023 7:37 AM   VASCULAR STAFF ADDENDUM: I have independently interviewed and examined the patient. I agree with the above.  Plan for L BC AVF creation  Daria Pastures MD [facility] Phone Number: [phone] 09/10/2023 11:01 AM

## 2023-09-10 NOTE — Anesthesia Preprocedure Evaluation (Addendum)
Anesthesia Evaluation  Patient identified by MRN, date of birth, ID band Patient awake    Reviewed: Allergy & Precautions, NPO status , Patient's Chart, lab work & pertinent test results  Airway Mallampati: II  TM Distance: >3 FB Neck ROM: Full    Dental  (+) Dental Advisory Given, Poor Dentition, Missing   Pulmonary COPD, Current Smoker and Patient abstained from smoking.   Pulmonary exam normal breath sounds clear to auscultation       Cardiovascular hypertension, Pt. on medications Normal cardiovascular exam Rhythm:Regular Rate:Normal     Neuro/Psych  Headaches  Neuromuscular disease    GI/Hepatic ,GERD  ,,(+) Hepatitis -, C  Endo/Other  diabetes, Poorly Controlled, Type 2, Insulin Dependent    Renal/GU ESRFRenal disease     Musculoskeletal  (+) Arthritis ,    Abdominal   Peds  Hematology  (+) Blood dyscrasia, anemia   Anesthesia Other Findings Day of surgery medications reviewed with the patient.  Reproductive/Obstetrics                             Anesthesia Physical Anesthesia Plan  ASA: 3  Anesthesia Plan: Regional   Post-op Pain Management: Tylenol PO (pre-op)*   Induction: Intravenous  PONV Risk Score and Plan: 0 and TIVA, Midazolam, Dexamethasone and Ondansetron  Airway Management Planned: Natural Airway and Simple Face Mask  Additional Equipment:   Intra-op Plan:   Post-operative Plan:   Informed Consent: I have reviewed the patients History and Physical, chart, labs and discussed the procedure including the risks, benefits and alternatives for the proposed anesthesia with the patient or authorized representative who has indicated his/her understanding and acceptance.     Dental advisory given  Plan Discussed with: CRNA  Anesthesia Plan Comments:         Anesthesia Quick Evaluation

## 2023-09-10 NOTE — Plan of Care (Signed)
  Problem: Education: Goal: Knowledge of General Education information will improve Description Including pain rating scale, medication(s)/side effects and non-pharmacologic comfort measures Outcome: Progressing   Problem: Health Behavior/Discharge Planning: Goal: Ability to manage health-related needs will improve Outcome: Progressing   

## 2023-09-10 NOTE — Progress Notes (Signed)
    Providing Compassionate, Quality Care - Together   NEUROSURGERY PROGRESS NOTE     S: No issues overnight. Pt going to OR today for graft vs fistula.    O: EXAM:  BP (!) 142/73 (BP Location: Right Arm)   Pulse 80   Temp 98.9 F (37.2 C) (Oral)   Resp 18   Ht 5\' 9"  (1.753 m)   Wt 71.2 kg   SpO2 98%   BMI 23.18 kg/m     Awake, alert, oriented  Speech fluent, appropriate  MAEs  ASSESSMENT:  62 y.o. s/p L2-5 DLIF    PLAN: -CIR pending -Cont supportive care -Call w/ questions/concerns.   Patrici Ranks, [facility]

## 2023-09-10 NOTE — Anesthesia Procedure Notes (Signed)
Anesthesia Regional Block: Supraclavicular block   Pre-Anesthetic Checklist: , timeout performed,  Correct Patient, Correct Site, Correct Laterality,  Correct Procedure, Correct Position, site marked,  Risks and benefits discussed,  Surgical consent,  Pre-op evaluation,  At surgeon's request and post-op pain management  Laterality: Left  Prep: chloraprep       Needles:  Injection technique: Single-shot  Needle Type: Echogenic Needle     Needle Length: 9cm  Needle Gauge: 21     Additional Needles:   Procedures:,,,, ultrasound used (permanent image in chart),,    Narrative:  Start time: 09/10/2023 11:00 AM End time: 09/10/2023 11:07 AM Injection made incrementally with aspirations every 5 mL.  Performed by: Personally  Anesthesiologist: Collene Schlichter, MD  Additional Notes: No pain on injection. No increased resistance to injection. Injection made in 5cc increments.  Good needle visualization.  Patient tolerated procedure well.

## 2023-09-10 NOTE — Progress Notes (Signed)
Inpatient Rehab Admissions Coordinator:   Awaiting determination from insurance regarding CIR prior auth request.  Note to OR today for HD access.  Discussed with Dr. [place] who confirms no immediate plan to initiate HD.  We will continue to follow for potential admit pending insurance approval and bed availability.   Estill Dooms, PT, DPT Admissions Coordinator [phone] 09/10/23  11:09 AM

## 2023-09-11 ENCOUNTER — Encounter (HOSPITAL_COMMUNITY): Payer: Self-pay | Admitting: Vascular Surgery

## 2023-09-11 ENCOUNTER — Inpatient Hospital Stay (HOSPITAL_COMMUNITY)
Admission: RE | Admit: 2023-09-11 | Discharge: 2023-09-24 | DRG: 560 | Disposition: A | Discharge status: Home / Self Care | Payer: Medicaid Other | Source: Intra-hospital | Attending: Physical Medicine and Rehabilitation | Admitting: Physical Medicine and Rehabilitation

## 2023-09-11 ENCOUNTER — Other Ambulatory Visit: Payer: Self-pay

## 2023-09-11 DIAGNOSIS — D631 Anemia in chronic kidney disease: Secondary | ICD-10-CM | POA: Diagnosis present

## 2023-09-11 DIAGNOSIS — E1122 Type 2 diabetes mellitus with diabetic chronic kidney disease: Secondary | ICD-10-CM | POA: Diagnosis present

## 2023-09-11 DIAGNOSIS — R109 Unspecified abdominal pain: Secondary | ICD-10-CM | POA: Diagnosis not present

## 2023-09-11 DIAGNOSIS — F54 Psychological and behavioral factors associated with disorders or diseases classified elsewhere: Secondary | ICD-10-CM | POA: Diagnosis not present

## 2023-09-11 DIAGNOSIS — F17210 Nicotine dependence, cigarettes, uncomplicated: Secondary | ICD-10-CM | POA: Diagnosis present

## 2023-09-11 DIAGNOSIS — R339 Retention of urine, unspecified: Secondary | ICD-10-CM | POA: Diagnosis not present

## 2023-09-11 DIAGNOSIS — N179 Acute kidney failure, unspecified: Secondary | ICD-10-CM | POA: Diagnosis present

## 2023-09-11 DIAGNOSIS — M25511 Pain in right shoulder: Secondary | ICD-10-CM

## 2023-09-11 DIAGNOSIS — M5416 Radiculopathy, lumbar region: Secondary | ICD-10-CM | POA: Diagnosis present

## 2023-09-11 DIAGNOSIS — S46111D Strain of muscle, fascia and tendon of long head of biceps, right arm, subsequent encounter: Secondary | ICD-10-CM

## 2023-09-11 DIAGNOSIS — N184 Chronic kidney disease, stage 4 (severe): Secondary | ICD-10-CM | POA: Diagnosis present

## 2023-09-11 DIAGNOSIS — Z981 Arthrodesis status: Secondary | ICD-10-CM

## 2023-09-11 DIAGNOSIS — J449 Chronic obstructive pulmonary disease, unspecified: Secondary | ICD-10-CM | POA: Diagnosis present

## 2023-09-11 DIAGNOSIS — S46011D Strain of muscle(s) and tendon(s) of the rotator cuff of right shoulder, subsequent encounter: Secondary | ICD-10-CM | POA: Diagnosis not present

## 2023-09-11 DIAGNOSIS — Z79899 Other long term (current) drug therapy: Secondary | ICD-10-CM

## 2023-09-11 DIAGNOSIS — I160 Hypertensive urgency: Secondary | ICD-10-CM | POA: Diagnosis present

## 2023-09-11 DIAGNOSIS — M48061 Spinal stenosis, lumbar region without neurogenic claudication: Secondary | ICD-10-CM | POA: Diagnosis present

## 2023-09-11 DIAGNOSIS — Z4802 Encounter for removal of sutures: Secondary | ICD-10-CM

## 2023-09-11 DIAGNOSIS — Z8249 Family history of ischemic heart disease and other diseases of the circulatory system: Secondary | ICD-10-CM

## 2023-09-11 DIAGNOSIS — K5901 Slow transit constipation: Secondary | ICD-10-CM | POA: Diagnosis not present

## 2023-09-11 DIAGNOSIS — M48062 Spinal stenosis, lumbar region with neurogenic claudication: Secondary | ICD-10-CM | POA: Diagnosis not present

## 2023-09-11 DIAGNOSIS — I129 Hypertensive chronic kidney disease with stage 1 through stage 4 chronic kidney disease, or unspecified chronic kidney disease: Secondary | ICD-10-CM | POA: Diagnosis present

## 2023-09-11 DIAGNOSIS — R195 Other fecal abnormalities: Secondary | ICD-10-CM | POA: Diagnosis present

## 2023-09-11 DIAGNOSIS — S46001D Unspecified injury of muscle(s) and tendon(s) of the rotator cuff of right shoulder, subsequent encounter: Secondary | ICD-10-CM | POA: Diagnosis not present

## 2023-09-11 DIAGNOSIS — K589 Irritable bowel syndrome without diarrhea: Secondary | ICD-10-CM | POA: Diagnosis present

## 2023-09-11 DIAGNOSIS — R338 Other retention of urine: Secondary | ICD-10-CM | POA: Diagnosis present

## 2023-09-11 DIAGNOSIS — N1411 Contrast-induced nephropathy: Secondary | ICD-10-CM | POA: Diagnosis present

## 2023-09-11 DIAGNOSIS — Z4789 Encounter for other orthopedic aftercare: Principal | ICD-10-CM

## 2023-09-11 DIAGNOSIS — E875 Hyperkalemia: Secondary | ICD-10-CM | POA: Diagnosis present

## 2023-09-11 DIAGNOSIS — Z823 Family history of stroke: Secondary | ICD-10-CM

## 2023-09-11 DIAGNOSIS — K219 Gastro-esophageal reflux disease without esophagitis: Secondary | ICD-10-CM | POA: Diagnosis present

## 2023-09-11 DIAGNOSIS — M25512 Pain in left shoulder: Secondary | ICD-10-CM | POA: Diagnosis not present

## 2023-09-11 DIAGNOSIS — Z794 Long term (current) use of insulin: Secondary | ICD-10-CM

## 2023-09-11 DIAGNOSIS — E872 Acidosis, unspecified: Secondary | ICD-10-CM | POA: Diagnosis present

## 2023-09-11 DIAGNOSIS — N401 Enlarged prostate with lower urinary tract symptoms: Secondary | ICD-10-CM | POA: Diagnosis present

## 2023-09-11 DIAGNOSIS — R152 Fecal urgency: Secondary | ICD-10-CM | POA: Diagnosis not present

## 2023-09-11 DIAGNOSIS — G8918 Other acute postprocedural pain: Secondary | ICD-10-CM | POA: Diagnosis not present

## 2023-09-11 DIAGNOSIS — T508X5A Adverse effect of diagnostic agents, initial encounter: Secondary | ICD-10-CM | POA: Diagnosis present

## 2023-09-11 LAB — RENAL FUNCTION PANEL
Albumin: 2.2 g/dL — ABNORMAL LOW (ref 3.5–5.0)
Anion gap: 10 (ref 5–15)
BUN: 85 mg/dL — ABNORMAL HIGH (ref 8–23)
CO2: 22 mmol/L (ref 22–32)
Calcium: 7.7 mg/dL — ABNORMAL LOW (ref 8.9–10.3)
Chloride: 107 mmol/L (ref 98–111)
Creatinine, Ser: 5.07 mg/dL — ABNORMAL HIGH (ref 0.61–1.24)
GFR, Estimated: 12 mL/min — ABNORMAL LOW (ref >60)
Glucose, Bld: 106 mg/dL — ABNORMAL HIGH (ref 70–99)
Phosphorus: 5.6 mg/dL — ABNORMAL HIGH (ref 2.5–4.6)
Potassium: 5.3 mmol/L — ABNORMAL HIGH (ref 3.5–5.1)
Sodium: 139 mmol/L (ref 135–145)

## 2023-09-11 LAB — TYPE AND SCREEN
ABO/RH(D): A POS
Antibody Screen: NEGATIVE
Unit division: 0

## 2023-09-11 LAB — GLUCOSE, CAPILLARY
Glucose-Capillary: 132 mg/dL — ABNORMAL HIGH (ref 70–99)
Glucose-Capillary: 111 mg/dL — ABNORMAL HIGH (ref 70–99)
Glucose-Capillary: 111 mg/dL — ABNORMAL HIGH (ref 70–99)
Glucose-Capillary: 158 mg/dL — ABNORMAL HIGH (ref 70–99)

## 2023-09-11 LAB — CBC
HCT: 24.4 % — ABNORMAL LOW (ref 39.0–52.0)
Hemoglobin: 7.9 g/dL — ABNORMAL LOW (ref 13.0–17.0)
MCH: 29.4 pg (ref 26.0–34.0)
MCHC: 32.4 g/dL (ref 30.0–36.0)
MCV: 90.7 fL (ref 80.0–100.0)
Platelets: 317 10*3/uL (ref 150–400)
RBC: 2.69 MIL/uL — ABNORMAL LOW (ref 4.22–5.81)
RDW: 13.6 % (ref 11.5–15.5)
WBC: 10.6 10*3/uL — ABNORMAL HIGH (ref 4.0–10.5)
nRBC: 0 % (ref 0.0–0.2)

## 2023-09-11 LAB — BPAM RBC
Blood Product Expiration Date: 202411272359
ISSUE DATE / TIME: 202411072015
Unit Type and Rh: 6200

## 2023-09-11 MED ORDER — POLYETHYLENE GLYCOL 3350 17 G PO PACK
17.0000 g | PACK | Freq: Every day | ORAL | Status: DC | PRN
Start: 2023-09-11 — End: 2023-09-24

## 2023-09-11 MED ORDER — CALCIUM ACETATE (PHOS BINDER) 667 MG PO CAPS
667.0000 mg | ORAL_CAPSULE | Freq: Two times a day (BID) | ORAL | Status: DC
  Administered 2023-09-11 – 2023-09-24 (×26): 667 mg via ORAL
  Filled 2023-09-11 (×28): qty 1

## 2023-09-11 MED ORDER — DOCUSATE SODIUM 100 MG PO CAPS
100.0000 mg | ORAL_CAPSULE | Freq: Two times a day (BID) | ORAL | Status: DC
  Administered 2023-09-12 – 2023-09-16 (×8): 100 mg via ORAL
  Filled 2023-09-11 (×11): qty 1

## 2023-09-11 MED ORDER — IRON SUCROSE 200 MG IVPB - SIMPLE MED
200.0000 mg | Freq: Every day | Status: AC
Start: 2023-09-12 — End: 2023-09-14
  Administered 2023-09-12 – 2023-09-13 (×2): 200 mg via INTRAVENOUS
  Filled 2023-09-11 (×2): qty 10

## 2023-09-11 MED ORDER — TAMSULOSIN HCL 0.4 MG PO CAPS
0.4000 mg | ORAL_CAPSULE | Freq: Every day | ORAL | Status: DC
  Administered 2023-09-12 – 2023-09-24 (×13): 0.4 mg via ORAL
  Filled 2023-09-11 (×13): qty 1

## 2023-09-11 MED ORDER — ONDANSETRON HCL 4 MG PO TABS
4.0000 mg | ORAL_TABLET | Freq: Four times a day (QID) | ORAL | Status: DC | PRN
Start: 2023-09-11 — End: 2023-09-24

## 2023-09-11 MED ORDER — CALCITRIOL 0.25 MCG PO CAPS
0.2500 ug | ORAL_CAPSULE | ORAL | Status: DC
  Administered 2023-09-14 – 2023-09-23 (×5): 0.25 ug via ORAL
  Filled 2023-09-11 (×5): qty 1

## 2023-09-11 MED ORDER — AMLODIPINE BESYLATE 10 MG PO TABS: 10.0000 mg | ORAL_TABLET | Freq: Every day | ORAL | Status: DC | PRN

## 2023-09-11 MED ORDER — GUAIFENESIN-DM 100-10 MG/5ML PO SYRP: 10.0000 mL | ORAL_SOLUTION | Freq: Four times a day (QID) | ORAL | Status: DC | PRN

## 2023-09-11 MED ORDER — HEPARIN SODIUM (PORCINE) 5000 UNIT/ML IJ SOLN
5000.0000 [IU] | Freq: Three times a day (TID) | INTRAMUSCULAR | Status: DC
  Administered 2023-09-11 – 2023-09-24 (×40): 5000 [IU] via SUBCUTANEOUS
  Filled 2023-09-11 (×40): qty 1

## 2023-09-11 MED ORDER — AMLODIPINE BESYLATE 10 MG PO TABS
10.0000 mg | ORAL_TABLET | Freq: Every day | ORAL | Status: DC
  Administered 2023-09-12 – 2023-09-24 (×13): 10 mg via ORAL
  Filled 2023-09-11 (×13): qty 1

## 2023-09-11 MED ORDER — DIPHENHYDRAMINE HCL 25 MG PO CAPS: 25.0000 mg | ORAL_CAPSULE | Freq: Four times a day (QID) | ORAL | Status: DC | PRN

## 2023-09-11 MED ORDER — OXYCODONE HCL 5 MG PO TABS
5.0000 mg | ORAL_TABLET | ORAL | Status: DC | PRN
  Administered 2023-09-12: 5 mg via ORAL
  Filled 2023-09-11: qty 1

## 2023-09-11 MED ORDER — METHOCARBAMOL 500 MG PO TABS
500.0000 mg | ORAL_TABLET | Freq: Four times a day (QID) | ORAL | Status: DC | PRN
  Administered 2023-09-11 – 2023-09-22 (×16): 500 mg via ORAL
  Filled 2023-09-11 (×16): qty 1

## 2023-09-11 MED ORDER — KIDNEY FAILURE BOOK: Freq: Once | Status: AC

## 2023-09-11 MED ORDER — ALBUTEROL SULFATE (2.5 MG/3ML) 0.083% IN NEBU: 3.0000 mL | INHALATION_SOLUTION | Freq: Four times a day (QID) | RESPIRATORY_TRACT | Status: DC | PRN

## 2023-09-11 MED ORDER — UMECLIDINIUM BROMIDE 62.5 MCG/ACT IN AEPB
1.0000 | INHALATION_SPRAY | Freq: Every day | RESPIRATORY_TRACT | Status: DC
  Administered 2023-09-12 – 2023-09-22 (×9): 1 via RESPIRATORY_TRACT
  Filled 2023-09-11: qty 7

## 2023-09-11 MED ORDER — CARVEDILOL 3.125 MG PO TABS
3.1250 mg | ORAL_TABLET | Freq: Two times a day (BID) | ORAL | Status: DC
  Administered 2023-09-11 – 2023-09-24 (×26): 3.125 mg via ORAL
  Filled 2023-09-11 (×27): qty 1

## 2023-09-11 MED ORDER — ONDANSETRON HCL 4 MG/2ML IJ SOLN
4.0000 mg | Freq: Four times a day (QID) | INTRAMUSCULAR | Status: DC | PRN
Start: 2023-09-11 — End: 2023-09-24

## 2023-09-11 MED ORDER — LOPERAMIDE HCL 2 MG PO CAPS: 2.0000 mg | ORAL_CAPSULE | ORAL | Status: DC | PRN

## 2023-09-11 MED ORDER — SODIUM CHLORIDE 0.9 % IV SOLN
200.0000 mg | Freq: Every day | INTRAVENOUS | Status: DC
  Filled 2023-09-11: qty 10

## 2023-09-11 MED ORDER — HEPARIN SODIUM (PORCINE) 5000 UNIT/ML IJ SOLN: 5000.0000 [IU] | Freq: Three times a day (TID) | INTRAMUSCULAR | Status: DC

## 2023-09-11 MED ORDER — ACETAMINOPHEN 325 MG PO TABS
325.0000 mg | ORAL_TABLET | ORAL | Status: DC | PRN
  Administered 2023-09-11 – 2023-09-16 (×10): 650 mg via ORAL
  Administered 2023-09-16: 325 mg via ORAL
  Administered 2023-09-17: 650 mg via ORAL
  Administered 2023-09-17: 325 mg via ORAL
  Administered 2023-09-19 – 2023-09-22 (×3): 650 mg via ORAL
  Filled 2023-09-11 (×16): qty 2

## 2023-09-11 MED ORDER — SODIUM CHLORIDE 0.9% FLUSH
3.0000 mL | Freq: Two times a day (BID) | INTRAVENOUS | Status: DC
  Administered 2023-09-11 – 2023-09-13 (×4): 3 mL via INTRAVENOUS

## 2023-09-11 MED ORDER — MELATONIN 5 MG PO TABS
5.0000 mg | ORAL_TABLET | Freq: Every evening | ORAL | Status: DC | PRN
  Administered 2023-09-11 – 2023-09-23 (×9): 5 mg via ORAL
  Filled 2023-09-11 (×9): qty 1

## 2023-09-11 MED ORDER — INSULIN ASPART 100 UNIT/ML IJ SOLN
0.0000 [IU] | Freq: Three times a day (TID) | INTRAMUSCULAR | Status: DC
Start: 2023-09-11 — End: 2023-09-24
  Administered 2023-09-16 – 2023-09-23 (×7): 2 [IU] via SUBCUTANEOUS

## 2023-09-11 MED ORDER — CHLORHEXIDINE GLUCONATE CLOTH 2 % EX PADS: 6.0000 | MEDICATED_PAD | Freq: Every day | CUTANEOUS | Status: DC

## 2023-09-11 MED ORDER — IRON SUCROSE 200 MG IVPB - SIMPLE MED
200.0000 mg | Freq: Every day | Status: DC
  Filled 2023-09-11: qty 110

## 2023-09-11 MED ORDER — SODIUM BICARBONATE 650 MG PO TABS
650.0000 mg | ORAL_TABLET | Freq: Three times a day (TID) | ORAL | Status: DC
  Administered 2023-09-11 – 2023-09-24 (×40): 650 mg via ORAL
  Filled 2023-09-11 (×40): qty 1

## 2023-09-11 MED ORDER — INSULIN ASPART 100 UNIT/ML IJ SOLN: 0.0000 [IU] | Freq: Every day | INTRAMUSCULAR | Status: DC

## 2023-09-11 MED ORDER — GABAPENTIN 400 MG PO CAPS
400.0000 mg | ORAL_CAPSULE | Freq: Two times a day (BID) | ORAL | Status: DC
  Administered 2023-09-11 – 2023-09-12 (×2): 400 mg via ORAL
  Filled 2023-09-11 (×2): qty 1

## 2023-09-11 MED ORDER — HYDRALAZINE HCL 20 MG/ML IJ SOLN
5.0000 mg | INTRAMUSCULAR | Status: DC | PRN
  Filled 2023-09-11: qty 0.25

## 2023-09-11 MED ORDER — SODIUM CHLORIDE 0.9% FLUSH: 3.0000 mL | INTRAVENOUS | Status: DC | PRN

## 2023-09-11 MED ORDER — DARBEPOETIN ALFA 60 MCG/0.3ML IJ SOSY
60.0000 ug | PREFILLED_SYRINGE | INTRAMUSCULAR | Status: DC
  Administered 2023-09-16 – 2023-09-23 (×2): 60 ug via SUBCUTANEOUS
  Filled 2023-09-11 (×2): qty 0.3

## 2023-09-11 MED ORDER — FLUTICASONE FUROATE-VILANTEROL 100-25 MCG/ACT IN AEPB
1.0000 | INHALATION_SPRAY | Freq: Every day | RESPIRATORY_TRACT | Status: DC
  Administered 2023-09-12 – 2023-09-23 (×10): 1 via RESPIRATORY_TRACT
  Filled 2023-09-11: qty 28

## 2023-09-11 MED ORDER — OXYCODONE HCL 5 MG PO TABS
10.0000 mg | ORAL_TABLET | ORAL | Status: DC | PRN
  Administered 2023-09-11 – 2023-09-24 (×53): 10 mg via ORAL
  Filled 2023-09-11 (×54): qty 2

## 2023-09-11 NOTE — Progress Notes (Signed)
Inpatient Rehab Admissions Coordinator:    I have insurance approval and a bed available for pt to admit to CIR today. Dr. Maisie Fus in agreement.  Will let pt/family and TOC team know.   Estill Dooms, PT, DPT Admissions Coordinator [phone] 09/11/23  10:07 AM

## 2023-09-11 NOTE — Progress Notes (Signed)
Patient ID: Curtis Hartman, male   DOB: [birth year], 62 y.o.   MRN: [id number] S: Feels well today with no complaints O:BP (!) 111/58 (BP Location: Right Arm)   Pulse 84   Temp 98.8 F (37.1 C) (Oral)   Resp 18   Ht 5\' 9"  (1.753 m)   Wt 71.2 kg   SpO2 97%   BMI 23.18 kg/m   Intake/Output Summary (Last 24 hours) at 09/11/2023 1021 Last data filed at 09/11/2023 0549 Gross per 24 hour  Intake 720 ml  Output 2010 ml  Net -1290 ml   Intake/Output: I/O last 3 completed shifts: In: 720 [I.V.:400; Blood:310; IV Piggyback:10] Out: 3510 [Urine:3500; Blood:10]  Intake/Output this shift:  No intake/output data recorded. Weight change:  Gen:NAD lying in bed CVS: Normal rate, no rub Resp: Bilateral chest rise with no increased work of breathing Abd: +BS, soft, TN/ND Ext: no edema  Recent Labs  Lab 09/05/23 0322 09/06/23 0317 09/07/23 0751 09/08/23 0634 09/09/23 0554 09/10/23 0749 09/10/23 1057 09/11/23 0612  NA 137 140 139 136 137 139 139 139  K 3.7 3.8 4.0 4.6 4.9 4.7 4.8 5.3*  CL 104 110 110 107 107 105 104 107  CO2 19* 20* 19* 21* 22 24  --  22  GLUCOSE 127* 128* 121* 115* 131* 105* 99 106*  BUN 76* 82* 81* 83* 92* 89* 92* 85*  CREATININE 4.85* 5.19* 5.03* 5.11* 5.27* 4.82* 4.80* 5.07*  ALBUMIN  --   --   --   --   --  2.2*  --  2.2*  CALCIUM 8.1* 8.0* 8.0* 8.1* 8.0* 8.3*  --  7.7*  PHOS  --   --   --   --   --  5.4*  --  5.6*   Liver Function Tests: Recent Labs  Lab 09/10/23 0749 09/11/23 0612  ALBUMIN 2.2* 2.2*   No results for input(s): "LIPASE", "AMYLASE" in the last 168 hours. No results for input(s): "AMMONIA" in the last 168 hours. CBC: Recent Labs  Lab 09/09/23 2038 09/10/23 0749 09/10/23 1057 09/11/23 0612  WBC 10.9* 8.9  --  10.6*  HGB 7.6* 7.0* 7.8* 7.9*  HCT 23.6* 21.9* 23.0* 24.4*  MCV 90.8 90.9  --  90.7  PLT 327 307  --  317   Cardiac Enzymes: No results for input(s): "CKTOTAL", "CKMB", "CKMBINDEX", "TROPONINI" in the last 168  hours. CBG: Recent Labs  Lab 09/10/23 0730 09/10/23 1251 09/10/23 1656 09/10/23 2015 09/11/23 0733  GLUCAP 110* 79 96 144* 132*    Iron Studies:  Recent Labs    09/10/23 0749  IRON 45  TIBC 195*   Studies/Results: No results found.  amLODipine  10 mg Oral Daily   calcitRIOL  0.25 mcg Oral Once per day on Monday Wednesday Friday   calcium acetate  667 mg Oral BID WC   carvedilol  3.125 mg Oral BID WC   Chlorhexidine Gluconate Cloth  6 each Topical Daily   darbepoetin (ARANESP) injection - NON-DIALYSIS  60 mcg Subcutaneous Q Wed-1800   docusate sodium  100 mg Oral BID   fluticasone furoate-vilanterol  1 puff Inhalation Daily   And   umeclidinium bromide  1 puff Inhalation Daily   gabapentin  400 mg Oral BID   heparin injection (subcutaneous)  5,000 Units Subcutaneous Q8H   insulin aspart  0-15 Units Subcutaneous TID WC   insulin aspart  0-5 Units Subcutaneous QHS   sodium bicarbonate  650 mg Oral TID   sodium  chloride flush  3 mL Intravenous Q12H   tamsulosin  0.4 mg Oral Daily    BMET    Component Value Date/Time   NA 139 09/11/2023 0612   K 5.3 (H) 09/11/2023 0612   CL 107 09/11/2023 0612   CO2 22 09/11/2023 0612   GLUCOSE 106 (H) 09/11/2023 0612   BUN 85 (H) 09/11/2023 0612   CREATININE 5.07 (H) 09/11/2023 0612   CREATININE 2.78 (H) 11/27/2022 1317   CALCIUM 7.7 (L) 09/11/2023 0612   GFRNONAA 12 (L) 09/11/2023 0612   CBC    Component Value Date/Time   WBC 10.6 (H) 09/11/2023 0612   RBC 2.69 (L) 09/11/2023 0612   HGB 7.9 (L) 09/11/2023 0612   HCT 24.4 (L) 09/11/2023 0612   PLT 317 09/11/2023 0612   MCV 90.7 09/11/2023 0612   MCH 29.4 09/11/2023 0612   MCHC 32.4 09/11/2023 0612   RDW 13.6 09/11/2023 0612   LYMPHSABS 1,723 11/27/2022 1317   EOSABS 218 11/27/2022 1317   BASOSABS 61 11/27/2022 1317    Pt with PMH significant for COPD, HTN, DM type II, IBS, multi-level DDD, and CKD Stage IV (progressive and biopsy consistent with diabetic nephropathy  as well as FSGS tip lesion followed by Dr. Wolfgang Phoenix) who presented for lumbar interbody fusion of L2-L3, L3-L4, L4-L5 on 09/01/23.  His hospital course was complicated by AKI/CKD stage IV and we were consulted to further evaluate and manage his AKI.  He has had progressive CKD and was told by Dr. Wolfgang Phoenix that he was nearing the need for dialysis at his last visit 2 weeks ago.  He denies any N/V/dysgeusia, anorexia, lower extremity edema.   Assessment and plan: AKI/CKD stage IV - likely multifactorial with contrast nephropathy, ischemic ATN, ABLA with concomitant ARB therapy, acute urinary retention.  Creatinine overall stable around 5.  Has not needed dialysis at this time Avoid nephrotoxic medications including NSAIDs and iodinated intravenous contrast exposure unless the latter is absolutely indicated.   Preferred narcotic agents for pain control are hydromorphone, fentanyl, and methadone. Morphine should not be used.  Avoid Baclofen and avoid oral sodium phosphate and magnesium citrate based laxatives / bowel preps.  Continue strict Input and Output monitoring. Will monitor the patient closely with you and intervene or adjust therapy as indicated by changes in clinical status/labs  Vascular access He is nearing the need for HD and underwent AVF creation on 09/10/2023.  Appreciate help from vascular surgery Acute urinary retention - improved UOP after foley catheter placed.  Currently on flomax.  Continue with foley catheter as long as it is needed HTN/Volume - stable for now. ABLA - follow H/H and transfuse for hgb <7.  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  Now with hyperphosphatemia.  Continue with phoslo qac and follow. DM type II - per primary Lumbar stenosis - s/p lumbar spine fusion.  Per Neurosurgery. Metabolic acidosis - continue with home sodium bicarb dose and follow.    Plan for CIR

## 2023-09-11 NOTE — Progress Notes (Signed)
Patient ID: Curtis Hartman, male   DOB: 1961-10-29, 62 y.o.   MRN: [id number]  Sutures removed from right 4th toe previous surgery. Patient tolerated procedure well. Recommend Betadine BID to dry moisture in between toes. Received verbal order from Dr. Shearon Stalls. Will place order for BID Betadine.

## 2023-09-11 NOTE — Progress Notes (Signed)
Inpatient Rehabilitation Admission Medication Review by a Pharmacist   A complete drug regimen review was completed for this patient to identify any potential clinically significant medication issues.   High Risk Drug Classes Is patient taking? Indication by Medication  Antipsychotic No    Anticoagulant Yes Heparin - DVT Px  Antibiotic No    Opioid Yes Oxycodone - pain  Antiplatelet No    Hypoglycemics/insulin Yes Novolog - DM  Vasoactive Medication Yes Norvasc , coreg, hydralazine - HTN  Chemotherapy No    Other Yes Tylenol - pain  Albuterol - asthma  Calcitriol - hypoparathyroidism Phoslo - phosphate binder Arenesp - anemia  Breo ellipta - COPD Incruse ellipta - COPD Robaxin - muscle relaxer Zofran - nausea/ vomiting  Miralax - constipation Flomax - BPH Benadryl - itching Melatonin - sleep        Type of Medication Issue Identified Description of Issue Recommendation(s)  Drug Interaction(s) (clinically significant)        Duplicate Therapy        Allergy        No Medication Administration End Date        Incorrect Dose        Additional Drug Therapy Needed        Significant med changes from prior encounter (inform family/care partners about these prior to discharge). Breztri was his pta COPD inhaler, benicar PTA These meds were changed due to formulary. Evaluate the need to restart at [facility] discharge.  Other            Clinically significant medication issues were identified that warrant physician communication and completion of prescribed/recommended actions by midnight of the next day:  No   Name of provider notified for urgent issues identified:    Provider Method of Notification:      Pharmacist comments:    Time spent performing this drug regimen review (minutes):  20 minutes  Ruben Im, PharmD Clinical Pharmacist 09/11/2023 1:46 PM Please check AMION for all [facility] Pharm[facility] numbers

## 2023-09-11 NOTE — Progress Notes (Signed)
PROGRESS NOTE    Curtis Hartman  XBM:[id number] DOB: [birth year] DOA: 09/01/2023 PCP: Wilmon Pali, FNP    Brief Narrative:   Curtis Hartman is a 62 y.o. male with past medical history significant for COPD, HTN, type 2 diabetes mellitus, CKD stage IV, lumbar spinal stenosis with radiculopathy admitted on 10/29 by neurosurgery who underwent L2-5 DLIF, percutaneous instrumentation by Dr. Maisie Fus on 09/01/2023.  Hospital course complicated by acute renal failure on CKD stage IV; hospitalist team consulted on 10/31 for assistance with medical management.  Nephrology consulted on 10/31.  Assessment & Plan:   Acute renal failure on CKD stage IV Etiology likely multifactorial with contrast nephropathy, ischemic ATN, ABLA with con commitment ARB therapy, acute urinary retention.  Renal ultrasound with no hydronephrosis or nephrolithiasis, increased renal parenchymal echogenicity seen with medical renal disease. -- Nephrology following, appreciate assistance -- ARB discontinued -- Cr 4.84>>5.19>5.03>5.11>5.27>4.80 -- Strict I's and O's, 2.0L out past 24h -- Avoid nephrotoxins, renally dose all medications -- continue Foley catheter -- BMP daily -- Vascular surgery consulted for vascular access, LUE AVF planned today with Dr. 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  -- Hgb  9.2>>7.6>7.0>7.8>7.9 -- transfused 1u pRBC 11/7 -- CBC daily  Hypertensive emergency, resolved -- Amlodipine 10 mg p.o. daily -- Carvedilol 0.125 mg p.o. twice daily  Lumbar spinal stenosis with radiculopathy Initially admitted on 09/01/2023 by neurosurgery and underwent L2-5 TLIF with percutaneous administration by Dr. Maisie Fus. -- Further per neurosurgery  COPD -- Breo Ellipta 1 puff daily -- Incruse Ellipta 1 puff daily  Type 2 diabetes mellitus Home regimen includes Lantus 5 units daily nightly as needed for blood sugar over 150.  A1c 5.7 on 08/26/2023,  well-controlled.  BPH -- Tamsulosin 0.4 mg p.o. daily   DVT prophylaxis: heparin injection 5,000 Units Start: 09/04/23 1715 SCD's Start: 09/01/23 1935    Code Status: Full Code Family Communication: No family present at bedside  Disposition Plan:  Level of care: Med-Surg Status is: Inpatient Remains inpatient appropriate because: Per primary, pending CIR     Subjective: Patient seen examined bedside, lying in bed.  Underwent LUE aVF by vascular surgery yesterday.  Continues to complain of generalized weakness, back pain.  Awaiting insurance authorization for discharge to CIR.  Denies headache, no visual changes, no chest pain, no shortness of breath, no abdominal pain, no fever/chills/night sweats, no nausea/vomiting/diarrhea, no focal weakness, no fatigue, no paresthesias.  No acute events overnight per nursing.  Objective: Vitals:   09/10/23 2307 09/11/23 0444 09/11/23 0735 09/11/23 0752  BP: (!) 140/78 (!) 140/78 (!) 111/58   Pulse: 81 81 84   Resp: 17 18 18    Temp: 98.4 F (36.9 C)  98.8 F (37.1 C)   TempSrc: Oral  Oral   SpO2: 98% 95% 98% 97%  Weight:      Height:        Intake/Output Summary (Last 24 hours) at 09/11/2023 0845 Last data filed at 09/11/2023 0549 Gross per 24 hour  Intake 720 ml  Output 2010 ml  Net -1290 ml   Filed Weights   09/01/23 0819 09/10/23 1110  Weight: 71.2 kg 71.2 kg    Examination:  Physical Exam: GEN: NAD, alert and oriented x 3, chronically ill appearance HEENT: NCAT, PERRL, EOMI, sclera clear, MMM, poor dentition PULM: CTAB w/o wheezes/crackles, normal respiratory effort on room air CV: RRR w/o M/G/R GI: abd soft, NTND, NABS, no R/G/M MSK: no peripheral edema, moves  all extremities independently NEURO: No focal neurological deficits PSYCH: normal mood/affect Integumentary: LUE aVF surgical site noted, no concerning rashes/lesions/wounds noted on exposed skin surfaces    Data Reviewed: I have personally reviewed  following labs and imaging studies  CBC: Recent Labs  Lab 09/09/23 2038 09/10/23 0749 09/10/23 1057 09/11/23 0612  WBC 10.9* 8.9  --  10.6*  HGB 7.6* 7.0* 7.8* 7.9*  HCT 23.6* 21.9* 23.0* 24.4*  MCV 90.8 90.9  --  90.7  PLT 327 307  --  317   Basic Metabolic Panel: Recent Labs  Lab 09/07/23 0751 09/08/23 0634 09/09/23 0554 09/10/23 0749 09/10/23 1057 09/11/23 0612  NA 139 136 137 139 139 139  K 4.0 4.6 4.9 4.7 4.8 5.3*  CL 110 107 107 105 104 107  CO2 19* 21* 22 24  --  22  GLUCOSE 121* 115* 131* 105* 99 106*  BUN 81* 83* 92* 89* 92* 85*  CREATININE 5.03* 5.11* 5.27* 4.82* 4.80* 5.07*  CALCIUM 8.0* 8.1* 8.0* 8.3*  --  7.7*  PHOS  --   --   --  5.4*  --  5.6*   GFR: Estimated Creatinine Clearance: 14.9 mL/min (A) (by C-G formula based on SCr of 5.07 mg/dL (H)). Liver Function Tests: Recent Labs  Lab 09/10/23 0749 09/11/23 0612  ALBUMIN 2.2* 2.2*   No results for input(s): "LIPASE", "AMYLASE" in the last 168 hours. No results for input(s): "AMMONIA" in the last 168 hours. Coagulation Profile: No results for input(s): "INR", "PROTIME" in the last 168 hours. Cardiac Enzymes: No results for input(s): "CKTOTAL", "CKMB", "CKMBINDEX", "TROPONINI" in the last 168 hours. BNP (last 3 results) No results for input(s): "PROBNP" in the last 8760 hours. HbA1C: No results for input(s): "HGBA1C" in the last 72 hours. CBG: Recent Labs  Lab 09/10/23 0730 09/10/23 1251 09/10/23 1656 09/10/23 2015 09/11/23 0733  GLUCAP 110* 79 96 144* 132*   Lipid Profile: No results for input(s): "CHOL", "HDL", "LDLCALC", "TRIG", "CHOLHDL", "LDLDIRECT" in the last 72 hours. Thyroid Function Tests: No results for input(s): "TSH", "T4TOTAL", "FREET4", "T3FREE", "THYROIDAB" in the last 72 hours. Anemia Panel: Recent Labs    09/10/23 0749  TIBC 195*  IRON 45   Sepsis Labs: No results for input(s): "PROCALCITON", "LATICACIDVEN" in the last 168 hours.  No results found for this or  any previous visit (from the past 240 hour(s)).       Radiology Studies: No results found.      Scheduled Meds:  amLODipine  10 mg Oral Daily   calcitRIOL  0.25 mcg Oral Once per day on Monday Wednesday Friday   calcium acetate  667 mg Oral BID WC   carvedilol  3.125 mg Oral BID WC   Chlorhexidine Gluconate Cloth  6 each Topical Daily   darbepoetin (ARANESP) injection - NON-DIALYSIS  60 mcg Subcutaneous Q Wed-1800   docusate sodium  100 mg Oral BID   fluticasone furoate-vilanterol  1 puff Inhalation Daily   And   umeclidinium bromide  1 puff Inhalation Daily   gabapentin  400 mg Oral BID   heparin injection (subcutaneous)  5,000 Units Subcutaneous Q8H   insulin aspart  0-15 Units Subcutaneous TID WC   insulin aspart  0-5 Units Subcutaneous QHS   sodium bicarbonate  650 mg Oral TID   sodium chloride flush  3 mL Intravenous Q12H   tamsulosin  0.4 mg Oral Daily   Continuous Infusions:     LOS: 10 days    Time spent:  46 minutes spent on chart review, discussion with nursing staff, consultants, updating family and interview/physical exam; more than 50% of that time was spent in counseling and/or coordination of care.    Alvira Philips [place], DO [facility] Available via Epic secure chat 7am-7pm After these hours, please refer to coverage provider listed on [email or web address] 09/11/2023, 8:45 AM

## 2023-09-11 NOTE — Anesthesia Postprocedure Evaluation (Signed)
Anesthesia Post Note  Patient: Curtis Hartman  Procedure(s) Performed: LEFT ARM ARTERIOVENOUS (AV) FISTULA CREATION (Left: Arm Upper)     Patient location during evaluation: PACU Anesthesia Type: Regional and MAC Level of consciousness: awake and alert Pain management: pain level controlled Vital Signs Assessment: post-procedure vital signs reviewed and stable Respiratory status: spontaneous breathing, nonlabored ventilation, respiratory function stable and patient connected to nasal cannula oxygen Cardiovascular status: stable and blood pressure returned to baseline Postop Assessment: no apparent nausea or vomiting Anesthetic complications: no   No notable events documented.               [place]

## 2023-09-11 NOTE — Progress Notes (Signed)
Curtis Sheriff, DO  Physician Physical Medicine and Rehabilitation   Consult Note     Signed   Date of Service: 09/08/2023 12:40 PM  Related encounter: Admission (Discharged) from 09/01/2023 in [facility]   Signed     Expand All Collapse All           Physical Medicine and Rehabilitation Consult Reason for Consult: Evaluate appropriateness for Inpatient Rehab Referring Physician: Dr. Hoyt Koch     HPI: Curtis Hartman is a 62 y.o. male with PMHx of  has a past medical history of Arthritis, CKD (chronic kidney disease), COPD (chronic obstructive pulmonary disease) (HCC), Diabetes mellitus without complication (HCC), GERD (gastroesophageal reflux disease), Headache, Hepatitis, Hypertension, and Neuropathy. . They were admitted to [facility] on 09/01/2023 for planned intervention for severe lumbar stenosis, with relevant history of prior C3-7 PCDF.  On 10-29 he underwent L2-5 DLIF and instrumentation with Dr. Maisie Fus.  Postop course was complicated by hypertensive emergency, AKI on CKD stage IV, urinary retention with need for Foley catheter, uncontrolled diabetes, and poor pain control. PM&R was consulted to evaluate appropriateness for IPR admission.    Patient lives in a single-story home with approximately 2 steps to enter, without a railing.  He lives with his younger sister and her boyfriend, who are willing to provide 24/7 care and can provide some physical assistance.  He uses a rollator for ambulation at baseline.     Review of Systems  Constitutional:  Negative for chills and fever.  Respiratory:  Negative for cough and shortness of breath.   Cardiovascular:  Negative for chest pain and palpitations.  Gastrointestinal:  Negative for abdominal pain, constipation, heartburn, nausea and vomiting.  Genitourinary:  Negative for dysuria.       Retention  Musculoskeletal:  Positive for back pain. Negative for neck pain.        Right biceps pain, left shoulder pain  Neurological:  Positive for sensory change and weakness. Negative for dizziness and headaches.  Psychiatric/Behavioral:  Negative for depression. The patient does not have insomnia.         Past Medical History:  Diagnosis Date   Arthritis     CKD (chronic kidney disease)     COPD (chronic obstructive pulmonary disease) (HCC)     Diabetes mellitus without complication (HCC)      type 2   GERD (gastroesophageal reflux disease)     Headache      Hx Migraines   Hepatitis     Hypertension     Neuropathy               Past Surgical History:  Procedure Laterality Date   ANTERIOR CERVICAL DECOMP/DISCECTOMY FUSION N/A 11/05/2022    Procedure: Anterior Cervical Decompression/discectomy Fusion Cervical Four-Cervical Five;  Surgeon: Bedelia Person, MD;  Location: [facility] OR;  Service: Neurosurgery;  Laterality: N/A;  3C   ANTERIOR LAT LUMBAR FUSION N/A 09/01/2023    Procedure: DIRECT LUMBAR INTERBODY FUSION, LUMBAR TWO-LUMBAR THREE, LUMBAR THREE-LUMBAR FOUR, LUMBAR FOUR-LUMBAR FIVE, RIGHT PRONE TRANSPSOAS, LUMBAR TWO LUMBAR FIVE POSTERIOR PERCUTANEOUS FUSION LUMBAR FOUR-LUMBAR FIVE MINIMALLY INVASIVE LAMINECTOMY, FORAMINOTOMY;  Surgeon: Bedelia Person, MD;  Location: [facility] OR;  Service: Neurosurgery;  Laterality: N/A;   BACK SURGERY   2023    Cervical Spine   BIOPSY   03/03/2022    Procedure: BIOPSY;  Surgeon: Lanelle Bal, DO;  Location: [facility] SUITE;  Service: Endoscopy;;   COLONOSCOPY  WITH PROPOFOL N/A 03/03/2022    Procedure: COLONOSCOPY WITH PROPOFOL;  Surgeon: Lanelle Bal, DO;  Location: [facility];  Service: Endoscopy;  Laterality: N/A;  8:00am   ESOPHAGOGASTRODUODENOSCOPY (EGD) WITH PROPOFOL N/A 03/03/2022    Procedure: ESOPHAGOGASTRODUODENOSCOPY (EGD) WITH PROPOFOL;  Surgeon: Lanelle Bal, DO;  Location: [facility];  Service: Endoscopy;  Laterality: N/A;   foot right partial toe amputation Right     FOOT SURGERY  Right      x3   hernia surgery x4       LUMBAR PERCUTANEOUS PEDICLE SCREW 3 LEVEL N/A 09/01/2023    Procedure: L2-5 LUMBAR PERCUTANEOUS PEDICLE SCREW 3 LEVEL;  Surgeon: Bedelia Person, MD;  Location: [facility] OR;  Service: Neurosurgery;  Laterality: N/A;   POLYPECTOMY   03/03/2022    Procedure: POLYPECTOMY;  Surgeon: Lanelle Bal, DO;  Location: [facility];  Service: Endoscopy;;             Family History  Problem Relation Age of Onset   Heart disease Mother     Seizures Father     Stroke Maternal Grandfather     Liver disease Paternal Grandmother          Social History:  reports that he has been smoking cigarettes. He has never been exposed to tobacco smoke. He has never used smokeless tobacco. He reports that he does not currently use alcohol. He reports that he does not currently use drugs. Allergies:  Allergies  No Active Allergies         Medications Prior to Admission  Medication Sig Dispense Refill   amLODipine (NORVASC) 10 MG tablet Take 10 mg by mouth daily as needed (diastolic blood pressure above 90).       calcitRIOL (ROCALTROL) 0.25 MCG capsule Take 0.25 mcg by mouth 3 (three) times a week.       calcium acetate (PHOSLO) 667 MG tablet Take 667 mg by mouth 2 (two) times daily.       fluticasone (FLONASE) 50 MCG/ACT nasal spray Place 2 sprays into both nostrils daily as needed for allergies (congestion).       gabapentin (NEURONTIN) 400 MG capsule Take 400 mg by mouth 2 (two) times daily.       insulin aspart (NOVOLOG) 100 UNIT/ML injection Inject 2-10 Units into the skin 3 (three) times daily as needed (blood sugar over 150).       LANTUS 100 UNIT/ML injection Inject 5 Units into the skin at bedtime as needed (blood sugar over 150).       morphine (MSIR) 15 MG tablet Take 15 mg by mouth every 6 (six) hours as needed for severe pain.       olmesartan (BENICAR) 5 MG tablet Take 5 mg by mouth daily as needed (diastolic blood pressure above 90).       Wheat Dextrin  (BENEFIBER) POWD Mix 2 - 3 teaspoons in 8 ounces of water daily in the mornings. 475 g 0   albuterol (PROAIR HFA) 108 (90 Base) MCG/ACT inhaler 2 puffs every 4 hours as needed only  if your can't catch your breath 18 g 11   azithromycin (ZITHROMAX) 250 MG tablet Take 2 on day one then 1 daily x 4 days (Patient not taking: Reported on 08/19/2023) 6 tablet 0   Budeson-Glycopyrrol-Formoterol (BREZTRI AEROSPHERE) 160-9-4.8 MCG/ACT AERO Inhale 2 puffs into the lungs in the morning and at bedtime. (Patient taking differently: Inhale 2 puffs into the lungs 2 (two) times daily  as needed (shortness of breath).)       LOKELMA 10 g PACK packet Take 10 g by mouth daily as needed (high potassium).              Home: Home Living Family/patient expects to be discharged to:: Private residence Living Arrangements: Other relatives (sister and her boyfriend) Available Help at Discharge: Family, Available 24 hours/day Type of Home: House Home Access: Stairs to enter Secretary/administrator of Steps: 2 Home Layout: One level Bathroom Shower/Tub: Engineer, manufacturing systems: Standard Home Equipment: Rollator (4 wheels), BSC/3in1, Adaptive equipment, Shower seat, Tub bench Adaptive Equipment: Reacher  Functional History: Prior Function Prior Level of Function : Independent/Modified Independent Mobility Comments: uses rollator outside, and generally no AD in the home as he furniture walks ADLs Comments: pt managing ADLs and light IADls Functional Status:  Mobility: Bed Mobility Overal bed mobility: Needs Assistance Bed Mobility: Sidelying to Sit, Rolling Rolling: Supervision Sidelying to sit: Supervision Sit to sidelying: Mod assist General bed mobility comments: pt OOB in recliner at start and end of session Transfers Overall transfer level: Needs assistance Equipment used: Rolling walker (2 wheels) Transfers: Sit to/from Stand Sit to Stand: Min assist Bed to/from chair/wheelchair/BSC transfer  type:: Step pivot Step pivot transfers: Min assist General transfer comment: cueing for posture and technique, min assist from recliner. Ambulation/Gait Ambulation/Gait assistance: Min assist Gait Distance (Feet): 55 Feet (+ 25 ft) Assistive device: Rolling walker (2 wheels) Gait Pattern/deviations: Step-through pattern, Decreased stride length, Trunk flexed, Decreased dorsiflexion - right, Decreased dorsiflexion - left, Knee flexed in stance - right, Knee flexed in stance - left General Gait Details: pt with slow gait and increased ankle strategies with each step. dependent on UE support and poor acceptance of cues for posture and positioning in RW. pt reluctant to accept cues through session. poor activity tolerance and poor insight to limited activity tolerance. assist for balance, especially when turning Gait velocity: decreased Gait velocity interpretation: <1.31 ft/sec, indicative of household ambulator   ADL: ADL Overall ADL's : Needs assistance/impaired Grooming: Set up, Sitting Upper Body Dressing : Minimal assistance, Sitting Upper Body Dressing Details (indicate cue type and reason): brace mgmt Lower Body Dressing: Sit to/from stand, Moderate assistance Lower Body Dressing Details (indicate cue type and reason): educated on sock aide and reacher for LB dressing, min assist to don/doff socks and increased time. would benefit from further training Toilet Transfer: Minimal assistance, Contact guard assist, Ambulation, Rolling walker (2 wheels) Toilet Transfer Details (indicate cue type and reason): from EOB min A, min guard from recliner with B arm rests Functional mobility during ADLs: Contact guard assist, Rolling walker (2 wheels), Cueing for safety   Cognition: Cognition Overall Cognitive Status: Impaired/Different from baseline Orientation Level: Oriented X4 Cognition Arousal: Alert Behavior During Therapy: WFL for tasks assessed/performed Overall Cognitive Status:  Impaired/Different from baseline Area of Impairment: Following commands, Safety/judgement, Awareness, Problem solving Following Commands: Follows one step commands inconsistently Safety/Judgement: Decreased awareness of safety, Decreased awareness of deficits Problem Solving: Difficulty sequencing, Requires verbal cues General Comments: pt needing increased cues for technique and safety. poor problem solving and understanding of PT education.   Blood pressure 138/82, pulse 86, temperature 99.9 F (37.7 C), temperature source Oral, resp. rate 17, height 5\' 9"  (1.753 m), weight 71.2 kg, SpO2 98%. Physical Exam   Constitutional: No apparent distress. Appropriate appearance for age.  HENT: No JVD. Neck Supple. Trachea midline. Atraumatic, normocephalic. Eyes: PERRLA. EOMI. Visual fields grossly intact. +glasses Cardiovascular: RRR, no  murmurs/rub/gallops. No Edema. Peripheral pulses 2+  Respiratory: CTAB. No rales, rhonchi, or wheezing. On RA.  Abdomen: + bowel sounds, normoactive. No distention or tenderness.  GU: Not examined. +Foley, draining clear urine.  Skin: C/D/I. No apparent lesions. IV intact. Surgical site covered.  + Sutures on right distal foot, covered in overlying scab, no apparent drainage   MSK: + Popeye deformity of right biceps, with TTP muscle belly + Bilateral thenar eminence wasting + Active range of motion left shoulder limited to< 90; + drop arm; PROM limited to 110 degrees + Right 1-2 toe amputations        Neurologic exam:  Cognition: AAO to person, place, time and event.  Language: Fluent, No substitutions or neoglisms. No dysarthria. Names 3/3 objects correctly.  Memory: Recalls 3/3 objects at 5 minutes. No apparent deficits  Insight: Good insight into current condition.  Mood: Pleasant affect, appropriate mood.  Sensation: To light touch intact in BL UEs and LEs  Reflexes: 1+ in BL UE and LEs. Negative Hoffman's and babinski signs bilaterally.  CN: 2-12  grossly intact.  Coordination: Mild bilateral upper extremity intention tremors. No ataxia on FTN  Spasticity: MAS 0 in all extremities.  Strength: Bilateral upper extremities 4 out of 5 proximally, 3 out of 5 distally Left lower extremity 3+/5 hip flexion, 5 -/5 distal muscles Right lower extremity 3 -/5 hip flexion, 4+/5 distal muscles         Lab Results Last 24 Hours       Results for orders placed or performed during the hospital encounter of 09/01/23 (from the past 24 hour(s))  Glucose, capillary     Status: Abnormal    Collection Time: 09/07/23  4:11 PM  Result Value Ref Range    Glucose-Capillary 107 (H) 70 - 99 mg/dL  Glucose, capillary     Status: Abnormal    Collection Time: 09/07/23  9:30 PM  Result Value Ref Range    Glucose-Capillary 170 (H) 70 - 99 mg/dL  Basic metabolic panel     Status: Abnormal    Collection Time: 09/08/23  6:34 AM  Result Value Ref Range    Sodium 136 135 - 145 mmol/L    Potassium 4.6 3.5 - 5.1 mmol/L    Chloride 107 98 - 111 mmol/L    CO2 21 (L) 22 - 32 mmol/L    Glucose, Bld 115 (H) 70 - 99 mg/dL    BUN 83 (H) 8 - 23 mg/dL    Creatinine, Ser 4.25 (H) 0.61 - 1.24 mg/dL    Calcium 8.1 (L) 8.9 - 10.3 mg/dL    GFR, Estimated 12 (L) >60 mL/min    Anion gap 8 5 - 15  Glucose, capillary     Status: Abnormal    Collection Time: 09/08/23  7:24 AM  Result Value Ref Range    Glucose-Capillary 119 (H) 70 - 99 mg/dL  Glucose, capillary     Status: Abnormal    Collection Time: 09/08/23 11:32 AM  Result Value Ref Range    Glucose-Capillary 134 (H) 70 - 99 mg/dL      Imaging Results (Last 48 hours)  No results found.     Assessment/Plan: Diagnosis: Lumbar stenosis status post  L2-5 DLIF Does the need for close, 24 hr/day medical supervision in concert with the patient's rehab needs make it unreasonable for this patient to be served in a less intensive setting? Yes Co-Morbidities requiring supervision/potential complications: hypertensive  emergency, AKI on CKD stage IV, urinary retention  with need for Foley catheter, uncontrolled diabetes, right toe wound, and poor pain control. Due to bladder management, safety, skin/wound care, disease management, medication administration, pain management, and patient education, does the patient require 24 hr/day rehab nursing? Yes Does the patient require coordinated care of a physician, rehab nurse, therapy disciplines of PT and OT  to address physical and functional deficits in the context of the above medical diagnosis(es)? Yes Addressing deficits in the following areas: balance, endurance, locomotion, strength, transferring, bowel/bladder control, bathing, dressing, feeding, grooming, and toileting Can the patient actively participate in an intensive therapy program of at least 3 hrs of therapy per day at least 5 days per week? Yes The potential for patient to make measurable gains while on inpatient rehab is excellent Anticipated functional outcomes upon discharge from inpatient rehab are supervision  with PT, supervision with OT. Estimated rehab length of stay to reach the above functional goals is: 10-14 days Anticipated discharge destination: Home Overall Rehab/Functional Prognosis: excellent   POST ACUTE RECOMMENDATIONS: This patient's condition is appropriate for continued rehabilitative care in the following setting: CIR Patient has agreed to participate in recommended program. Yes Note that insurance prior authorization may be required for reimbursement for recommended care.   Comment: This patient is an excellent candidate for inpatient rehab; he has good family supports with a safe dispo plan and 24/7 supervision at discharge.  He remains with significant weakness in his bilateral lower extremities, along with medical complications requiring physician oversight including AKI on CKD, hypertension, urinary retention, and poor pain control.  He is motivated to participate in inpatient  rehab and has excellent potential for functional gains to assist in discharge home.     MEDICAL RECOMMENDATIONS: Recommend MSK ultrasound or MRI of right upper extremity to evaluate for possible biceps tear, as this is a significant source of pain and functional limitations for the patient Recommend discontinuation of Foley trial when medically appropriate Recommend removal of sutures on right foot, which per the patient were placed 10/27 for laceration injury.     I have personally performed a face to face diagnostic evaluation of this patient. Additionally, I have examined the patient's medical record including any pertinent labs and radiographic images. If the physician assistant has documented in this note, I have reviewed and edited or otherwise concur with the physician assistant's documentation.   Thanks,   Curtis Sheriff, DO 09/08/2023          Routing History

## 2023-09-11 NOTE — H&P (Incomplete)
Physical Medicine and Rehabilitation Admission H&P   CC: Functional deficits secondary to lumbar stenosis  HPI: Curtis Hartman is a 62 year old male with a history of C3-C7 PCD F who developed progressive mechanical back pain and bilateral radicular symptoms refractory to medical therapy, physical therapy and pain management.  He is followed by Dr. Hoyt Koch.  Discussed recommendations for L2-3, L3-4, L4-5 DLIF, posterior percutaneous fusion and possible MIS posterior decompression.  The patient consented and was taken to the operating room on 09/01/2023 and underwent direct lumbar interbody fusion L2-3, L3-4, L4-5, right prone Tran psoas approach, L2-L5 posterior percutaneous fusion and L4-L5 minimally invasive laminectomy and foraminotomies.  He also performed L2-5 lumbar percutaneous pedicle screw.  Stable postoperatively with recommendations to apply LSO when out of bed.  He had acute postoperative hypertensive urgency, AKI on CKD, urinary retention and Foley was placed.  Likely benign prostatic hypertrophy and placed on Flomax. TRH consulted for chronic kidney disease and elevated creatinine.  Follows as an outpatient with Dr. Wolfgang Phoenix.  Losartan discontinued and kidney ultrasound and urine studies ordered.  Nephrology consulted.  No uremic symptoms or indications for dialysis, however he is nearing need for hemodialysis and vascular surgery was consulted for creation of AV fistula.  On subcutaneous heparin for DVT prophylaxis.  Dr. Maurice Small discussed with him on 11/02 that his psoas weakness is expected and approach related and should improve with time.  On 11/07 he was taken to the operating room where he underwent left brachiocephalic arteriovenous fistula by Dr. Hetty Blend.  Was transfused 1 unit of packed red blood cells for hemoglobin of 7.0.  Currently receiving Venofer.The patient requires inpatient medicine and rehabilitation evaluations and services for ongoing dysfunction secondary to  stenosis.  The patient lives in a single-story home in [place] with proxy 2 steps to enter, without a railing.  He lives with his younger sister and her boyfriend.  He used a rollator for ambulation at baseline.   Review of record: patient seen at [facility] ED in [place] on 08/30/2023 for laceration of right 4th toe. Referred to orthopedics for evaluation of possible foreign body and suture removal (7-10 days). Denies pain.    ROS Past Medical History:  Diagnosis Date   Arthritis    CKD (chronic kidney disease)    COPD (chronic obstructive pulmonary disease) (HCC)    Diabetes mellitus without complication (HCC)    type 2   GERD (gastroesophageal reflux disease)    Headache    Hx Migraines   Hepatitis    Hypertension    Neuropathy    Past Surgical History:  Procedure Laterality Date   ANTERIOR CERVICAL DECOMP/DISCECTOMY FUSION N/A 11/05/2022   Procedure: Anterior Cervical Decompression/discectomy Fusion Cervical Four-Cervical Five;  Surgeon: Bedelia Person, MD;  Location: [facility] OR;  Service: Neurosurgery;  Laterality: N/A;  3C   ANTERIOR LAT LUMBAR FUSION N/A 09/01/2023   Procedure: DIRECT LUMBAR INTERBODY FUSION, LUMBAR TWO-LUMBAR THREE, LUMBAR THREE-LUMBAR FOUR, LUMBAR FOUR-LUMBAR FIVE, RIGHT PRONE TRANSPSOAS, LUMBAR TWO LUMBAR FIVE POSTERIOR PERCUTANEOUS FUSION LUMBAR FOUR-LUMBAR FIVE MINIMALLY INVASIVE LAMINECTOMY, FORAMINOTOMY;  Surgeon: Bedelia Person, MD;  Location: [facility] OR;  Service: Neurosurgery;  Laterality: N/A;   AV FISTULA PLACEMENT Left 09/10/2023   Procedure: LEFT ARM ARTERIOVENOUS (AV) FISTULA CREATION;  Surgeon: Daria Pastures, MD;  Location: [facility] OR;  Service: Vascular;  Laterality: Left;   BACK SURGERY  2023   Cervical Spine   BIOPSY  03/03/2022   Procedure: BIOPSY;  Surgeon: Earnest Bailey  K, DO;  Location: [facility] SUITE;  Service: Endoscopy;;   COLONOSCOPY WITH PROPOFOL N/A 03/03/2022   Procedure: COLONOSCOPY WITH PROPOFOL;  Surgeon: Lanelle Bal, DO;  Location: [facility];  Service: Endoscopy;  Laterality: N/A;  8:00am   ESOPHAGOGASTRODUODENOSCOPY (EGD) WITH PROPOFOL N/A 03/03/2022   Procedure: ESOPHAGOGASTRODUODENOSCOPY (EGD) WITH PROPOFOL;  Surgeon: Lanelle Bal, DO;  [facility];  Service: Endoscopy;  Laterality: N/A;   foot right partial toe amputation Right    FOOT SURGERY Right    x3   hernia surgery x4     LUMBAR PERCUTANEOUS PEDICLE SCREW 3 LEVEL N/A 09/01/2023   Procedure: L2-5 LUMBAR PERCUTANEOUS PEDICLE SCREW 3 LEVEL;  Surgeon: Bedelia Person, MD;  Location: [facility] OR;  Service: Neurosurgery;  Laterality: N/A;   POLYPECTOMY  03/03/2022   Procedure: POLYPECTOMY;  Surgeon: Lanelle Bal, DO;  [facility]: [facility];  Service: Endoscopy;;   Family History  Problem Relation Age of Onset   Heart disease Mother    Seizures Father    Stroke Maternal Grandfather    Liver disease Paternal Grandmother    Social History:  reports that he has been smoking cigarettes. He has never been exposed to tobacco smoke. He has never used smokeless tobacco. He reports that he does not currently use alcohol. He reports that he does not currently use drugs. Allergies: No Active Allergies Medications Prior to Admission  Medication Sig Dispense Refill   amLODipine (NORVASC) 10 MG tablet Take 10 mg by mouth daily as needed (diastolic blood pressure above 90).     calcitRIOL (ROCALTROL) 0.25 MCG capsule Take 0.25 mcg by mouth 3 (three) times a week.     calcium acetate (PHOSLO) 667 MG tablet Take 667 mg by mouth 2 (two) times daily.     fluticasone (FLONASE) 50 MCG/ACT nasal spray Place 2 sprays into both nostrils daily as needed for allergies (congestion).     gabapentin (NEURONTIN) 400 MG capsule Take 400 mg by mouth 2 (two) times daily.     insulin aspart (NOVOLOG) 100 UNIT/ML injection Inject 2-10 Units into the skin 3 (three) times daily as needed (blood sugar over 150).     LANTUS 100 UNIT/ML injection  Inject 5 Units into the skin at bedtime as needed (blood sugar over 150).     morphine (MSIR) 15 MG tablet Take 15 mg by mouth every 6 (six) hours as needed for severe pain.     olmesartan (BENICAR) 5 MG tablet Take 5 mg by mouth daily as needed (diastolic blood pressure above 90).     Wheat Dextrin (BENEFIBER) POWD Mix 2 - 3 teaspoons in 8 ounces of water daily in the mornings. 475 g 0   albuterol (PROAIR HFA) 108 (90 Base) MCG/ACT inhaler 2 puffs every 4 hours as needed only  if your can't catch your breath 18 g 11   azithromycin (ZITHROMAX) 250 MG tablet Take 2 on day one then 1 daily x 4 days (Patient not taking: Reported on 08/19/2023) 6 tablet 0   Budeson-Glycopyrrol-Formoterol (BREZTRI AEROSPHERE) 160-9-4.8 MCG/ACT AERO Inhale 2 puffs into the lungs in the morning and at bedtime. (Patient taking differently: Inhale 2 puffs into the lungs 2 (two) times daily as needed (shortness of breath).)     LOKELMA 10 g PACK packet Take 10 g by mouth daily as needed (high potassium).        Home: Home Living Family/patient expects to be discharged to:: Private residence Living Arrangements: Other  relatives (sister and her boyfriend) Available Help at Discharge: Family, Available 24 hours/day Type of Home: House Home Access: Stairs to enter [employer] of Steps: 2 Home Layout: One level Bathroom Shower/Tub: Engineer, manufacturing systems: Standard Home Equipment: Rollator (4 wheels), BSC/3in1, Adaptive equipment, Shower seat, Tub bench Adaptive Equipment: Reacher   Functional History: Prior Function Prior Level of Function : Independent/Modified Independent Mobility Comments: uses rollator outside, and generally no AD in the home as he furniture walks ADLs Comments: pt managing ADLs and light IADls  Functional Status:  Mobility: Bed Mobility Overal bed mobility: Needs Assistance Bed Mobility: Sidelying to Sit, Rolling Rolling: Supervision Sidelying to sit: Supervision Sit  to sidelying: Mod assist General bed mobility comments: OOB on BSC upon entry Transfers Overall transfer level: Needs assistance Equipment used: Rolling walker (2 wheels) Transfers: Sit to/from Stand Sit to Stand: Min assist Bed to/from chair/wheelchair/BSC transfer type:: Step pivot Step pivot transfers: Min assist General transfer comment: Cues for safe BUE placement and posture; STS x 2 from [facility], slow to initiate, pt with loose stools upon standing so had to sit back down to continue BM Ambulation/Gait Ambulation/Gait assistance: Min assist Gait Distance (Feet): 55 Feet (+ 25 ft) Assistive device: Rolling walker (2 wheels) Gait Pattern/deviations: Step-through pattern, Decreased stride length, Trunk flexed, Decreased dorsiflexion - right, Decreased dorsiflexion - left, Knee flexed in stance - right, Knee flexed in stance - left General Gait Details: Defer, pt with diarrhea each time standing and not safe to progress gait at time of session. Gait velocity: decreased Gait velocity interpretation: <1.31 ft/sec, indicative of household ambulator    ADL: ADL Overall ADL's : Needs assistance/impaired Grooming: Set up, Sitting Upper Body Dressing : Minimal assistance, Sitting Upper Body Dressing Details (indicate cue type and reason): brace Lower Body Dressing: Minimal assistance, Sit to/from stand, With adaptive equipment Lower Body Dressing Details (indicate cue type and reason): used AE, min assist to stand and support to managing clothing over hips Toilet Transfer: Minimal assistance, BSC/3in1, Rolling walker (2 wheels) Toilet Transfer Details (indicate cue type and reason): from EOB min A, min guard from recliner with B arm rests Toileting- Clothing Manipulation and Hygiene: Moderate assistance, Sit to/from stand Toileting - Clothing Manipulation Details (indicate cue type and reason): hygiene, clothing mgmt Functional mobility during ADLs: Contact guard assist, Rolling walker (2  wheels), Cueing for safety  Cognition: Cognition Overall Cognitive Status: Impaired/Different from baseline Orientation Level: Oriented X4 Cognition Arousal: Alert Behavior During Therapy: WFL for tasks assessed/performed Overall Cognitive Status: Impaired/Different from baseline Area of Impairment: Awareness, Problem solving Following Commands: Follows one step commands inconsistently Safety/Judgement: Decreased awareness of safety, Decreased awareness of deficits Awareness: Emergent Problem Solving: Slow processing, Requires verbal cues General Comments: pt needing increased cues for technique and safety. poor problem solving and understanding of PT education.  Physical Exam: Blood pressure (!) 111/58, pulse 84, temperature 98.8 F (37.1 C), temperature source Oral, resp. rate 18, height 5\' 9"  (1.753 m), weight 71.2 kg, SpO2 97%. Physical Exam Constitutional:      General: He is not in acute distress. HENT:     Head: Normocephalic and atraumatic.  Cardiovascular:     Rate and Rhythm: Normal rate and regular rhythm.  Pulmonary:     Effort: Pulmonary effort is normal.     Breath sounds: Normal breath sounds.  Abdominal:     General: Bowel sounds are normal.     Palpations: Abdomen is soft.  Genitourinary:    Comments: Indwelling Foley catheter  Musculoskeletal:     Comments: #3 interrupted sutures of right 4th toe. Well-healed  Neurological:     Mental Status: He is alert and oriented to person, place, and time.     Results for orders placed or performed during the hospital encounter of 09/01/23 (from the past 48 hour(s))  Glucose, capillary     Status: Abnormal   Collection Time: 09/09/23 11:17 AM  Result Value Ref Range   Glucose-Capillary 115 (H) 70 - 99 mg/dL    Comment: Glucose reference range applies only to samples taken after fasting for at least 8 hours.  Glucose, capillary     Status: Abnormal   Collection Time: 09/09/23  4:20 PM  Result Value Ref Range    Glucose-Capillary 180 (H) 70 - 99 mg/dL    Comment: Glucose reference range applies only to samples taken after fasting for at least 8 hours.  Glucose, capillary     Status: None   Collection Time: 09/09/23  8:23 PM  Result Value Ref Range   Glucose-Capillary 97 70 - 99 mg/dL    Comment: Glucose reference range applies only to samples taken after fasting for at least 8 hours.  CBC     Status: Abnormal   Collection Time: 09/09/23  8:38 PM  Result Value Ref Range   WBC 10.9 (H) 4.0 - 10.5 K/uL   RBC 2.60 (L) 4.22 - 5.81 MIL/uL   Hemoglobin 7.6 (L) 13.0 - 17.0 g/dL   HCT 13.2 (L) 44.0 - 10.2 %   MCV 90.8 80.0 - 100.0 fL   MCH 29.2 26.0 - 34.0 pg   MCHC 32.2 30.0 - 36.0 g/dL   RDW 72.5 36.6 - 44.0 %   Platelets 327 150 - 400 K/uL   nRBC 0.0 0.0 - 0.2 %    Comment: Performed at [facility], [address], [place], [place] [address]  Glucose, capillary     Status: Abnormal   Collection Time: 09/10/23  7:30 AM  Result Value Ref Range   Glucose-Capillary 110 (H) 70 - 99 mg/dL    Comment: Glucose reference range applies only to samples taken after fasting for at least 8 hours.  Renal function panel     Status: Abnormal   Collection Time: 09/10/23  7:49 AM  Result Value Ref Range   Sodium 139 135 - 145 mmol/L   Potassium 4.7 3.5 - 5.1 mmol/L   Chloride 105 98 - 111 mmol/L   CO2 24 22 - 32 mmol/L   Glucose, Bld 105 (H) 70 - 99 mg/dL    Comment: Glucose reference range applies only to samples taken after fasting for at least 8 hours.   BUN 89 (H) 8 - 23 mg/dL   Creatinine, Ser 5.95 (H) 0.61 - 1.24 mg/dL   Calcium 8.3 (L) 8.9 - 10.3 mg/dL   Phosphorus 5.4 (H) 2.5 - 4.6 mg/dL   Albumin 2.2 (L) 3.5 - 5.0 g/dL   GFR, Estimated 13 (L) >60 mL/min    Comment: (NOTE) Calculated using the CKD-EPI Creatinine Equation (2021)    Anion gap 10 5 - 15    Comment: Performed at [facility], [address], [place], [place] [address]  Iron and TIBC     Status: Abnormal   Collection  Time: 09/10/23  7:49 AM  Result Value Ref Range   Iron 45 45 - 182 ug/dL   TIBC 643 (L) 329 - 518 ug/dL   Saturation Ratios 23 17.9 - 39.5 %  UIBC 150 ug/dL    Comment: Performed at [facility], [address], [place], [place] [address]  CBC     Status: Abnormal   Collection Time: 09/10/23  7:49 AM  Result Value Ref Range   WBC 8.9 4.0 - 10.5 K/uL   RBC 2.41 (L) 4.22 - 5.81 MIL/uL   Hemoglobin 7.0 (L) 13.0 - 17.0 g/dL   HCT 60.4 (L) 54.0 - 98.1 %   MCV 90.9 80.0 - 100.0 fL   MCH 29.0 26.0 - 34.0 pg   MCHC 32.0 30.0 - 36.0 g/dL   RDW 19.1 47.8 - 29.5 %   Platelets 307 150 - 400 K/uL   nRBC 0.0 0.0 - 0.2 %    Comment: Performed at [facility], [address] [place], [place] [address]  I-STAT, [place] 8     Status: Abnormal   Collection Time: 09/10/23 10:57 AM  Result Value Ref Range   Sodium 139 135 - 145 mmol/L   Potassium 4.8 3.5 - 5.1 mmol/L   Chloride 104 98 - 111 mmol/L   BUN 92 (H) 8 - 23 mg/dL   Creatinine, Ser 8.65 (H) 0.61 - 1.24 mg/dL   Glucose, Bld 99 70 - 99 mg/dL    Comment: Glucose reference range applies only to samples taken after fasting for at least 8 hours.   Calcium, Ion 1.17 1.15 - 1.40 mmol/L   TCO2 24 22 - 32 mmol/L   Hemoglobin 7.8 (L) 13.0 - 17.0 g/dL   HCT 78.4 (L) 69.6 - 29.5 %  Glucose, capillary     Status: None   Collection Time: 09/10/23 12:51 PM  Result Value Ref Range   Glucose-Capillary 79 70 - 99 mg/dL    Comment: Glucose reference range applies only to samples taken after fasting for at least 8 hours.   Comment 1 Notify RN   Type and screen [facility]     Status: None   Collection Time: 09/10/23  3:02 PM  Result Value Ref Range   ABO/RH(D) A POS    Antibody Screen NEG    Sample Expiration 09/13/2023,2359    Unit Number [id number]    Blood Component Type RED CELLS,LR    Unit division 00    Status of Unit ISSUED,FINAL    Transfusion Status OK TO TRANSFUSE    Crossmatch Result       Compatible Performed at [facility], [address], [place], [place] [address]   Prepare RBC (crossmatch)     Status: None   Collection Time: 09/10/23  3:05 PM  Result Value Ref Range   Order Confirmation      ORDER PROCESSED BY BLOOD BANK Performed at [facility], [address], [place], [place] [address]   Glucose, capillary     Status: None   Collection Time: 09/10/23  4:56 PM  Result Value Ref Range   Glucose-Capillary 96 70 - 99 mg/dL    Comment: Glucose reference range applies only to samples taken after fasting for at least 8 hours.  Glucose, capillary     Status: Abnormal   Collection Time: 09/10/23  8:15 PM  Result Value Ref Range   Glucose-Capillary 144 (H) 70 - 99 mg/dL    Comment: Glucose reference range applies only to samples taken after fasting for at least 8 hours.  Renal function panel     Status: Abnormal   Collection Time: 09/11/23  6:12 AM  Result Value  Ref Range   Sodium 139 135 - 145 mmol/L   Potassium 5.3 (H) 3.5 - 5.1 mmol/L   Chloride 107 98 - 111 mmol/L   CO2 22 22 - 32 mmol/L   Glucose, Bld 106 (H) 70 - 99 mg/dL    Comment: Glucose reference range applies only to samples taken after fasting for at least 8 hours.   BUN 85 (H) 8 - 23 mg/dL   Creatinine, Ser 5.78 (H) 0.61 - 1.24 mg/dL   Calcium 7.7 (L) 8.9 - 10.3 mg/dL   Phosphorus 5.6 (H) 2.5 - 4.6 mg/dL   Albumin 2.2 (L) 3.5 - 5.0 g/dL   GFR, Estimated 12 (L) >60 mL/min    Comment: (NOTE) Calculated using the CKD-EPI Creatinine Equation (2021)    Anion gap 10 5 - 15    Comment: Performed at [facility], [address] [place], [place] [address]  CBC     Status: Abnormal   Collection Time: 09/11/23  6:12 AM  Result Value Ref Range   WBC 10.6 (H) 4.0 - 10.5 K/uL   RBC 2.69 (L) 4.22 - 5.81 MIL/uL   Hemoglobin 7.9 (L) 13.0 - 17.0 g/dL   HCT 95.2 (L) 84.1 - 32.4 %   MCV 90.7 80.0 - 100.0 fL   MCH 29.4 26.0 - 34.0 pg   MCHC 32.4 30.0 - 36.0 g/dL   RDW 40.1 02.7 - 25.3 %    Platelets 317 150 - 400 K/uL   nRBC 0.0 0.0 - 0.2 %    Comment: Performed at [facility], [address] [place], [place] [address]  Glucose, capillary     Status: Abnormal   Collection Time: 09/11/23  7:33 AM  Result Value Ref Range   Glucose-Capillary 132 (H) 70 - 99 mg/dL    Comment: Glucose reference range applies only to samples taken after fasting for at least 8 hours.   Comment 1 Notify RN    Comment 2 Document in Chart    No results found.    Blood pressure (!) 111/58, pulse 84, temperature 98.8 F (37.1 C), temperature source Oral, resp. rate 18, height 5\' 9"  (1.753 m), weight 71.2 kg, SpO2 97%.  Medical Problem List and Plan: 1. Functional deficits secondary to ***  -patient may *** shower  -ELOS/Goals: ***  2.  Antithrombotics: -DVT/anticoagulation:  Pharmaceutical: Heparin  -antiplatelet therapy: none  3. Pain Management: Tylenol, Robaxin, oxycodone as needed  -Continue gabapentin 100 mg twice daily  4. Mood/Behavior/Sleep: LCSW to evaluate and provide emotional support  -antipsychotic agents: n/a  5. Neuropsych/cognition: This patient is capable of making decisions on his own behalf.  6. Skin/Wound Care: Routine skin care checks   7. Fluids/Electrolytes/Nutrition: Strict Is and Os and follow-up chemistries  8: Hypertension: monitor TID and prn; appears stable on oral agents  -continue amlodipine 10 mg daily  -continue carvedilol 3.125 mg twice daily  9: Diabetes mellitus: CBGs 4 times daily; A1c = 5.7%  -continue sliding scale insulin 0 to 15 units  -? 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 duration)  15: Loose stools/? IBS: getting Imodium prn  16: Right 4th toe laceration PTA: well-healed, discontinue sutures.     ***  Milinda Antis, PA-C 09/11/2023

## 2023-09-11 NOTE — Progress Notes (Signed)
  Progress Note    09/11/2023 7:52 AM 1 Day Post-Op  Subjective:  left arm feels a little sore    Vitals:   09/11/23 0444 09/11/23 0735  BP: (!) 140/78 (!) 111/58  Pulse: 81 84  Resp: 18 18  Temp:  98.8 F (37.1 C)  SpO2: 95% 98%    Physical Exam: General:  resting comfortably Lungs:  nonlabored Incisions:  LUE incision well appearing and dry Extremities:  palpable left radial pulse. Intact motor and sensation of left hand. Left BC fistula with good thrill   CBC    Component Value Date/Time   WBC 10.6 (H) 09/11/2023 0612   RBC 2.69 (L) 09/11/2023 0612   HGB 7.9 (L) 09/11/2023 0612   HCT 24.4 (L) 09/11/2023 0612   PLT 317 09/11/2023 0612   MCV 90.7 09/11/2023 0612   MCH 29.4 09/11/2023 0612   MCHC 32.4 09/11/2023 0612   RDW 13.6 09/11/2023 0612   LYMPHSABS 1,723 11/27/2022 1317   EOSABS 218 11/27/2022 1317   BASOSABS 61 11/27/2022 1317    BMET    Component Value Date/Time   NA 139 09/11/2023 0612   K 5.3 (H) 09/11/2023 0612   CL 107 09/11/2023 0612   CO2 22 09/11/2023 0612   GLUCOSE 106 (H) 09/11/2023 0612   BUN 85 (H) 09/11/2023 0612   CREATININE 5.07 (H) 09/11/2023 0612   CREATININE 2.78 (H) 11/27/2022 1317   CALCIUM 7.7 (L) 09/11/2023 0612   GFRNONAA 12 (L) 09/11/2023 0612    INR    Component Value Date/Time   INR 0.9 09/09/2021 0630     Intake/Output Summary (Last 24 hours) at 09/11/2023 0752 Last data filed at 09/11/2023 0549 Gross per 24 hour  Intake 720 ml  Output 2010 ml  Net -1290 ml      Assessment/Plan:  62 y.o. male is 1 day post op, s/p: creation of left brachiocephalic fistula   -Doing well this morning. Has a little soreness at the incision site -Denies any symptoms of steal. Left hand with intact motor and sensation. 2+ left radial pulse -Left arm incision well appearing and dry. No hematoma or swelling -LUE fistula with palpable thrill. Will arrange follow up with our office in 4-6 wks with dialysis duplex -Stable for  d/c from vascular perspective   Loel Dubonnet, PA-C [facility] [phone] 09/11/2023 7:52 AM

## 2023-09-11 NOTE — Discharge Summary (Cosign Needed Addendum)
Patient ID: Curtis Hartman MRN: [id number] DOB/AGE: Mar 12, 1961 62 y.o.  Admit date: 09/01/2023 Discharge date: 09/11/2023  Admission Diagnoses: Lumbar spinal stenosis with radiculoapthy, spondylolisthesis, coronal imbalance.   Discharge Diagnoses: Same    Discharged Condition: Stable  Hospital Course:  Curtis Hartman is a 62 y.o. male who was admitted following an uncomplicated L2-5 DLIF with percutaneous instrumentation on 09/01/23. They were recovered in PACU and transferred to 5N. During admission patient developed worsening kidney function and TRH/Nephrology were consulted. Ultimately patient was deemed HD candidate and went to OR on 09/10/23 for L brachiocephalic AVF. This procedure was uncomplicated as well. Patient was deemed acceptable today for dc to CIR by [facility], Nephrology, and Vascular teams. Pt's labs, VS stable for discharge today to CIR. He is tolerating a normal diet and has functional bladder/bowel. Pt to f/u in office for routine post op visit. Pt is in agreement w/ plan.    Discharge Exam: Blood pressure (!) 111/58, pulse 84, temperature 98.8 F (37.1 C), temperature source Oral, resp. rate 18, height 5\' 9"  (1.753 m), weight 71.2 kg, SpO2 97%. A&O x3 Speech fluent, appropriate Strength grossly intact.  SILTx4.  Incisions c/d/I.   Disposition: Discharge disposition: [facility] Not Defined       Discharge Instructions     Incentive spirometry RT   Complete by: As directed       Allergies as of 09/11/2023   No Active Allergies      Medication List     ASK your doctor about these medications    albuterol 108 (90 Base) MCG/ACT inhaler Commonly known as: ProAir HFA 2 puffs every 4 hours as needed only  if your can't catch your breath   amLODipine 10 MG tablet Commonly known as: NORVASC Take 10 mg by mouth daily as needed (diastolic blood pressure above 90).   azithromycin 250 MG tablet Commonly known as: ZITHROMAX Take  2 on day one then 1 daily x 4 days   Benefiber Powd Mix 2 - 3 teaspoons in 8 ounces of water daily in the mornings.   Breztri Aerosphere 160-9-4.8 MCG/ACT Aero Generic drug: Budeson-Glycopyrrol-Formoterol Inhale 2 puffs into the lungs in the morning and at bedtime.   calcitRIOL 0.25 MCG capsule Commonly known as: ROCALTROL Take 0.25 mcg by mouth 3 (three) times a week.   calcium acetate 667 MG tablet Commonly known as: PHOSLO Take 667 mg by mouth 2 (two) times daily.   fluticasone 50 MCG/ACT nasal spray Commonly known as: FLONASE Place 2 sprays into both nostrils daily as needed for allergies (congestion).   gabapentin 400 MG capsule Commonly known as: NEURONTIN Take 400 mg by mouth 2 (two) times daily.   insulin aspart 100 UNIT/ML injection Commonly known as: novoLOG Inject 2-10 Units into the skin 3 (three) times daily as needed (blood sugar over 150).   Lantus 100 UNIT/ML injection Generic drug: insulin glargine Inject 5 Units into the skin at bedtime as needed (blood sugar over 150).   Lokelma 10 g Pack packet Generic drug: sodium zirconium cyclosilicate Take 10 g by mouth daily as needed (high potassium).   morphine 15 MG tablet Commonly known as: MSIR Take 15 mg by mouth every 6 (six) hours as needed for severe pain.   olmesartan 5 MG tablet Commonly known as: BENICAR Take 5 mg by mouth daily as needed (diastolic blood pressure above 90).               Durable Medical Equipment  (  From admission, onward)           Start     Ordered   09/07/23 1510  For home use only DME Walker rolling  Once       Question Answer Comment  Walker: With 5 Inch Wheels   Patient needs a walker to treat with the following condition Gait instability      09/07/23 1509            Follow-up Information     Wilmon Pali, FNP Follow up.   Specialty: Family Medicine Contact information: [address] [place] [address] [phone]         [facility] Follow up.   Why: 4-6 weeks. The office will call the patient with an appointment (sent) Contact information: [address] [place] [address] [phone]                Signed: Clovis Riley 09/11/2023, 12:05 PM

## 2023-09-11 NOTE — Discharge Instructions (Addendum)
Inpatient Rehab Discharge Instructions  Curtis Hartman Discharge date and time: No discharge date for patient encounter.   Activities/Precautions/ Functional Status: Activity: no lifting, driving, or strenuous exercise until cleared by MD Diet: renal diet Wound Care: keep wound clean and dry Functional status:  ___ No restrictions     ___ Walk up steps independently ___ 24/7 supervision/assistance   ___ Walk up steps with assistance _x__ Intermittent supervision/assistance  ___ Bathe/dress independently ___ Walk with walker     ___ Bathe/dress with assistance ___ Walk Independently    ___ Shower independently ___ Walk with assistance    __x_ Shower with assistance __x_ No alcohol     ___ Return to work/school ________  Special Instructions: No driving, alcohol consumption or tobacco use.    COMMUNITY REFERRALS UPON DISCHARGE:    Outpatient: PT             Agency:[facility]   Phone:[phone]              Appointment Date/Time:WILL CALL TO SCHEDULE FOLLOW UP APPOINTMENT  Medical Equipment/Items Ordered:NO EQUIPMENT NEEDS-HAS EQUIPMENT FROM PREVIOUS SURGERIES                                                 Agency/Supplier:NA    My questions have been answered and I understand these instructions. I will adhere to these goals and the provided educational materials after my discharge from the hospital.  Patient/Caregiver Signature _______________________________ Date __________  Clinician Signature _______________________________________ Date __________  Please bring this form and your medication list with you to all your follow-up doctor's appointments.

## 2023-09-11 NOTE — H&P (Signed)
Physical Medicine and Rehabilitation Admission H&P     CC: Functional deficits secondary to lumbar stenosis   HPI: Curtis Hartman is a 62 year old male with a history of C3-C7 PCD F who developed progressive mechanical back pain and bilateral radicular symptoms refractory to medical therapy, physical therapy and pain management.  He is followed by Dr. Hoyt Koch.  Discussed recommendations for L2-3, L3-4, L4-5 DLIF, posterior percutaneous fusion and possible MIS posterior decompression.  The patient consented and was taken to the operating room on 09/01/2023 and underwent direct lumbar interbody fusion L2-3, L3-4, L4-5, right prone Tran psoas approach, L2-L5 posterior percutaneous fusion and L4-L5 minimally invasive laminectomy and foraminotomies.  He also performed L2-5 lumbar percutaneous pedicle screw.  Stable postoperatively with recommendations to apply LSO when out of bed.  He had acute postoperative hypertensive urgency, AKI on CKD, urinary retention and Foley was placed.  Likely benign prostatic hypertrophy and placed on Flomax. TRH consulted for chronic kidney disease and elevated creatinine.  Follows as an outpatient with Dr. Wolfgang Phoenix.  Losartan discontinued and kidney ultrasound and urine studies ordered.  Nephrology consulted.  No uremic symptoms or indications for dialysis, however he is nearing need for hemodialysis and vascular surgery was consulted for creation of AV fistula.  On subcutaneous heparin for DVT prophylaxis.  Dr. Maurice Small discussed with him on 11/02 that his psoas weakness is expected and approach related and should improve with time.  On 11/07 he was taken to the operating room where he underwent left brachiocephalic arteriovenous fistula by Dr. Hetty Blend.  Was transfused 1 unit of packed red blood cells for hemoglobin of 7.0.  Currently receiving Venofer.The patient requires inpatient medicine and rehabilitation evaluations and services for ongoing dysfunction secondary to  stenosis.   The patient lives in a single-story home in [place] with proxy 2 steps to enter, without a railing.  He lives with his younger sister and her boyfriend.  He used a rollator for ambulation at baseline.    Review of record: patient seen at [facility] ED in [place] on 08/30/2023 for laceration of right 4th toe. Referred to orthopedics for evaluation of possible foreign body and suture removal (7-10 days). Denies pain.     Review of Systems  Constitutional:  Negative for chills and fever.  Respiratory:  Negative for cough and shortness of breath.   Cardiovascular:  Negative for chest pain and palpitations.  Gastrointestinal:  Positive for constipation. Negative for diarrhea, nausea and vomiting.  Genitourinary:        Retention   Musculoskeletal:  Positive for back pain.  Skin:  Positive for itching.  Neurological:  Positive for tingling, sensory change and weakness.  Psychiatric/Behavioral:  Negative for depression. The patient is nervous/anxious and has insomnia.         Past Medical History:  Diagnosis Date   Arthritis     CKD (chronic kidney disease)     COPD (chronic obstructive pulmonary disease) (HCC)     Diabetes mellitus without complication (HCC)      type 2   GERD (gastroesophageal reflux disease)     Headache      Hx Migraines   Hepatitis     Hypertension     Neuropathy               Past Surgical History:  Procedure Laterality Date   ANTERIOR CERVICAL DECOMP/DISCECTOMY FUSION N/A 11/05/2022    Procedure: Anterior Cervical Decompression/discectomy Fusion Cervical Four-Cervical Five;  Surgeon: Bedelia Person,  MD;  Location: [facility] OR;  Service: Neurosurgery;  Laterality: N/A;  3C   ANTERIOR LAT LUMBAR FUSION N/A 09/01/2023    Procedure: DIRECT LUMBAR INTERBODY FUSION, LUMBAR TWO-LUMBAR THREE, LUMBAR THREE-LUMBAR FOUR, LUMBAR FOUR-LUMBAR FIVE, RIGHT PRONE TRANSPSOAS, LUMBAR TWO LUMBAR FIVE POSTERIOR PERCUTANEOUS FUSION LUMBAR FOUR-LUMBAR FIVE  MINIMALLY INVASIVE LAMINECTOMY, FORAMINOTOMY;  Surgeon: Bedelia Person, MD;  Location: [facility] OR;  Service: Neurosurgery;  Laterality: N/A;   AV FISTULA PLACEMENT Left 09/10/2023    Procedure: LEFT ARM ARTERIOVENOUS (AV) FISTULA CREATION;  Surgeon: Daria Pastures, MD;  Location: [facility] OR;  Service: Vascular;  Laterality: Left;   BACK SURGERY   2023    Cervical Spine   BIOPSY   03/03/2022    Procedure: BIOPSY;  Surgeon: Lanelle Bal, DO;  Location: [facility];  Service: Endoscopy;;   COLONOSCOPY WITH PROPOFOL N/A 03/03/2022    Procedure: COLONOSCOPY WITH PROPOFOL;  Surgeon: Lanelle Bal, DO;  [facility];  Service: Endoscopy;  Laterality: N/A;  8:00am   ESOPHAGOGASTRODUODENOSCOPY (EGD) WITH PROPOFOL N/A 03/03/2022    Procedure: ESOPHAGOGASTRODUODENOSCOPY (EGD) WITH PROPOFOL;  Surgeon: Lanelle Bal, DO;  [facility];  Service: Endoscopy;  Laterality: N/A;   foot right partial toe amputation Right     FOOT SURGERY Right      x3   hernia surgery x4       LUMBAR PERCUTANEOUS PEDICLE SCREW 3 LEVEL N/A 09/01/2023    Procedure: L2-5 LUMBAR PERCUTANEOUS PEDICLE SCREW 3 LEVEL;  Surgeon: Bedelia Person, MD;  Location: [facility] OR;  Service: Neurosurgery;  Laterality: N/A;   POLYPECTOMY   03/03/2022    Procedure: POLYPECTOMY;  Surgeon: Lanelle Bal, DO;  [facility];  Service: Endoscopy;;             Family History  Problem Relation Age of Onset   Heart disease Mother     Seizures Father     Stroke Maternal Grandfather     Liver disease Paternal Grandmother          Social History:  reports that he has been smoking cigarettes. He has never been exposed to tobacco smoke. He has never used smokeless tobacco. He reports that he does not currently use alcohol. He reports that he does not currently use drugs. Allergies:  Allergies  No Active Allergies         Medications Prior to Admission  Medication Sig Dispense Refill   amLODipine  (NORVASC) 10 MG tablet Take 10 mg by mouth daily as needed (diastolic blood pressure above 90).       calcitRIOL (ROCALTROL) 0.25 MCG capsule Take 0.25 mcg by mouth 3 (three) times a week.       calcium acetate (PHOSLO) 667 MG tablet Take 667 mg by mouth 2 (two) times daily.       fluticasone (FLONASE) 50 MCG/ACT nasal spray Place 2 sprays into both nostrils daily as needed for allergies (congestion).       gabapentin (NEURONTIN) 400 MG capsule Take 400 mg by mouth 2 (two) times daily.       insulin aspart (NOVOLOG) 100 UNIT/ML injection Inject 2-10 Units into the skin 3 (three) times daily as needed (blood sugar over 150).       LANTUS 100 UNIT/ML injection Inject 5 Units into the skin at bedtime as needed (blood sugar over 150).       morphine (MSIR) 15 MG tablet Take 15 mg by mouth every 6 (six) hours  as needed for severe pain.       olmesartan (BENICAR) 5 MG tablet Take 5 mg by mouth daily as needed (diastolic blood pressure above 90).       Wheat Dextrin (BENEFIBER) POWD Mix 2 - 3 teaspoons in 8 ounces of water daily in the mornings. 475 g 0   albuterol (PROAIR HFA) 108 (90 Base) MCG/ACT inhaler 2 puffs every 4 hours as needed only  if your can't catch your breath 18 g 11   azithromycin (ZITHROMAX) 250 MG tablet Take 2 on day one then 1 daily x 4 days (Patient not taking: Reported on 08/19/2023) 6 tablet 0   Budeson-Glycopyrrol-Formoterol (BREZTRI AEROSPHERE) 160-9-4.8 MCG/ACT AERO Inhale 2 puffs into the lungs in the morning and at bedtime. (Patient taking differently: Inhale 2 puffs into the lungs 2 (two) times daily as needed (shortness of breath).)       LOKELMA 10 g PACK packet Take 10 g by mouth daily as needed (high potassium).                  Home: Home Living Family/patient expects to be discharged to:: Private residence Living Arrangements: Other relatives (sister and her boyfriend) Available Help at Discharge: Family, Available 24 hours/day Type of Home: House Home Access:  Stairs to enter Secretary/administrator of Steps: 2 Home Layout: One level Bathroom Shower/Tub: Engineer, manufacturing systems: Standard Home Equipment: Rollator (4 wheels), BSC/3in1, Adaptive equipment, Shower seat, Tub bench Adaptive Equipment: Reacher   Functional History: Prior Function Prior Level of Function : Independent/Modified Independent Mobility Comments: uses rollator outside, and generally no AD in the home as he furniture walks ADLs Comments: pt managing ADLs and light IADls   Functional Status:  Mobility: Bed Mobility Overal bed mobility: Needs Assistance Bed Mobility: Sidelying to Sit, Rolling Rolling: Supervision Sidelying to sit: Supervision Sit to sidelying: Mod assist General bed mobility comments: OOB on BSC upon entry Transfers Overall transfer level: Needs assistance Equipment used: Rolling walker (2 wheels) Transfers: Sit to/from Stand Sit to Stand: Min assist Bed to/from chair/wheelchair/BSC transfer type:: Step pivot Step pivot transfers: Min assist General transfer comment: Cues for safe BUE placement and posture; STS x 2 from [facility], slow to initiate, pt with loose stools upon standing so had to sit back down to continue BM Ambulation/Gait Ambulation/Gait assistance: Min assist Gait Distance (Feet): 55 Feet (+ 25 ft) Assistive device: Rolling walker (2 wheels) Gait Pattern/deviations: Step-through pattern, Decreased stride length, Trunk flexed, Decreased dorsiflexion - right, Decreased dorsiflexion - left, Knee flexed in stance - right, Knee flexed in stance - left General Gait Details: Defer, pt with diarrhea each time standing and not safe to progress gait at time of session. Gait velocity: decreased Gait velocity interpretation: <1.31 ft/sec, indicative of household ambulator   ADL: ADL Overall ADL's : Needs assistance/impaired Grooming: Set up, Sitting Upper Body Dressing : Minimal assistance, Sitting Upper Body Dressing Details (indicate cue  type and reason): brace Lower Body Dressing: Minimal assistance, Sit to/from stand, With adaptive equipment Lower Body Dressing Details (indicate cue type and reason): used AE, min assist to stand and support to managing clothing over hips Toilet Transfer: Minimal assistance, BSC/3in1, Rolling walker (2 wheels) Toilet Transfer Details (indicate cue type and reason): from EOB min A, min guard from recliner with B arm rests Toileting- Clothing Manipulation and Hygiene: Moderate assistance, Sit to/from stand Toileting - Clothing Manipulation Details (indicate cue type and reason): hygiene, clothing mgmt Functional mobility during ADLs: Contact guard assist,  Rolling walker (2 wheels), Cueing for safety   Cognition: Cognition Overall Cognitive Status: Impaired/Different from baseline Orientation Level: Oriented X4 Cognition Arousal: Alert Behavior During Therapy: WFL for tasks assessed/performed Overall Cognitive Status: Impaired/Different from baseline Area of Impairment: Awareness, Problem solving Following Commands: Follows one step commands inconsistently Safety/Judgement: Decreased awareness of safety, Decreased awareness of deficits Awareness: Emergent Problem Solving: Slow processing, Requires verbal cues General Comments: pt needing increased cues for technique and safety. poor problem solving and understanding of PT education.   Physical Exam: Blood pressure (!) 111/58, pulse 84, temperature 98.8 F (37.1 C), temperature source Oral, resp. rate 18, height 5\' 9"  (1.753 m), weight 71.2 kg, SpO2 97%.  Physical Exam Constitutional: No apparent distress. Appropriate appearance for age. Laying in bed.  HENT: No JVD. Neck Supple. Trachea midline. Atraumatic, normocephalic. Eyes: PERRLA. EOMI. Visual fields grossly intact.  Cardiovascular: RRR, no murmurs/rub/gallops. No Edema. Peripheral pulses 2+  Respiratory: CTAB. No rales, rhonchi, or wheezing. On RA.  Abdomen: + bowel sounds,  normoactive. No distention or tenderness.  GU: Not examined. +Foley, draining clear urine.  Skin:  RLE 3rd toe dried blood overlying interrupted sutures Lumbar surgical site with bilateral incisions glued, c/d/I, no erythema or drainage L brachiocephalic AV fistula - covered in dressing, no drainage      MSK: + R foot 1-2nd toe amputations + Popeye deformity of right biceps + Bilateral thenar eminence wasting + Active range of motion left shoulder limited flexion, abduction   Neurologic exam:  Cognition: AAO to person, place, time and event.  Language: Fluent, No substitutions or neoglisms. No dysarthria. Names 3/3 objects correctly.  Memory: Recalls 3/3 objects at 5 minutes. No apparent deficits  Insight: Good insight into current condition.  Mood: Pleasant affect, appropriate mood.  Sensation: To light touch hypersensitive/altered over proximal thighs and groin Reflexes: 1+ in BL UE and LEs. Negative Hoffman's and babinski signs bilaterally.  CN: 2-12 grossly intact.  Coordination: Mild bilateral upper extremity intention tremors. No ataxia on FTN  Spasticity: MAS 0 in all extremities.  Strength: Bilateral upper extremities 4+ out of 5   Left lower extremity 4+/5 hip flexion, 5 -/5 distal muscles Right lower extremity 4-/5 hip flexion, knee extension, dorsiflexion; 5-/5 plantarflexion          Lab Results Last 48 Hours        Results for orders placed or performed during the hospital encounter of 09/01/23 (from the past 48 hour(s))  Glucose, capillary     Status: Abnormal    Collection Time: 09/09/23 11:17 AM  Result Value Ref Range    Glucose-Capillary 115 (H) 70 - 99 mg/dL      Comment: Glucose reference range applies only to samples taken after fasting for at least 8 hours.  Glucose, capillary     Status: Abnormal    Collection Time: 09/09/23  4:20 PM  Result Value Ref Range    Glucose-Capillary 180 (H) 70 - 99 mg/dL      Comment: Glucose reference range applies  only to samples taken after fasting for at least 8 hours.  Glucose, capillary     Status: None    Collection Time: 09/09/23  8:23 PM  Result Value Ref Range    Glucose-Capillary 97 70 - 99 mg/dL      Comment: Glucose reference range applies only to samples taken after fasting for at least 8 hours.  CBC     Status: Abnormal    Collection Time: 09/09/23  8:38 PM  Result Value Ref Range    WBC 10.9 (H) 4.0 - 10.5 K/uL    RBC 2.60 (L) 4.22 - 5.81 MIL/uL    Hemoglobin 7.6 (L) 13.0 - 17.0 g/dL    HCT 20.2 (L) 54.2 - 52.0 %    MCV 90.8 80.0 - 100.0 fL    MCH 29.2 26.0 - 34.0 pg    MCHC 32.2 30.0 - 36.0 g/dL    RDW 70.6 23.7 - 62.8 %    Platelets 327 150 - 400 K/uL    nRBC 0.0 0.0 - 0.2 %      Comment: Performed at [facility], [address], [place], [place] [address]  Glucose, capillary     Status: Abnormal    Collection Time: 09/10/23  7:30 AM  Result Value Ref Range    Glucose-Capillary 110 (H) 70 - 99 mg/dL      Comment: Glucose reference range applies only to samples taken after fasting for at least 8 hours.  Renal function panel     Status: Abnormal    Collection Time: 09/10/23  7:49 AM  Result Value Ref Range    Sodium 139 135 - 145 mmol/L    Potassium 4.7 3.5 - 5.1 mmol/L    Chloride 105 98 - 111 mmol/L    CO2 24 22 - 32 mmol/L    Glucose, Bld 105 (H) 70 - 99 mg/dL      Comment: Glucose reference range applies only to samples taken after fasting for at least 8 hours.    BUN 89 (H) 8 - 23 mg/dL    Creatinine, Ser 6.16 (H) 0.61 - 1.24 mg/dL    Calcium 8.3 (L) 8.9 - 10.3 mg/dL    Phosphorus 5.4 (H) 2.5 - 4.6 mg/dL    Albumin 2.2 (L) 3.5 - 5.0 g/dL    GFR, Estimated 13 (L) >60 mL/min      Comment: (NOTE) Calculated using the CKD-EPI Creatinine Equation (2021)      Anion gap 10 5 - 15      Comment: Performed at [facility], [address], [place], [place] [address]  Iron and TIBC     Status: Abnormal    Collection Time: 09/10/23  7:49 AM  Result Value Ref  Range    Iron 45 45 - 182 ug/dL    TIBC 062 (L) 694 - 854 ug/dL    Saturation Ratios 23 17.9 - 39.5 %    UIBC 150 ug/dL      Comment: Performed at [facility], [address], [place], [place] [address]  CBC     Status: Abnormal    Collection Time: 09/10/23  7:49 AM  Result Value Ref Range    WBC 8.9 4.0 - 10.5 K/uL    RBC 2.41 (L) 4.22 - 5.81 MIL/uL    Hemoglobin 7.0 (L) 13.0 - 17.0 g/dL    HCT 50.0 (L) 93.8 - 52.0 %    MCV 90.9 80.0 - 100.0 fL    MCH 29.0 26.0 - 34.0 pg    MCHC 32.0 30.0 - 36.0 g/dL    RDW 18.2 99.3 - 71.6 %    Platelets 307 150 - 400 K/uL    nRBC 0.0 0.0 - 0.2 %      Comment: Performed at [facility], [address], [place], [place] [address]  I-STAT, [place] 8     Status: Abnormal    Collection Time: 09/10/23 10:57 AM  Result  Value Ref Range    Sodium 139 135 - 145 mmol/L    Potassium 4.8 3.5 - 5.1 mmol/L    Chloride 104 98 - 111 mmol/L    BUN 92 (H) 8 - 23 mg/dL    Creatinine, Ser 4.78 (H) 0.61 - 1.24 mg/dL    Glucose, Bld 99 70 - 99 mg/dL      Comment: Glucose reference range applies only to samples taken after fasting for at least 8 hours.    Calcium, Ion 1.17 1.15 - 1.40 mmol/L    TCO2 24 22 - 32 mmol/L    Hemoglobin 7.8 (L) 13.0 - 17.0 g/dL    HCT 29.5 (L) 62.1 - 52.0 %  Glucose, capillary     Status: None    Collection Time: 09/10/23 12:51 PM  Result Value Ref Range    Glucose-Capillary 79 70 - 99 mg/dL      Comment: Glucose reference range applies only to samples taken after fasting for at least 8 hours.    Comment 1 Notify RN    Type and screen [facility]     Status: None    Collection Time: 09/10/23  3:02 PM  Result Value Ref Range    ABO/RH(D) A POS      Antibody Screen NEG      Sample Expiration 09/13/2023,2359      Unit Number [id number]      Blood Component Type RED CELLS,LR      Unit division 00      Status of Unit ISSUED,FINAL      Transfusion Status OK TO TRANSFUSE      Crossmatch Result           Compatible Performed at [facility], [address] [place], [place] [address]    Prepare RBC (crossmatch)     Status: None    Collection Time: 09/10/23  3:05 PM  Result Value Ref Range    Order Confirmation          ORDER PROCESSED BY BLOOD BANK Performed at [facility], [address], [place], [place] [address]    Glucose, capillary     Status: None    Collection Time: 09/10/23  4:56 PM  Result Value Ref Range    Glucose-Capillary 96 70 - 99 mg/dL      Comment: Glucose reference range applies only to samples taken after fasting for at least 8 hours.  Glucose, capillary     Status: Abnormal    Collection Time: 09/10/23  8:15 PM  Result Value Ref Range    Glucose-Capillary 144 (H) 70 - 99 mg/dL      Comment: Glucose reference range applies only to samples taken after fasting for at least 8 hours.  Renal function panel     Status: Abnormal    Collection Time: 09/11/23  6:12 AM  Result Value Ref Range    Sodium 139 135 - 145 mmol/L    Potassium 5.3 (H) 3.5 - 5.1 mmol/L    Chloride 107 98 - 111 mmol/L    CO2 22 22 - 32 mmol/L    Glucose, Bld 106 (H) 70 - 99 mg/dL      Comment: Glucose reference range applies only to samples taken after fasting for at least 8 hours.    BUN 85 (H) 8 - 23 mg/dL    Creatinine, Ser 1.02 (H) 0.61 - 1.24 mg/dL    Calcium 7.7 (L) 8.9 - 10.3 mg/dL  Phosphorus 5.6 (H) 2.5 - 4.6 mg/dL    Albumin 2.2 (L) 3.5 - 5.0 g/dL    GFR, Estimated 12 (L) >60 mL/min      Comment: (NOTE) Calculated using the CKD-EPI Creatinine Equation (2021)      Anion gap 10 5 - 15      Comment: Performed at [facility], [address], [place], [place] [address]  CBC     Status: Abnormal    Collection Time: 09/11/23  6:12 AM  Result Value Ref Range    WBC 10.6 (H) 4.0 - 10.5 K/uL    RBC 2.69 (L) 4.22 - 5.81 MIL/uL    Hemoglobin 7.9 (L) 13.0 - 17.0 g/dL    HCT 19.1 (L) 47.8 - 52.0 %    MCV 90.7 80.0 - 100.0 fL    MCH 29.4 26.0 - 34.0 pg    MCHC 32.4  30.0 - 36.0 g/dL    RDW 29.5 62.1 - 30.8 %    Platelets 317 150 - 400 K/uL    nRBC 0.0 0.0 - 0.2 %      Comment: Performed at [facility], [address] [place], [place] [address]  Glucose, capillary     Status: Abnormal    Collection Time: 09/11/23  7:33 AM  Result Value Ref Range    Glucose-Capillary 132 (H) 70 - 99 mg/dL      Comment: Glucose reference range applies only to samples taken after fasting for at least 8 hours.    Comment 1 Notify RN      Comment 2 Document in Chart        Imaging Results (Last 48 hours)  No results found.         Blood pressure (!) 111/58, pulse 84, temperature 98.8 F (37.1 C), temperature source Oral, resp. rate 18, height 5\' 9"  (1.753 m), weight 71.2 kg, SpO2 97%.   Medical Problem List and Plan: 1. Functional deficits secondary to lumbar stenosis with radiculopathy s/p  L2-L5 DLIF with instrumentation 10/29 with Dr. Maisie Fus             -patient may shower             -ELOS/Goals: 10-14 days, Mod I PT/OT   - Stable for CIR admission   2.  Antithrombotics: -DVT/anticoagulation:  Pharmaceutical: Heparin             -antiplatelet therapy: none   3. Pain Management: Tylenol, Robaxin, oxycodone as needed             -Continue gabapentin 100 mg twice daily   4. Mood/Behavior/Sleep: LCSW to evaluate and provide emotional support             -antipsychotic agents: n/a   5. Neuropsych/cognition: This patient is capable of making decisions on his own behalf.   6. Skin/Wound Care: Routine skin care checks   - R toe sutures removed on admission, WOC order for betadyne BID   7. Fluids/Electrolytes/Nutrition: Strict Is and Os and follow-up chemistries   8: Hypertension: monitor TID and prn; appears stable on oral agents             -continue amlodipine 10 mg daily             -continue carvedilol 3.125 mg twice daily   9: Diabetes mellitus: CBGs 4 times daily; A1c = 5.7%             -continue sliding scale insulin 0 to 15  units              -? 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 IBS: getting Imodium prn   16: Right 4th toe laceration PTA: well-healed, discontinue sutures.   17. ? R biceps tear - documented per record in 2022 by Dr. Hilda Lias at [facility]. Likely lost to follow up; would re-establish as OP.   18. Hx cervical stenosis s/p C4-T1 PCDF 2022, then C4/5 ACDF 11/2022 with Dr. Maisie Fus. Pain regimen as above.     Milinda Antis, PA-C 09/11/2023  I have examined the patient independently and edited the note for HPI, ROS, exam, assessment, and plan as appropriate. I am in agreement with the above recommendations.   [name], DO 09/11/2023

## 2023-09-11 NOTE — Progress Notes (Signed)
[name], DO  Physician Physical Medicine and Rehabilitation   PMR Pre-admission     Signed   Date of Service: 09/10/2023 11:17 AM  Related encounter: Admission (Discharged) from 09/01/2023 in [facility]   Signed     Expand All Collapse All  PMR Admission Coordinator Pre-Admission Assessment   Patient: Curtis Hartman is an 62 y.o., male MRN: [id number] DOB: 15-Apr-1961 Height: 5\' 9"  (175.3 cm) Weight: 71.2 kg                                                                                                                                                  Insurance Information HMO:     PPO:      PCP:      IPA:      80/20:      OTHER:  PRIMARYRolene Arbour Medicaid      Policy#: [id number]      Subscriber: pt CM Name: faxed approval from Ed    Phone#: [phone] Fax#: [phone] Pre-Cert#: [id number] auth for CIR from Ed with [facility] Medicaid with updates due to fax above on 09/16/23      Employer:  Benefits:  Phone #: [phone]     Name:  Eff. Date: 01/02/23     Deduct: $0      Out of Pocket Max: $0      Life Max: n/a  CIR: 100%      SNF: 100% Outpatient: 100%     Co-Pay:  Home Health: 100%      Co-Pay:  DME: 100%     Co-Pay:  Providers:  SECONDARY:       Policy#:       Phone#:    Artist:       Phone#:    The "Data Collection Information Summary" for patients in Inpatient Rehabilitation Facilities with attached "Privacy Act Statement-Health Care Records" was provided and verbally reviewed with: Patient and Family   Emergency Contact Information Contact Information       Name Relation Home Work Mobile    [place] Sister     [phone]         Other Contacts   None on File      Current Medical History  Patient Admitting Diagnosis: lumbar myelopathy   History of Present Illness: Pt is a 62 y/o male with PMH of arthritis, CKD, COPD, DM, hepatitis, HTN, prior PCDF, and neuropathy admitted to [facility] on 10/29 for planned intervention of severe lumbar stenosis with myelopathy.  He underwent a direct lumbar interbody fusion of L2-5 on 10/29 per Dr. Maisie Fus.  Post op course complicated by hypertensive emergency, AKI on stage IV CKD, urinary retention, uncontrolled DM, and pain control.  Therapy evaluations completed and pt was recommended for CIR.    Patient's medical record from  [facility] has been reviewed by the rehabilitation admission coordinator and physician.   Past Medical History      Past Medical History:  Diagnosis Date   Arthritis     CKD (chronic kidney disease)     COPD (chronic obstructive pulmonary disease) (HCC)     Diabetes mellitus without complication (HCC)      type 2   GERD (gastroesophageal reflux disease)     Headache      Hx Migraines   Hepatitis     Hypertension     Neuropathy            Has the patient had major surgery during 100 days prior to admission? Yes   Family History  family history includes Heart disease in his mother; Liver disease in his paternal grandmother; Seizures in his father; Stroke in his maternal grandfather.     Current Medications   Current Medications    Current Facility-Administered Medications:    acetaminophen (TYLENOL) tablet 650 mg, 650 mg, Oral, Q4H PRN, 650 mg at 09/08/23 2055 **OR** acetaminophen (TYLENOL) suppository 650 mg, 650 mg, Rectal, Q4H PRN, Patrici Ranks [place], PA-C   albuterol (PROVENTIL) (2.5 MG/3ML) 0.083% nebulizer solution 3 mL, 3 mL, Inhalation, Q6H PRN, Patrici Ranks [place], PA-C   amLODipine (NORVASC) tablet 10 mg, 10 mg, Oral, Daily PRN, Patrici Ranks [place], PA-C, 10 mg at 09/01/23 2101   amLODipine (NORVASC) tablet 10 mg, 10 mg, Oral, Daily, Elgergawy, Leana Roe, MD, 10 mg at 09/11/23 4098   calcitRIOL (ROCALTROL) capsule 0.25 mcg, 0.25 mcg, Oral, Once per day on Monday Wednesday Friday, Clovis Riley, PA-C, 0.25 mcg at 09/11/23 0902   calcium acetate (PHOSLO) capsule 667 mg, 667  mg, Oral, BID WC, Patrici Ranks [place], PA-C, 667 mg at 09/11/23 0902   carvedilol (COREG) tablet 3.125 mg, 3.125 mg, Oral, BID WC, Elgergawy, Leana Roe, MD, 3.125 mg at 09/11/23 1191   Chlorhexidine Gluconate Cloth 2 % PADS 6 each, 6 each, Topical, Daily, Bedelia Person, MD, 6 each at 09/10/23 1027   Darbepoetin Alfa (ARANESP) injection 60 mcg, 60 mcg, Subcutaneous, Q Wed-1800, Coladonato, Jomarie Longs, MD, 60 mcg at 09/09/23 2215   docusate sodium (COLACE) capsule 100 mg, 100 mg, Oral, BID, Patrici Ranks [place], PA-C, 100 mg at 09/05/23 0843   fluticasone furoate-vilanterol (BREO ELLIPTA) 100-25 MCG/ACT 1 puff, 1 puff, Inhalation, Daily, 1 puff at 09/11/23 0752 **AND** umeclidinium bromide (INCRUSE ELLIPTA) 62.5 MCG/ACT 1 puff, 1 puff, Inhalation, Daily, Bedelia Person, MD, 1 puff at 09/11/23 0752   gabapentin (NEURONTIN) capsule 400 mg, 400 mg, Oral, BID, Patrici Ranks [place], PA-C, 400 mg at 09/11/23 4782   heparin injection 5,000 Units, 5,000 Units, Subcutaneous, Q8H, Patrici Ranks [place], PA-C, 5,000 Units at 09/11/23 0546   hydrALAZINE (APRESOLINE) injection 5 mg, 5 mg, Intravenous, Q4H PRN, Elgergawy, Leana Roe, MD   HYDROmorphone (DILAUDID) injection 0.5 mg, 0.5 mg, Intravenous, Q3H PRN, Clovis Riley, PA-C, 0.5 mg at 09/10/23 9562   insulin aspart (novoLOG) injection 0-15 Units, 0-15 Units, Subcutaneous, TID WC, Patrici Ranks [place], PA-C, 2 Units at 09/11/23 0825   insulin aspart (novoLOG) injection 0-5 Units, 0-5 Units, Subcutaneous, QHS, [place], Sara Caylin, PA-C   labetalol (NORMODYNE) injection 10 mg, 10 mg, Intravenous, Q2H PRN, Lisbeth Renshaw, MD, 10 mg at 09/02/23 0102   loperamide (IMODIUM) capsule 2 mg, 2 mg, Oral, PRN, [place], Alvira Philips, DO, 2 mg at 09/10/23 2110   menthol-cetylpyridinium (CEPACOL) lozenge 3 mg, 1 lozenge, Oral, PRN **OR**  phenol (CHLORASEPTIC) mouth spray 1 spray, 1 spray, Mouth/Throat, PRN, Patrici Ranks [place], PA-C   methocarbamol  (ROBAXIN) tablet 500 mg, 500 mg, Oral, Q6H PRN, 500 mg at 09/11/23 0440 **OR** methocarbamol (ROBAXIN) injection 500 mg, 500 mg, Intravenous, Q6H PRN, Patrici Ranks [place], PA-C   ondansetron [facility]) tablet 4 mg, 4 mg, Oral, Q6H PRN, 4 mg at 09/02/23 0841 **OR** ondansetron (ZOFRAN) injection 4 mg, 4 mg, Intravenous, Q6H PRN, Patrici Ranks [place], PA-C   oxyCODONE (Oxy IR/ROXICODONE) immediate release tablet 10 mg, 10 mg, Oral, Q4H PRN, Patrici Ranks [place], PA-C, 10 mg at 09/11/23 6295   oxyCODONE (Oxy IR/ROXICODONE) immediate release tablet 5 mg, 5 mg, Oral, Q4H PRN, Patrici Ranks [place], PA-C   polyethylene glycol (MIRALAX / GLYCOLAX) packet 17 g, 17 g, Oral, Daily PRN, Patrici Ranks [place], PA-C   sodium bicarbonate tablet 650 mg, 650 mg, Oral, TID, Elgergawy, Leana Roe, MD, 650 mg at 09/11/23 0903   sodium chloride flush (NS) 0.9 % injection 3 mL, 3 mL, Intravenous, Q12H, Patrici Ranks [place], PA-C, 3 mL at 09/10/23 2111   sodium chloride flush (NS) 0.9 % injection 3 mL, 3 mL, Intravenous, PRN, Patrici Ranks [place], PA-C   tamsulosin [facility]) capsule 0.4 mg, 0.4 mg, Oral, Daily, Elgergawy, Leana Roe, MD, 0.4 mg at 09/11/23 2841     Patients Current Diet:  Diet Order                  Diet Carb Modified Fluid consistency: Thin; Room service appropriate? Yes  Diet effective now                         Precautions / Restrictions Precautions Precautions: Back, Other (comment) Precaution Booklet Issued: Yes (comment) Precaution Comments: foley Spinal Brace: Lumbar corset, Applied in sitting position Restrictions Weight Bearing Restrictions: No Other Position/Activity Restrictions: Pt required assist to don brace.    Has the patient had 2 or more falls or a fall with injury in the past year?No   Prior Activity Level Limited Community (1-2x/wk): rollator in community, furniture surfs for household mobility, no assist for ADLs at baseline   Prior Functional  Level Prior Function Prior Level of Function : Independent/Modified Independent Mobility Comments: uses rollator outside, and generally no AD in the home as he furniture walks ADLs Comments: pt managing ADLs and light IADls   Self Care: Did the patient need help bathing, dressing, using the toilet or eating?  Independent   Indoor Mobility: Did the patient need assistance with walking from room to room (with or without device)? Independent   Stairs: Did the patient need assistance with internal or external stairs (with or without device)? Independent   Functional Cognition: Did the patient need help planning regular tasks such as shopping or remembering to take medications? Independent   Patient Information Are you of Hispanic, Latino/a,or Spanish origin?: A. No, not of Hispanic, Latino/a, or Spanish origin What is your race?: A. White Do you need or want an interpreter to communicate with a doctor or health care staff?: 0. No   Patient's Response To:  Health Literacy and Transportation Is the patient able to respond to health literacy and transportation needs?: Yes Health Literacy - How often do you need to have someone help you when you read instructions, pamphlets, or other written material from your doctor or pharmacy?: Never In the past 12 months, has lack of transportation kept you from medical appointments or from getting medications?: Yes In  the past 12 months, has lack of transportation kept you from meetings, work, or from getting things needed for daily living?: Yes   Home Assistive Devices / Equipment Home Equipment: Rollator (4 wheels), BSC/3in1, Adaptive equipment, Shower seat, Tub bench   Prior Device Use: Indicate devices/aids used by the patient prior to current illness, exacerbation or injury? Walker   Current Functional Level Cognition   Overall Cognitive Status: Impaired/Different from baseline Orientation Level: Oriented X4 Following Commands: Follows one step  commands inconsistently Safety/Judgement: Decreased awareness of safety, Decreased awareness of deficits General Comments: pt needing increased cues for technique and safety. poor problem solving and understanding of PT education.    Extremity Assessment (includes Sensation/Coordination)   Upper Extremity Assessment: Right hand dominant, Generalized weakness, LUE deficits/detail LUE Deficits / Details: limited shoulder flexion to 15-20 degress due to arthritis  Lower Extremity Assessment: Defer to PT evaluation     ADLs   Overall ADL's : Needs assistance/impaired Grooming: Set up, Sitting Upper Body Dressing : Minimal assistance, Sitting Upper Body Dressing Details (indicate cue type and reason): brace Lower Body Dressing: Minimal assistance, Sit to/from stand, With adaptive equipment Lower Body Dressing Details (indicate cue type and reason): used AE, min assist to stand and support to managing clothing over hips Toilet Transfer: Minimal assistance, BSC/3in1, Rolling walker (2 wheels) Toilet Transfer Details (indicate cue type and reason): from EOB min A, min guard from recliner with B arm rests Toileting- Clothing Manipulation and Hygiene: Moderate assistance, Sit to/from stand Toileting - Clothing Manipulation Details (indicate cue type and reason): hygiene, clothing mgmt Functional mobility during ADLs: Contact guard assist, Rolling walker (2 wheels), Cueing for safety     Mobility   Overal bed mobility: Needs Assistance Bed Mobility: Sidelying to Sit, Rolling Rolling: Supervision Sidelying to sit: Supervision Sit to sidelying: Mod assist General bed mobility comments: OOB on BSC upon entry     Transfers   Overall transfer level: Needs assistance Equipment used: Rolling walker (2 wheels) Transfers: Sit to/from Stand Sit to Stand: Min assist Bed to/from chair/wheelchair/BSC transfer type:: Step pivot Step pivot transfers: Min assist General transfer comment: Cues for safe BUE  placement and posture; STS x 2 from [facility], slow to initiate, pt with loose stools upon standing so had to sit back down to continue BM     Ambulation / Gait / Stairs / Wheelchair Mobility   Ambulation/Gait Ambulation/Gait assistance: Editor, commissioning (Feet): 55 Feet (+ 25 ft) Assistive device: Rolling walker (2 wheels) Gait Pattern/deviations: Step-through pattern, Decreased stride length, Trunk flexed, Decreased dorsiflexion - right, Decreased dorsiflexion - left, Knee flexed in stance - right, Knee flexed in stance - left General Gait Details: Defer, pt with diarrhea each time standing and not safe to progress gait at time of session. Gait velocity: decreased Gait velocity interpretation: <1.31 ft/sec, indicative of household ambulator     Posture / Balance Dynamic Sitting Balance Sitting balance - Comments: sitting forward in chair; pt tends to lean back due to back discomfort Balance Overall balance assessment: Needs assistance Sitting-balance support: No upper extremity supported, Feet supported Sitting balance-Leahy Scale: Fair Sitting balance - Comments: sitting forward in chair; pt tends to lean back due to back discomfort Standing balance support: Bilateral upper extremity supported, During functional activity Standing balance-Leahy Scale: Poor Standing balance comment: BUE support on RW     Special needs/care consideration Skin surgical incision to lumbar spine and Diabetic management yes        Previous Home Environment (  from acute therapy documentation) Living Arrangements: Other relatives (sister and her boyfriend) Available Help at Discharge: Family, Available 24 hours/day Type of Home: House Home Layout: One level Home Access: Stairs to enter [employer] of Steps: 2 Bathroom Shower/Tub: Engineer, manufacturing systems: Standard Home Care Services: No   Discharge Living Setting Plans for Discharge Living Setting: Patient's home, Lives with  (comment) (sister and BIL) Type of Home at Discharge: House Discharge Home Layout: One level Discharge Home Access: Stairs to enter Entrance Stairs-Rails: None Entrance Stairs-Number of Steps: 2 Discharge Bathroom Shower/Tub: Tub/shower unit Discharge Bathroom Toilet: Standard Discharge Bathroom Accessibility: Yes How Accessible: Accessible via walker Does the patient have any problems obtaining your medications?: No   Social/Family/Support Systems Anticipated Caregiver: sister, Dois Davenport Anticipated Industrial/product designer Information: [phone] Ability/Limitations of Caregiver: none stated Caregiver Availability: 24/7 Discharge Plan Discussed with Primary Caregiver: Yes Is Caregiver In Agreement with Plan?: Yes Does Caregiver/Family have Issues with Lodging/Transportation while Pt is in Rehab?: No     Goals Patient/Family Goal for Rehab: PT/OT supervision to mod I, SLP n/a Expected length of stay: 10-14 days Additional Information: Discharge plan: return home with sister and BIL who are able to provide 24/7 support Pt/Family Agrees to Admission and willing to participate: Yes Program Orientation Provided & Reviewed with Pt/Caregiver Including Roles  & Responsibilities: Yes  Barriers to Discharge: Insurance for SNF coverage     Decrease burden of Care through IP rehab admission: n/a     Possible need for SNF placement upon discharge: Not anticipated.  Discharge plan: return to pt's primary residence, where his sister and BIL also live and can provide 24/7 support.      Patient Condition: This patient's medical and functional status has changed since the consult dated: 09/08/23 in which the Rehabilitation Physician determined and documented that the patient's condition is appropriate for intensive rehabilitative care in an inpatient rehabilitation facility. See "History of Present Illness" (above) for medical update. Functional changes are: pt is min assist for mobility. Patient's  medical and functional status update has been discussed with the Rehabilitation physician and patient remains appropriate for inpatient rehabilitation. Will admit to inpatient rehab today.   Preadmission Screen Completed By:  Stephania Fragmin, PT, DPT 09/11/2023 10:03 AM ______________________________________________________________________   Discussed status with Dr. Shearon Stalls on 09/11/23 at 10:03 AM  and received approval for admission today.   Admission Coordinator:  Stephania Fragmin, PT, DPT time 10:03 AM Dorna Bloom 09/11/23             Revision History

## 2023-09-11 NOTE — Discharge Summary (Signed)
Physician Discharge Summary  Patient ID: ARLESS MOSCHELLA MRN: [id number] DOB/AGE: [birth year] 62 y.o.  Admit date: 09/11/2023 Discharge date: 09/24/2023  Discharge Diagnoses:  Principal Problem:   Lumbar stenosis Active Problems:   Coping style affecting medical condition Active problems:  Functional deficits secondary to lumbar stenosis Urinary retention CKD stage IV DM-2 Hypertension Anemia COPD Metabolic acidosis Tobacco use Constipation Hyperkalemia Bilateral shoulder pain abnormal imaging  Discharged Condition: stable  Significant Diagnostic Studies:  Narrative & Impression  CLINICAL DATA:  Acute on chronic right shoulder pain.   EXAM: MRI OF THE RIGHT SHOULDER WITHOUT CONTRAST   TECHNIQUE: Multiplanar, multisequence MR imaging of the shoulder was performed. No intravenous contrast was administered.   COMPARISON:  Right shoulder x-rays from yesterday.   FINDINGS: Rotator cuff: Full-thickness, full width tears of the supraspinatus and infraspinatus tendons with retraction to the glenoid. Full-thickness, partial width tear involving the superior subscapularis tendon. Intact teres minor tendon.   Muscles: Diffuse supraspinatus, infraspinatus, and subscapularis muscle edema with mild fatty infiltration. Normal teres minor muscle.   Biceps long head:  Completely torn and retracted into the upper arm.   Acromioclavicular Joint: Mild arthropathy of the acromioclavicular joint. Small amount of fluid in the subacromial/subdeltoid bursa.   Glenohumeral Joint: Small joint effusion. Scattered partial and full-thickness cartilage loss over the humeral head and glenoid.   Labrum: Diffusely degenerated and torn, sparing the inferior labrum.   Bones: High-riding humeral head. No acute fracture or dislocation. No suspicious bone lesion.   Other: None.   IMPRESSION: 1. Full-thickness, full width tears of the supraspinatus and infraspinatus tendons with  retraction to the glenoid. Full-thickness, partial width tear involving the superior subscapularis tendon. Diffuse supraspinatus, infraspinatus, and subscapularis muscle edema with mild fatty infiltration. 2. Completely torn and retracted long head biceps tendon. 3. 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     Electronically Signed   By: Obie Dredge M.D.   On: 09/16/2023 16:12    Narrative & Impression  CLINICAL DATA:  Right shoulder pain   EXAM: RIGHT SHOULDER - 2+ VIEW   COMPARISON:  None Available.   FINDINGS: Mild AC joint degenerative change. Mild glenohumeral degenerative change. Questionable cortical step-off at the right humerus head neck junction. High-riding humeral head consistent with rotator cuff disease.   IMPRESSION: 1. Questionable cortical step-off at the right humerus head neck junction, follow-up CT may be obtained if symptoms are suggestive of fracture. 2. High-riding humeral head consistent with rotator cuff disease. Degenerative changes     Electronically Signed   By: Jasmine Pang M.D.   On: 09/15/2023 21:21       Narrative & Impression  CLINICAL DATA:  Abdominal pain.   EXAM: ABDOMEN - 1 VIEW   COMPARISON:  None Available.   FINDINGS: The bowel gas pattern is normal. No radio-opaque calculi or other significant radiographic abnormality are seen. Status post surgical fusion of lumbar spine.   IMPRESSION: No abnormal bowel dilatation.     Electronically Signed   By: Lupita Raider M.D.   On: 09/12/2023 13:22    Labs:  Basic Metabolic Panel: Recent Labs  Lab 09/21/23 0519 09/23/23 0618 09/24/23 0517  NA 137 139 137  K 5.4* 5.3* 5.0  CL 109 110 109  CO2 19* 21* 21*  GLUCOSE 88 82 83  BUN 74* 69* 72*  CREATININE 4.57* 4.81* 4.85*  CALCIUM 8.7* 8.9 8.5*    CBC: Recent Labs  Lab 09/21/23 0519  WBC 7.2  HGB 9.0*  HCT 29.2*  MCV 94.8  PLT 348    CBG: Recent Labs  Lab  09/23/23 1126 09/23/23 1622 09/23/23 2112 09/24/23 0555 09/24/23 1147  GLUCAP 76 130* 125* 87 83    Brief HPI:   Curtis Hartman is a 62 y.o. male with a history of C3-C7 PCD F who developed progressive mechanical back pain and bilateral radicular symptoms refractory to medical therapy, physical therapy and pain management. He is followed by Dr. Hoyt Koch. Discussed recommendations for L2-3, L3-4, L4-5 DLIF, posterior percutaneous fusion and possible MIS posterior decompression. The patient consented and was taken to the operating room on 09/01/2023 and underwent direct lumbar interbody fusion L2-3, L3-4, L4-5, right prone Tran psoas approach, L2-L5 posterior percutaneous fusion and L4-L5 minimally invasive laminectomy and foraminotomies. He also performed L2-5 lumbar percutaneous pedicle screw. Stable postoperatively with recommendations to apply LSO when out of bed. He had acute postoperative hypertensive urgency, AKI on CKD, urinary retention and Foley was placed. Likely benign prostatic hypertrophy and placed on Flomax. TRH consulted for chronic kidney disease and elevated creatinine. Follows as an outpatient with Dr. Wolfgang Phoenix. Losartan discontinued and kidney ultrasound and urine studies ordered. Nephrology consulted. No uremic symptoms or indications for dialysis, however he is nearing need for hemodialysis and vascular surgery was consulted for creation of AV fistula. On subcutaneous heparin for DVT prophylaxis. Dr. Maurice Small discussed with him on 11/02 that his psoas weakness is expected and approach related and should improve with time. On 11/07 he was taken to the operating room where he underwent left brachiocephalic arteriovenous fistula by Dr. Hetty Blend. Was transfused 1 unit of packed red blood cells for hemoglobin of 7.0. Currently receiving Venofer.    Hospital Course: MOHAMADALI BRANTON was admitted to rehab 09/11/2023 for inpatient therapies to consist of PT, ST and OT at least three  hours five days a week. Past admission physiatrist, therapy team and rehab RN have worked together to provide customized collaborative inpatient rehab. Nurse was told by patient that he had sutures in right toe that needed removal. Reviewed chart and verified laceration repair PTA. Sutures removed. Nephrology follow-up 11/09>>signed off. Goal serum potassium <5.5 Foley catheter removed and monitored PVRs. On Flomax. Follow-up labs 11/11: BUN/Cr stable; K= 5.0, H and H stable. Complained of right shoulder pain on 11/11. There was mention in medical record of possible right biceps tear in 2022 by Dr. Hilda Lias. Shoulder x-rays obtained 11/12. Rec: CT for further evaluation and MRI right shoulder obtained on 11/13. He refused injections. Voltaren gel added. Continued to require narcotic pain medication every four hours. He receives MSIR 15 mg from [facility] last filled 09/01/2023. Resume this at discharge.   Blood pressures were monitored on TID basis and amlodipine 10 mg daily and carvedilol 3.125 mg BID continued.  Diabetes has been monitored with ac/hs CBG checks and SSI was use prn for tighter BS control. He did not require long acting insulin during rehab admission.   Rehab course: During patient's stay in rehab weekly team conferences were held to monitor patient's progress, set goals and discuss barriers to discharge. At admission, patient required max with basic self-care skills and IADL and mod assist with mobility.  He has had improvement in activity tolerance, balance, postural control as well as ability to compensate for deficits. He has had improvement in functional use RUE/LUE  and RLE/LLE as well as improvement in awareness. Patient has met 11 of 11 long term goals due to improved activity tolerance, improved balance, postural control,  ability to compensate for deficits, functional use of  RIGHT upper, RIGHT lower, LEFT upper, and LEFT lower extremity, improved awareness, and improved coordination.   Patient to discharge at overall Supervision-Mod I level.  Patient's care partner is independent to provide the necessary supervision assistance at discharge.     Outpatient PT arranged.  Disposition: home  Diet: carb modified  Special Instructions: No driving, alcohol consumption or tobacco use.  Would follow-up with his outpatient nephrologist Dr. Wolfgang Phoenix after discharge Avoid nephrotoxic medications including NSAIDs and iodinated intravenous contrast exposure unless the latter is absolutely indicated.  Preferred narcotic agents for pain control are hydromorphone, fentanyl, and methadone. Morphine should not be used.  Avoid Baclofen and avoid oral sodium phosphate and magnesium citrate based laxatives / bowel preps.    K+ 5.0- need to avoid OJ and bananas and have PCP/renal recheck labs in the next 1-2 weeks. K+ goal < 5.5  Recommend follow-up with [facility] regarding findings from imaging of shoulders and bilateral shoulder pain. Discharge Instructions     Ambulatory referral to Orthopedic Surgery   Complete by: As directed    Bilateral shoulder   Discharge patient   Complete by: As directed    Discharge disposition: 01-Home or Self Care   Discharge patient date: 09/24/2023      Allergies as of 09/24/2023   No Known Allergies      Medication List     STOP taking these medications    azithromycin 250 MG tablet Commonly known as: ZITHROMAX   insulin aspart 100 UNIT/ML injection Commonly known as: novoLOG   Lokelma 10 g Pack packet Generic drug: sodium zirconium cyclosilicate   olmesartan 5 MG tablet Commonly known as: BENICAR       TAKE these medications    acetaminophen 325 MG tablet Commonly known as: TYLENOL Take 1-2 tablets (325-650 mg total) by mouth every 4 (four) hours as needed for mild pain (pain score 1-3).   albuterol 108 (90 Base) MCG/ACT inhaler Commonly known as: ProAir HFA 2 puffs every 4 hours as needed only  if your can't  catch your breath   amLODipine 10 MG tablet Commonly known as: NORVASC Take 10 mg by mouth daily as needed (diastolic blood pressure above 90).   Benefiber Powd Mix 2 - 3 teaspoons in 8 ounces of water daily in the mornings.   Breztri Aerosphere 160-9-4.8 MCG/ACT Aero Generic drug: Budeson-Glycopyrrol-Formoterol Inhale 2 puffs into the lungs in the morning and at bedtime. What changed:  when to take this reasons to take this   calcitRIOL 0.25 MCG capsule Commonly known as: ROCALTROL Take 1 capsule (0.25 mcg total) by mouth 3 (three) times a week. Start taking on: September 25, 2023   calcium acetate 667 MG tablet Commonly known as: PHOSLO Take 1 tablet (667 mg total) by mouth 2 (two) times daily.   carvedilol 3.125 MG tablet Commonly known as: COREG Take 1 tablet (3.125 mg total) by mouth 2 (two) times daily with a meal.   Darbepoetin Alfa 60 MCG/0.3ML Sosy injection Commonly known as: ARANESP Inject 0.3 mLs (60 mcg total) into the skin every Wednesday at 6 PM. Start taking on: September 30, 2023   fluticasone 50 MCG/ACT nasal spray Commonly known as: FLONASE Place 2 sprays into both nostrils daily. What changed:  when to take this reasons to take this   gabapentin 300 MG capsule Commonly known as: NEURONTIN Take 1 capsule (300 mg total) by mouth at bedtime. What changed:  medication strength how  much to take when to take this   Lantus 100 UNIT/ML injection Generic drug: insulin glargine Inject 5 Units into the skin at bedtime as needed (blood sugar over 150).   melatonin 5 MG Tabs Take 1 tablet (5 mg total) by mouth at bedtime as needed.   morphine 15 MG tablet Commonly known as: MSIR Take 15 mg by mouth every 6 (six) hours as needed for severe pain.   sodium bicarbonate 650 MG tablet Take 1 tablet (650 mg total) by mouth 3 (three) times daily.   tamsulosin 0.4 MG Caps capsule Commonly known as: FLOMAX Take 1 capsule (0.4 mg total) by mouth daily.         Follow-up Information     Wilmon Pali, FNP Follow up.   Specialty: Family Medicine Why: Call the office in 1 to 2 days to make arrangements for hospital follow-up appointment. Contact information: [address] [place] [address] [phone]         Genice Rouge, MD Follow up.   Specialty: Physical Medicine and Rehabilitation Why: As needed Contact information: [address] [place] [address] [phone]         Daria Pastures, MD. Go to.   Specialty: Vascular Surgery Contact information: [address] [place] [address] [phone]         Randa Lynn, MD Follow up.   Specialty: Nephrology Why: Call the office in 1 to 2 days to make arrangements for hospital follow-up appointment. Contact information: [address] [place] [address] [phone]         Bedelia Person, MD Follow up.   Specialty: Neurosurgery Why: Call the office in 1-2 days to make arrangements for hospital follow-up appointment. Contact information: [address] [place] [address] [phone]         [facility]. Schedule an appointment as soon as possible for a visit.   Contact information: [address] [place] [address] [phone]                 Signed: Milinda Antis 09/24/2023, 1:25 PM

## 2023-09-11 NOTE — Progress Notes (Signed)
Patient ID: Curtis Hartman, male   DOB: 08/31/1961, 62 y.o.   MRN: [id number]  INPATIENT REHABILITATION ADMISSION NOTE   Arrival Method: bed     Mental Orientation: x4   Assessment: see flowsheet   Skin: see flowsheet   IV'S: right hand   Pain: 9/10   Tubes and Drains: foley   Safety Measures: in place   Vital Signs: see flowsheet   Height and Weight: see flowsheet   Rehab Orientation: completed   Family: notified

## 2023-09-12 ENCOUNTER — Inpatient Hospital Stay (HOSPITAL_COMMUNITY): Payer: Medicaid Other

## 2023-09-12 DIAGNOSIS — M48061 Spinal stenosis, lumbar region without neurogenic claudication: Secondary | ICD-10-CM | POA: Diagnosis not present

## 2023-09-12 DIAGNOSIS — G8918 Other acute postprocedural pain: Secondary | ICD-10-CM | POA: Diagnosis not present

## 2023-09-12 DIAGNOSIS — R339 Retention of urine, unspecified: Secondary | ICD-10-CM | POA: Diagnosis not present

## 2023-09-12 LAB — CBC WITH DIFFERENTIAL/PLATELET
Abs Immature Granulocytes: 0.11 10*3/uL — ABNORMAL HIGH (ref 0.00–0.07)
Basophils Absolute: 0.1 10*3/uL (ref 0.0–0.1)
Basophils Relative: 1 %
Eosinophils Absolute: 0.3 10*3/uL (ref 0.0–0.5)
Eosinophils Relative: 4 %
HCT: 23.8 % — ABNORMAL LOW (ref 39.0–52.0)
Hemoglobin: 7.8 g/dL — ABNORMAL LOW (ref 13.0–17.0)
Immature Granulocytes: 1 %
Lymphocytes Relative: 18 %
Lymphs Abs: 1.7 10*3/uL (ref 0.7–4.0)
MCH: 30 pg (ref 26.0–34.0)
MCHC: 32.8 g/dL (ref 30.0–36.0)
MCV: 91.5 fL (ref 80.0–100.0)
Monocytes Absolute: 0.9 10*3/uL (ref 0.1–1.0)
Monocytes Relative: 9 %
Neutro Abs: 6.4 10*3/uL (ref 1.7–7.7)
Neutrophils Relative %: 67 %
Platelets: 327 10*3/uL (ref 150–400)
RBC: 2.6 MIL/uL — ABNORMAL LOW (ref 4.22–5.81)
RDW: 13.4 % (ref 11.5–15.5)
WBC: 9.4 10*3/uL (ref 4.0–10.5)
nRBC: 0 % (ref 0.0–0.2)

## 2023-09-12 LAB — COMPREHENSIVE METABOLIC PANEL
ALT: 7 U/L (ref 0–44)
AST: 12 U/L — ABNORMAL LOW (ref 15–41)
Albumin: 2.1 g/dL — ABNORMAL LOW (ref 3.5–5.0)
Alkaline Phosphatase: 42 U/L (ref 38–126)
Anion gap: 9 (ref 5–15)
BUN: 85 mg/dL — ABNORMAL HIGH (ref 8–23)
CO2: 22 mmol/L (ref 22–32)
Calcium: 8.7 mg/dL — ABNORMAL LOW (ref 8.9–10.3)
Chloride: 106 mmol/L (ref 98–111)
Creatinine, Ser: 4.85 mg/dL — ABNORMAL HIGH (ref 0.61–1.24)
GFR, Estimated: 13 mL/min — ABNORMAL LOW (ref >60)
Glucose, Bld: 98 mg/dL (ref 70–99)
Potassium: 5.2 mmol/L — ABNORMAL HIGH (ref 3.5–5.1)
Sodium: 137 mmol/L (ref 135–145)
Total Bilirubin: 0.4 mg/dL (ref <1.2)
Total Protein: 5 g/dL — ABNORMAL LOW (ref 6.5–8.1)

## 2023-09-12 LAB — GLUCOSE, CAPILLARY
Glucose-Capillary: 97 mg/dL (ref 70–99)
Glucose-Capillary: 117 mg/dL — ABNORMAL HIGH (ref 70–99)
Glucose-Capillary: 119 mg/dL — ABNORMAL HIGH (ref 70–99)
Glucose-Capillary: 132 mg/dL — ABNORMAL HIGH (ref 70–99)

## 2023-09-12 MED ORDER — OXYCODONE HCL 5 MG PO TABS
5.0000 mg | ORAL_TABLET | Freq: Three times a day (TID) | ORAL | Status: DC
  Administered 2023-09-12: 5 mg via ORAL
  Filled 2023-09-12 (×2): qty 1

## 2023-09-12 MED ORDER — SORBITOL 70 % SOLN
30.0000 mL | Freq: Every day | Status: DC | PRN
  Administered 2023-09-13: 30 mL via ORAL
  Filled 2023-09-12: qty 30

## 2023-09-12 MED ORDER — GABAPENTIN 300 MG PO CAPS
300.0000 mg | ORAL_CAPSULE | Freq: Every day | ORAL | Status: DC
  Administered 2023-09-13 – 2023-09-23 (×11): 300 mg via ORAL
  Filled 2023-09-12 (×11): qty 1

## 2023-09-12 NOTE — Plan of Care (Signed)
  Problem: RH Balance Goal: LTG: Patient will maintain dynamic sitting balance (OT) Description: LTG:  Patient will maintain dynamic sitting balance with assistance during activities of daily living (OT) Flowsheets (Taken 09/12/2023 1538) LTG: Pt will maintain dynamic sitting balance during ADLs with: Independent with assistive device Goal: LTG Patient will maintain dynamic standing with ADLs (OT) Description: LTG:  Patient will maintain dynamic standing balance with assist during activities of daily living (OT)  Flowsheets (Taken 09/12/2023 1538) LTG: Pt will maintain dynamic standing balance during ADLs with: Supervision/Verbal cueing   Problem: Sit to Stand Goal: LTG:  Patient will perform sit to stand in prep for activites of daily living with assistance level (OT) Description: LTG:  Patient will perform sit to stand in prep for activites of daily living with assistance level (OT) Flowsheets (Taken 09/12/2023 1538) LTG: PT will perform sit to stand in prep for activites of daily living with assistance level: Supervision/Verbal cueing   Problem: RH Grooming Goal: LTG Patient will perform grooming w/assist,cues/equip (OT) Description: LTG: Patient will perform grooming with assist, with/without cues using equipment (OT) Flowsheets (Taken 09/12/2023 1538) LTG: Pt will perform grooming with assistance level of: Independent with assistive device    Problem: RH Bathing Goal: LTG Patient will bathe all body parts with assist levels (OT) Description: LTG: Patient will bathe all body parts with assist levels (OT) Flowsheets (Taken 09/12/2023 1538) LTG: Pt will perform bathing with assistance level/cueing: Supervision/Verbal cueing   Problem: RH Dressing Goal: LTG Patient will perform upper body dressing (OT) Description: LTG Patient will perform upper body dressing with assist, with/without cues (OT). Flowsheets (Taken 09/12/2023 1538) LTG: Pt will perform upper body dressing with assistance  level of: Independent with assistive device Goal: LTG Patient will perform lower body dressing w/assist (OT) Description: LTG: Patient will perform lower body dressing with assist, with/without cues in positioning using equipment (OT) Flowsheets (Taken 09/12/2023 1538) LTG: Pt will perform lower body dressing with assistance level of: Supervision/Verbal cueing   Problem: RH Toileting Goal: LTG Patient will perform toileting task (3/3 steps) with assistance level (OT) Description: LTG: Patient will perform toileting task (3/3 steps) with assistance level (OT)  Flowsheets (Taken 09/12/2023 1538) LTG: Pt will perform toileting task (3/3 steps) with assistance level: Supervision/Verbal cueing   Problem: RH Functional Use of Upper Extremity Goal: LTG Patient will use RT/LT upper extremity as a (OT) Description: LTG: Patient will use right/left upper extremity as a stabilizer/gross assist/diminished/nondominant/dominant level with assist, with/without cues during functional activity (OT) Flowsheets (Taken 09/12/2023 1538) LTG: Use of upper extremity in functional activities:  RUE as diminished level  LUE as diminished level LTG: Pt will use upper extremity in functional activity with assistance level of: Set up assist   Problem: RH Toilet Transfers Goal: LTG Patient will perform toilet transfers w/assist (OT) Description: LTG: Patient will perform toilet transfers with assist, with/without cues using equipment (OT) Flowsheets (Taken 09/12/2023 1538) LTG: Pt will perform toilet transfers with assistance level of: Supervision/Verbal cueing   Problem: RH Tub/Shower Transfers Goal: LTG Patient will perform tub/shower transfers w/assist (OT) Description: LTG: Patient will perform tub/shower transfers with assist, with/without cues using equipment (OT) Flowsheets (Taken 09/12/2023 1538) LTG: Pt will perform tub/shower stall transfers with assistance level of: Supervision/Verbal cueing

## 2023-09-12 NOTE — Progress Notes (Signed)
PROGRESS NOTE   Subjective/Complaints:  Pt asking about d/c date.  Discussed CIR process, Nephro also in room, theyt are signing off but remain available if needed   ROS- + abd pain , BM x 2 yesterdy , has catheter , no breathing issues or CP   Objective:   No results found. Recent Labs    09/11/23 0612 09/12/23 0515  WBC 10.6* 9.4  HGB 7.9* 7.8*  HCT 24.4* 23.8*  PLT 317 327   Recent Labs    09/11/23 0612 09/12/23 0515  NA 139 137  K 5.3* 5.2*  CL 107 106  CO2 22 22  GLUCOSE 106* 98  BUN 85* 85*  CREATININE 5.07* 4.85*  CALCIUM 7.7* 8.7*    Intake/Output Summary (Last 24 hours) at 09/12/2023 0810 Last data filed at 09/12/2023 0730 Gross per 24 hour  Intake 1000 ml  Output 1500 ml  Net -500 ml        Physical Exam: Vital Signs Blood pressure 130/73, pulse 69, temperature 97.8 F (36.6 C), temperature source Oral, resp. rate 18, height 5\' 9"  (1.753 m), weight 79.5 kg, SpO2 99%.   General: No acute distress Mood and affect are appropriate Heart: Regular rate and rhythm no rubs murmurs or extra sounds Lungs: Clear to auscultation, breathing unlabored, no rales or wheezes Abdomen: Positive bowel sounds, soft nontender to palpation, nondistended Extremities: No clubbing, cyanosis, or edema Skin: No evidence of breakdown, no evidence of rash   Neurologic: Cranial nerves II through XII intact, motor strength is 5/5 in bilateral deltoid, bicep, tricep, grip,4/5 B  hip flexor, knee extensors, ankle dorsiflexor and plantar flexor Sensory exam normal sensation to light touch and proprioception in bilateral upper and lower extremities R>L hand intrinsic atrophy  Musculoskeletal: Full range of motion in all 4 extremities. No joint swelling   Assessment/Plan: 1. Functional deficits which require 3+ hours per day of interdisciplinary therapy in a comprehensive inpatient rehab setting. Physiatrist is providing  close team supervision and 24 hour management of active medical problems listed below. Physiatrist and rehab team continue to assess barriers to discharge/monitor patient progress toward functional and medical goals  Care Tool:  Bathing              Bathing assist       Upper Body Dressing/Undressing Upper body dressing        Upper body assist      Lower Body Dressing/Undressing Lower body dressing            Lower body assist       Toileting Toileting    Toileting assist       Transfers Chair/bed transfer  Transfers assist           Locomotion Ambulation   Ambulation assist              Walk 10 feet activity   Assist           Walk 50 feet activity   Assist           Walk 150 feet activity   Assist           Walk 10 feet on uneven surface  activity   Assist           Wheelchair     Assist               Wheelchair 50 feet with 2 turns activity    Assist            Wheelchair 150 feet activity     Assist          Blood pressure 130/73, pulse 69, temperature 97.8 F (36.6 C), temperature source Oral, resp. rate 18, height 5\' 9"  (1.753 m), weight 79.5 kg, SpO2 99%.  Medical Problem List and Plan: 1. Functional deficits secondary to lumbar stenosis with radiculopathy s/p  L2-L5 DLIF with instrumentation 10/29 with Dr. Maisie Fus             -patient may shower             -ELOS/Goals: 10-14 days, Mod I PT/OT              - Stable for CIR admission   2.  Antithrombotics: -DVT/anticoagulation:  Pharmaceutical: Heparin             -antiplatelet therapy: none   3. Pain Management: Tylenol, Robaxin, oxycodone as needed             -Continue gabapentin 100 mg twice daily   4. Mood/Behavior/Sleep: LCSW to evaluate and provide emotional support             -antipsychotic agents: n/a   5. Neuropsych/cognition: This patient is capable of making decisions on his own behalf.   6.  Skin/Wound Care: Routine skin care checks              - R toe sutures removed on admission, WOC order for betadyne BID   7. Fluids/Electrolytes/Nutrition: Strict Is and Os and follow-up chemistries   8: Hypertension: monitor TID and prn; appears stable on oral agents             -continue amlodipine 10 mg daily             -continue carvedilol 3.125 mg twice daily Vitals:   09/11/23 2012 09/12/23 0434  BP: 135/76 130/73  Pulse: 72 69  Resp: 18 18  Temp: 98 F (36.7 C) 97.8 F (36.6 C)  SpO2: 100% 99%  Controlled 11/9    9: Diabetes mellitus: CBGs 4 times daily; A1c = 5.7%             -continue sliding scale insulin 0 to 15 units             -? resume home Lantus 5 units daily    10: Urinary retention, acute, h/o BPH:              -had indwelling Foley catheter -continue Flomax 0.4 mg daily  -Foley placed 10/30; DC foley trial with ISC Q6H for PVR >350 ccs to start in AM 11/9   11: AKI/CKD stage IV: Nearing need for hemodialysis             -Nephrology signed off, he has had LUE AV fistula created 11/7 (follow-up arranged with [facility])- pt will f/u with Dr Wolfgang Phoenix as OP              -On Calcitrol, PhosLo    12: Acute on chronic anemia of renal/medical disease:             -follow-up CBC             -continue  ESA weekly -continue IV iron (x 2 more doses)   13: COPD: Continue Incruse Ellipta daily and Breo Ellipta daily   14: Metabolic acidosis: continue sodium bicarb 650 mg 3 times daily  - Per nephrology continue home dose   15: Loose stools/? IBS: getting Imodium prn   16: Right 4th toe laceration PTA: well-healed, discontinue sutures.    17. ? R biceps tear - documented per record in 2022 by Dr. Hilda Lias at [place]. Likely lost to follow up; would re-establish as OP.    18. Hx cervical stenosis s/p C4-T1 PCDF 2022, then C4/5 ACDF 11/2022 with Dr. Maisie Fus. Pain regimen as above.     19.  Mild hyperK+ due to CKD     LOS: 1 days A FACE TO FACE EVALUATION WAS  PERFORMED  Erick Colace 09/12/2023, 8:10 AM

## 2023-09-12 NOTE — Evaluation (Signed)
Physical Therapy Assessment and Plan  Patient Details  Name: Curtis Hartman MRN: [id number] Date of Birth: 19-Jul-1961  PT Diagnosis: {diagnoses:3041673} Rehab Potential: Fair ELOS: 10-14 days   Today's Date: 09/12/2023 PT Individual Time: 1324-4010 PT Individual Time Calculation (min): 73 min    Hospital Problem: Principal Problem:   Lumbar stenosis   Past Medical History:  Past Medical History:  Diagnosis Date   Arthritis    CKD (chronic kidney disease)    COPD (chronic obstructive pulmonary disease) (HCC)    Diabetes mellitus without complication (HCC)    type 2   GERD (gastroesophageal reflux disease)    Headache    Hx Migraines   Hepatitis    Hypertension    Neuropathy    Past Surgical History:  Past Surgical History:  Procedure Laterality Date   ANTERIOR CERVICAL DECOMP/DISCECTOMY FUSION N/A 11/05/2022   Procedure: Anterior Cervical Decompression/discectomy Fusion Cervical Four-Cervical Five;  Surgeon: Bedelia Person, MD;  Location: [facility] OR;  Service: Neurosurgery;  Laterality: N/A;  3C   ANTERIOR LAT LUMBAR FUSION N/A 09/01/2023   Procedure: DIRECT LUMBAR INTERBODY FUSION, LUMBAR TWO-LUMBAR THREE, LUMBAR THREE-LUMBAR FOUR, LUMBAR FOUR-LUMBAR FIVE, RIGHT PRONE TRANSPSOAS, LUMBAR TWO LUMBAR FIVE POSTERIOR PERCUTANEOUS FUSION LUMBAR FOUR-LUMBAR FIVE MINIMALLY INVASIVE LAMINECTOMY, FORAMINOTOMY;  Surgeon: Bedelia Person, MD;  Location: [facility] OR;  Service: Neurosurgery;  Laterality: N/A;   AV FISTULA PLACEMENT Left 09/10/2023   Procedure: LEFT ARM ARTERIOVENOUS (AV) FISTULA CREATION;  Surgeon: Daria Pastures, MD;  Location: [facility] OR;  Service: Vascular;  Laterality: Left;   BACK SURGERY  2023   Cervical Spine   BIOPSY  03/03/2022   Procedure: BIOPSY;  Surgeon: Lanelle Bal, DO;  Location: [facility] SUITE;  Service: Endoscopy;;   COLONOSCOPY WITH PROPOFOL N/A 03/03/2022   Procedure: COLONOSCOPY WITH PROPOFOL;  Surgeon: Lanelle Bal, DO;  Location: [facility]  SU[facility];  Service: Endoscopy;  Laterality: N/A;  8:00am   ESOPHAGOGASTRODUODENOSCOPY (EGD) WITH PROPOFOL N/A 03/03/2022   Procedure: ESOPHAGOGASTRODUODENOSCOPY (EGD) WITH PROPOFOL;  Surgeon: Lanelle Bal, DO;  Location: [facility];  Service: Endoscopy;  Laterality: N/A;   foot right partial toe amputation Right    FOOT SURGERY Right    x3   hernia surgery x4     LUMBAR PERCUTANEOUS PEDICLE SCREW 3 LEVEL N/A 09/01/2023   Procedure: L2-5 LUMBAR PERCUTANEOUS PEDICLE SCREW 3 LEVEL;  Surgeon: Bedelia Person, MD;  Location: [facility] OR;  Service: Neurosurgery;  Laterality: N/A;   POLYPECTOMY  03/03/2022   Procedure: POLYPECTOMY;  Surgeon: Lanelle Bal, DO;  Location: [facility];  Service: Endoscopy;;    Assessment & Plan Clinical Impression: Patient is a 62 y.o. year old male with recent admission to the hospital on *** with ***.  Patient transferred to CIR on 09/11/2023 .   Patient currently requires {UVO:5366440} with mobility secondary to {impairments:3041632}.  Prior to hospitalization, patient was {HKV:4259563} with mobility and lived with Family (younger sister and her boyfriend) in a House home.  Home access is 2Stairs to enter.  Patient will benefit from skilled PT intervention to {benefits:22816} for planned discharge {planned discharge:3041670}.  Anticipate patient will {follow OV:5643329} at discharge.  PT - End of Session Activity Tolerance: Tolerates < 10 min activity with changes in vital signs Endurance Deficit: Yes PT Assessment Rehab Potential (ACUTE/IP ONLY): Fair PT Barriers to Discharge: Inaccessible home environment;Decreased caregiver support;Home environment access/layout;Incontinence;Wound Care;Lack of/limited family support;Insurance for SNF coverage;Medication compliance;Nutrition means;Behavior PT Patient demonstrates impairments in the following area(s):  Balance;Behavior;Endurance;Motor;Pain;Sensory;Skin Integrity;Safety PT Transfers Functional Problem(s):  Bed Mobility;Bed to Chair;Car;Furniture PT Locomotion Functional Problem(s): Ambulation;Stairs PT Plan PT Intensity: Minimum of 1-2 x/day ,45 to 90 minutes PT Frequency: 5 out of 7 days PT Duration Estimated Length of Stay: 10-14 days PT Treatment/Interventions: Ambulation/gait training;Community reintegration;DME/adaptive equipment instruction;Neuromuscular re-education;Psychosocial support;Stair training;UE/LE Strength taining/ROM;Wheelchair propulsion/positioning;UE/LE Coordination activities;Therapeutic Activities;Skin care/wound management;Pain management;Functional electrical stimulation;Discharge planning;Balance/vestibular training;Cognitive remediation/compensation;Disease management/prevention;Functional mobility training;Patient/family education;Splinting/orthotics;Therapeutic Exercise;Visual/perceptual remediation/compensation PT Transfers Anticipated Outcome(s): CGA PT Locomotion Anticipated Outcome(s): CGA/ MinA PT Recommendation Recommendations for Other Services: Therapeutic Recreation consult Therapeutic Recreation Interventions: Stress management;Outing/community reintergration Follow Up Recommendations: Home health PT;24 hour supervision/assistance Patient destination: Home Equipment Recommended: To be determined   PT Evaluation Precautions/Restrictions Precautions Precautions: Back;Fall Precaution Comments: decreased sensation in RLE Required Braces or Orthoses: Spinal Brace Spinal Brace: Lumbar corset;Applied in sitting position Restrictions Weight Bearing Restrictions: No General   Vital Signs  Pain Pain Assessment Pain Scale: 0-10 Pain Score: 8  Pain Type: Surgical pain Pain Location: Back Pain Orientation: Lower Pain Descriptors / Indicators: Aching;Discomfort;Sore;Sharp Pain Onset: On-going Patients Stated Pain Goal: 0 Pain Intervention(s): Medication (See eMAR);Repositioned Pain Interference Pain Interference Pain Effect on Sleep: 3. Frequently Pain  Interference with Therapy Activities: 3. Frequently Pain Interference with Day-to-Day Activities: 3. 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 Leisure: Hobbies-yes (Comment) (coffe, smoking, tv, fishing) Vision/Perception  Vision - History Ability to See in Adequate Light: 0 Adequate Perception Perception: Within Functional Limits Praxis Praxis: WFL  Cognition Overall Cognitive Status: Within Functional Limits for tasks assessed Arousal/Alertness: Awake/alert Orientation Level: Oriented X4 Memory: Impaired Memory Impairment: Decreased recall of new information;Decreased short term memory Awareness: Appears intact Problem Solving: Appears intact Behaviors: Poor frustration tolerance (d/t high pain levels) Safety/Judgment: Appears intact Sensation Sensation Light Touch: Impaired by gross assessment Light Touch Impaired Details: Impaired RLE Coordination Gross Motor Movements are Fluid and Coordinated: No Fine Motor Movements are Fluid and Coordinated: No Coordination and Movement Description: balance and coord deficits due to weakness, sensation, and pain Heel Shin Test: unable d/t pain Motor  Motor Motor: Other (comment) Motor - Skilled Clinical Observations: UE/LE weakness, balance and coord deficits d/t spinal surgery   Trunk/Postural Assessment  Cervical Assessment Cervical Assessment:  Exceptions to [facility] (forward head) Cervical Strength Overall Cervical Strength: Due to pain Thoracic Assessment Thoracic Assessment: Exceptions to [facility] (rounded shoulders) Lumbar Assessment Lumbar Assessment: Exceptions to [facility] (reduced lordosis) Postural Control Postural Control: Deficits on evaluation Righting Reactions: slowed Protective Responses: diminshed Postural Limitations: forward head, loss of lumbar curve  Balance Balance Balance Assessed: Yes Static Sitting Balance Static Sitting - Balance Support: Feet supported;Bilateral upper extremity supported Static Sitting - Level of Assistance: 5: Stand by assistance Dynamic Sitting Balance Dynamic Sitting - Balance Support: Feet supported;Bilateral upper extremity supported Dynamic Sitting - Level of Assistance: 4: Min assist Static Standing Balance Static Standing - Balance Support: During functional activity Static Standing - Level of Assistance: 3: Mod assist Dynamic Standing Balance Dynamic Standing - Balance Support: Bilateral upper extremity supported;During functional activity Dynamic Standing - Level of Assistance: 3: Mod  assist Extremity Assessment      RLE Assessment RLE Assessment: Exceptions to [facility] RLE Strength RLE Overall Strength: Deficits Right Hip Flexion: 3+/5 Right Hip Extension: 3+/5 Right Hip ABduction: 4-/5 Right Hip ADduction: 4-/5 Right Knee Flexion: 3/5 Right Knee Extension: 3-/5 Right Ankle Dorsiflexion: 3-/5 Right Ankle Plantar Flexion: 3-/5 LLE Assessment LLE Assessment: Exceptions to [facility] LLE Strength Left Hip Flexion: 4-/5 Left Hip Extension: 4/5 Left Hip ABduction: 4/5 Left Hip ADduction: 4/5 Left Knee Flexion: 4-/5 Left Knee Extension: 4/5 Left Ankle Dorsiflexion: 4-/5 Left Ankle Plantar Flexion: 4-/5  Care Tool Care Tool Bed Mobility Roll left and right activity   Roll left and right assist level: Contact Guard/Touching assist    Sit to lying activity   Sit to lying assist  level: Minimal Assistance - Patient > 75%    Lying to sitting on side of bed activity   Lying to sitting on side of bed assist level: the ability to move from lying on the back to sitting on the side of the bed with no back support.: Moderate Assistance - Patient 50 - 74%     Care Tool Transfers Sit to stand transfer   Sit to stand assist level: Moderate Assistance - Patient 50 - 74%    Chair/bed transfer   Chair/bed transfer assist level: Moderate Assistance - Patient 50 - 74%    Car transfer Car transfer activity did not occur: Safety/medical concerns        Care Tool Locomotion Ambulation Ambulation activity did not occur: Safety/medical concerns        Walk 10 feet activity Walk 10 feet activity did not occur: Safety/medical concerns       Walk 50 feet with 2 turns activity Walk 50 feet with 2 turns activity did not occur: Safety/medical concerns      Walk 150 feet activity Walk 150 feet activity did not occur: Safety/medical concerns      Walk 10 feet on uneven surfaces activity Walk 10 feet on uneven surfaces activity did not occur: Safety/medical concerns      Stairs Stair activity did not occur: Safety/medical concerns        Walk up/down 1 step activity Walk up/down 1 step or curb (drop down) activity did not occur: Safety/medical concerns      Walk up/down 4 steps activity Walk up/down 4 steps activity did not occur: Safety/medical concerns      Walk up/down 12 steps activity Walk up/down 12 steps activity did not occur: Safety/medical concerns      Pick up small objects from floor Pick up small object from the floor (from standing position) activity did not occur: Safety/medical concerns      Wheelchair Is the patient using a wheelchair?: Yes Type of Wheelchair: Manual   Wheelchair assist level: Dependent - Patient 0% Max wheelchair distance: 10 ft  Wheel 50 feet with 2 turns activity   Assist Level: Dependent - Patient 0%  Wheel 150 feet activity    Assist Level: Dependent - Patient 0%    Refer to Care Plan for Long Term Goals  SHORT TERM GOAL WEEK 1 PT Short Term Goal 1 (Week 1): Pt will perform bed mobility with overall CGA/ minA using bed features. PT Short Term Goal 2 (Week 1): Pt will perform sit<>stand transfers with overall CGA. PT Short Term Goal 3 (Week 1): Pt will perform stand pivot transfers with overall MinA. PT Short Term Goal 4 (Week 1): Pt will initiate gait training. PT Short Term  Goal 5 (Week 1): Pt will initiate w/c mobility training.  Recommendations for other services: {RECOMMENDATIONS FOR OTHER SERVICES:3049016}  Skilled Therapeutic Intervention Mobility Bed Mobility Bed Mobility: Rolling Right;Rolling Left;Left Sidelying to Sit;Supine to Sit;Sit to Supine;Right Sidelying to Sit Rolling Right: Contact Guard/Touching assist Rolling Left: Contact Guard/Touching assist Right Sidelying to Sit: Moderate Assistance - Patient 50-74% Left Sidelying to Sit: Minimal Assistance - Patient >75% Supine to Sit: Minimal Assistance - Patient > 75% Sit to Supine: Minimal Assistance - Patient > 75% Transfers Transfers: Sit to [employer] Transfers Sit to Stand: Moderate Assistance - Patient 50-74% Stand Pivot Transfers: Moderate Assistance - Patient 50 - 74% Transfer (Assistive device): 4-wheeled walker Locomotion  Gait Ambulation: No Gait Gait: No Stairs / Additional Locomotion Stairs: No Wheelchair Mobility Wheelchair Mobility: No  Skilled Intervention: PT Evaluation completed; see above for results. PT educated patient in roles of PT vs OT, PT POC, rehab potential, rehab goals, and discharge recommendations along with recommendation for follow-up rehabilitation services. Individual treatment initiated:  Patient *** in *** upon PT arrival. Patient alert and agreeable to PT session. No pain complaint during session.  Therapeutic Activity: Bed Mobility: Patient performed supine to/from sit with ***.  Provided verbal cues for ***. Transfers: Patient performed sit <> stand x*** with ***. Provided vc/tc for***.  Gait Training:  Patient ambulated *** feet using *** with ***. Ambulated with ***. Provided vc/tc for ***.  Wheelchair Mobility:  Patient propelled wheelchair *** feet with ***. Provided vc/tc for ***  Neuromuscular Re-ed: NMR facilitated during session with focus on***. Pt guided in ***. NMR performed for improvements in motor control and coordination, balance, sequencing, judgement, and self confidence/ efficacy in performing all aspects of mobility at highest level of independence.     Patient ***  in *** at end of session with brakes locked, *** alarm set, and all needs within reach.    Discharge Criteria: Patient will be discharged from PT if patient refuses treatment 3 consecutive times without medical reason, if treatment goals not met, if there is a change in medical status, if patient makes no progress towards goals or if patient is discharged from hospital.  The above assessment, treatment plan, treatment alternatives and goals were discussed and mutually agreed upon: by patient  Loel Dubonnet PT, DPT, CSRS 09/12/2023, 7:42 PM

## 2023-09-12 NOTE — Progress Notes (Signed)
Patient ID: Curtis Hartman, male   DOB: Mar 18, 1961, 62 y.o.   MRN: [id number] S: Patient has some abdominal pain.  No other complaints today.  Creatinine slightly better at 4.9 O:BP 130/73 (BP Location: Right Arm)   Pulse 70   Temp 97.8 F (36.6 C) (Oral)   Resp 18   Ht 5\' 9"  (1.753 m)   Wt 79.5 kg   SpO2 99%   BMI 25.88 kg/m   Intake/Output Summary (Last 24 hours) at 09/12/2023 0925 Last data filed at 09/12/2023 0730 Gross per 24 hour  Intake 1000 ml  Output 1500 ml  Net -500 ml   Intake/Output: I/O last 3 completed shifts: In: 720 [P.O.:720] Out: 1500 [Urine:1500]  Intake/Output this shift:  Total I/O In: 280 [P.O.:280] Out: -  Weight change:  Gen:NAD lying in bed CVS: Normal rate, no rub Resp: Bilateral chest rise with no increased work of breathing Abd: +BS, soft, TN/ND Ext: no edema  Recent Labs  Lab 09/06/23 0317 09/07/23 0751 09/08/23 0634 09/09/23 0554 09/10/23 0749 09/10/23 1057 09/11/23 0612 09/12/23 0515  NA 140 139 136 137 139 139 139 137  K 3.8 4.0 4.6 4.9 4.7 4.8 5.3* 5.2*  CL 110 110 107 107 105 104 107 106  CO2 20* 19* 21* 22 24  --  22 22  GLUCOSE 128* 121* 115* 131* 105* 99 106* 98  BUN 82* 81* 83* 92* 89* 92* 85* 85*  CREATININE 5.19* 5.03* 5.11* 5.27* 4.82* 4.80* 5.07* 4.85*  ALBUMIN  --   --   --   --  2.2*  --  2.2* 2.1*  CALCIUM 8.0* 8.0* 8.1* 8.0* 8.3*  --  7.7* 8.7*  PHOS  --   --   --   --  5.4*  --  5.6*  --   AST  --   --   --   --   --   --   --  12*  ALT  --   --   --   --   --   --   --  7   Liver Function Tests: Recent Labs  Lab 09/10/23 0749 09/11/23 0612 09/12/23 0515  AST  --   --  12*  ALT  --   --  7  ALKPHOS  --   --  42  BILITOT  --   --  0.4  PROT  --   --  5.0*  ALBUMIN 2.2* 2.2* 2.1*   No results for input(s): "LIPASE", "AMYLASE" in the last 168 hours. No results for input(s): "AMMONIA" in the last 168 hours. CBC: Recent Labs  Lab 09/09/23 2038 09/10/23 0749 09/10/23 1057 09/11/23 0612  09/12/23 0515  WBC 10.9* 8.9  --  10.6* 9.4  NEUTROABS  --   --   --   --  6.4  HGB 7.6* 7.0* 7.8* 7.9* 7.8*  HCT 23.6* 21.9* 23.0* 24.4* 23.8*  MCV 90.8 90.9  --  90.7 91.5  PLT 327 307  --  317 327   Cardiac Enzymes: No results for input(s): "CKTOTAL", "CKMB", "CKMBINDEX", "TROPONINI" in the last 168 hours. CBG: Recent Labs  Lab 09/11/23 0733 09/11/23 1128 09/11/23 1629 09/11/23 2101 09/12/23 0616  GLUCAP 132* 111* 111* 158* 97    Iron Studies:  Recent Labs    09/10/23 0749  IRON 45  TIBC 195*   Studies/Results: No results found.  amLODipine  10 mg Oral Daily   [START ON 09/14/2023] calcitRIOL  0.25 mcg Oral  Once per day on Monday Wednesday Friday   calcium acetate  667 mg Oral BID WC   carvedilol  3.125 mg Oral BID WC   Chlorhexidine Gluconate Cloth  6 each Topical Daily   [START ON 09/16/2023] darbepoetin (ARANESP) injection - NON-DIALYSIS  60 mcg Subcutaneous Q Wed-1800   docusate sodium  100 mg Oral BID   fluticasone furoate-vilanterol  1 puff Inhalation Daily   And   umeclidinium bromide  1 puff Inhalation Daily   gabapentin  400 mg Oral BID   heparin injection (subcutaneous)  5,000 Units Subcutaneous Q8H   insulin aspart  0-15 Units Subcutaneous TID WC   insulin aspart  0-5 Units Subcutaneous QHS   sodium bicarbonate  650 mg Oral TID   sodium chloride flush  3 mL Intravenous Q12H   tamsulosin  0.4 mg Oral Daily    BMET    Component Value Date/Time   NA 137 09/12/2023 0515   K 5.2 (H) 09/12/2023 0515   CL 106 09/12/2023 0515   CO2 22 09/12/2023 0515   GLUCOSE 98 09/12/2023 0515   BUN 85 (H) 09/12/2023 0515   CREATININE 4.85 (H) 09/12/2023 0515   CREATININE 2.78 (H) 11/27/2022 1317   CALCIUM 8.7 (L) 09/12/2023 0515   GFRNONAA 13 (L) 09/12/2023 0515   CBC    Component Value Date/Time   WBC 9.4 09/12/2023 0515   RBC 2.60 (L) 09/12/2023 0515   HGB 7.8 (L) 09/12/2023 0515   HCT 23.8 (L) 09/12/2023 0515   PLT 327 09/12/2023 0515   MCV 91.5  09/12/2023 0515   MCH 30.0 09/12/2023 0515   MCHC 32.8 09/12/2023 0515   RDW 13.4 09/12/2023 0515   LYMPHSABS 1.7 09/12/2023 0515   MONOABS 0.9 09/12/2023 0515   EOSABS 0.3 09/12/2023 0515   BASOSABS 0.1 09/12/2023 0515    Pt with PMH significant for COPD, HTN, DM type II, IBS, multi-level DDD, and 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 Wolfgang Phoenix) who presented for lumbar interbody fusion of L2-L3, L3-L4, L4-L5 on 09/01/23.  His hospital course was complicated by AKI/CKD stage IV and we were consulted to further evaluate and manage his AKI.  He has had progressive CKD and was told by Dr. Wolfgang Phoenix that he was nearing the need for dialysis at his last visit 2 weeks ago.  He denies any N/V/dysgeusia, anorexia, lower extremity edema.   Assessment and plan: AKI/CKD stage IV - likely multifactorial with contrast nephropathy, ischemic ATN, ABLA with concomitant ARB therapy, acute urinary retention.  Creatinine overall stable around 5.  Has not needed dialysis at this time.  Given the patient's stability we will sign off.  Continue to monitor labs weekly.  We are available if needed.  Would follow-up with his outpatient nephrologist Dr. Wolfgang Phoenix after discharge Avoid nephrotoxic medications including NSAIDs and iodinated intravenous contrast exposure unless the latter is absolutely indicated.   Preferred narcotic agents for pain control are hydromorphone, fentanyl, and methadone. Morphine should not be used.  Avoid Baclofen and avoid oral sodium phosphate and magnesium citrate based laxatives / bowel preps.  Continue strict Input and Output monitoring. Will monitor the patient closely with you and intervene or adjust therapy as indicated by changes in clinical status/labs  Vascular access He is nearing the need for HD and underwent AVF creation on 09/10/2023.  Appreciate help from vascular surgery Acute urinary retention - improved  UOP after foley catheter placed.  Currently  on flomax.  Continue with foley catheter as long as it is needed HTN/Volume - stable for now. ABLA - follow H/H and transfuse for hgb <7.  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  Now with hyperphosphatemia.  Continue with phoslo qac and follow. DM type II - per primary Lumbar stenosis - s/p lumbar spine fusion.  Per Neurosurgery. Metabolic acidosis - continue with home sodium bicarb dose and follow.   Hyperkalemia: Mild.  Goal would be less than 5.5.  Lokelma if needed  In rehab with CIR

## 2023-09-12 NOTE — Progress Notes (Signed)
Per order, urethral catheter was removed at 9 in the morning. Patient was apprehensive upon removal but agreed with purpose and expectations going forward.   Patient tolerated well, no bleeding or c/o of discomfort after f/c discontinuation.   Call-light is within reach. Bed locked and alarm on. Other safety measures implemented.

## 2023-09-12 NOTE — Progress Notes (Signed)
Ok to reduce gabapentin to 300mg  at bedtime due his poor renal function per Dr. Wynn Banker.   Ulyses Southward, PharmD, BCIDP, AAHIVP, CPP Infectious Disease Pharmacist 09/12/2023 12:32 PM

## 2023-09-12 NOTE — Progress Notes (Signed)
Physical Therapy Note  Patient Details  Name: RIKI GLADIN MRN: [id number] Date of Birth: 07/19/1961 Today's Date: 09/12/2023    Pt presents supine in bed and c/o excruciating pain to LB and HA.  Pt states just received pain meds and requests no therapy at this time.  Pt remained in bed w/ all needs in reach.  Missed time of 45 min skilled PT.   Lucio Edward 09/12/2023, 3:07 PM

## 2023-09-12 NOTE — Plan of Care (Signed)
  Problem: Consults Goal: RH SPINAL CORD INJURY PATIENT EDUCATION Description:  See Patient Education module for education specifics.  Outcome: Progressing   Problem: SCI BOWEL ELIMINATION Goal: RH STG MANAGE BOWEL WITH ASSISTANCE Description: STG Manage Bowel with min Assistance. Outcome: Progressing Goal: RH STG SCI MANAGE BOWEL PROGRAM W/ASSIST OR AS APPROPRIATE Description: STG SCI Manage bowel program w/min assist or as appropriate. Outcome: Progressing   Problem: SCI BLADDER ELIMINATION Goal: RH STG MANAGE BLADDER WITH MEDICATION WITH ASSISTANCE Description: STG Manage Bladder With Medication With min Assistance. Outcome: Progressing Goal: RH STG MANAGE BLADDER WITH EQUIPMENT WITH ASSISTANCE Description: STG Manage Bladder With Equipment With min Assistance Outcome: Progressing Goal: RH STG SCI MANAGE BLADDER PROGRAM W/ASSISTANCE Description: Patient will be able to manage bladder independently  Outcome: Progressing   Problem: RH SKIN INTEGRITY Goal: RH STG SKIN FREE OF INFECTION/BREAKDOWN Description: Incision will remain intact and free of infection/breakdown with min assist  Outcome: Progressing Goal: RH STG MAINTAIN SKIN INTEGRITY WITH ASSISTANCE Description: STG Maintain Skin Integrity With cueing Assistance. Outcome: Progressing Goal: RH STG ABLE TO PERFORM INCISION/WOUND CARE W/ASSISTANCE Description: STG Able To Perform Incision/Wound Care With min Assistance. Outcome: Progressing   Problem: RH SAFETY Goal: RH STG ADHERE TO SAFETY PRECAUTIONS W/ASSISTANCE/DEVICE Description: STG Adhere to Safety Precautions With cueing Assistance/Device. Outcome: Progressing Goal: RH STG DECREASED RISK OF FALL WITH ASSISTANCE Description: STG Decreased Risk of Fall With min Assistance. Outcome: Progressing   Problem: RH PAIN MANAGEMENT Goal: RH STG PAIN MANAGED AT OR BELOW PT'S PAIN GOAL Description: Less than 4 with PRN medications min assist  Outcome: Progressing    Problem: RH KNOWLEDGE DEFICIT SCI Goal: RH STG INCREASE KNOWLEDGE OF SELF CARE AFTER SCI Description: Patient/caregiver will be able to managed medications, adhere to back precautions, manage bladder and bowel activities, and diet/life style modifications to improve health from nursing education and nursing handouts independently  Outcome: Progressing

## 2023-09-12 NOTE — Evaluation (Signed)
Occupational Therapy Assessment and Plan  Patient Details  Name: Curtis Hartman MRN: [id number] Date of Birth: 05-13-1961  OT Diagnosis: abnormal posture, acute pain, ataxia, cognitive deficits, lumbago (low back pain), muscular wasting and disuse atrophy, and muscle weakness (generalized) Rehab Potential: Rehab Potential (ACUTE ONLY): Good ELOS: 10-12 days   Today's Date: 09/12/2023 OT Individual Time: 0900-1015 OT Individual Time Calculation (min): 75 min     Hospital Problem: Principal Problem:   Lumbar stenosis   Past Medical History:  Past Medical History:  Diagnosis Date   Arthritis    CKD (chronic kidney disease)    COPD (chronic obstructive pulmonary disease) (HCC)    Diabetes mellitus without complication (HCC)    type 2   GERD (gastroesophageal reflux disease)    Headache    Hx Migraines   Hepatitis    Hypertension    Neuropathy    Past Surgical History:  Past Surgical History:  Procedure Laterality Date   ANTERIOR CERVICAL DECOMP/DISCECTOMY FUSION N/A 11/05/2022   Procedure: Anterior Cervical Decompression/discectomy Fusion Cervical Four-Cervical Five;  Surgeon: Bedelia Person, MD;  Location: [facility];  Service: Neurosurgery;  Laterality: N/A;  3C   ANTERIOR LAT LUMBAR FUSION N/A 09/01/2023   Procedure: DIRECT LUMBAR INTERBODY FUSION, LUMBAR TWO-LUMBAR THREE, LUMBAR THREE-LUMBAR FOUR, LUMBAR FOUR-LUMBAR FIVE, RIGHT PRONE TRANSPSOAS, LUMBAR TWO LUMBAR FIVE POSTERIOR PERCUTANEOUS FUSION LUMBAR FOUR-LUMBAR FIVE MINIMALLY INVASIVE LAMINECTOMY, FORAMINOTOMY;  Surgeon: Bedelia Person, MD;  Location: [facility] OR;  Service: Neurosurgery;  Laterality: N/A;   AV FISTULA PLACEMENT Left 09/10/2023   Procedure: LEFT ARM ARTERIOVENOUS (AV) FISTULA CREATION;  Surgeon: Daria Pastures, MD;  Location: [facility] OR;  Service: Vascular;  Laterality: Left;   BACK SURGERY  2023   Cervical Spine   BIOPSY  03/03/2022   Procedure: BIOPSY;  Surgeon: Lanelle Bal, DO;  Location: [facility];  Service: Endoscopy;;   COLONOSCOPY WITH PROPOFOL N/A 03/03/2022   Procedure: COLONOSCOPY WITH PROPOFOL;  Surgeon: Lanelle Bal, DO;  Location: [facility];  Service: Endoscopy;  Laterality: N/A;  8:00am   ESOPHAGOGASTRODUODENOSCOPY (EGD) WITH PROPOFOL N/A 03/03/2022   Procedure: ESOPHAGOGASTRODUODENOSCOPY (EGD) WITH PROPOFOL;  Surgeon: Lanelle Bal, DO;  Location: [facility];  Service: Endoscopy;  Laterality: N/A;   foot right partial toe amputation Right    FOOT SURGERY Right    x3   hernia surgery x4     LUMBAR PERCUTANEOUS PEDICLE SCREW 3 LEVEL N/A 09/01/2023   Procedure: L2-5 LUMBAR PERCUTANEOUS PEDICLE SCREW 3 LEVEL;  Surgeon: Bedelia Person, MD;  Location: [facility] OR;  Service: Neurosurgery;  Laterality: N/A;   POLYPECTOMY  03/03/2022   Procedure: POLYPECTOMY;  Surgeon: Lanelle Bal, DO;  Location: [facility];  Service: Endoscopy;;    Assessment & Plan Clinical Impression:   Patient currently requires max with basic self-care skills and IADL secondary to muscle weakness, muscle joint tightness, and muscle paralysis, decreased cardiorespiratoy endurance, unbalanced muscle activation, decreased coordination, and decreased motor planning, decreased memory, and decreased standing balance, decreased postural control, decreased balance strategies, and difficulty maintaining precautions.  Prior to hospitalization, patient could complete BADL's and short amb with rollator with modified independent .  Patient will benefit from skilled intervention to decrease level of assist with basic self-care skills, increase independence with basic self-care skills, and increase level of independence with iADL prior to discharge home with care partner.  Anticipate patient will require 24 hour supervision and follow up home health.  OT - End of  Session Activity Tolerance: Decreased this session Endurance Deficit: Yes OT Assessment Rehab Potential (ACUTE ONLY): Good OT Barriers  to Discharge: Inaccessible home environment;Home environment access/layout;Incontinence;Wound Care OT Barriers to Discharge Comments: 2 ste, tub shower, incisions OT Patient demonstrates impairments in the following area(s): Balance;Safety;Behavior;Pain;Endurance;Sensory;Cognition;Motor;Skin Integrity OT Basic ADL's Functional Problem(s): Grooming;Bathing;Dressing;Toileting OT Advanced ADL's Functional Problem(s): Simple Meal Preparation;Light Housekeeping;Laundry OT Transfers Functional Problem(s): Toilet;Tub/Shower OT Additional Impairment(s): Fuctional Use of Upper Extremity OT Plan OT Intensity: Minimum of 1-2 x/day, 45 to 90 minutes OT Frequency: 5 out of 7 days OT Duration/Estimated Length of Stay: 10-12 days OT Treatment/Interventions: Balance/vestibular training;Cognitive remediation/compensation;Community reintegration;Discharge planning;Disease mangement/prevention;DME/adaptive equipment instruction;Functional electrical stimulation;Functional mobility training;Neuromuscular re-education;Pain management;Patient/family education;Psychosocial support;Self Care/advanced ADL retraining;Skin care/wound managment;Splinting/orthotics;Therapeutic Activities;Therapeutic Exercise;UE/LE Strength taining/ROM;UE/LE Coordination activities;Visual/perceptual remediation/compensation;Wheelchair propulsion/positioning OT Self Feeding Anticipated Outcome(s): indep OT Basic Self-Care Anticipated Outcome(s): Supervision OT Toileting Anticipated Outcome(s): Supervision OT Bathroom Transfers Anticipated Outcome(s): Supervision OT Recommendation Recommendations for Other Services: Neuropsych consult;Therapeutic Recreation consult Therapeutic Recreation Interventions: Stress management;Outing/community reintergration Patient destination: Home Follow Up Recommendations: Home health OT Equipment Details: has all DME and AE   OT Evaluation Precautions/Restrictions  Precautions Precautions: Back Precaution  Comments: folay removed during session Spinal Brace: Lumbar corset;Applied in sitting position Restrictions Weight Bearing Restrictions: No Other Position/Activity Restrictions: Pt required assist to don brace. General Chart Reviewed: Yes PT Missed Treatment Reason: Pain;Patient unwilling to participate Family/Caregiver Present: No Vital Signs Therapy Vitals Temp: 98.3 F (36.8 C) Pulse Rate: 72 Resp: 17 BP: 114/76 Patient Position (if appropriate): Lying Oxygen Therapy SpO2: 99 % O2 Device: Room Air Pain Pain Assessment Pain Scale: 0-10 Pain Score: 8  Faces Pain Scale: Hurts little more Pain Type: Surgical pain Pain Location: Back Pain Orientation: Lower Pain Descriptors / Indicators: Burning;Aching;Sharp;Sore Pain Onset: On-going Patients Stated Pain Goal: 2 Pain Intervention(s): Pain med given for lower pain score than stated, per patient request;Repositioned;Distraction;Relaxation;Rest Multiple Pain Sites: No Home Living/Prior Functioning Home Living Family/patient expects to be discharged to:: Private residence Living Arrangements: Other relatives Available Help at Discharge: Family, Available 24 hours/day Type of Home: Aeronautical engineer of Steps: 2 Home Layout: One level Bathroom Shower/Tub: Associate Professor: Yes  Lives With: Family (younger sister and her boyfriend) Prior Function Level of Independence: Requires assistive device for independence, Independent with basic ADLs, Needs assistance with homemaking  Able to Take Stairs?: Yes Driving: No Vocation: On disability 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 Leisure: Hobbies-yes (Comment) (coffe, smoking, tv, fishing) Vision Baseline Vision/History: 1 Wears glasses Ability to See in Adequate Light: 0 Adequate Patient Visual Report: No change from baseline Vision Assessment?: No apparent visual deficits Perception  Perception:  Within Functional Limits Praxis Praxis: WFL Cognition Cognition Overall Cognitive Status: Impaired/Different from baseline Arousal/Alertness: Awake/alert Orientation Level: Person;Place;Situation Memory: Impaired Memory Impairment: Decreased recall of new information;Decreased short term memory Awareness: Appears intact Problem Solving: Appears intact Safety/Judgment: Appears intact Brief Interview for Mental Status (BIMS) Repetition of Three Words (First Attempt): 3 Temporal Orientation: Year: Correct Temporal Orientation: Month: Accurate within 5 days Temporal Orientation: Day: Correct Recall: "Sock": Yes, no cue required Recall: "Blue": Yes, no cue required Recall: "Bed": Yes, after cueing ("a piece of furniture") BIMS Summary Score: 14 Sensation Sensation Light Touch: Impaired by gross assessment Hot/Cold: Appears Intact Proprioception: Appears Intact Stereognosis: Appears Intact Coordination Gross Motor Movements are Fluid and Coordinated: No Fine Motor Movements are Fluid and Coordinated: No Coordination and Movement Description: balance and coord deficits due to weakness, sensation, and pain Finger  Nose Finger Test: dysmetria R>L 9 Hole Peg Test: 9 HPT 2.12 L, 2.55 R Motor  Motor Motor: Other (comment) Motor - Skilled Clinical Observations: UE/LE weakness, balance and coord deficits d/t spinal surgery  Trunk/Postural Assessment  Cervical Assessment Cervical Assessment: Exceptions to [facility] Cervical Strength Overall Cervical Strength: Due to pain Lumbar Assessment Lumbar Assessment: Exceptions to [facility] Postural Control Postural Control: Deficits on evaluation Righting Reactions: slowed Protective Responses: diminshed Postural Limitations: forward head, loss of lumbar curve  Balance Balance Balance Assessed: Yes Static Sitting Balance Static Sitting - Balance Support: Feet supported Static Sitting - Level of Assistance: 5: Stand by assistance Dynamic Sitting  Balance Dynamic Sitting - Balance Support: Feet supported Dynamic Sitting - Level of Assistance: 5: Stand by assistance Static Standing Balance Static Standing - Balance Support: Bilateral upper extremity supported Static Standing - Level of Assistance: 4: Min assist Dynamic Standing Balance Dynamic Standing - Balance Support: Bilateral upper extremity supported Dynamic Standing - Level of Assistance: 3: Mod assist Extremity/Trunk Assessment RUE Assessment RUE Assessment: Exceptions to [facility] General Strength Comments: 3+/5 prox, 3/5 distal, 18 lbs grip LUE Assessment LUE Assessment: Exceptions to [facility] Active Range of Motion (AROM) Comments: 0-45 (hx of L RTC) General Strength Comments: 2-/5 sh, elbow 3+/5, grip 40 lbs  Care Tool Care Tool Self Care Eating   Eating Assist Level: Supervision/Verbal cueing    Oral Care    Oral Care Assist Level: Minimal Assistance - Patient > 75%    Bathing   Body parts bathed by patient: Right arm;Left arm;Chest;Abdomen;Front perineal area;Face Body parts bathed by helper: Right upper leg;Left upper leg;Right lower leg;Left lower leg;Buttocks   Assist Level: Maximal Assistance - Patient 24 - 49%    Upper Body Dressing(including orthotics)       Assist Level: Minimal Assistance - Patient > 75%    Lower Body Dressing (excluding footwear)   What is the patient wearing?: Underwear/pull up;Pants Assist for lower body dressing: Maximal Assistance - Patient 25 - 49%    Putting on/Taking off footwear   What is the patient wearing?: Socks Assist for footwear: Maximal Assistance - Patient 25 - 49%       Care Tool Toileting Toileting activity   Assist for toileting: Maximal Assistance - Patient 25 - 49%     Care Tool Bed Mobility Roll left and right activity   Roll left and right assist level: Contact Guard/Touching assist    Sit to lying activity   Sit to lying assist level: Moderate Assistance - Patient 50 - 74%    Lying to sitting on side  of bed activity   Lying to sitting on side of bed assist level: the ability to move from lying on the back to sitting on the side of the bed with no back support.: Minimal Assistance - Patient > 75%     Care Tool Transfers Sit to stand transfer   Sit to stand assist level: Moderate Assistance - Patient 50 - 74%    Chair/bed transfer   Chair/bed transfer assist level: Moderate Assistance - Patient 50 - 74%     Toilet transfer   Assist Level: Moderate Assistance - Patient 50 - 74%     Care Tool Cognition  Expression of Ideas and Wants Expression of Ideas and Wants: 4. Without difficulty (complex and basic) - expresses complex messages without difficulty and with speech that is clear and easy to understand  Understanding Verbal and Non-Verbal Content Understanding Verbal and Non-Verbal Content: 4. Understands (complex and  basic) - clear comprehension without cues or repetitions   Memory/Recall Ability Memory/Recall Ability : Current season;That he or she is in a hospital/hospital unit   Refer to Care Plan for Long Term Goals  SHORT TERM GOAL WEEK 1 OT Short Term Goal 1 (Week 1): Pt will amb to bathroom with rollator with CGA for toilet and shower bench access OT Short Term Goal 2 (Week 1): Pt will state 3/3 spinal precuations with no cues OT Short Term Goal 3 (Week 1): Pt will utilize AE to perform simple LB dressing with CGA  Recommendations for other services: Neuropsych and Therapeutic Recreation  Stress management and Outing/community reintegration   Skilled Therapeutic Intervention ADL ADL Equipment Provided: Reacher;Sock aid;Long-handled shoe horn;Long-handled sponge Eating: Set up Where Assessed-Eating: Edge of bed Grooming: Minimal assistance Where Assessed-Grooming: Edge of bed Upper Body Bathing: Minimal assistance Where Assessed-Upper Body Bathing: Edge of bed Lower Body Bathing: Maximal assistance Where Assessed-Lower Body Bathing: Edge of bed Upper Body Dressing:  Minimal assistance Where Assessed-Upper Body Dressing: Edge of bed Lower Body Dressing: Maximal assistance Where Assessed-Lower Body Dressing: Edge of bed Toileting: Moderate assistance Where Assessed-Toileting: Bedside Commode Toilet Transfer: Moderate assistance Toilet Transfer Method: Stand pivot Toilet Transfer Equipment: Gaffer Method: Unable to assess Film/video editor Method: Warden/ranger: Transfer tub bench;Grab bars ADL Comments: max A LB, min A UB, min-mod A rollator transfers, max A TLSO donning Mobility  Bed Mobility Bed Mobility: Rolling Right;Rolling Left;Left Sidelying to Sit;Supine to Sit;Sit to Supine Rolling Right: Contact Guard/Touching assist Rolling Left: Contact Guard/Touching assist Left Sidelying to Sit: Minimal Assistance - Patient >75% Supine to Sit: Minimal Assistance - Patient > 75% Sit to Supine: Minimal Assistance - Patient > 75% Transfers Sit to Stand: Moderate Assistance - Patient 50-74% OT Intervention/Training:  Pt seen for full initial OT evaluation and training session this am. Pt in bed upon OT arrival. OT introduced role of therapy and purpose of session. Pt  open to all presented assessment and training this visit. OT assisted and assessed ADL's, mobility, vision, sensation. cognition/lang, G/FMC, strength and balance throughout session. See above for levels. Pt with significant pain, L RTC issues, B hand coord deficits and limited balance. Pt will benefit from skilled OT services at CIR to maximize function and safety with recommendation to return home with mod I for BADL's and S for higher level activity with home vs outpt OT services upon d/c home. Pt left at end of session bed level with LE's elevated and bed alarm set, tray table and nurse call bell within reach.  Discharge Criteria: Patient will be discharged from OT if patient refuses treatment 3 consecutive times without medical reason,  if treatment goals not met, if there is a change in medical status, if patient makes no progress towards goals or if patient is discharged from hospital.  The above assessment, treatment plan, treatment alternatives and goals were discussed and mutually agreed upon: by patient  Curtis Hartman 09/12/2023, 3:47 PM

## 2023-09-13 DIAGNOSIS — M48061 Spinal stenosis, lumbar region without neurogenic claudication: Secondary | ICD-10-CM | POA: Diagnosis not present

## 2023-09-13 DIAGNOSIS — G8918 Other acute postprocedural pain: Secondary | ICD-10-CM | POA: Diagnosis not present

## 2023-09-13 DIAGNOSIS — R339 Retention of urine, unspecified: Secondary | ICD-10-CM | POA: Diagnosis not present

## 2023-09-13 LAB — GLUCOSE, CAPILLARY
Glucose-Capillary: 87 mg/dL (ref 70–99)
Glucose-Capillary: 110 mg/dL — ABNORMAL HIGH (ref 70–99)
Glucose-Capillary: 118 mg/dL — ABNORMAL HIGH (ref 70–99)
Glucose-Capillary: 113 mg/dL — ABNORMAL HIGH (ref 70–99)

## 2023-09-13 MED ORDER — BISACODYL 10 MG RE SUPP: 10.0000 mg | Freq: Once | RECTAL | Status: DC

## 2023-09-13 MED ORDER — OXYCODONE HCL ER 10 MG PO T12A: 10.0000 mg | EXTENDED_RELEASE_TABLET | Freq: Two times a day (BID) | ORAL | Status: DC

## 2023-09-13 NOTE — Plan of Care (Signed)
  Problem: Consults Goal: RH SPINAL CORD INJURY PATIENT EDUCATION Description:  See Patient Education module for education specifics.  Outcome: Progressing   Problem: SCI BOWEL ELIMINATION Goal: RH STG MANAGE BOWEL WITH ASSISTANCE Description: STG Manage Bowel with min Assistance. Outcome: Progressing Goal: RH STG SCI MANAGE BOWEL PROGRAM W/ASSIST OR AS APPROPRIATE Description: STG SCI Manage bowel program w/min assist or as appropriate. Outcome: Progressing   Problem: SCI BLADDER ELIMINATION Goal: RH STG MANAGE BLADDER WITH MEDICATION WITH ASSISTANCE Description: STG Manage Bladder With Medication With min Assistance. Outcome: Progressing Goal: RH STG MANAGE BLADDER WITH EQUIPMENT WITH ASSISTANCE Description: STG Manage Bladder With Equipment With min Assistance Outcome: Progressing Goal: RH STG SCI MANAGE BLADDER PROGRAM W/ASSISTANCE Description: Patient will be able to manage bladder independently  Outcome: Progressing   Problem: RH SKIN INTEGRITY Goal: RH STG SKIN FREE OF INFECTION/BREAKDOWN Description: Incision will remain intact and free of infection/breakdown with min assist  Outcome: Progressing Goal: RH STG MAINTAIN SKIN INTEGRITY WITH ASSISTANCE Description: STG Maintain Skin Integrity With cueing Assistance. Outcome: Progressing Goal: RH STG ABLE TO PERFORM INCISION/WOUND CARE W/ASSISTANCE Description: STG Able To Perform Incision/Wound Care With min Assistance. Outcome: Progressing   Problem: RH SAFETY Goal: RH STG ADHERE TO SAFETY PRECAUTIONS W/ASSISTANCE/DEVICE Description: STG Adhere to Safety Precautions With cueing Assistance/Device. Outcome: Progressing Goal: RH STG DECREASED RISK OF FALL WITH ASSISTANCE Description: STG Decreased Risk of Fall With min Assistance. Outcome: Progressing   Problem: RH PAIN MANAGEMENT Goal: RH STG PAIN MANAGED AT OR BELOW PT'S PAIN GOAL Description: Less than 4 with PRN medications min assist  Outcome: Progressing    Problem: RH KNOWLEDGE DEFICIT SCI Goal: RH STG INCREASE KNOWLEDGE OF SELF CARE AFTER SCI Description: Patient/caregiver will be able to managed medications, adhere to back precautions, manage bladder and bowel activities, and diet/life style modifications to improve health from nursing education and nursing handouts independently  Outcome: Progressing

## 2023-09-13 NOTE — Progress Notes (Addendum)
PROGRESS NOTE   Subjective/Complaints:  Appreciate nephro note , discussed pain meds , overall doing better , no abd pain    ROS- - abd pain , no BM recorded since 11/7 , has catheter , no breathing issues or CP   Objective:   DG Abd 1 View  Result Date: 09/12/2023 CLINICAL DATA:  Abdominal pain. EXAM: ABDOMEN - 1 VIEW COMPARISON:  None Available. FINDINGS: The bowel gas pattern is normal. No radio-opaque calculi or other significant radiographic abnormality are seen. Status post surgical fusion of lumbar spine. IMPRESSION: No abnormal bowel dilatation. Electronically Signed   By: Lupita Raider M.D.   On: 09/12/2023 13:22   Recent Labs    09/11/23 0612 09/12/23 0515  WBC 10.6* 9.4  HGB 7.9* 7.8*  HCT 24.4* 23.8*  PLT 317 327   Recent Labs    09/11/23 0612 09/12/23 0515  NA 139 137  K 5.3* 5.2*  CL 107 106  CO2 22 22  GLUCOSE 106* 98  BUN 85* 85*  CREATININE 5.07* 4.85*  CALCIUM 7.7* 8.7*    Intake/Output Summary (Last 24 hours) at 09/13/2023 0802 Last data filed at 09/13/2023 0747 Gross per 24 hour  Intake 851.96 ml  Output 925 ml  Net -73.04 ml        Physical Exam: Vital Signs Blood pressure 118/69, pulse 70, temperature 98 F (36.7 C), resp. rate 17, height 5\' 9"  (1.753 m), weight 78.8 kg, SpO2 97%.   General: No acute distress Mood and affect are appropriate Heart: Regular rate and rhythm no rubs murmurs or extra sounds Lungs: Clear to auscultation, breathing unlabored, no rales or wheezes Abdomen: Positive bowel sounds, soft nontender to palpation, nondistended Extremities: No clubbing, cyanosis, or edema Skin: No evidence of breakdown, no evidence of rash   Neurologic: Cranial nerves II through XII intact, motor strength is 5/5 in bilateral deltoid, bicep, tricep, grip,3-/5 B  hip flexor, knee extensors,4-  ankle dorsiflexor and plantar flexor Sensory exam reduced LT below ankle on the  right side and below supramalleolar area on left side  R>L hand intrinsic atrophy  Musculoskeletal: Full range of motion in all 4 extremities. No joint swelling   Assessment/Plan: 1. Functional deficits which require 3+ hours per day of interdisciplinary therapy in a comprehensive inpatient rehab setting. Physiatrist is providing close team supervision and 24 hour management of active medical problems listed below. Physiatrist and rehab team continue to assess barriers to discharge/monitor patient progress toward functional and medical goals  Care Tool:  Bathing    Body parts bathed by patient: Right arm, Left arm, Chest, Abdomen, Front perineal area, Face   Body parts bathed by helper: Right upper leg, Left upper leg, Right lower leg, Left lower leg, Buttocks     Bathing assist Assist Level: Maximal Assistance - Patient 24 - 49%     Upper Body Dressing/Undressing Upper body dressing        Upper body assist Assist Level: Minimal Assistance - Patient > 75%    Lower Body Dressing/Undressing Lower body dressing      What is the patient wearing?: Underwear/pull up, Pants     Lower body assist Assist for  lower body dressing: Maximal Assistance - Patient 25 - 49%     Toileting Toileting    Toileting assist Assist for toileting: Maximal Assistance - Patient 25 - 49%     Transfers Chair/bed transfer  Transfers assist     Chair/bed transfer assist level: Moderate Assistance - Patient 50 - 74%     Locomotion Ambulation   Ambulation assist   Ambulation activity did not occur: Safety/medical concerns          Walk 10 feet activity   Assist  Walk 10 feet activity did not occur: Safety/medical concerns        Walk 50 feet activity   Assist Walk 50 feet with 2 turns activity did not occur: Safety/medical concerns         Walk 150 feet activity   Assist Walk 150 feet activity did not occur: Safety/medical concerns         Walk 10 feet on  uneven surface  activity   Assist Walk 10 feet on uneven surfaces activity did not occur: Safety/medical concerns         Wheelchair     Assist Is the patient using a wheelchair?: Yes Type of Wheelchair: Manual    Wheelchair assist level: Dependent - Patient 0% Max wheelchair distance: 10 ft    Wheelchair 50 feet with 2 turns activity    Assist        Assist Level: Dependent - Patient 0%   Wheelchair 150 feet activity     Assist      Assist Level: Dependent - Patient 0%   Blood pressure 118/69, pulse 70, temperature 98 F (36.7 C), resp. rate 17, height 5\' 9"  (1.753 m), weight 78.8 kg, SpO2 97%.  Medical Problem List and Plan: 1. Functional deficits secondary to lumbar stenosis with radiculopathy s/p  L2-L5 DLIF with instrumentation 10/29 with Dr. Maisie Fus             -patient may shower             -ELOS/Goals: 10-14 days, Mod I PT/OT              - Stable for CIR admission   2.  Antithrombotics: -DVT/anticoagulation:  Pharmaceutical: Heparin             -antiplatelet therapy: none   3. Pain Management: Tylenol, Robaxin, oxycodone as needed             -Continue gabapentin 100 mg twice daily Cont oxy IR 10mg  q 4 prn, if somnolent consider change to hydromorphone given renal status    4. Mood/Behavior/Sleep: LCSW to evaluate and provide emotional support             -antipsychotic agents: n/a   5. Neuropsych/cognition: This patient is capable of making decisions on his own behalf.   6. Skin/Wound Care: Routine skin care checks              - R toe sutures removed on admission, WOC order for betadyne BID   7. Fluids/Electrolytes/Nutrition: Strict Is and Os and follow-up chemistries   8: Hypertension: monitor TID and prn; appears stable on oral agents             -continue amlodipine 10 mg daily             -continue carvedilol 3.125 mg twice daily Vitals:   09/13/23 0341 09/13/23 0457  BP: 127/67 118/69  Pulse: 67 70  Resp: 18 17  Temp: 98  F (  36.7 C) 98 F (36.7 C)  SpO2: 98% 97%  Controlled 11/9    9: Diabetes mellitus: CBGs 4 times daily; A1c = 5.7%             -continue sliding scale insulin 0 to 15 units             -? 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 Hx IBS but no further diarrhea   16: Right 4th toe laceration PTA: well-healed, discontinue sutures.    17. ? R biceps tear - documented per record in 2022 by Dr. Hilda Lias at [place]. Likely lost to follow up; would re-establish as OP.    18. Hx cervical stenosis s/p C4-T1 PCDF 2022, then C4/5 ACDF 11/2022 with Dr. Maisie Fus. Pain regimen as above.     19.  Mild hyperK+ due to CKD     LOS: 2 days A FACE TO FACE EVALUATION WAS PERFORMED  Erick Colace 09/13/2023, 8:02 AM

## 2023-09-13 NOTE — Plan of Care (Signed)
  Problem: RH Balance Goal: LTG Patient will maintain dynamic sitting balance (PT) Description: LTG:  Patient will maintain dynamic sitting balance with assistance during mobility activities (PT) Flowsheets (Taken 09/12/2023 1608) LTG: Pt will maintain dynamic sitting balance during mobility activities with:: Supervision/Verbal cueing Goal: LTG Patient will maintain dynamic standing balance (PT) Description: LTG:  Patient will maintain dynamic standing balance with assistance during mobility activities (PT) Flowsheets (Taken 09/12/2023 1608) LTG: Pt will maintain dynamic standing balance during mobility activities with:: Supervision/Verbal cueing   Problem: Sit to Stand Goal: LTG:  Patient will perform sit to stand with assistance level (PT) Description: LTG:  Patient will perform sit to stand with assistance level (PT) Flowsheets (Taken 09/12/2023 1608) LTG: PT will perform sit to stand in preparation for functional mobility with assistance level: Supervision/Verbal cueing   Problem: RH Bed Mobility Goal: LTG Patient will perform bed mobility with assist (PT) Description: LTG: Patient will perform bed mobility with assistance, with/without cues (PT). Flowsheets (Taken 09/12/2023 1608) LTG: Pt will perform bed mobility with assistance level of: Supervision/Verbal cueing   Problem: RH Bed to Chair Transfers Goal: LTG Patient will perform bed/chair transfers w/assist (PT) Description: LTG: Patient will perform bed to chair transfers with assistance (PT). Flowsheets (Taken 09/12/2023 1608) LTG: Pt will perform Bed to Chair Transfers with assistance level: Supervision/Verbal cueing   Problem: RH Car Transfers Goal: LTG Patient will perform car transfers with assist (PT) Description: LTG: Patient will perform car transfers with assistance (PT). Flowsheets (Taken 09/12/2023 1608) LTG: Pt will perform car transfers with assist:: Contact Guard/Touching assist   Problem: RH Ambulation Goal: LTG  Patient will ambulate in controlled environment (PT) Description: LTG: Patient will ambulate in a controlled environment, # of feet with assistance (PT). Flowsheets (Taken 09/12/2023 1608) LTG: Pt will ambulate in controlled environ  assist needed:: Contact Guard/Touching assist LTG: Ambulation distance in controlled environment: at least 100 ft using LRAD Goal: LTG Patient will ambulate in home environment (PT) Description: LTG: Patient will ambulate in home environment, # of feet with assistance (PT). Flowsheets (Taken 09/12/2023 1608) LTG: Pt will ambulate in home environ  assist needed:: Contact Guard/Touching assist LTG: Ambulation distance in home environment: up to 50 ft per bout using LRAD   Problem: RH Wheelchair Mobility Goal: LTG Patient will propel w/c in controlled environment (PT) Description: LTG: Patient will propel wheelchair in controlled environment, # of feet with assist (PT) Flowsheets (Taken 09/12/2023 1608) LTG: Pt will propel w/c in controlled environ  assist needed:: Supervision/Verbal cueing LTG: Propel w/c distance in controlled environment: up to 75 ft per bout   Problem: RH Stairs Goal: LTG Patient will ambulate up and down stairs w/assist (PT) Description: LTG: Patient will ambulate up and down # of stairs with assistance (PT) Flowsheets (Taken 09/12/2023 1608) LTG: Pt will ambulate up/down stairs assist needed:: Minimal Assistance - Patient > 75% LTG: Pt will  ambulate up and down number of stairs: at least 2 steps using HR setup as per home environment

## 2023-09-13 NOTE — Progress Notes (Signed)
Patient refused suppository. Patient educated on the factors and risk of constipation as well as the purpose of suppository. Patient stated, "I am not going to have sh*t stuffed up my a**."  Patient was offered miralax and/or sorbital to see if he can make a bm before dinner and he agreed with sorbitol.

## 2023-09-14 DIAGNOSIS — Z981 Arthrodesis status: Secondary | ICD-10-CM

## 2023-09-14 LAB — BASIC METABOLIC PANEL
Anion gap: 7 (ref 5–15)
BUN: 78 mg/dL — ABNORMAL HIGH (ref 8–23)
CO2: 22 mmol/L (ref 22–32)
Calcium: 8.5 mg/dL — ABNORMAL LOW (ref 8.9–10.3)
Chloride: 106 mmol/L (ref 98–111)
Creatinine, Ser: 4.75 mg/dL — ABNORMAL HIGH (ref 0.61–1.24)
GFR, Estimated: 13 mL/min — ABNORMAL LOW (ref >60)
Glucose, Bld: 96 mg/dL (ref 70–99)
Potassium: 5 mmol/L (ref 3.5–5.1)
Sodium: 135 mmol/L (ref 135–145)

## 2023-09-14 LAB — CBC
HCT: 24.9 % — ABNORMAL LOW (ref 39.0–52.0)
Hemoglobin: 8 g/dL — ABNORMAL LOW (ref 13.0–17.0)
MCH: 29.6 pg (ref 26.0–34.0)
MCHC: 32.1 g/dL (ref 30.0–36.0)
MCV: 92.2 fL (ref 80.0–100.0)
Platelets: 379 10*3/uL (ref 150–400)
RBC: 2.7 MIL/uL — ABNORMAL LOW (ref 4.22–5.81)
RDW: 13.3 % (ref 11.5–15.5)
WBC: 9.8 10*3/uL (ref 4.0–10.5)
nRBC: 0 % (ref 0.0–0.2)

## 2023-09-14 LAB — GLUCOSE, CAPILLARY
Glucose-Capillary: 95 mg/dL (ref 70–99)
Glucose-Capillary: 97 mg/dL (ref 70–99)
Glucose-Capillary: 118 mg/dL — ABNORMAL HIGH (ref 70–99)
Glucose-Capillary: 135 mg/dL — ABNORMAL HIGH (ref 70–99)

## 2023-09-14 NOTE — Progress Notes (Signed)
Inpatient Rehabilitation  Patient information reviewed and entered into eRehab system by Demarrio Menges Gentry, OTR/L, Rehab Quality Coordinator.   Information including medical coding, functional ability and quality indicators will be reviewed and updated through discharge.   

## 2023-09-14 NOTE — Progress Notes (Signed)
Inpatient Rehabilitation Care Coordinator Assessment and Plan Patient Details  Name: Curtis Hartman MRN: [id number] Date of Birth: 11-19-1960  Today's Date: 09/14/2023  Hospital Problems: Principal Problem:   Lumbar stenosis  Past Medical History:  Past Medical History:  Diagnosis Date   Arthritis    CKD (chronic kidney disease)    COPD (chronic obstructive pulmonary disease) (HCC)    Diabetes mellitus without complication (HCC)    type 2   GERD (gastroesophageal reflux disease)    Headache    Hx Migraines   Hepatitis    Hypertension    Neuropathy    Past Surgical History:  Past Surgical History:  Procedure Laterality Date   ANTERIOR CERVICAL DECOMP/DISCECTOMY FUSION N/A 11/05/2022   Procedure: Anterior Cervical Decompression/discectomy Fusion Cervical Four-Cervical Five;  Surgeon: Bedelia Person, MD;  Location: [facility] OR;  Service: Neurosurgery;  Laterality: N/A;  3C   ANTERIOR LAT LUMBAR FUSION N/A 09/01/2023   Procedure: DIRECT LUMBAR INTERBODY FUSION, LUMBAR TWO-LUMBAR THREE, LUMBAR THREE-LUMBAR FOUR, LUMBAR FOUR-LUMBAR FIVE, RIGHT PRONE TRANSPSOAS, LUMBAR TWO LUMBAR FIVE POSTERIOR PERCUTANEOUS FUSION LUMBAR FOUR-LUMBAR FIVE MINIMALLY INVASIVE LAMINECTOMY, FORAMINOTOMY;  Surgeon: Bedelia Person, MD;  Location: [facility] OR;  Service: Neurosurgery;  Laterality: N/A;   AV FISTULA PLACEMENT Left 09/10/2023   Procedure: LEFT ARM ARTERIOVENOUS (AV) FISTULA CREATION;  Surgeon: Daria Pastures, MD;  Location: [facility] OR;  Service: Vascular;  Laterality: Left;   BACK SURGERY  2023   Cervical Spine   BIOPSY  03/03/2022   Procedure: BIOPSY;  Surgeon: Lanelle Bal, DO;  Location: [facility] SUITE;  Service: Endoscopy;;   COLONOSCOPY WITH PROPOFOL N/A 03/03/2022   Procedure: COLONOSCOPY WITH PROPOFOL;  Surgeon: Lanelle Bal, DO;  Location: [facility] END[facility] SUITE;  Service: Endoscopy;  Laterality: N/A;  8:00am   ESOPHAGOGASTRODUODENOSCOPY (EGD) WITH PROPOFOL N/A 03/03/2022   Procedure:  ESOPHAGOGASTRODUODENOSCOPY (EGD) WITH PROPOFOL;  Surgeon: Lanelle Bal, DO;  Location: [facility] SU[facility];  Service: Endoscopy;  Laterality: N/A;   foot right partial toe amputation Right    FOOT SURGERY Right    x3   hernia surgery x4     LUMBAR PERCUTANEOUS PEDICLE SCREW 3 LEVEL N/A 09/01/2023   Procedure: L2-5 LUMBAR PERCUTANEOUS PEDICLE SCREW 3 LEVEL;  Surgeon: Bedelia Person, MD;  Location: [facility] OR;  Service: Neurosurgery;  Laterality: N/A;   POLYPECTOMY  03/03/2022   Procedure: POLYPECTOMY;  Surgeon: Lanelle Bal, DO;  Location: [facility];  Service: Endoscopy;;   Social History:  reports that he has been smoking cigarettes. He has never been exposed to tobacco smoke. He has never used smokeless tobacco. He reports that he does not currently use alcohol. He reports that he does not currently use drugs.  Family / Support Systems Marital Status: Single Patient Roles: Other (Comment) (sibling and friend) Other Supports: Daneen Schick [phone]  Sandra's boyfriend Anticipated Caregiver: Sister Ability/Limitations of Caregiver: According to pt is not working and is there and can assist him Caregiver Availability: 24/7 Family Dynamics: Close with sister and sister's boyfriend all get along well and help one another. He feels she will assist if needed, but hopes he will not need assistance when he leaves here  Social History Preferred language: English Religion:  Cultural Background: No issues Education: HS Health Literacy - How often do you need to have someone help you when you read instructions, pamphlets, or other written material from your doctor or pharmacy?: Never Writes: Yes Employment Status: Disabled Marine scientist Issues: No issues Guardian/Conservator: None-according  to MD pt is capable of making his own decisions while here.   Abuse/Neglect Abuse/Neglect Assessment Can Be Completed: Yes Physical Abuse: Denies Verbal Abuse: Denies Sexual Abuse:  Denies Exploitation of patient/patient's resources: Denies Self-Neglect: Denies  Patient response to: Social Isolation - How often do you feel lonely or isolated from those around you?: Never  Emotional Status Pt's affect, behavior and adjustment status: Pt is motivated to do well and has recently had neck surgery and is still recovering from this. He now has had back surgery. He has always been able to take care of himself and wants to again. he was using a rollator after that surgery. Recent Psychosocial Issues: other surgeries and now this along with his renal issues Psychiatric History: No history/issues-with all of his surgeries so recently would benefit from seeing neuro-psych while here. Substance Abuse History: Tobacco was smoking prior to admission, aware of the health risks involved with this,also knows the resources available to him if wants them  Patient / Family Perceptions, Expectations & Goals Pt/Family understanding of illness & functional limitations: Pt is able to explain his back surgery and the limitaitons he has now as a result. He does talk with the MD involved and feels has a good understanding of his plan moving forward. 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, newspaper, retiree, freind, Network engineer, Catering manager Anticipated changes in roles/activities/participation: resume Pt/family expectations/goals: Pt states: " I plan to be independent when I leave here, I don't want my sister to have to help me too much."  [employer]: Other (Comment) (Had OP in the past at [facility]) Premorbid Home Care/DME Agencies: Other (Comment) (rollator was given can get rw if needed then) Transportation available at discharge: sandra and her boyfriend Is the patient able to respond to transportation needs?: Yes In the past 12 months, has lack of transportation kept you from medical appointments or from getting medications?: No In the past 12 months, has lack of transportation kept  you from meetings, work, or from getting things needed for daily living?: No Resource referrals recommended: Neuropsychology  Discharge Planning Living Arrangements: Other relatives, Non-relatives/Friends Support Systems: Other relatives, Friends/neighbors Type of Residence: Private residence Insurance Resources: [employer] (specify county) (Well care) Surveyor, quantity Resources: SSI, Family Support Financial Screen Referred: No Living Expenses: Lives with family Money Management: Patient Does the patient have any problems obtaining your medications?: No Home Management: all of them Patient/Family Preliminary Plans: Return home with sister and sister's boyfriend both will assist him if needed. Aware hjis ELOS is 10-14 days and goal to reach. He hopes to exceed them Care Coordinator Barriers to Discharge: Insurance for SNF coverage Care Coordinator Anticipated Follow Up Needs: HH/OP  Clinical Impression Pleasant gentleman who is motivated to do well and recover from his back surgery he still is recovering for his two neck surgeries. Aware team conference tomorrow to discuss goals and target discharge date. Have asked for neuro-psych to see while here.   Lucy Chris 09/14/2023, 11:06 AM

## 2023-09-14 NOTE — Progress Notes (Signed)
Physical Therapy Session Note  Patient Details  Name: Curtis Hartman MRN: [id number] Date of Birth: November 27, 1960  Today's Date: 09/14/2023 PT Individual Time: 0905-1000, 1400-1500  PT Individual Time Calculation (min): 55 min , 60 min   Short Term Goals: Week 1:  PT Short Term Goal 1 (Week 1): Pt will perform bed mobility with overall CGA/ minA using bed features. PT Short Term Goal 2 (Week 1): Pt will perform sit<>stand transfers with overall CGA. PT Short Term Goal 3 (Week 1): Pt will perform stand pivot transfers with overall MinA. PT Short Term Goal 4 (Week 1): Pt will initiate gait training. PT Short Term Goal 5 (Week 1): Pt will initiate w/c mobility training.  Skilled Therapeutic Interventions/Progress Updates:      Therapy Documentation Precautions:  Precautions Precautions: Back, Fall Precaution Comments: decreased sensation in RLE Required Braces or Orthoses: Spinal Brace Spinal Brace: Lumbar corset, Applied in sitting position Restrictions Weight Bearing Restrictions: No Other Position/Activity Restrictions: Pt required assist to don brace.  Treatment Session 1:   Pt received seated in w/c at bedside with nurse present for pain and morning medications. Pt reports pain is always greater than 10, pt educated on energy conservation and to incorporate rest breaks for pain relief and to address activity tolerance deficits. Pt oriented to CIR provider team, therapy team, SW, and policies/procedures. Pt dependently transported by w/c for energy conservation and CGA/min A with sit<>stand and gait x 50 ft with RW and w/c follow. Pt requires verbal and visual cues for upright gaze and to keep walker in closer proximity to body. PT adjusted RW height and pt returned to room and left seated at bedside with all needs in reach and alarm on.    Treatment Session 2:   Pt received semi-reclined in bed and with unrated low back pain, nurse notified and provided pain medication in  session. Pt with questions regarding surgical procedure and PT provided education on interbody fusion. Pt supervision with supine to sit with log roll technique and CGA/min A with ambulatory transfer ~ 5 ft to recliner. Pt educated on incorporation of breathing with following exercises:   -alternating resisted marches 2 x 10   -alternating LAQ 2 x 10  Pt CGA with sit<>stand and with urgency to void, PT positioned urinal and changed brief. Pt supervision with sit to lying and left in bed with all needs in reach.   Therapy/Group: Individual Therapy  Truitt Leep Truitt Leep PT, DPT  09/14/2023, 7:40 AM

## 2023-09-14 NOTE — Progress Notes (Signed)
Occupational Therapy Session Note  Patient Details  Name: Curtis Hartman MRN: [id number] Date of Birth: 11/07/1960  Today's Date: 09/14/2023 OT Individual Time: 7829-5621 OT Individual Time Calculation (min): 59 min    Short Term Goals: Week 1:  OT Short Term Goal 1 (Week 1): Pt will amb to bathroom with rollator with CGA for toilet and shower bench access OT Short Term Goal 2 (Week 1): Pt will state 3/3 spinal precuations with no cues OT Short Term Goal 3 (Week 1): Pt will utilize AE to perform simple LB dressing with CGA  Skilled Therapeutic Interventions/Progress Updates:      Therapy Documentation Precautions:  Precautions Precautions: Back, Fall Precaution Comments: decreased sensation in RLE Required Braces or Orthoses: Spinal Brace Spinal Brace: Lumbar corset, Applied in sitting position Restrictions Weight Bearing Restrictions: No Other Position/Activity Restrictions: Pt required assist to don brace. General: "I do like to fish" Pt seated in W/C upon OT arrival, agreeable to OT.  Pain:  10/10 pain reported in shoulders and low back, positioning, intermittent rest breaks, distractions provided for pain management, pt reports tolerable to proceed.   ADL: Transfers: Min A with RW stand pivot transfer from W/C>EOB Bed mobility: Min A to lightly manage LE into bed  Other Treatments: OT completed edema massage on Rt hand d/t increased swelling. OT educated pt on elevation of limb in order to decrease swelling while laying in bed. Hand presented with decreased swelling after massage and pt reported increased ROM. OT provided pain management with heat packs behind neck and Rt shoulder. Pt educated on pain holistic pain relief modalities such as heat/ice/breathing techniques. Pt reported wearing soft collar for comfort at home for pain relief and stability. OT reached out to doctor and PA to order soft collar for comfort.   Pt supine in bed with bed alarm activated, 2 bed  rails up, call light within reach and 4Ps assessed.   Therapy/Group: Individual Therapy  Velia Meyer, OTD, OTR/L 09/14/2023, 12:33 PM

## 2023-09-14 NOTE — Progress Notes (Signed)
Occupational Therapy Session Note  Patient Details  Name: Curtis Hartman MRN: [id number] Date of Birth: [birth year]  Today's Date: 09/14/2023 OT Individual Time: 4098-1191 OT Individual Time Calculation (min): 28 min    Short Term Goals: Week 1:  OT Short Term Goal 1 (Week 1): Pt will amb to bathroom with rollator with CGA for toilet and shower bench access OT Short Term Goal 2 (Week 1): Pt will state 3/3 spinal precuations with no cues OT Short Term Goal 3 (Week 1): Pt will utilize AE to perform simple LB dressing with CGA  Skilled Therapeutic Interventions/Progress Updates:  Pt greeted resting in bed for skilled OT session with focus on BADL participation and functional transfers.   Pain: Pt with 9/10 pain at surgical site, OT offering intermediate rest breaks and positioning suggestions throughout session to address pain/fatigue and maximize participation/safety in session.   Functional Transfers: Pt able to recall 3/3 spinal/back precautions. Pt completes bed mobility with supervision + use of bed features. Performing STS and stand-step transfer from EOB>WC with Min A + RW.   Self Care Tasks: At EOB, pt threads BLE into scrub shorts with use of reacher and increased time, donning t-shirt with setup/supervision, Mod A for donning LSO. Pt unaware of large bladder incontinence in brief, dependent for change due to time constraints.   Pt remained sitting in WC with 4Ps assessed and immediate needs met. Pt continues to be appropriate for skilled OT intervention to promote further functional independence in ADLs/IADLs.   Therapy Documentation Precautions:  Precautions Precautions: Back, Fall Precaution Comments: decreased sensation in RLE Required Braces or Orthoses: Spinal Brace Spinal Brace: Lumbar corset, Applied in sitting position Restrictions Weight Bearing Restrictions: No Other Position/Activity Restrictions: Pt required assist to don brace.   Therapy/Group: Individual  Therapy  Lou Cal, OTR/L, MSOT  09/14/2023, 6:11 AM

## 2023-09-14 NOTE — Progress Notes (Signed)
PROGRESS NOTE   Subjective/Complaints:  Pt reports back "killing him" but hasn't gotten meds yet this AM.  Pt reports was having urinary retention, however this Am, peeing a large amount and overall doing bettter.  BP yesterday early AM was 217/193-  has been doing well since that BP.  LBM x2 yesterday- took PO meds- refused suppository- and emphatic nothing PR.   Last Bladder scan 108- yesterday early afternoon.    ROS- - Pt denies SOB, abd pain, CP, N/V/C/D, and vision changes  Except for HPI Objective:   No results found. Recent Labs    09/12/23 0515 09/14/23 0453  WBC 9.4 9.8  HGB 7.8* 8.0*  HCT 23.8* 24.9*  PLT 327 379   Recent Labs    09/12/23 0515 09/14/23 0453  NA 137 135  K 5.2* 5.0  CL 106 106  CO2 22 22  GLUCOSE 98 96  BUN 85* 78*  CREATININE 4.85* 4.75*  CALCIUM 8.7* 8.5*    Intake/Output Summary (Last 24 hours) at 09/14/2023 1259 Last data filed at 09/14/2023 3875 Gross per 24 hour  Intake 720 ml  Output 1150 ml  Net -430 ml        Physical Exam: Vital Signs Blood pressure 138/74, pulse 74, temperature 98.1 F (36.7 C), temperature source Oral, resp. rate 16, height 5\' 9"  (1.753 m), weight 77.6 kg, SpO2 98%.    General: awake, alert, appropriate, sitting up in bed- voided ~ 300cc in urinal; MEWS now green; ate 100% tray; NAD HENT: conjugate gaze; oropharynx moist CV: regular rate and rhythm; no JVD Pulmonary: CTA B/L; no W/R/R- good air movement GI: soft, NT, ND, (+)BS- more normoactive Psychiatric: appropriate- irritable Neurological: Ox3  Skin: No evidence of breakdown, no evidence of rash   Neurologic: Cranial nerves II through XII intact, motor strength is 5/5 in bilateral deltoid, bicep, tricep, grip,3-/5 B  hip flexor, knee extensors,4-  ankle dorsiflexor and plantar flexor Sensory exam reduced LT below ankle on the right side and below supramalleolar area on left side   R>L hand intrinsic atrophy  Musculoskeletal: Full range of motion in all 4 extremities. No joint swelling   Assessment/Plan: 1. Functional deficits which require 3+ hours per day of interdisciplinary therapy in a comprehensive inpatient rehab setting. Physiatrist is providing close team supervision and 24 hour management of active medical problems listed below. Physiatrist and rehab team continue to assess barriers to discharge/monitor patient progress toward functional and medical goals  Care Tool:  Bathing    Body parts bathed by patient: Right arm, Left arm, Chest, Abdomen, Front perineal area, Face   Body parts bathed by helper: Right upper leg, Left upper leg, Right lower leg, Left lower leg, Buttocks     Bathing assist Assist Level: Maximal Assistance - Patient 24 - 49%     Upper Body Dressing/Undressing Upper body dressing        Upper body assist Assist Level: Minimal Assistance - Patient > 75%    Lower Body Dressing/Undressing Lower body dressing      What is the patient wearing?: Underwear/pull up, Pants     Lower body assist Assist for lower body dressing: Maximal Assistance -  Patient 25 - 49%     Toileting Toileting    Toileting assist Assist for toileting: Maximal Assistance - Patient 25 - 49%     Transfers Chair/bed transfer  Transfers assist     Chair/bed transfer assist level: Moderate Assistance - Patient 50 - 74%     Locomotion Ambulation   Ambulation assist   Ambulation activity did not occur: Safety/medical concerns          Walk 10 feet activity   Assist  Walk 10 feet activity did not occur: Safety/medical concerns        Walk 50 feet activity   Assist Walk 50 feet with 2 turns activity did not occur: Safety/medical concerns         Walk 150 feet activity   Assist Walk 150 feet activity did not occur: Safety/medical concerns         Walk 10 feet on uneven surface  activity   Assist Walk 10 feet on  uneven surfaces activity did not occur: Safety/medical concerns         Wheelchair     Assist Is the patient using a wheelchair?: Yes Type of Wheelchair: Manual    Wheelchair assist level: Dependent - Patient 0% Max wheelchair distance: 10 ft    Wheelchair 50 feet with 2 turns activity    Assist        Assist Level: Dependent - Patient 0%   Wheelchair 150 feet activity     Assist      Assist Level: Dependent - Patient 0%   Blood pressure 138/74, pulse 74, temperature 98.1 F (36.7 C), temperature source Oral, resp. rate 16, height 5\' 9"  (1.753 m), weight 77.6 kg, SpO2 98%.  Medical Problem List and Plan: 1. Functional deficits secondary to lumbar stenosis with radiculopathy s/p  L2-L5 DLIF with instrumentation 10/29 with Dr. Maisie Fus             -patient may shower             -ELOS/Goals: 10-14 days, Mod I PT/OT              Con't CIR PT and OT  IPOC done today;   Team conference to determine length of stay tomorrow 2.  Antithrombotics: -DVT/anticoagulation:  Pharmaceutical: Heparin             -antiplatelet therapy: none   3. Pain Management: Tylenol, Robaxin, oxycodone as needed             -Continue gabapentin 100 mg twice daily Cont oxy IR 10mg  q 4 prn, if somnolent consider change to hydromorphone given renal status    11/11- not lethargic- so will maintain dosing for now 4. Mood/Behavior/Sleep: LCSW to evaluate and provide emotional support             -antipsychotic agents: n/a   5. Neuropsych/cognition: This patient is capable of making decisions on his own behalf.   6. Skin/Wound Care: Routine skin care checks              - R toe sutures removed on admission, WOC order for betadyne BID   7. Fluids/Electrolytes/Nutrition: Strict Is and Os and follow-up chemistries   8: Hypertension: monitor TID and prn; appears stable on oral agents             -continue amlodipine 10 mg daily             -continue carvedilol 3.125 mg twice daily Vitals:    09/14/23 1610  09/14/23 0858  BP: (!) 140/71 138/74  Pulse: 75 74  Resp:    Temp:    SpO2:    Controlled 11/9  11/11- BP controlled in last 24 hours, but was real high yesterday early AM- however nothing like that since- not sure if was correct? Not clear   9: Diabetes mellitus: CBGs 4 times daily; A1c = 5.7%             -continue sliding scale insulin 0 to 15 units             -? resume home Lantus 5 units daily   CBG (last 3)  Recent Labs    09/13/23 2035 09/14/23 0550 09/14/23 1130  GLUCAP 113* 95 97   Controlled 11/10  11/11- CBGs controlled- look good-c on't regimen 10: Urinary retention, acute, h/o BPH:              -had indwelling Foley catheter -continue Flomax 0.4 mg daily  -Foley placed 10/30; DC foley trial with ISC Q6H for PVR >350 ccs to start in AM 11/9   11/11- foley out-= last PVR 108cc- will monitor with bladder scans 11: AKI/CKD stage IV: Nearing need for hemodialysis             -Nephrology signed off, he has had LUE AV fistula created 11/7 (follow-up arranged with [facility])- pt will f/u with Dr Wolfgang Phoenix as OP              -On Calcitrol, PhosLo    11/11- looking slightly better this AM 12: 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 Hx IBS but no further diarrhea   16: Right 4th toe laceration PTA: well-healed, discontinue sutures.    17. ? R biceps tear - documented per record in 2022 by Dr. Hilda Lias at [place]. Likely lost to follow up; would re-establish as OP.    18. Hx cervical stenosis s/p C4-T1 PCDF 2022, then C4/5 ACDF 11/2022 with Dr. Maisie Fus. Pain regimen as above.     19.  Mild hyperK+ due to CKD    11/11- K+ down to 5.0  I  spent a total of 36   minutes on total care today- >50% coordination of care- due to  Pt seen 2 different times this Am per his request- also IPOC done and spoke with nursing at length about BP yesterday early AM  LOS: 3 days A FACE TO FACE EVALUATION WAS PERFORMED  Naara Kelty 09/14/2023, 12:59 PM

## 2023-09-14 NOTE — Progress Notes (Signed)
Inpatient Rehabilitation Center Individual Statement of Services  Patient Name:  DANA BESTOR  Date:  09/14/2023  Welcome to the Inpatient Rehabilitation Center.  Our goal is to provide you with an individualized program based on your diagnosis and situation, designed to meet your specific needs.  With this comprehensive rehabilitation program, you will be expected to participate in at least 3 hours of rehabilitation therapies Monday-Friday, with modified therapy programming on the weekends.  Your rehabilitation program will include the following services:  Physical Therapy (PT), Occupational Therapy (OT), 24 hour per day rehabilitation nursing, Therapeutic Recreaction (TR), Neuropsychology, Care Coordinator, Rehabilitation Medicine, Nutrition Services, and Pharmacy Services  Weekly team conferences will be held on Tuesday to discuss your progress.  Your Inpatient Rehabilitation Care Coordinator will talk with you frequently to get your input and to update you on team discussions.  Team conferences with you and your family in attendance may also be held.  Expected length of stay: 10-14 days  Overall anticipated outcome: supervision-CGA level  Depending on your progress and recovery, your program may change. Your Inpatient Rehabilitation Care Coordinator will coordinate services and will keep you informed of any changes. Your Inpatient Rehabilitation Care Coordinator's name and contact numbers are listed  below.  The following services may also be recommended but are not provided by the Inpatient Rehabilitation Center:  Driving Evaluations Home Health Rehabiltiation Services Outpatient Rehabilitation Services    Arrangements will be made to provide these services after discharge if needed.  Arrangements include referral to agencies that provide these services.  Your insurance has been verified to be:  medicaid Well care Your primary doctor is:  Coral Ceo  Pertinent information will  be shared with your doctor and your insurance company.  Inpatient Rehabilitation Care Coordinator:  Dossie Der, [place] [phone] or Luna Glasgow  Information discussed with and copy given to patient by: Lucy Chris, 09/14/2023, 9:33 AM

## 2023-09-14 NOTE — IPOC Note (Signed)
Overall Plan of Care [facility]) Patient Details Name: Curtis Hartman MRN: [id number] DOB: 03/25/1961  Admitting Diagnosis: Lumbar stenosis  Hospital Problems: Principal Problem:   Lumbar stenosis     Functional Problem List: Nursing Bladder, Bowel, Endurance, Pain, Safety, Sensory  PT Balance, Behavior, Endurance, Motor, Pain, Sensory, Skin Integrity, Safety  OT Balance, Safety, Behavior, Pain, Endurance, Sensory, Cognition, Motor, Skin Integrity  SLP    TR         Basic ADL's: OT Grooming, Bathing, Dressing, Toileting     Advanced  ADL's: OT Simple Meal Preparation, Light Housekeeping, Laundry     Transfers: PT Bed Mobility, Bed to Chair, Set designer, Occupational psychologist, Research scientist (life sciences): PT Ambulation, Stairs     Additional Impairments: OT Fuctional Use of Upper Extremity  SLP        TR      Anticipated Outcomes Item Anticipated Outcome  Self Feeding indep  Swallowing      Basic self-care  Marketing executive Transfers Supervision  Bowel/Bladder  Independent with bladder/bowel program  Transfers  CGA  Locomotion  CGA/ MinA  Communication     Cognition     Pain  less than 4  Safety/Judgment  with cues   Therapy Plan: PT Intensity: Minimum of 1-2 x/day ,45 to 90 minutes PT Frequency: 5 out of 7 days PT Duration Estimated Length of Stay: 10-14 days OT Intensity: Minimum of 1-2 x/day, 45 to 90 minutes OT Frequency: 5 out of 7 days OT Duration/Estimated Length of Stay: 10-12 days     Team Interventions: Nursing Interventions Patient/Family Education, Bladder Management, Bowel Management, Disease Management/Prevention, Pain Management, Medication Management, Discharge Planning, Psychosocial Support  PT interventions Ambulation/gait training, Community reintegration, DME/adaptive equipment instruction, Neuromuscular re-education, Psychosocial support, Stair training, UE/LE Strength taining/ROM, Wheelchair  propulsion/positioning, UE/LE Coordination activities, Therapeutic Activities, Skin care/wound management, Pain management, Functional electrical stimulation, Discharge planning, Warden/ranger, Cognitive remediation/compensation, Disease management/prevention, Functional mobility training, Patient/family education, Splinting/orthotics, Therapeutic Exercise, Visual/perceptual remediation/compensation  OT Interventions Warden/ranger, Cognitive remediation/compensation, Community reintegration, Discharge planning, Disease mangement/prevention, DME/adaptive equipment instruction, Functional electrical stimulation, Functional mobility training, Neuromuscular re-education, Pain management, Patient/family education, Psychosocial support, Self Care/advanced ADL retraining, Skin care/wound managment, Splinting/orthotics, Therapeutic Activities, Therapeutic Exercise, UE/LE Strength taining/ROM, UE/LE Coordination activities, Visual/perceptual remediation/compensation, Wheelchair propulsion/positioning  SLP Interventions    TR Interventions    SW/CM Interventions Discharge Planning, Psychosocial Support, Patient/Family Education   Barriers to Discharge MD  Medical stability, Home enviroment access/loayout, Neurogenic bowel and bladder, Wound care, Lack of/limited family support, Weight bearing restrictions, and Medication compliance  Nursing Decreased caregiver support, Home environment access/layout, Neurogenic Bowel & Bladder, Lack of/limited family support, Insurance for SNF coverage, Hemodialysis 1 level 2 ste no rail  PT Inaccessible home environment, Decreased caregiver support, Home environment access/layout, Incontinence, Wound Care, Lack of/limited family support, Insurance for SNF coverage, Medication compliance, Nutrition means, Behavior    OT Inaccessible home environment, Home environment access/layout, Incontinence, Wound Care 2 ste, tub shower, incisions  SLP      SW  Insurance for SNF coverage     Team Discharge Planning: Destination: PT-Home ,OT- Home , SLP-  Projected Follow-up: PT-Home health PT, 24 hour supervision/assistance, OT-  Home health OT, SLP-  Projected Equipment Needs: PT-To be determined, OT-  , SLP-  Equipment Details: PT- , OT-has all DME and AE Patient/family involved in discharge planning: PT- Patient,  OT-Patient, SLP-   MD ELOS: 10-12 days Medical Rehab  Prognosis:  Good Assessment: The patient has been admitted for CIR therapies with the diagnosis of lumbar stenosis s/p DLIF. The team will be addressing functional mobility, strength, stamina, balance, safety, adaptive techniques and equipment, self-care, bowel and bladder mgt, patient and caregiver education, . Goals have been set at supervision to min A. Anticipated discharge destination is home.        See Team Conference Notes for weekly updates to the plan of care

## 2023-09-15 ENCOUNTER — Inpatient Hospital Stay (HOSPITAL_COMMUNITY): Payer: Medicaid Other

## 2023-09-15 DIAGNOSIS — Z981 Arthrodesis status: Secondary | ICD-10-CM | POA: Diagnosis not present

## 2023-09-15 LAB — GLUCOSE, CAPILLARY
Glucose-Capillary: 102 mg/dL — ABNORMAL HIGH (ref 70–99)
Glucose-Capillary: 84 mg/dL (ref 70–99)
Glucose-Capillary: 115 mg/dL — ABNORMAL HIGH (ref 70–99)

## 2023-09-15 NOTE — Progress Notes (Signed)
Patient ID: Curtis Hartman, male   DOB: Oct 18, 1961, 62 y.o.   MRN: [id number]  Met with pt to give him the team conference update regarding goals of supervision level and target discharge date of 11/21. He has text his sister to let her know of this. He requested to see his surgeon to make sure he is healing as he should be. Have messaged MD and PA to let them know about this request. Discussed will await team's recommendations regarding equipment and follow up. He has been to [facility] in [place] for OPPT with other surgeries. Will continue to work on discharge needs. Pt was having pain issues and not wanting to talk anymore.

## 2023-09-15 NOTE — Progress Notes (Signed)
Occupational Therapy Session Note  Patient Details  Name: Curtis Hartman MRN: [id number] Date of Birth: [birth year]  Today's Date: 09/15/2023 OT Individual Time: 1300-1400 OT Individual Time Calculation (min): 60 min    Short Term Goals: Week 1:  OT Short Term Goal 1 (Week 1): Pt will amb to bathroom with rollator with CGA for toilet and shower bench access OT Short Term Goal 2 (Week 1): Pt will state 3/3 spinal precuations with no cues OT Short Term Goal 3 (Week 1): Pt will utilize AE to perform simple LB dressing with CGA  Skilled Therapeutic Interventions/Progress Updates:      Therapy Documentation Precautions:  Precautions Precautions: Back, Fall Precaution Comments: decreased sensation in RLE Required Braces or Orthoses: Spinal Brace Spinal Brace: Lumbar corset, Applied in sitting position Restrictions Weight Bearing Restrictions: No Other Position/Activity Restrictions: Pt required assist to don brace. General: "Can we go outside?" Pt supine in bed upon OT arrival, agreeable to OT session. Pt taken to atrium and outside for increased mood and quality of life.  Pain:  10/10 pain reported in back and Rt shoulder, activity, intermittent rest breaks, distractions provided for pain management, pt reports tolerable to proceed.   ADL: Toileting: pt seated EOB with urinal to urinate, min A overall for pants management LB dressing: Min A overall with use of reacher to thread over feet, pt able to complete with increased time, assistance to manage over Rt hip  Exercises: Pt demonstrated community integration with W/C in order to prepare for community integration at D/C. Pt went to ATM and gift shop and using BUE to reach out of BOS while seated in W/C to pay for items. Pt practices maneuvering into elevators, pulling into table tops, maneuvering around obstacles.  Other Treatments: OT educated pt on potential causes of R shoulder pain. Pt educated on light stretching in order to  increase ROM and decrease pain and in right shoulder.   Pt seated in W/C at end of session with W/C alarm donned, call light within reach and 4Ps assessed.    Therapy/Group: Individual Therapy  Velia Meyer, OTD, OTR/L 09/15/2023, 4:35 PM

## 2023-09-15 NOTE — Progress Notes (Signed)
PROGRESS NOTE   Subjective/Complaints:  Pt reports they aren't scanning his bladder anymore.   Sore all over- thinks from therapy- doing so much more than used to- since also had 2 neck surgeries 1 year apart not long ago.  Peeing "well" LBM last night  ROS- -  Pt denies SOB, abd pain, CP, N/V/C/D, and vision changes   Except for HPI Objective:   No results found. Recent Labs    09/14/23 0453  WBC 9.8  HGB 8.0*  HCT 24.9*  PLT 379   Recent Labs    09/14/23 0453  NA 135  K 5.0  CL 106  CO2 22  GLUCOSE 96  BUN 78*  CREATININE 4.75*  CALCIUM 8.5*    Intake/Output Summary (Last 24 hours) at 09/15/2023 0858 Last data filed at 09/15/2023 0800 Gross per 24 hour  Intake 596 ml  Output 300 ml  Net 296 ml        Physical Exam: Vital Signs Blood pressure (!) 126/58, pulse 71, temperature 98.4 F (36.9 C), resp. rate 17, height 5\' 9"  (1.753 m), weight 77.1 kg, SpO2 99%.     General: awake, alert, appropriate, sitting up in bed; NAD HENT: conjugate gaze; oropharynx moist CV: regular rate and rhythm; no JVD Pulmonary: CTA B/L; no W/R/R- good air movement GI: soft, NT, ND, (+)BS Psych: appropriate/- less irritable Neurological: Ox3   Skin: No evidence of breakdown, no evidence of rash   Neurologic: Cranial nerves II through XII intact, motor strength is 5/5 in bilateral deltoid, bicep, tricep, grip,3-/5 B  hip flexor, knee extensors,4-  ankle dorsiflexor and plantar flexor Sensory exam reduced LT below ankle on the right side and below supramalleolar area on left side  R>L hand intrinsic atrophy  Musculoskeletal: Full range of motion in all 4 extremities. No joint swelling   Assessment/Plan: 1. Functional deficits which require 3+ hours per day of interdisciplinary therapy in a comprehensive inpatient rehab setting. Physiatrist is providing close team supervision and 24 hour management of active  medical problems listed below. Physiatrist and rehab team continue to assess barriers to discharge/monitor patient progress toward functional and medical goals  Care Tool:  Bathing    Body parts bathed by patient: Right arm, Left arm, Chest, Abdomen, Front perineal area, Face   Body parts bathed by helper: Right upper leg, Left upper leg, Right lower leg, Left lower leg, Buttocks     Bathing assist Assist Level: Maximal Assistance - Patient 24 - 49%     Upper Body Dressing/Undressing Upper body dressing        Upper body assist Assist Level: Minimal Assistance - Patient > 75%    Lower Body Dressing/Undressing Lower body dressing      What is the patient wearing?: Underwear/pull up, Pants     Lower body assist Assist for lower body dressing: Maximal Assistance - Patient 25 - 49%     Toileting Toileting    Toileting assist Assist for toileting: Maximal Assistance - Patient 25 - 49%     Transfers Chair/bed transfer  Transfers assist     Chair/bed transfer assist level: Moderate Assistance - Patient 50 - 74%  Locomotion Ambulation   Ambulation assist   Ambulation activity did not occur: Safety/medical concerns          Walk 10 feet activity   Assist  Walk 10 feet activity did not occur: Safety/medical concerns        Walk 50 feet activity   Assist Walk 50 feet with 2 turns activity did not occur: Safety/medical concerns         Walk 150 feet activity   Assist Walk 150 feet activity did not occur: Safety/medical concerns         Walk 10 feet on uneven surface  activity   Assist Walk 10 feet on uneven surfaces activity did not occur: Safety/medical concerns         Wheelchair     Assist Is the patient using a wheelchair?: Yes Type of Wheelchair: Manual    Wheelchair assist level: Dependent - Patient 0% Max wheelchair distance: 10 ft    Wheelchair 50 feet with 2 turns activity    Assist        Assist  Level: Dependent - Patient 0%   Wheelchair 150 feet activity     Assist      Assist Level: Dependent - Patient 0%   Blood pressure (!) 126/58, pulse 71, temperature 98.4 F (36.9 C), resp. rate 17, height 5\' 9"  (1.753 m), weight 77.1 kg, SpO2 99%.  Medical Problem List and Plan: 1. Functional deficits secondary to lumbar stenosis with radiculopathy s/p  L2-L5 DLIF with instrumentation 10/29 with Dr. Maisie Fus             -patient may shower             -ELOS/Goals: 10-14 days, Mod I PT/OT             Con't CIR PT and OT  Team conference today to determine length of stay 2.  Antithrombotics: -DVT/anticoagulation:  Pharmaceutical: Heparin             -antiplatelet therapy: none   3. Pain Management: Tylenol, Robaxin, oxycodone as needed             -Continue gabapentin 100 mg twice daily Cont oxy IR 10mg  q 4 prn, if somnolent consider change to hydromorphone given renal status    11/11- not lethargic- so will maintain dosing for now  11/12- having more pain due to soreness from therapies- will maintain regimen- has Robaxin as needed for muscle spasms 4. Mood/Behavior/Sleep: LCSW to evaluate and provide emotional support             -antipsychotic agents: n/a   5. Neuropsych/cognition: This patient is capable of making decisions on his own behalf.   6. Skin/Wound Care: Routine skin care checks              - R toe sutures removed on admission, WOC order for betadyne BID   7. Fluids/Electrolytes/Nutrition: Strict Is and Os and follow-up chemistries   8: Hypertension: monitor TID and prn; appears stable on oral agents             -continue amlodipine 10 mg daily             -continue carvedilol 3.125 mg twice daily Vitals:   09/14/23 1946 09/15/23 0532  BP: 127/67 (!) 126/58  Pulse: 73 71  Resp: 18 17  Temp: 98 F (36.7 C) 98.4 F (36.9 C)  SpO2: 98% 99%  Controlled 11/9  11/11- BP controlled in last 24 hours, but was real  high yesterday early AM- however nothing like  that since- not sure if was correct? Not clear   11/12- BP controlled- con't regimen 9: Diabetes mellitus: CBGs 4 times daily; A1c = 5.7%             -continue sliding scale insulin 0 to 15 units             -? resume home Lantus 5 units daily   CBG (last 3)  Recent Labs    09/14/23 1130 09/14/23 1648 09/14/23 2125  GLUCAP 97 118* 135*   Controlled 11/10  11/11- CBGs controlled- look good-c on't regimen  11/12- not on Home dose Lantus, but CBGs look great- will not rstart right now 10: Urinary retention, acute, h/o BPH:              -had indwelling Foley catheter -continue Flomax 0.4 mg daily  -Foley placed 10/30; DC foley trial with ISC Q6H for PVR >350 ccs to start in AM 11/9   11/11- foley out-= last PVR 108cc- will monitor with bladder scans 11: AKI/CKD stage IV: Nearing need for hemodialysis             -Nephrology signed off, he has had LUE AV fistula created 11/7 (follow-up arranged with [facility])- pt will f/u with Dr Wolfgang Phoenix as OP              -On Calcitrol, PhosLo    11/11- looking slightly better this AM 12: 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 Hx IBS but no further diarrhea   11/12- LBM last night 16: Right 4th toe laceration PTA: well-healed, discontinue sutures.    17. ? R biceps tear - documented per record in 2022 by Dr. Hilda Lias at [place]. Likely lost to follow up; would re-establish as OP.    18. Hx cervical stenosis s/p C4-T1 PCDF 2022, then C4/5 ACDF 11/2022 with Dr. Maisie Fus. Pain regimen as above.     19.  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

## 2023-09-15 NOTE — Progress Notes (Addendum)
Physical Therapy Session Note  Patient Details  Name: Curtis Hartman MRN: [id number] Date of Birth: 03/22/1961  Today's Date: 09/15/2023 PT Individual Time: 3474-2595; 1423 - 1506 PT Individual Time Calculation (min): 43 min; 43 min   Short Term Goals: Week 1:  PT Short Term Goal 1 (Week 1): Pt will perform bed mobility with overall CGA/ minA using bed features. PT Short Term Goal 2 (Week 1): Pt will perform sit<>stand transfers with overall CGA. PT Short Term Goal 3 (Week 1): Pt will perform stand pivot transfers with overall MinA. PT Short Term Goal 4 (Week 1): Pt will initiate gait training. PT Short Term Goal 5 (Week 1): Pt will initiate w/c mobility training.  SESSION 1 Skilled Therapeutic Interventions/Progress Updates: Patient supine in bed on entrance to room. Patient alert and agreeable to PT session.   Patient reported greater than 10/10 pain in low back (attending RN provided medication shortly after PTA arrival). Pt supine to sit on EOB via log roll and VC to maintain spinal precautions (pt recalled 3/3 precautions without assistance required). Pt sit to stand to RW with minA and HOB elevated for pt comfort due to pain reports (VC for RW proximity to remain close and to push off with one UE). Pt standing EOB in RW in order to doff old brief and don new one. Pt with soiled brief of urine and residual BM. Pt performed anterior pericare with PTA performing posterior peri-care with pt's self brought wipes with totalA due to pt reports of pain when attempting to do so in the past without aid. Pt also reported not being able to tell if brief is soiled or when pt voids urine. Pt noted to have some blood when wiping anterior area (L inner thigh noted to be red with rash-like presentation - small amount with no blood after 2nd wipe). RN notified with NT present throughout. Pt performed stand shuffle pivot in R from EOB to [facility] after having hospital pants and brief donned with CGA and VC to  reach back to find arm rest to control descent to [facility]. Pt transported to main gym dependently in [facility] for time management. Pt performed 2 x 12 LAQ in WC with 5lb ankle weights on B LE's. Pt required increased demonstrative and VC for breathing technique (inhale during eccentric, and exhale during concentric) and provided with education on increasing O2 saturation for increased endurance to task and musculature activation. Pt transported back to room in [facility] with pt reporting feeling B quadriceps after therex (no pain, only slight tingling).   Patient in [facility] at end of session with brakes locked, NT present, belt alarm set, and all needs within reach.  SESSION 2 Skilled Therapeutic Interventions/Progress Updates: Patient sitting in WC on entrance to room. Patient alert and agreeable to PT session.   Patient reported 10/10 pain (RN alerted to provided pain medication during therapy. Pt perseverated on pain throughout this session (primarily low back and B quadriceps with PTA informing pt that pt has increased muscular activation since surgery and depending on level of activity prior to admission, pt could potentially feel muscle soreness and "burning" sensation due to therex. Pt also reported that R UE presents with increase pain when performing flexion+internal rotation at the should (pt stated he noticed this when reaching for a bottle of ketchup with his thumb facing downward). Attending OT and MD notified of pt's report.   Therapeutic Activity: Bed Mobility: Pt performed sit to supine from EOB with CGA. VC required  for log roll technique. Transfers: Pt performed sit<>stand transfer at end of session with CGA from WC to EOB and in RW. Pt transported from room<>main gym for energy conservation.  Therapeutic Exercise: Pt performed the following exercises with therapist providing the described cuing and facilitation for improvement. - Seated HS curl with yellow theraband with VC for breathing technique and to  control eccentric. 2 x 12 (pt reported increased fatigue towards end of last set - no rest break required). Pt required rest break between sets and presented with decreased strength in R LE vs L LE with pt being aware of deficit.   Patient supine in bed at end of session with brakes locked, RN providing pain medication, bed alarm set, and all needs within reach.       Therapy Documentation Precautions:  Precautions Precautions: Back, Fall Precaution Comments: decreased sensation in RLE Required Braces or Orthoses: Spinal Brace Spinal Brace: Lumbar corset, Applied in sitting position Restrictions Weight Bearing Restrictions: No Other Position/Activity Restrictions: Pt required assist to don brace.  Therapy/Group: Individual Therapy  Catelynn Sparger PTA 09/15/2023, 11:47 AM

## 2023-09-15 NOTE — Progress Notes (Signed)
Physical Therapy Session Note  Patient Details  Name: Curtis Hartman MRN: [id number] Date of Birth: 1961-11-01  Today's Date: 09/15/2023 PT Individual Time: 4098-1191 PT Individual Time Calculation (min): 42 min   Short Term Goals: Week 1:  PT Short Term Goal 1 (Week 1): Pt will perform bed mobility with overall CGA/ minA using bed features. PT Short Term Goal 2 (Week 1): Pt will perform sit<>stand transfers with overall CGA. PT Short Term Goal 3 (Week 1): Pt will perform stand pivot transfers with overall MinA. PT Short Term Goal 4 (Week 1): Pt will initiate gait training. PT Short Term Goal 5 (Week 1): Pt will initiate w/c mobility training.  Skilled Therapeutic Interventions/Progress Updates:   Received pt sitting in WC, pt agreeable to PT treatment, and reported pain in low back but did not rate severity (premedicated). Session with emphasis on functional mobility/transfers, global strengthening and endurance, gait training, and toileting. Pt transported to/from room in [facility] dependently for time management purposes.   Pt performed all transfers with RW and min A throughout session - cues for hand placement. Pt ambulated 98ft x 1 and 54ft x 1 with RW and min A with WC follow - to fatigue. Pt reports most painful activity is going from sit<>stand or stand<>sit - encouraged pt to scoot to edge of chair prior to standing and to sit slowly and avoid uncontrolled descent. In dayroom, pt performed seated BLE strengthening on Kinetron at 30 cm/sec for 1 minute x 3 trials with emphasis on glute/quad strength. Pt requesting to go to giftshop and look for shorts, t-shirts, and pull ups in next session - afternoon OT/PTA notified.  Returned to room and pt reported urge to toilet. Ambulated 38ft with RW and CGA to bedside commode and required max A for clothing management. Pt requested ~20 minutes to sit on commode and left sitting on commode with all needs within reach. RN and NT notified of pt's  current status.   Therapy Documentation Precautions:  Precautions Precautions: Back, Fall Precaution Comments: decreased sensation in RLE Required Braces or Orthoses: Spinal Brace Spinal Brace: Lumbar corset, Applied in sitting position Restrictions Weight Bearing Restrictions: No Other Position/Activity Restrictions: Pt required assist to don brace.  Therapy/Group: Individual Therapy Marlana Salvage Zaunegger Blima Rich PT, DPT 09/15/2023, 7:15 AM

## 2023-09-16 ENCOUNTER — Inpatient Hospital Stay (HOSPITAL_COMMUNITY): Payer: Medicaid Other

## 2023-09-16 LAB — GLUCOSE, CAPILLARY
Glucose-Capillary: 114 mg/dL — ABNORMAL HIGH (ref 70–99)
Glucose-Capillary: 106 mg/dL — ABNORMAL HIGH (ref 70–99)
Glucose-Capillary: 144 mg/dL — ABNORMAL HIGH (ref 70–99)
Glucose-Capillary: 80 mg/dL (ref 70–99)

## 2023-09-16 MED ORDER — DICLOFENAC SODIUM 1 % EX GEL
2.0000 g | Freq: Four times a day (QID) | CUTANEOUS | Status: DC
  Administered 2023-09-16 – 2023-09-24 (×16): 2 g via TOPICAL
  Filled 2023-09-16: qty 100

## 2023-09-16 NOTE — Plan of Care (Signed)
  Problem: Consults Goal: RH SPINAL CORD INJURY PATIENT EDUCATION Description:  See Patient Education module for education specifics.  Outcome: Progressing   Problem: SCI BOWEL ELIMINATION Goal: RH STG MANAGE BOWEL WITH ASSISTANCE Description: STG Manage Bowel with min Assistance. Outcome: Progressing Goal: RH STG SCI MANAGE BOWEL PROGRAM W/ASSIST OR AS APPROPRIATE Description: STG SCI Manage bowel program w/min assist or as appropriate. Outcome: Progressing   Problem: SCI BLADDER ELIMINATION Goal: RH STG MANAGE BLADDER WITH MEDICATION WITH ASSISTANCE Description: STG Manage Bladder With Medication With min Assistance. Outcome: Progressing Goal: RH STG MANAGE BLADDER WITH EQUIPMENT WITH ASSISTANCE Description: STG Manage Bladder With Equipment With min Assistance Outcome: Progressing Goal: RH STG SCI MANAGE BLADDER PROGRAM W/ASSISTANCE Description: Patient will be able to manage bladder independently  Outcome: Progressing   Problem: RH SKIN INTEGRITY Goal: RH STG SKIN FREE OF INFECTION/BREAKDOWN Description: Incision will remain intact and free of infection/breakdown with min assist  Outcome: Progressing Goal: RH STG MAINTAIN SKIN INTEGRITY WITH ASSISTANCE Description: STG Maintain Skin Integrity With cueing Assistance. Outcome: Progressing Goal: RH STG ABLE TO PERFORM INCISION/WOUND CARE W/ASSISTANCE Description: STG Able To Perform Incision/Wound Care With min Assistance. Outcome: Progressing   Problem: RH SAFETY Goal: RH STG ADHERE TO SAFETY PRECAUTIONS W/ASSISTANCE/DEVICE Description: STG Adhere to Safety Precautions With cueing Assistance/Device. Outcome: Progressing Goal: RH STG DECREASED RISK OF FALL WITH ASSISTANCE Description: STG Decreased Risk of Fall With min Assistance. Outcome: Progressing   Problem: RH PAIN MANAGEMENT Goal: RH STG PAIN MANAGED AT OR BELOW PT'S PAIN GOAL Description: Less than 4 with PRN medications min assist  Outcome: Progressing    Problem: RH KNOWLEDGE DEFICIT SCI Goal: RH STG INCREASE KNOWLEDGE OF SELF CARE AFTER SCI Description: Patient/caregiver will be able to managed medications, adhere to back precautions, manage bladder and bowel activities, and diet/life style modifications to improve health from nursing education and nursing handouts independently  Outcome: Progressing

## 2023-09-16 NOTE — Progress Notes (Signed)
PROGRESS NOTE   Subjective/Complaints:  Pt reports Has severe R shoulder pain- doesn't want any injections since got 6 back injections 4 months ago- by Dr Luis Abed- pain mgmt- cannot find documentation of it   Pt asking about xray results and how to treat pain.   ROS- -   Pt denies SOB, abd pain, CP, N/V/C/D, and vision changes   Except for HPI Objective:   DG Shoulder Right  Result Date: 09/15/2023 CLINICAL DATA:  Right shoulder pain EXAM: RIGHT SHOULDER - 2+ VIEW COMPARISON:  None Available. FINDINGS: Mild AC joint degenerative change. Mild glenohumeral degenerative change. Questionable cortical step-off at the right humerus head neck junction. High-riding humeral head consistent with rotator cuff disease. IMPRESSION: 1. Questionable cortical step-off at the right humerus head neck junction, follow-up CT may be obtained if symptoms are suggestive of fracture. 2. High-riding humeral head consistent with rotator cuff disease. Degenerative changes Electronically Signed   By: Jasmine Pang M.D.   On: 09/15/2023 21:21   Recent Labs    09/14/23 0453  WBC 9.8  HGB 8.0*  HCT 24.9*  PLT 379   Recent Labs    09/14/23 0453  NA 135  K 5.0  CL 106  CO2 22  GLUCOSE 96  BUN 78*  CREATININE 4.75*  CALCIUM 8.5*    Intake/Output Summary (Last 24 hours) at 09/16/2023 0802 Last data filed at 09/16/2023 2130 Gross per 24 hour  Intake 832 ml  Output 1525 ml  Net -693 ml        Physical Exam: Vital Signs Blood pressure 122/72, pulse 81, temperature 99 F (37.2 C), temperature source Oral, resp. rate 16, height 5\' 9"  (1.753 m), weight 77.6 kg, SpO2 96%.      General: awake, alert, appropriate, sitting up in bed- 100% tray eaten; NAD HENT: conjugate gaze; oropharynx moist CV: regular rate and rhythm; no JVD Pulmonary: CTA B/L; no W/R/R- good air movement GI: soft, NT, ND, (+)BS- normoactive Psychiatric: appropriate-  irritable Neurological: Ox3 MSK- Cannot get R arm into empty can test position- at 90 degrees- can get to ~ 70-80 degrees- but when try to test- Has Sx's c/w RTC tear AS WELL as biceps tendinitis- TTP anteriorly/posteriorly.    Skin: No evidence of breakdown, no evidence of rash   Neurologic: Cranial nerves II through XII intact, motor strength is 5/5 in bilateral deltoid, bicep, tricep, grip,3-/5 B  hip flexor, knee extensors,4-  ankle dorsiflexor and plantar flexor Sensory exam reduced LT below ankle on the right side and below supramalleolar area on left side  R>L hand intrinsic atrophy  Musculoskeletal: Full range of motion in all 4 extremities. No joint swelling   Assessment/Plan: 1. Functional deficits which require 3+ hours per day of interdisciplinary therapy in a comprehensive inpatient rehab setting. Physiatrist is providing close team supervision and 24 hour management of active medical problems listed below. Physiatrist and rehab team continue to assess barriers to discharge/monitor patient progress toward functional and medical goals  Care Tool:  Bathing    Body parts bathed by patient: Right arm, Left arm, Chest, Abdomen, Front perineal area, Face   Body parts bathed by helper: Right upper leg, Left  upper leg, Right lower leg, Left lower leg, Buttocks     Bathing assist Assist Level: Maximal Assistance - Patient 24 - 49%     Upper Body Dressing/Undressing Upper body dressing        Upper body assist Assist Level: Minimal Assistance - Patient > 75%    Lower Body Dressing/Undressing Lower body dressing      What is the patient wearing?: Underwear/pull up, Pants     Lower body assist Assist for lower body dressing: Maximal Assistance - Patient 25 - 49%     Toileting Toileting    Toileting assist Assist for toileting: Maximal Assistance - Patient 25 - 49%     Transfers Chair/bed transfer  Transfers assist     Chair/bed transfer assist level:  Minimal Assistance - Patient > 75%     Locomotion Ambulation   Ambulation assist   Ambulation activity did not occur: Safety/medical concerns  Assist level: Minimal Assistance - Patient > 75% Assistive device: Walker-rolling Max distance: 15ft   Walk 10 feet activity   Assist  Walk 10 feet activity did not occur: Safety/medical concerns  Assist level: Minimal Assistance - Patient > 75% Assistive device: Walker-rolling   Walk 50 feet activity   Assist Walk 50 feet with 2 turns activity did not occur: Safety/medical concerns  Assist level: Minimal Assistance - Patient > 75% Assistive device: Walker-rolling    Walk 150 feet activity   Assist Walk 150 feet activity did not occur: Safety/medical concerns         Walk 10 feet on uneven surface  activity   Assist Walk 10 feet on uneven surfaces activity did not occur: Safety/medical concerns         Wheelchair     Assist Is the patient using a wheelchair?: Yes Type of Wheelchair: Manual    Wheelchair assist level: Dependent - Patient 0% Max wheelchair distance: 10 ft    Wheelchair 50 feet with 2 turns activity    Assist        Assist Level: Dependent - Patient 0%   Wheelchair 150 feet activity     Assist      Assist Level: Dependent - Patient 0%   Blood pressure 122/72, pulse 81, temperature 99 F (37.2 C), temperature source Oral, resp. rate 16, height 5\' 9"  (1.753 m), weight 77.6 kg, SpO2 96%.  Medical Problem List and Plan: 1. Functional deficits secondary to lumbar stenosis with radiculopathy s/p  L2-L5 DLIF with instrumentation 10/29 with Dr. Maisie Fus             -patient may shower             -ELOS/Goals: 10-14 days, Mod I PT/OT             Con't CIR PT and OT D/c 11/21  Really limited by pain esp R shoulder pain 2.  Antithrombotics: -DVT/anticoagulation:  Pharmaceutical: Heparin             -antiplatelet therapy: none   3. Pain Management: Tylenol, Robaxin, oxycodone  as needed             -Continue gabapentin 100 mg twice daily Cont oxy IR 10mg  q 4 prn, if somnolent consider change to hydromorphone given renal status    11/11- not lethargic- so will maintain dosing for now  11/12- having more pain due to soreness from therapies- will maintain regimen- has Robaxin as needed for muscle spasms  11/13- got Xray of R shoulder- shows signs of RTC-  but pt also having concern about fx?-xray results suggested a CT- but computer wanted me to get MRI- R shoulder MRI ordered- will add Voltaren gel 2g QID to try and help inflammation.  4. Mood/Behavior/Sleep: LCSW to evaluate and provide emotional support             -antipsychotic agents: n/a   5. Neuropsych/cognition: This patient is capable of making decisions on his own behalf.   6. Skin/Wound Care: Routine skin care checks              - R toe sutures removed on admission, WOC order for betadyne BID   7. Fluids/Electrolytes/Nutrition: Strict Is and Os and follow-up chemistries   8: Hypertension: monitor TID and prn; appears stable on oral agents             -continue amlodipine 10 mg daily             -continue carvedilol 3.125 mg twice daily Vitals:   09/15/23 1940 09/16/23 0511  BP: 133/67 122/72  Pulse: 78 81  Resp: 18 16  Temp: 98 F (36.7 C) 99 F (37.2 C)  SpO2: 98% 96%  Controlled 11/9  11/11- BP controlled in last 24 hours, but was real high yesterday early AM- however nothing like that since- not sure if was correct? Not clear   11/12-11/13 BP controlled- con't regimen 9: Diabetes mellitus: CBGs 4 times daily; A1c = 5.7%             -continue sliding scale insulin 0 to 15 units             -? resume home Lantus 5 units daily   CBG (last 3)  Recent Labs    09/15/23 1722 09/15/23 2104 09/16/23 0611  GLUCAP 84 115* 114*   Controlled 11/10  11/11- CBGs controlled- look good-c on't regimen  11/12- not on Home dose Lantus, but CBGs look great- will not rstart right now 10: Urinary  retention, acute, h/o BPH:              -had indwelling Foley catheter -continue Flomax 0.4 mg daily  -Foley placed 10/30; DC foley trial with ISC Q6H for PVR >350 ccs to start in AM 11/9   11/11- foley out-= last PVR 108cc- will monitor with bladder scans 11: AKI/CKD stage IV: Nearing need for hemodialysis             -Nephrology signed off, he has had LUE AV fistula created 11/7 (follow-up arranged with [facility])- pt will f/u with Dr Wolfgang Phoenix as OP              -On Calcitrol, PhosLo    11/11- looking slightly better this AM 12: 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 Hx IBS but no further diarrhea   11/12- LBM last night 16: Right 4th toe laceration PTA: well-healed, discontinue sutures.    17. ? R biceps tear - documented per record in 2022 by Dr. Hilda Lias at [place]. Likely lost to follow up; would re-establish as OP.  18. Hx cervical stenosis s/p C4-T1 PCDF 2022, then C4/5 ACDF 11/2022 with Dr. Maisie Fus. Pain regimen as above.     19.  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

## 2023-09-16 NOTE — Progress Notes (Signed)
Occupational Therapy Session Note  Patient Details  Name: Curtis Hartman MRN: [id number] Date of Birth: 1961-09-28  Today's Date: 09/16/2023 OT Individual Time: 4098-1191 OT Individual Time Calculation (min): 72 min    Short Term Goals: Week 1:  OT Short Term Goal 1 (Week 1): Pt will amb to bathroom with rollator with CGA for toilet and shower bench access OT Short Term Goal 2 (Week 1): Pt will state 3/3 spinal precuations with no cues OT Short Term Goal 3 (Week 1): Pt will utilize AE to perform simple LB dressing with CGA  Skilled Therapeutic Interventions/Progress Updates:      Therapy Documentation Precautions:  Precautions Precautions: Back, Fall Precaution Comments: decreased sensation in RLE Required Braces or Orthoses: Spinal Brace Spinal Brace: Lumbar corset, Applied in sitting position Restrictions Weight Bearing Restrictions: No Other Position/Activity Restrictions: Pt required assist to don brace. General: "I am glad you reminded me of a shower." Pt supine in bed upon OT arrival, agreeable to OT session.  Pain:  10/10 pain reported in low back and shoulders, activity, intermittent rest breaks, distractions provided for pain management, pt reports tolerable to proceed.    ADL: Bed mobility: Min A, assistance for trunk support with VC for log roll Toilet transfer: CGA ambulating from bed>BSC over toilet Toileting: CGA when standing to complete hygiene after BM, can complete pants management UB dressing: SBA donning/doffing overhead shirt seated EOB LB dressing: CGA for donning  Footwear: SBA using sock aide  Shower transfer: CGA ambulating from toilet>shower Bathing: CGA overall, CGA to support in standing to wash peri area, able to wash/rinse/dry all body parts Transfers: CGA for ambulation in room with RW, no SOB/LOB  Other Treatments: OT applied KT tape to right shoulder in order to provide stability and decrease pain with use. Pt educated on risks and  benefits of tape.   Pt supine in bed with bed alarm activated, 2 bed rails up, call light within reach and 4Ps assessed.   Therapy/Group: Individual Therapy  Velia Meyer, OTD, OTR/L 09/16/2023, 4:20 PM

## 2023-09-16 NOTE — Patient Care Conference (Signed)
Inpatient RehabilitationTeam Conference and Plan of Care Update Date: 09/15/2023    Time: 11:29 AM    Patient Name: Curtis Hartman      Medical Record Number: [id number]  Date of Birth: 29-Apr-1961 Sex: Male         Room/Bed: 4M07C/4M07C-01 Payor Info: Payor: [place] MEDICAID PREPAID HE[employer] PLAN / Plan: [place] MEDICAID [facility] / Product Type: *No Product type* /    Admit Date/Time:  09/11/2023  1:37 PM  Primary Diagnosis:  Lumbar stenosis  Hospital Problems: Principal Problem:   Lumbar stenosis    Expected Discharge Date: Expected Discharge Date: 09/24/23  Team Members Present: Physician leading conference: Dr. Genice Rouge Social Worker Present: Dossie Der, LCSW Nurse Present: Vedia Pereyra, RN PT Present: Truitt Leep, PT OT Present: Velia Meyer, OT     Current Status/Progress Goal Weekly Team Focus  Bowel/Bladder    Continent of bowel/bladder   Remain continent of bowel/bladder    Time toileting for bladder/bowel hygiene     Swallow/Nutrition/ Hydration               ADL's   max A LB, min A UB, min A transfers, max A TLSO donning   SBA overall   pain management, activity tolerance, health management    Mobility   CGA/min transfers with RW, CGA gait x 50 ft (limited 2/2 pain)   supervision, CGA gait, min A steps  pt/education energy conservation    Communication                Safety/Cognition/ Behavioral Observations               Pain    Increasing pain with PRN and scheduled pain medications   Pain less than 4 with prn, scheduled, and alternatives methods  Better pain control without medication changes   Skin    Incision to back without drainage   Incision to remain intact without s/s of infection  Focus on application of TLSO brace and monitor skin/pressure areas for changes     Discharge Planning:  Home with younger sister and her boyfriend she is there if needed to assist him. he has done OP in [place] after neck surgeries.   Team  Discussion: Lumbar stenosis. LBM 09/14/23. C/O 10/10 pain to back. Incision to back without drainage. Right flank incision without drainage. Left arm incision without drainage. Sutures removed from incision site to right foot. Blood sugars on lower side with A1C of 5.7. Elevated potassium- trending down. Tolerating carb-mod diet. Daily weights. Limited by pain and self limiting behaviors.  Patient on target to meet rehab goals: yes, continues to progress towards goals with discharge date of 09/24/23  *See Care Plan and progress notes for long and short-term goals.   Revisions to Treatment Plan:  Neur-psych for better pain control and realistic pain goals. Home [place] not restarted. Monitor metabolites. Monitor labs and VS Teaching Needs: Medications, safety, self care, gait/transfer training, application of TLSO brace, incision/wound care, etc.   Current Barriers to Discharge: Decreased caregiver support, Wound care, and Medication compliance  Possible Resolutions to Barriers: Family education Independent with skin/wound care Independent with TLSO brace don/doff Improved pain and mood/behaviors Order recommended DME     Medical Summary Current Status: Having increased pain -asking for pain meds all the time-  PVRs 100-150cc; voiding; incision looks OK- L arm fistula- looks ok  Barriers to Discharge: Weight bearing restrictions;Uncontrolled Pain;Self-care education;Medical stability;Behavior/Mood;Neurogenic Bowel & Bladder;Renal Insufficiency/Failure  Barriers to Discharge Comments: limited by  self and pain overall- goals supervision- is CGA right now-for mobility and mod-max LB- has TLSO- Possible Resolutions to [employer]: focusing in energy conservation- and pain control without changing meds- will get Neuropsych to see pt-- d/c 11/21-   Continued Need for Acute Rehabilitation Level of Care: The patient requires daily medical management by a physician with specialized  training in physical medicine and rehabilitation for the following reasons: Direction of a multidisciplinary physical rehabilitation program to maximize functional independence : Yes Medical management of patient stability for increased activity during participation in an intensive rehabilitation regime.: Yes Analysis of laboratory values and/or radiology reports with any subsequent need for medication adjustment and/or medical intervention. : Yes   I attest that I was present, lead the team conference, and concur with the assessment and plan of the team.   Jearld Adjutant 09/16/2023, 1:19 PM

## 2023-09-16 NOTE — Progress Notes (Signed)
Occupational Therapy Session Note  Patient Details  Name: Curtis Hartman MRN: [id number] Date of Birth: September 02, 1961  Today's Date: 09/16/2023 OT Individual Time: 1015-1030 OT Individual Time Calculation (min): 15 min  and Today's Date: 09/16/2023 OT Missed Time: 45 Minutes Missed Time Reason: CT/MRI   Short Term Goals: Week 1:  OT Short Term Goal 1 (Week 1): Pt will amb to bathroom with rollator with CGA for toilet and shower bench access OT Short Term Goal 2 (Week 1): Pt will state 3/3 spinal precuations with no cues OT Short Term Goal 3 (Week 1): Pt will utilize AE to perform simple LB dressing with CGA  Skilled Therapeutic Interventions/Progress Updates:    Pt resting in recliner upon arrival. Pain per below. Sit<>stand x 4 with increased pain at CGA. Transport arrived to take pt for MRI. Amb with RW to EOB-CGA. Sit>supine with supervision. Pt remained in bed. Transport attending. Pt missed 45 mins skilled OT services. Will attempt to see as schedule allows.   Therapy Documentation Precautions:  Precautions Precautions: Back, Fall Precaution Comments: decreased sensation in RLE Required Braces or Orthoses: Spinal Brace Spinal Brace: Lumbar corset, Applied in sitting position Restrictions Weight Bearing Restrictions: No Other Position/Activity Restrictions: Pt required assist to don brace. General: General OT Amount of Missed Time: 45 Minutes    Pain: Pt reports 9/10 back pain; repositioned and returned to bed  Therapy/Group: Individual Therapy  Rich Brave 09/16/2023, 11:03 AM

## 2023-09-16 NOTE — Progress Notes (Signed)
Physical Therapy Note  Patient Details  Name: Curtis Hartman MRN: [id number] Date of Birth: [birth year] Today's Date: 09/16/2023    Pt off the floor for MRI/CT. Will attempt to make up missed time as able - missed 30 minutes of therapy.   Desirey Keahey P Kellen Hover PT, DTP, CSRS 09/16/2023, 11:42 AM

## 2023-09-16 NOTE — Progress Notes (Signed)
Physical Therapy Session Note  Patient Details  Name: Curtis Hartman MRN: [id number] Date of Birth: 1961-09-22  Today's Date: 09/16/2023 PT Individual Time: 0920-1006 PT Individual Time Calculation (min): 46 min   Short Term Goals: Week 1:  PT Short Term Goal 1 (Week 1): Pt will perform bed mobility with overall CGA/ minA using bed features. PT Short Term Goal 2 (Week 1): Pt will perform sit<>stand transfers with overall CGA. PT Short Term Goal 3 (Week 1): Pt will perform stand pivot transfers with overall MinA. PT Short Term Goal 4 (Week 1): Pt will initiate gait training. PT Short Term Goal 5 (Week 1): Pt will initiate w/c mobility training.  Skilled Therapeutic Interventions/Progress Updates:      Therapy Documentation Precautions:  Precautions Precautions: Back, Fall Precaution Comments: decreased sensation in RLE Required Braces or Orthoses: Spinal Brace Spinal Brace: Lumbar corset, Applied in sitting position Restrictions Weight Bearing Restrictions: No Other Position/Activity Restrictions: Pt required assist to don brace.   Pt agreeable to PT session with emphasis on global strengthening and NMR with transfers and gait with RW. Pt with >10 pain, nurse notified and administer medication in session. Pt reports need to void, CGA/supervision with sit<>stand with min cues for hand placement as PT held position of urinal, documented in flowsheet. Pt ambulated 120 ft + 150 ft with RW with CGA/close supervision. Pt re-educated on energy conservation strategies and to incorporate rests to decrease delayed soreness. Pt completed session with bicep curls in supinated and neutral grip bilaterally with 3# dumb bell. Pt CGA with ambulation to toilet with riser and presents with gas, no bowel movement. Pt left seated in recliner at bedside with all needs in reach and alarm on.    Therapy/Group: Individual Therapy  Truitt Leep Truitt Leep PT, DPT  09/16/2023, 12:39 PM

## 2023-09-17 DIAGNOSIS — F54 Psychological and behavioral factors associated with disorders or diseases classified elsewhere: Secondary | ICD-10-CM

## 2023-09-17 LAB — GLUCOSE, CAPILLARY
Glucose-Capillary: 97 mg/dL (ref 70–99)
Glucose-Capillary: 109 mg/dL — ABNORMAL HIGH (ref 70–99)
Glucose-Capillary: 127 mg/dL — ABNORMAL HIGH (ref 70–99)
Glucose-Capillary: 98 mg/dL (ref 70–99)

## 2023-09-17 MED ORDER — DOCUSATE SODIUM 100 MG PO CAPS
100.0000 mg | ORAL_CAPSULE | Freq: Two times a day (BID) | ORAL | Status: DC | PRN
Start: 2023-09-17 — End: 2023-09-24

## 2023-09-17 MED ORDER — FLUTICASONE PROPIONATE 50 MCG/ACT NA SUSP
2.0000 | Freq: Every day | NASAL | Status: DC
  Administered 2023-09-17 – 2023-09-24 (×8): 2 via NASAL
  Filled 2023-09-17: qty 16

## 2023-09-17 NOTE — Progress Notes (Signed)
Occupational Therapy Session Note  Patient Details  Name: Curtis Hartman MRN: [id number] Date of Birth: 21-Nov-1960  Today's Date: 09/17/2023 OT Individual Time: 0845-1000 OT Individual Time Calculation (min): 75 min    Short Term Goals: Week 1:  OT Short Term Goal 1 (Week 1): Pt will amb to bathroom with rollator with CGA for toilet and shower bench access OT Short Term Goal 2 (Week 1): Pt will state 3/3 spinal precuations with no cues OT Short Term Goal 3 (Week 1): Pt will utilize AE to perform simple LB dressing with CGA  Skilled Therapeutic Interventions/Progress Updates:      Therapy Documentation Precautions:  Precautions Precautions: Back, Fall Precaution Comments: decreased sensation in RLE Required Braces or Orthoses: Spinal Brace Spinal Brace: Lumbar corset, Applied in sitting position Restrictions Weight Bearing Restrictions: No Other Position/Activity Restrictions: Pt required assist to don brace. General: "I really want to get better" Pt supine in bed receiving meds from nursing upon OT arrival, agreeable to OT session.  Pain:  7/10 pain reported in Rt thigh, activity, intermittent rest breaks, distractions provided for pain management, pt reports tolerable to proceed.   ADL: Bed mobility: SBA supine>EOB with bed rail Grooming: SBA seated in W/C for shaving face, increased time required for task UB dressing: SBA seated in W/C donning overhead shirt Footwear: SBA with sock aide to don grip socks Transfers: CGA ambulating with RW from bed>W/C near sink ~10 ft no LOB/SOB  Exercises: Pt completed 5 minutes of  nu step bike in order to increase BUE/BLEstrength and endurance in preparation for increased independence in ADLs such as bathing. No rest breaks on level 4 resistance.  Other Treatments: Pt completed 2x10 wall slides up/down with bilateral arms in order to complete stretching to decrease pain and increase ROM. Pt reported feeling better after completing  stretches. OT instructed to complete stretches while in room with gravity eliminated position.    Pt supine in bed with bed alarm activated, 2 bed rails up, call light within reach and 4Ps assessed.   Therapy/Group: Individual Therapy  Velia Meyer, OTD, OTR/L 09/17/2023, 3:59 PM

## 2023-09-17 NOTE — Progress Notes (Signed)
Physical Therapy Session Note  Patient Details  Name: Curtis Hartman MRN: [id number] Date of Birth: Mar 25, 1961  Today's Date: 09/17/2023 PT Individual Time: 4098-1191 PT Individual Time Calculation (min): 11 min   Short Term Goals: Week 1:  PT Short Term Goal 1 (Week 1): Pt will perform bed mobility with overall CGA/ minA using bed features. PT Short Term Goal 2 (Week 1): Pt will perform sit<>stand transfers with overall CGA. PT Short Term Goal 3 (Week 1): Pt will perform stand pivot transfers with overall MinA. PT Short Term Goal 4 (Week 1): Pt will initiate gait training. PT Short Term Goal 5 (Week 1): Pt will initiate w/c mobility training.  Skilled Therapeutic Interventions/Progress Updates:      Pt lying in bed - wasn't aware that he was scheduled for 1pm therapy time. Patient requesting to use the bathroom to have a BM. Bed mobility completed with supervision with HOB slightly elevated. Sit<>stand to RW with supervision and ambulates with supervision in his room to the bathroom. Placed BSC over toilet to help elevate it and provide better support. Pt requesting privacy and ++ time to allow him to have BM. Made NT aware of patient position. He missed 64 minutes for toileting needs.   \  Therapy Documentation Precautions:  Precautions Precautions: Back, Fall Precaution Comments: decreased sensation in RLE Required Braces or Orthoses: Spinal Brace Spinal Brace: Lumbar corset, Applied in sitting position Restrictions Weight Bearing Restrictions: No Other Position/Activity Restrictions: Pt required assist to don brace. General:     Therapy/Group: Individual Therapy  Curtis Hartman 09/17/2023, 7:53 AM

## 2023-09-17 NOTE — Consult Note (Signed)
Neuropsychological Consultation Comprehensive Inpatient Rehab   Patient:   Curtis Hartman   DOB:   1961/03/07  MR Number:  [id number]  Location:  [facility] [address] [place] [address] Dept: [phone] Loc: [phone]           Date of Service:   09/16/2023  Start Time:   3 PM End Time:   4 PM  Provider/Observer:  Arley Phenix, Psy.D.       Clinical Neuropsychologist       Billing Code/Service: [phone]  Reason for Service:    Curtis Hartman is a 62 year old male referred for neuropsychological consultation during his ongoing admission onto the comprehensive inpatient rehabilitation unit.  Patient has a past history of cervical neurosurgeries and now has had a lumbar neurosurgical intervention conducted due to stenosis with decompressive interventions.  Patient was referred due to concerns around his very flat affect and level of engagement and effort.  During today's visit, the patient presented laying in his bed with minimal engagement other than very basic interactions.  The patient did not have any questions to ask and essentially described and behaved in ways as if he was approaching his rehab in a "been there done that" fashion.  Patient had previous therapies after cervical interventions.  Patient denied any significant depression or anxiety or difficulties with engaging in therapeutic interventions and denied significant difficulties with motivation and effort.  It was very difficult to develop rapport with the patient but I suspect that some of this has to do with longstanding behavioral patterns and styles and there is likely to be little change during his CIR admission.  He was not adversarial or negative in any way but also was very blunt and stoic.  HPI for the current admission:    HPI: Curtis Hartman is a 62 year old male with a history of C3-C7 PCD F who developed progressive  mechanical back pain and bilateral radicular symptoms refractory to medical therapy, physical therapy and pain management.  He is followed by Dr. Hoyt Koch.  Discussed recommendations for L2-3, L3-4, L4-5 DLIF, posterior percutaneous fusion and possible MIS posterior decompression.  The patient consented and was taken to the operating room on 09/01/2023 and underwent direct lumbar interbody fusion L2-3, L3-4, L4-5, right prone Tran psoas approach, L2-L5 posterior percutaneous fusion and L4-L5 minimally invasive laminectomy and foraminotomies.  He also performed L2-5 lumbar percutaneous pedicle screw.  Stable postoperatively with recommendations to apply LSO when out of bed.  He had acute postoperative hypertensive urgency, AKI on CKD, urinary retention and Foley was placed.  Likely benign prostatic hypertrophy and placed on Flomax. TRH consulted for chronic kidney disease and elevated creatinine.  Follows as an outpatient with Dr. Wolfgang Phoenix.  Losartan discontinued and kidney ultrasound and urine studies ordered.  Nephrology consulted.  No uremic symptoms or indications for dialysis, however he is nearing need for hemodialysis and vascular surgery was consulted for creation of AV fistula.  On subcutaneous heparin for DVT prophylaxis.  Dr. Maurice Small discussed with him on 11/02 that his psoas weakness is expected and approach related and should improve with time.  On 11/07 he was taken to the operating room where he underwent left brachiocephalic arteriovenous fistula by Dr. Hetty Blend.  Was transfused 1 unit of packed red blood cells for hemoglobin of 7.0.  Currently receiving Venofer.The patient requires inpatient medicine and rehabilitation evaluations and services for ongoing dysfunction secondary to stenosis.  The patient lives in a single-story home in [place] with proxy 2 steps to enter, without a railing.  He lives with his younger sister and her boyfriend.  He used a rollator for ambulation at  baseline.   Medical History:   Past Medical History:  Diagnosis Date   Arthritis    CKD (chronic kidney disease)    COPD (chronic obstructive pulmonary disease) (HCC)    Diabetes mellitus without complication (HCC)    type 2   GERD (gastroesophageal reflux disease)    Headache    Hx Migraines   Hepatitis    Hypertension    Neuropathy          Patient Active Problem List   Diagnosis Date Noted   Coping style affecting medical condition 09/17/2023   Lumbar stenosis 09/11/2023   Lumbar spinal stenosis 09/01/2023   COPD GOLD ? 06/10/2023   Cigarette smoker 06/10/2023   S/P cervical spinal fusion 11/05/2022   Chronic hepatitis C (HCC) 06/25/2022   IBS (irritable bowel syndrome) 06/25/2022   GERD (gastroesophageal reflux disease) 06/25/2022   Impaired mobility and ADLs 11/05/2021   CKD (chronic kidney disease) stage 3, GFR 30-59 ml/min (HCC) 10/29/2021   Hypertension 10/29/2021   T2DM (type 2 diabetes mellitus) (HCC) 10/29/2021   Cervical myelopathy with cervical radiculopathy (HCC) 09/23/2021    Behavioral Observation/Mental Status:   Curtis Hartman  presents as a 62 y.o.-year-old Right handed Caucasian Male who appeared his stated age. his dress was Appropriate and he was Well Groomed and his manners were Appropriate to the situation.  his participation was indicative of Appropriate and Attentive behaviors.  There were physical disabilities noted.  he displayed an appropriate level of cooperation and motivation.    Interactions:    Active Resistant  Attention:   within normal limits and attention span and concentration were age appropriate  Memory:   within normal limits; recent and remote memory intact  Visuo-spatial:   not examined  Speech (Volume):  low  Speech:   normal; normal  Thought Process:  Circumstantial  Coherent and Directed  Though Content:  WNL; not suicidal and not homicidal  Orientation:   person, place, time/date, and  situation  Judgment:   Fair  Planning:   Fair  Affect:    Blunted  Mood:    Dysphoric  Insight:   Good  Intelligence:   normal  Psychiatric History:  No prior psychiatric history noted   Family Med/Psych History:  Family History  Problem Relation Age of Onset   Heart disease Mother    Seizures Father    Stroke Maternal Grandfather    Liver disease Paternal Grandmother    Impression/DX:   Curtis Hartman is a 62 year old male referred for neuropsychological consultation during his ongoing admission onto the comprehensive inpatient rehabilitation unit.  Patient has a past history of cervical neurosurgeries and now has had a lumbar neurosurgical intervention conducted due to stenosis with decompressive interventions.  Patient was referred due to concerns around his very flat affect and level of engagement and effort.  During today's visit, the patient presented laying in his bed with minimal engagement other than very basic interactions.  The patient did not have any questions to ask and essentially described and behaved in ways as if he was approaching his rehab in a "been there done that" fashion.  Patient had previous therapies after cervical interventions.  Patient denied any significant depression or anxiety or difficulties with engaging in therapeutic interventions and  denied significant difficulties with motivation and effort.  It was very difficult to develop rapport with the patient but I suspect that some of this has to do with longstanding behavioral patterns and styles and there is likely to be little change during his CIR admission.  He was not adversarial or negative in any way but also was very blunt and stoic.         Electronically Signed   _______________________ Arley Phenix, Psy.D. Clinical Neuropsychologist

## 2023-09-17 NOTE — Progress Notes (Signed)
Orthopedic Tech Progress Note Patient Details:  Curtis Hartman [birth year] [id number]  Ortho Devices Type of Ortho Device: Soft collar Ortho Device/Splint Interventions: Ordered, Application, Adjustment   Post Interventions Patient Tolerated: Well  Tonye Pearson 09/17/2023, 3:40 PM

## 2023-09-17 NOTE — Progress Notes (Signed)
Physical Therapy Session Note  Patient Details  Name: Curtis Hartman MRN: [id number] Date of Birth: 11/14/1960  Today's Date: 09/17/2023 PT Individual Time: 4098-1191 PT Individual Time Calculation (min): 60 min   Short Term Goals: Week 1:  PT Short Term Goal 1 (Week 1): Pt will perform bed mobility with overall CGA/ minA using bed features. PT Short Term Goal 2 (Week 1): Pt will perform sit<>stand transfers with overall CGA. PT Short Term Goal 3 (Week 1): Pt will perform stand pivot transfers with overall MinA. PT Short Term Goal 4 (Week 1): Pt will initiate gait training. PT Short Term Goal 5 (Week 1): Pt will initiate w/c mobility training.  Skilled Therapeutic Interventions/Progress Updates: Pt presented in bed agreeable to therapy with encouragement. Pt indicated unrated pain as well as fatigue as pt had recently completed OT session. Rest breaks provided throughout session as needed. Pt completed bed mobility with cues for spinal precautions CGA with use of bed features and increased effort. Pt required minA for Sit to stand and ambulated ~189ft with CGA and cues to stay within RW particularly during turns to ortho gym. Pt then participated in seated LE with 1.5lb ankle cuff LAQ 3x10, seated hip flexion 3x10, hip ER with green theraband 3 x 10, and hamstring pulls with green theraband 3 x 10. Therapist noted decreased endurance and slower pacing as completing hamstring pulls which pt attributed to fatigue. Pt completed ambulatory transfer to high/low mat with minA. Pt completed Sit to stand from elevated mat x 5 for posterior BLE strengthening. Pt performed toe taps to target for hip flexion activation and balance. Pt transferred back to w/c and transported to room. Completed ambulatory transfer to bed from w/c with CGA overall. At EOB completed sit to supine with supervision and use of bed features. Pt left in bed at end of session with bed alarm on, call bell within reach and needs met.       Therapy Documentation Precautions:  Precautions Precautions: Back, Fall Precaution Comments: decreased sensation in RLE Required Braces or Orthoses: Spinal Brace Spinal Brace: Lumbar corset, Applied in sitting position Restrictions Weight Bearing Restrictions: No Other Position/Activity Restrictions: Pt required assist to don brace. General: PT Amount of Missed Time (min): 64 Minutes PT Missed Treatment Reason: Toileting Vital Signs: Therapy Vitals Temp: 98.2 F (36.8 C) Pulse Rate: 73 Resp: 17 BP: (!) 127/58 Patient Position (if appropriate): Lying Oxygen Therapy SpO2: 100 % O2 Device: Room Air Pain: Pain Assessment Pain Scale: 0-10 Pain Score: 8  Pain Type: Surgical pain;Chronic pain Pain Location: Back Pain Orientation: Mid Pain Descriptors / Indicators: Aching Pain Frequency: Constant Pain Onset: On-going Patients Stated Pain Goal: 3 Pain Intervention(s): Medication (See eMAR)   Therapy/Group: Individual Therapy  Quantae Martel 09/17/2023, 4:04 PM

## 2023-09-17 NOTE — Progress Notes (Addendum)
Patient states he has brought nasal spray from home in his patient's belongings that he can use for stuffy nose. Patient educated that he cannot take at home medications unless a provider has written a order allowing him to do so. Patient verbalized understanding. Patient denies shortness of breath, cough, or congestion at this time. Pain medication administered for back pain. Vital signs are within normal parameters. Provider to be notified.

## 2023-09-17 NOTE — Progress Notes (Signed)
PROGRESS NOTE   Subjective/Complaints:  Pt asking why, if has Chronic tear of R shoulder- basically all RTC muscles why it started hurting acutely- we discussed how therapy probably set it off, but is at least 6-12 months old based on MRI- went over results of MRI with pt-   Also, 2 days ina  row has had bowel accidents in bed due to urgency-   Voltaren gel and K taping really helpful for R shoulder pain- using RUE much more and not hurting.   Legs still hurting- the muscles are so sore .  Also asking to use home Flonase- explained can order from here.  ROS- -   Pt denies SOB, abd pain, CP, N/V/C/D, and vision changes R shoulder pain- better   Except for HPI Objective:   MR SHOULDER RIGHT WO CONTRAST  Result Date: 09/16/2023 CLINICAL DATA:  Acute on chronic right shoulder pain. EXAM: MRI OF THE RIGHT SHOULDER WITHOUT CONTRAST TECHNIQUE: Multiplanar, multisequence MR imaging of the shoulder was performed. No intravenous contrast was administered. COMPARISON:  Right shoulder x-rays from yesterday. FINDINGS: Rotator cuff: Full-thickness, full width tears of the supraspinatus and infraspinatus tendons with retraction to the glenoid. Full-thickness, partial width tear involving the superior subscapularis tendon. Intact teres minor tendon. Muscles: Diffuse supraspinatus, infraspinatus, and subscapularis muscle edema with mild fatty infiltration. Normal teres minor muscle. Biceps long head:  Completely torn and retracted into the upper arm. Acromioclavicular Joint: Mild arthropathy of the acromioclavicular joint. Small amount of fluid in the subacromial/subdeltoid bursa. Glenohumeral Joint: Small joint effusion. Scattered partial and full-thickness cartilage loss over the humeral head and glenoid. Labrum: Diffusely degenerated and torn, sparing the inferior labrum. Bones: High-riding humeral head. No acute fracture or dislocation. No  suspicious bone lesion. Other: None. IMPRESSION: 1. Full-thickness, full width tears of the supraspinatus and infraspinatus tendons with retraction to the glenoid. Full-thickness, partial width tear involving the superior subscapularis tendon. Diffuse supraspinatus, infraspinatus, and subscapularis muscle edema with mild fatty infiltration. 2. Completely torn and retracted long head biceps tendon. 3. 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 Electronically Signed   By: Obie Dredge M.D.   On: 09/16/2023 16:12   DG Shoulder Right  Result Date: 09/15/2023 CLINICAL DATA:  Right shoulder pain EXAM: RIGHT SHOULDER - 2+ VIEW COMPARISON:  None Available. FINDINGS: Mild AC joint degenerative change. Mild glenohumeral degenerative change. Questionable cortical step-off at the right humerus head neck junction. High-riding humeral head consistent with rotator cuff disease. IMPRESSION: 1. Questionable cortical step-off at the right humerus head neck junction, follow-up CT may be obtained if symptoms are suggestive of fracture. 2. High-riding humeral head consistent with rotator cuff disease. Degenerative changes Electronically Signed   By: Jasmine Pang M.D.   On: 09/15/2023 21:21   No results for input(s): "WBC", "HGB", "HCT", "PLT" in the last 72 hours.  No results for input(s): "NA", "K", "CL", "CO2", "GLUCOSE", "BUN", "CREATININE", "CALCIUM" in the last 72 hours.   Intake/Output Summary (Last 24 hours) at 09/17/2023 0845 Last data filed at 09/17/2023 7829 Gross per 24 hour  Intake 480 ml  Output 1520 ml  Net -1040 ml        Physical Exam: Vital  Signs Blood pressure (!) 142/74, pulse 70, temperature 98.2 F (36.8 C), temperature source Oral, resp. rate 18, height 5\' 9"  (1.753 m), weight 74 kg, SpO2 95%.       General: awake, alert, appropriate, sitting up in bed; 100% tray eaten; NAD HENT: conjugate gaze; oropharynx moist CV: regular rate and rhythm; no  JVD Pulmonary: CTA B/L; no W/R/R- good air movement GI: soft, NT, ND, (+)BS Psychiatric: appropriate- slightly/less irritable Neurological: Ox3  MSK- R shoulder k-taped- less TTP Cannot get R arm into empty can test position- at 90 degrees- can get to ~ 70-80 degrees- but when try to test- Has Sx's c/w RTC tear AS WELL as biceps tendinitis- TTP anteriorly/posteriorly.    Skin: No evidence of breakdown, no evidence of rash   Neurologic: Cranial nerves II through XII intact, motor strength is 5/5 in bilateral deltoid, bicep, tricep, grip,3-/5 B  hip flexor, knee extensors,4-  ankle dorsiflexor and plantar flexor Sensory exam reduced LT below ankle on the right side and below supramalleolar area on left side  R>L hand intrinsic atrophy  Musculoskeletal: Full range of motion in all 4 extremities. No joint swelling   Assessment/Plan: 1. Functional deficits which require 3+ hours per day of interdisciplinary therapy in a comprehensive inpatient rehab setting. Physiatrist is providing close team supervision and 24 hour management of active medical problems listed below. Physiatrist and rehab team continue to assess barriers to discharge/monitor patient progress toward functional and medical goals  Care Tool:  Bathing    Body parts bathed by patient: Right arm, Left arm, Chest, Abdomen, Front perineal area, Face   Body parts bathed by helper: Right upper leg, Left upper leg, Right lower leg, Left lower leg, Buttocks     Bathing assist Assist Level: Maximal Assistance - Patient 24 - 49%     Upper Body Dressing/Undressing Upper body dressing        Upper body assist Assist Level: Minimal Assistance - Patient > 75%    Lower Body Dressing/Undressing Lower body dressing      What is the patient wearing?: Underwear/pull up, Pants     Lower body assist Assist for lower body dressing: Maximal Assistance - Patient 25 - 49%     Toileting Toileting    Toileting assist Assist for  toileting: Maximal Assistance - Patient 25 - 49%     Transfers Chair/bed transfer  Transfers assist     Chair/bed transfer assist level: Minimal Assistance - Patient > 75%     Locomotion Ambulation   Ambulation assist   Ambulation activity did not occur: Safety/medical concerns  Assist level: Minimal Assistance - Patient > 75% Assistive device: Walker-rolling Max distance: 61ft   Walk 10 feet activity   Assist  Walk 10 feet activity did not occur: Safety/medical concerns  Assist level: Minimal Assistance - Patient > 75% Assistive device: Walker-rolling   Walk 50 feet activity   Assist Walk 50 feet with 2 turns activity did not occur: Safety/medical concerns  Assist level: Minimal Assistance - Patient > 75% Assistive device: Walker-rolling    Walk 150 feet activity   Assist Walk 150 feet activity did not occur: Safety/medical concerns         Walk 10 feet on uneven surface  activity   Assist Walk 10 feet on uneven surfaces activity did not occur: Safety/medical concerns         Wheelchair     Assist Is the patient using a wheelchair?: Yes Type of Wheelchair: Manual  Wheelchair assist level: Dependent - Patient 0% Max wheelchair distance: 10 ft    Wheelchair 50 feet with 2 turns activity    Assist        Assist Level: Dependent - Patient 0%   Wheelchair 150 feet activity     Assist      Assist Level: Dependent - Patient 0%   Blood pressure (!) 142/74, pulse 70, temperature 98.2 F (36.8 C), temperature source Oral, resp. rate 18, height 5\' 9"  (1.753 m), weight 74 kg, SpO2 95%.  Medical Problem List and Plan: 1. Functional deficits secondary to lumbar stenosis with radiculopathy s/p  L2-L5 DLIF with instrumentation 10/29 with Dr. Maisie Fus             -patient may shower             -ELOS/Goals: 10-14 days, Mod I PT/OT D/c 11/21  Con't CIR PT and OT- agree with K taping-  2.  Antithrombotics: -DVT/anticoagulation:   Pharmaceutical: Heparin             -antiplatelet therapy: none   3. Pain Management: Tylenol, Robaxin, oxycodone as needed             -Continue gabapentin 100 mg twice daily Cont oxy IR 10mg  q 4 prn, if somnolent consider change to hydromorphone given renal status    11/11- not lethargic- so will maintain dosing for now  11/12- having more pain due to soreness from therapies- will maintain regimen- has Robaxin as needed for muscle spasms  11/13- got Xray of R shoulder- shows signs of RTC- but pt also having concern about fx?-xray results suggested a CT- but computer wanted me to get MRI- R shoulder MRI ordered- will add Voltaren gel 2g QID to try and help inflammation.   11/14- pain doing MUCH better- con't regimen- Ortho referral when discharged 4. Mood/Behavior/Sleep: LCSW to evaluate and provide emotional support             -antipsychotic agents: n/a   5. Neuropsych/cognition: This patient is capable of making decisions on his own behalf.   6. Skin/Wound Care: Routine skin care checks              - R toe sutures removed on admission, WOC order for betadyne BID   7. Fluids/Electrolytes/Nutrition: Strict Is and Os and follow-up chemistries   8: Hypertension: monitor TID and prn; appears stable on oral agents             -continue amlodipine 10 mg daily             -continue carvedilol 3.125 mg twice daily Vitals:   09/16/23 1957 09/17/23 0326  BP: (!) 149/90 (!) 142/74  Pulse: 74 70  Resp: 19 18  Temp: 97.8 F (36.6 C) 98.2 F (36.8 C)  SpO2: 100% 95%  Controlled 11/9  11/11- BP controlled in last 24 hours, but was real high yesterday early AM- however nothing like that since- not sure if was correct? Not clear   111/14- BP running in 140s systolic in last 24 hours- was controlled last few days prior- will monitor trend and see if needs to change meds 9: Diabetes mellitus: CBGs 4 times daily; A1c = 5.7%             -continue sliding scale insulin 0 to 15 units             -?  resume home Lantus 5 units daily   CBG (last 3)  Recent Labs  09/16/23 1641 09/16/23 2056 09/17/23 0617  GLUCAP 144* 80 97   Controlled 11/10  11/11- CBGs controlled- look good-c on't regimen  11/12- not on Home dose Lantus, but CBGs look great- will not rstart right now  11/14- Cbgs looking great off home Lantus 10: Urinary retention, acute, h/o BPH:              -had indwelling Foley catheter -continue Flomax 0.4 mg daily  -Foley placed 10/30; DC foley trial with ISC Q6H for PVR >350 ccs to start in AM 11/9   11/11- foley out-= last PVR 108cc- will monitor with bladder scans 11: AKI/CKD stage IV: Nearing need for hemodialysis             -Nephrology signed off, he has had LUE AV fistula created 11/7 (follow-up arranged with [facility])- pt will f/u with Dr Wolfgang Phoenix as OP              -On Calcitrol, PhosLo    11/11- looking slightly better this AM 12: 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 Hx IBS but no further diarrhea   11/12- LBM last night  11/14- changed Colace to prn- and miralax is prn- pt had 2 bowel accidents from meds- doesn't want anymore 16: Right 4th toe laceration PTA: well-healed, discontinue sutures.    17. ? R biceps tear - documented per record in 2022 by Dr. Hilda Lias at [facility]. Likely lost to follow up; would re-establish as OP.    18. Hx cervical stenosis s/p C4-T1 PCDF 2022, then C4/5 ACDF 11/2022 with Dr. Maisie Fus. Pain regimen as above.     19.  Mild hyperK+ due to CKD    11/11- K+ down to 5.0   I spent a total of 37   minutes on total care today- >50% coordination of care- due to  [facility] over MRI results independently- also went over  MRI with pt- and explained why having pain, most likely- Also made changes on bowel meds- added Flonase, and will send to Ortho when d/c;d   LOS: 6 days A FACE TO FACE EVALUATION WAS PERFORMED  Renn Stille 09/17/2023, 8:45 AM

## 2023-09-18 LAB — GLUCOSE, CAPILLARY
Glucose-Capillary: 83 mg/dL (ref 70–99)
Glucose-Capillary: 77 mg/dL (ref 70–99)
Glucose-Capillary: 118 mg/dL — ABNORMAL HIGH (ref 70–99)
Glucose-Capillary: 145 mg/dL — ABNORMAL HIGH (ref 70–99)

## 2023-09-18 NOTE — Progress Notes (Signed)
PROGRESS NOTE   Subjective/Complaints:  Pt reports voltaren gel helps, but not "a cure".   LBM- had small one yesterday- but last good BM 2 days ago.   Peeing great  ROS- -   Pt denies SOB, abd pain, CP, N/V/C/D, and vision changes R shoulder pain- better   Except for HPI Objective:   MR SHOULDER RIGHT WO CONTRAST  Result Date: 09/16/2023 CLINICAL DATA:  Acute on chronic right shoulder pain. EXAM: MRI OF THE RIGHT SHOULDER WITHOUT CONTRAST TECHNIQUE: Multiplanar, multisequence MR imaging of the shoulder was performed. No intravenous contrast was administered. COMPARISON:  Right shoulder x-rays from yesterday. FINDINGS: Rotator cuff: Full-thickness, full width tears of the supraspinatus and infraspinatus tendons with retraction to the glenoid. Full-thickness, partial width tear involving the superior subscapularis tendon. Intact teres minor tendon. Muscles: Diffuse supraspinatus, infraspinatus, and subscapularis muscle edema with mild fatty infiltration. Normal teres minor muscle. Biceps long head:  Completely torn and retracted into the upper arm. Acromioclavicular Joint: Mild arthropathy of the acromioclavicular joint. Small amount of fluid in the subacromial/subdeltoid bursa. Glenohumeral Joint: Small joint effusion. Scattered partial and full-thickness cartilage loss over the humeral head and glenoid. Labrum: Diffusely degenerated and torn, sparing the inferior labrum. Bones: High-riding humeral head. No acute fracture or dislocation. No suspicious bone lesion. Other: None. IMPRESSION: 1. Full-thickness, full width tears of the supraspinatus and infraspinatus tendons with retraction to the glenoid. Full-thickness, partial width tear involving the superior subscapularis tendon. Diffuse supraspinatus, infraspinatus, and subscapularis muscle edema with mild fatty infiltration. 2. Completely torn and retracted long head biceps tendon. 3.  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 Electronically Signed   By: Obie Dredge M.D.   On: 09/16/2023 16:12   No results for input(s): "WBC", "HGB", "HCT", "PLT" in the last 72 hours.  No results for input(s): "NA", "K", "CL", "CO2", "GLUCOSE", "BUN", "CREATININE", "CALCIUM" in the last 72 hours.   Intake/Output Summary (Last 24 hours) at 09/18/2023 0857 Last data filed at 09/18/2023 0846 Gross per 24 hour  Intake 70 ml  Output 800 ml  Net -730 ml        Physical Exam: Vital Signs Blood pressure (!) 141/70, pulse 71, temperature 98.7 F (37.1 C), temperature source Oral, resp. rate 18, height 5\' 9"  (1.753 m), weight 74 kg, SpO2 95%.        General: awake, alert, appropriate, sitting up in bed eating breakfast; NAD HENT: conjugate gaze; oropharynx moist CV: regular rate and rhythm; no JVD Pulmonary: CTA B/L; no W/R/R- good air movement GI: soft, NT, ND, (+)BS- slightly hypoactive Psychiatric: appropriate- less irritable Neurological: Ox3   MSK- R shoulder k-taped- less TTP Cannot get R arm into empty can test position- at 90 degrees- can get to ~ 70-80 degrees- but when try to test- Has Sx's c/w RTC tear AS WELL as biceps tendinitis- TTP anteriorly/posteriorly.    Skin: No evidence of breakdown, no evidence of rash   Neurologic: Cranial nerves II through XII intact, motor strength is 5/5 in bilateral deltoid, bicep, tricep, grip,3-/5 B  hip flexor, knee extensors,4-  ankle dorsiflexor and plantar flexor Sensory exam reduced LT below ankle on the right side and below supramalleolar  area on left side  R>L hand intrinsic atrophy  Musculoskeletal: Full range of motion in all 4 extremities. No joint swelling   Assessment/Plan: 1. Functional deficits which require 3+ hours per day of interdisciplinary therapy in a comprehensive inpatient rehab setting. Physiatrist is providing close team supervision and 24 hour management of active medical  problems listed below. Physiatrist and rehab team continue to assess barriers to discharge/monitor patient progress toward functional and medical goals  Care Tool:  Bathing    Body parts bathed by patient: Right arm, Left arm, Chest, Abdomen, Front perineal area, Face   Body parts bathed by helper: Right upper leg, Left upper leg, Right lower leg, Left lower leg, Buttocks     Bathing assist Assist Level: Maximal Assistance - Patient 24 - 49%     Upper Body Dressing/Undressing Upper body dressing        Upper body assist Assist Level: Minimal Assistance - Patient > 75%    Lower Body Dressing/Undressing Lower body dressing      What is the patient wearing?: Underwear/pull up, Pants     Lower body assist Assist for lower body dressing: Maximal Assistance - Patient 25 - 49%     Toileting Toileting    Toileting assist Assist for toileting: Maximal Assistance - Patient 25 - 49%     Transfers Chair/bed transfer  Transfers assist     Chair/bed transfer assist level: Minimal Assistance - Patient > 75%     Locomotion Ambulation   Ambulation assist   Ambulation activity did not occur: Safety/medical concerns  Assist level: Minimal Assistance - Patient > 75% Assistive device: Walker-rolling Max distance: 18ft   Walk 10 feet activity   Assist  Walk 10 feet activity did not occur: Safety/medical concerns  Assist level: Minimal Assistance - Patient > 75% Assistive device: Walker-rolling   Walk 50 feet activity   Assist Walk 50 feet with 2 turns activity did not occur: Safety/medical concerns  Assist level: Minimal Assistance - Patient > 75% Assistive device: Walker-rolling    Walk 150 feet activity   Assist Walk 150 feet activity did not occur: Safety/medical concerns         Walk 10 feet on uneven surface  activity   Assist Walk 10 feet on uneven surfaces activity did not occur: Safety/medical concerns         Wheelchair     Assist  Is the patient using a wheelchair?: Yes Type of Wheelchair: Manual    Wheelchair assist level: Dependent - Patient 0% Max wheelchair distance: 10 ft    Wheelchair 50 feet with 2 turns activity    Assist        Assist Level: Dependent - Patient 0%   Wheelchair 150 feet activity     Assist      Assist Level: Dependent - Patient 0%   Blood pressure (!) 141/70, pulse 71, temperature 98.7 F (37.1 C), temperature source Oral, resp. rate 18, height 5\' 9"  (1.753 m), weight 74 kg, SpO2 95%.  Medical Problem List and Plan: 1. Functional deficits secondary to lumbar stenosis with radiculopathy s/p  L2-L5 DLIF with instrumentation 10/29 with Dr. Maisie Fus             -patient may shower             -ELOS/Goals: 10-14 days, Mod I PT/OT D/c 11/21  Con't CIR PT and OT Con't K taping of R shoulder 2.  Antithrombotics: -DVT/anticoagulation:  Pharmaceutical: Heparin             -  antiplatelet therapy: none   3. Pain Management: Tylenol, Robaxin, oxycodone as needed             -Continue gabapentin 100 mg twice daily Cont oxy IR 10mg  q 4 prn, if somnolent consider change to hydromorphone given renal status    11/11- not lethargic- so will maintain dosing for now  11/12- having more pain due to soreness from therapies- will maintain regimen- has Robaxin as needed for muscle spasms  11/13- got Xray of R shoulder- shows signs of RTC- but pt also having concern about fx?-xray results suggested a CT- but computer wanted me to get MRI- R shoulder MRI ordered- will add Voltaren gel 2g QID to try and help inflammation.   11/14- pain doing MUCH better- con't regimen- Ortho referral when discharged  11/15- pain much better- now intermittent in R shoulder- still having leg pain- sounds like from therapy- sore 4. Mood/Behavior/Sleep: LCSW to evaluate and provide emotional support             -antipsychotic agents: n/a   5. Neuropsych/cognition: This patient is capable of making decisions on his  own behalf.   6. Skin/Wound Care: Routine skin care checks              - R toe sutures removed on admission, WOC order for betadyne BID   7. Fluids/Electrolytes/Nutrition: Strict Is and Os and follow-up chemistries   8: Hypertension: monitor TID and prn; appears stable on oral agents             -continue amlodipine 10 mg daily             -continue carvedilol 3.125 mg twice daily Vitals:   09/17/23 1942 09/18/23 0511  BP: 137/75 (!) 141/70  Pulse: 65 71  Resp: 18 18  Temp: 98.6 F (37 C) 98.7 F (37.1 C)  SpO2: 97% 95%  Controlled 11/9  11/11- BP controlled in last 24 hours, but was real high yesterday early AM- however nothing like that since- not sure if was correct? Not clear   11/15- BP running 130s-140s systolic- will con't regimen 9: Diabetes mellitus: CBGs 4 times daily; A1c = 5.7%             -continue sliding scale insulin 0 to 15 units             -? resume home Lantus 5 units daily   CBG (last 3)  Recent Labs    09/17/23 1648 09/17/23 2054 09/18/23 0623  GLUCAP 127* 98 83   Controlled 11/10  11/11- CBGs controlled- look good-c on't regimen  11/12- not on Home dose Lantus, but CBGs look great- will not rstart right now  11/14-11/15 Cbgs looking great off home Lantus 10: Urinary retention, acute, h/o BPH:              -had indwelling Foley catheter -continue Flomax 0.4 mg daily  -Foley placed 10/30; DC foley trial with ISC Q6H for PVR >350 ccs to start in AM 11/9   11/11- foley out-= last PVR 108cc- will monitor with bladder scans  11/15- peeing well- scans look OK- running 0-150s- on Flomax- 0.4 mg daily 11: AKI/CKD stage IV: Nearing need for hemodialysis             -Nephrology signed off, he has had LUE AV fistula created 11/7 (follow-up arranged with [facility])- pt will f/u with Dr Wolfgang Phoenix as OP              -On  Calcitrol, PhosLo    11/11- looking slightly better this AM 12: 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 Hx IBS but no further diarrhea   11/12- LBM last night  11/14- changed Colace to prn- and miralax is prn- pt had 2 bowel accidents from meds- doesn't want anymore  11/15- small BM yesterday- otherwise 2 days ago 16: Right 4th toe laceration PTA: well-healed, discontinue sutures.    17. ? R biceps tear - documented per record in 2022 by Dr. Hilda Lias at [place]. Likely lost to follow up; would re-establish as OP.   11/15- will send referral to [facility] at d/c- since has severe multiple muscle RTC tear of R shoulder-    18. Hx cervical stenosis s/p C4-T1 PCDF 2022, then C4/5 ACDF 11/2022 with Dr. Maisie Fus. Pain regimen as above.     19.  Mild hyperK+ due to CKD    11/11- K+ down to 5.0   LOS: 7 days A FACE TO FACE EVALUATION WAS PERFORMED  Jhordyn Hoopingarner 09/18/2023, 8:57 AM

## 2023-09-18 NOTE — Progress Notes (Signed)
Physical Therapy Session Note  Patient Details  Name: Curtis Hartman MRN: [id number] Date of Birth: [birth year]  Today's Date: 09/18/2023 PT Individual Time: 1410-1505 PT Individual Time Calculation (min): 55 min   Short Term Goals: Week 1:  PT Short Term Goal 1 (Week 1): Pt will perform bed mobility with overall CGA/ minA using bed features. PT Short Term Goal 2 (Week 1): Pt will perform sit<>stand transfers with overall CGA. PT Short Term Goal 3 (Week 1): Pt will perform stand pivot transfers with overall MinA. PT Short Term Goal 4 (Week 1): Pt will initiate gait training. PT Short Term Goal 5 (Week 1): Pt will initiate w/c mobility training.  Skilled Therapeutic Interventions/Progress Updates: Patient supine in bed on entrance to room. Patient alert and agreeable to PT session.   Patient had on C-collar on entrance with pt reporting that it is prn (could not find anything in orders stating it to be a mandatory precaution when OOB - reached out to attending MD for clarification). Pt reported "15/10" pain in low back at beginning of PT session (premedicated) but willing to try to do therapy to tolerance.  Therapeutic Activity: Bed Mobility: Pt performed supine<>sit on EOB with supervision and VC to maintain spinal precautions (avoid twisting when trying to log roll).  Transfers: Pt performed sit<>stand transfers throughout session with supervision and VC to reach back to WC arm rest to control descent.   - Pt reported that WC propulsion has been going alright, except for R UE having pain after short distances due to previous chronic injuries. Pt agreeable to have PTA modify B grips on wheels to make it easier to propel.  - Pt transferred from [facility] to [employer] with close supervision to try to have a BM at end of session.   Wheelchair Mobility:  Pt propelled wheelchair from [employer] (roughly 250') with decrease in speed due to R shoulder discomfort. PTA applied blue theraband around B grips  on wheels with pt reporting that it made it easier to propel as it decreased strain on R shoulder. Pt propelled WC from main gym<day room and around nsg/day room loop and to main gym with supervision. Pt required several short rest breaks from main gym to day room and back to main gym due to fatigue. Pt transported rest of the way from main gym to ortho gym to navigate ramp in [facility] which required minA to propel over ramp lip, and light minA to ascend with CGA to descend for safety with VC for pt to control descending. Pt transported back to gym in [facility] dependently due to fatigue.   Patient sitting on [facility] with NT outside door and all needs within reach.      Therapy Documentation Precautions:  Precautions Precautions: Back, Fall Precaution Comments: decreased sensation in RLE Required Braces or Orthoses: Spinal Brace Spinal Brace: Lumbar corset, Applied in sitting position Restrictions Weight Bearing Restrictions: No Other Position/Activity Restrictions: Pt required assist to don brace.  Therapy/Group: Individual Therapy  Jullie Arps PTA 09/18/2023, 3:38 PM

## 2023-09-18 NOTE — Progress Notes (Signed)
Physical Therapy Session Note  Patient Details  Name: Curtis Hartman MRN: [id number] Date of Birth: August 15, 1961  Today's Date: 09/18/2023 PT Individual Time: 0805-0905 PT Individual Time Calculation (min): 60 min   Short Term Goals: Week 1:  PT Short Term Goal 1 (Week 1): Pt will perform bed mobility with overall CGA/ minA using bed features. PT Short Term Goal 2 (Week 1): Pt will perform sit<>stand transfers with overall CGA. PT Short Term Goal 3 (Week 1): Pt will perform stand pivot transfers with overall MinA. PT Short Term Goal 4 (Week 1): Pt will initiate gait training. PT Short Term Goal 5 (Week 1): Pt will initiate w/c mobility training.  Skilled Therapeutic Interventions/Progress Updates: Pt presented in bed agreeable to therapy. Pt c/o unrated pain low back, premedicated with rest and repositioning provided as needed. PTA stabilized pt's feet to allow pt to scoot towards HOB to more easily manage bed mobility (pt was bear bottom of bed). Pt then completed supine to sit with use of bed features and supervision. Donned pants EOB with use of reacher and set up assist. Pt donned shirt mod I and PTA assisted donning LSO. Pt stood from slightly elevated bed with CGA and performed ambulatory transfer to w/c with CGA. At sink pt performed oral hygiene mod I. Pt propelled to main gym with supervision for general conditioning. Participated in Sit to stand 2 x 5 from elevated height then lowered height for BLE strengthening. Pt instructed in not bracing BLE against mat for increased ms recruitment. Pt then ambulated 118ft x 2 with seated rest breaks between bouts. Pt cued to maintain close proximity to RW and improved erect posture. Pt returned to w/c and propelled back to room in same manner as prior. Pt completed ambulatory transfer to bed per pt request. Performed sit to supine to flat bed with supervision. Pt left in bed at end of session with bed alarm on, call bell within reach and needs met.       Therapy Documentation Precautions:  Precautions Precautions: Back, Fall Precaution Comments: decreased sensation in RLE Required Braces or Orthoses: Spinal Brace Spinal Brace: Lumbar corset, Applied in sitting position Restrictions Weight Bearing Restrictions: No Other Position/Activity Restrictions: Pt required assist to don brace. General:   Vital Signs: Therapy Vitals Temp: 98.7 F (37.1 C) Temp Source: Oral Pulse Rate: 71 Resp: 18 BP: (!) 141/70 Patient Position (if appropriate): Lying Oxygen Therapy SpO2: 95 % Pain: Pain Assessment Pain Scale: 0-10 Pain Score: 10-Worst pain ever Pain Type: Surgical pain;Acute pain Pain Location: Back Pain Orientation: Mid;Lower Pain Descriptors / Indicators: Aching;Constant Pain Frequency: Constant Pain Onset: On-going Pain Intervention(s): Medication (See eMAR)  Therapy/Group: Individual Therapy  Baelyn Doring 09/18/2023, 9:11 AM

## 2023-09-18 NOTE — Progress Notes (Signed)
Patient ID: Curtis Hartman, male   DOB: Jan 15, 1961, 62 y.o.   MRN: [id number]  Message left for [facility] to make OPPT referral. Pt has been here before after neck surgery. Will await return call and will get fax number to send the referral.

## 2023-09-18 NOTE — Progress Notes (Signed)
Occupational Therapy Session Note  Patient Details  Name: Curtis Hartman MRN: [id number] Date of Birth: 1961/08/06  Today's Date: 09/18/2023 OT Individual Time: 4098-1191 OT Individual Time Calculation (min): 43 min     Skilled Therapeutic Interventions/Progress Updates: Patient received resting in bed with report of severe pain. Patient educated on thermo modalities and use of ice to reduce pain between doses of pain medication. Patient agreeable to trying ice and participating with OT as able. Patient reports feeling good with his current medication, but pain got away from him this morning. Ice applied to center of back with pillow to the side of the ice pack to prevent patient from lying on uneven surface. Tolerated the ice for 15 minutes with patient report of decreased discomfort with the use of modalities. Patient informed that anyone can assist in refilling the ice pack left in his room if he asks. Patient tolerated supine stabilization exercies working on UE reach from midline with core activation. Patient with recent port placed in L arm. Educated on post operative grip exercises for port placement. Patient aware of restriction placed on LUE while the site continues to heal. Patient with good participation and motivation to get the most out of his recovery process. Continue with skilled OT POC to progress towards functional independence.     Therapy Documentation Precautions:  Precautions Precautions: Back, Fall Precaution Comments: decreased sensation in RLE Required Braces or Orthoses: Spinal Brace Spinal Brace: Lumbar corset, Applied in sitting position Restrictions Weight Bearing Restrictions: No Other Position/Activity Restrictions: Pt required assist to don brace. General:   Vital Signs:   Pain: Pain Assessment Pain Scale: 0-10 Pain Score: 10-Worst pain ever Pain Type: Surgical pain;Acute pain Pain Location: Back Pain Orientation: Mid;Lower Pain Descriptors /  Indicators: Aching;Constant Pain Frequency: Constant Pain Onset: On-going Pain Intervention(s): ice applied, still has a couple hours before next dose of medication.  ADL: ADL Equipment Provided: Reacher, Sock aid, Long-handled shoe horn, Long-handled sponge Eating: Set up Where Assessed-Eating: Edge of bed Grooming: Minimal assistance Where Assessed-Grooming: Edge of bed Upper Body Bathing: Minimal assistance Where Assessed-Upper Body Bathing: Edge of bed Lower Body Bathing: Maximal assistance Where Assessed-Lower Body Bathing: Edge of bed Upper Body Dressing: Minimal assistance Where Assessed-Upper Body Dressing: Edge of bed Lower Body Dressing: Maximal assistance Where Assessed-Lower Body Dressing: Edge of bed Toileting: Moderate assistance Where Assessed-Toileting: Bedside Commode Toilet Transfer: Moderate assistance Toilet Transfer Method: Stand pivot Toilet Transfer Equipment: Gaffer Method: Unable to assess Film/video editor Method: Warden/ranger: Emergency planning/management officer, Grab bars ADL Comments: max A LB, min A UB, min-mod A rollator transfers, max A TLSO donning    Therapy/Group: Individual Therapy  Curtis Hartman 09/18/2023, 12:51 PM

## 2023-09-18 NOTE — Plan of Care (Signed)
  Problem: Consults Goal: RH SPINAL CORD INJURY PATIENT EDUCATION Description:  See Patient Education module for education specifics.  Outcome: Progressing   Problem: RH SKIN INTEGRITY Goal: RH STG SKIN FREE OF INFECTION/BREAKDOWN Description: Incision will remain intact and free of infection/breakdown with min assist  Outcome: Progressing Goal: RH STG MAINTAIN SKIN INTEGRITY WITH ASSISTANCE Description: STG Maintain Skin Integrity With cueing Assistance. Outcome: Progressing Goal: RH STG ABLE TO PERFORM INCISION/WOUND CARE W/ASSISTANCE Description: STG Able To Perform Incision/Wound Care With min Assistance. Outcome: Progressing   Problem: RH SAFETY Goal: RH STG ADHERE TO SAFETY PRECAUTIONS W/ASSISTANCE/DEVICE Description: STG Adhere to Safety Precautions With cueing Assistance/Device. Outcome: Progressing Goal: RH STG DECREASED RISK OF FALL WITH ASSISTANCE Description: STG Decreased Risk of Fall With min Assistance. Outcome: Progressing   Problem: RH PAIN MANAGEMENT Goal: RH STG PAIN MANAGED AT OR BELOW PT'S PAIN GOAL Description: Less than 4 with PRN medications min assist  Outcome: Progressing   Problem: RH KNOWLEDGE DEFICIT SCI Goal: RH STG INCREASE KNOWLEDGE OF SELF CARE AFTER SCI Description: Patient/caregiver will be able to managed medications, adhere to back precautions, manage bladder and bowel activities, and diet/life style modifications to improve health from nursing education and nursing handouts independently  Outcome: Progressing

## 2023-09-18 NOTE — Progress Notes (Signed)
Occupational Therapy Session Note  Patient Details  Name: CARDER BELOW MRN: [id number] Date of Birth: 08-Jun-1961  Today's Date: 09/18/2023 OT Individual Time: 1300-1345 OT Individual Time Calculation (min): 45 min    Short Term Goals: Week 1:  OT Short Term Goal 1 (Week 1): Pt will amb to bathroom with rollator with CGA for toilet and shower bench access OT Short Term Goal 2 (Week 1): Pt will state 3/3 spinal precuations with no cues OT Short Term Goal 3 (Week 1): Pt will utilize AE to perform simple LB dressing with CGA  Skilled Therapeutic Interventions/Progress Updates:    PT sitting on BSC upon arrival. Pt unsuccessful with BM. Mod A for toileting tasks. Stand pivot transfer to EOB with min A. Pt transferred to w/c with min A. Pt transitioned to vending machine to make purchases. Standing at vending machine with min a. Pt transitioned to ortho gym for light UE activity on SciFit-level 1 2x2 mins. Pt returned to room and transferred to EOB. Sit>supine with supervision. Pt remained in bed with all needs within reach. Bed alarm activated.   Therapy Documentation Precautions:  Precautions Precautions: Back, Fall Precaution Comments: decreased sensation in RLE Required Braces or Orthoses: Spinal Brace Spinal Brace: Lumbar corset, Applied in sitting position Restrictions Weight Bearing Restrictions: No Other Position/Activity Restrictions: Pt required assist to don brace.    Pain: Pt c/o 9/10 back pain with activity; repositioned and rest as appropriate with relief  Therapy/Group: Individual Therapy  Rich Brave 09/18/2023, 2:05 PM

## 2023-09-19 DIAGNOSIS — K5901 Slow transit constipation: Secondary | ICD-10-CM

## 2023-09-19 DIAGNOSIS — S46001D Unspecified injury of muscle(s) and tendon(s) of the rotator cuff of right shoulder, subsequent encounter: Secondary | ICD-10-CM

## 2023-09-19 LAB — GLUCOSE, CAPILLARY
Glucose-Capillary: 107 mg/dL — ABNORMAL HIGH (ref 70–99)
Glucose-Capillary: 86 mg/dL (ref 70–99)
Glucose-Capillary: 127 mg/dL — ABNORMAL HIGH (ref 70–99)
Glucose-Capillary: 129 mg/dL — ABNORMAL HIGH (ref 70–99)

## 2023-09-19 NOTE — Plan of Care (Signed)
  Problem: Consults Goal: RH SPINAL CORD INJURY PATIENT EDUCATION Description:  See Patient Education module for education specifics.  Outcome: Progressing   Problem: RH SKIN INTEGRITY Goal: RH STG SKIN FREE OF INFECTION/BREAKDOWN Description: Incision will remain intact and free of infection/breakdown with min assist  Outcome: Progressing Goal: RH STG MAINTAIN SKIN INTEGRITY WITH ASSISTANCE Description: STG Maintain Skin Integrity With cueing Assistance. Outcome: Progressing Goal: RH STG ABLE TO PERFORM INCISION/WOUND CARE W/ASSISTANCE Description: STG Able To Perform Incision/Wound Care With min Assistance. Outcome: Progressing   Problem: RH SAFETY Goal: RH STG ADHERE TO SAFETY PRECAUTIONS W/ASSISTANCE/DEVICE Description: STG Adhere to Safety Precautions With cueing Assistance/Device. Outcome: Progressing Goal: RH STG DECREASED RISK OF FALL WITH ASSISTANCE Description: STG Decreased Risk of Fall With min Assistance. Outcome: Progressing   Problem: RH PAIN MANAGEMENT Goal: RH STG PAIN MANAGED AT OR BELOW PT'S PAIN GOAL Description: Less than 4 with PRN medications min assist  Outcome: Progressing   Problem: RH KNOWLEDGE DEFICIT SCI Goal: RH STG INCREASE KNOWLEDGE OF SELF CARE AFTER SCI Description: Patient/caregiver will be able to managed medications, adhere to back precautions, manage bladder and bowel activities, and diet/life style modifications to improve health from nursing education and nursing handouts independently  Outcome: Progressing

## 2023-09-19 NOTE — Progress Notes (Signed)
PROGRESS NOTE   Subjective/Complaints:  Pt reports back pain when he's up walking. Otherwise denied any problems this morning  ROS: Patient denies fever, rash, sore throat, blurred vision, dizziness, nausea, vomiting, diarrhea, cough, shortness of breath or chest pain,   headache, or mood change..   Objective:   No results found. No results for input(s): "WBC", "HGB", "HCT", "PLT" in the last 72 hours.  No results for input(s): "NA", "K", "CL", "CO2", "GLUCOSE", "BUN", "CREATININE", "CALCIUM" in the last 72 hours.   Intake/Output Summary (Last 24 hours) at 09/19/2023 0835 Last data filed at 09/19/2023 0825 Gross per 24 hour  Intake 1018 ml  Output 600 ml  Net 418 ml        Physical Exam: Vital Signs Blood pressure 133/74, pulse 77, temperature 97.7 F (36.5 C), temperature source Oral, resp. rate 18, height 5\' 9"  (1.753 m), weight 74 kg, SpO2 97%.  Constitutional: No distress . Vital signs reviewed. HEENT: NCAT, EOMI, oral membranes moist Neck: supple Cardiovascular: RRR without murmur. No JVD    Respiratory/Chest: CTA Bilaterally without wheezes or rales. Normal effort    GI/Abdomen: BS +, non-tender, non-distended Ext: no clubbing, cyanosis, or edema Psych: flat but cooperative  Neurological: Ox3   MSK- R shoulder pain with ER/IR  Skin: No evidence of breakdown, no evidence of rash   Neurologic: Cranial nerves II through XII intact, motor strength is 5/5 in bilateral deltoid, bicep, tricep, grip,3-/5 B  hip flexor, knee extensors,4-  ankle dorsiflexor and plantar flexor Sensory exam reduced LT below ankle on the right side and below supramalleolar area on left side  R>L hand intrinsic atrophy --no changes 11/16 Musculoskeletal: Full range of motion in all 4 extremities. No joint swelling   Assessment/Plan: 1. Functional deficits which require 3+ hours per day of interdisciplinary therapy in a  comprehensive inpatient rehab setting. Physiatrist is providing close team supervision and 24 hour management of active medical problems listed below. Physiatrist and rehab team continue to assess barriers to discharge/monitor patient progress toward functional and medical goals  Care Tool:  Bathing    Body parts bathed by patient: Right arm, Left arm, Chest, Abdomen, Front perineal area, Face   Body parts bathed by helper: Right upper leg, Left upper leg, Right lower leg, Left lower leg, Buttocks     Bathing assist Assist Level: Maximal Assistance - Patient 24 - 49%     Upper Body Dressing/Undressing Upper body dressing        Upper body assist Assist Level: Minimal Assistance - Patient > 75%    Lower Body Dressing/Undressing Lower body dressing      What is the patient wearing?: Underwear/pull up, Pants     Lower body assist Assist for lower body dressing: Maximal Assistance - Patient 25 - 49%     Toileting Toileting    Toileting assist Assist for toileting: Maximal Assistance - Patient 25 - 49%     Transfers Chair/bed transfer  Transfers assist     Chair/bed transfer assist level: Minimal Assistance - Patient > 75%     Locomotion Ambulation   Ambulation assist   Ambulation activity did not occur: Safety/medical concerns  Assist level:  Minimal Assistance - Patient > 75% Assistive device: Walker-rolling Max distance: 68ft   Walk 10 feet activity   Assist  Walk 10 feet activity did not occur: Safety/medical concerns  Assist level: Minimal Assistance - Patient > 75% Assistive device: Walker-rolling   Walk 50 feet activity   Assist Walk 50 feet with 2 turns activity did not occur: Safety/medical concerns  Assist level: Minimal Assistance - Patient > 75% Assistive device: Walker-rolling    Walk 150 feet activity   Assist Walk 150 feet activity did not occur: Safety/medical concerns         Walk 10 feet on uneven surface   activity   Assist Walk 10 feet on uneven surfaces activity did not occur: Safety/medical concerns         Wheelchair     Assist Is the patient using a wheelchair?: Yes Type of Wheelchair: Manual    Wheelchair assist level: Dependent - Patient 0% Max wheelchair distance: 10 ft    Wheelchair 50 feet with 2 turns activity    Assist        Assist Level: Dependent - Patient 0%   Wheelchair 150 feet activity     Assist      Assist Level: Dependent - Patient 0%   Blood pressure 133/74, pulse 77, temperature 97.7 F (36.5 C), temperature source Oral, resp. rate 18, height 5\' 9"  (1.753 m), weight 74 kg, SpO2 97%.  Medical Problem List and Plan: 1. Functional deficits secondary to lumbar stenosis with radiculopathy s/p  L2-L5 DLIF with instrumentation 10/29 with Dr. Maisie Fus             -patient may shower             -ELOS/Goals: 10-14 days, Mod I PT/OT D/c 11/21  -Continue CIR therapies including PT, OT  Con't K taping of R shoulder 2.  Antithrombotics: -DVT/anticoagulation:  Pharmaceutical: Heparin             -antiplatelet therapy: none   3. Pain Management: Tylenol, Robaxin, oxycodone as needed             -Continue gabapentin 100 mg twice daily Cont oxy IR 10mg  q 4 prn, if somnolent consider change to hydromorphone given renal status    11/11- not lethargic- so will maintain dosing for now  11/12- having more pain due to soreness from therapies- will maintain regimen- has Robaxin as needed for muscle spasms  11/13- got Xray of R shoulder- shows signs of RTC- but pt also having concern about fx?-xray results suggested a CT- but computer wanted me to get MRI- R shoulder MRI ordered- will add Voltaren gel 2g QID to try and help inflammation.   11/14- pain doing MUCH better- con't regimen- Ortho referral when discharged  11/15- pain much better- now intermittent in R shoulder- still having leg pain- sounds like from therapy  11/16 pt reports low back pain but  hesitate to change since current regimen has been helping--obsv today 4. Mood/Behavior/Sleep: LCSW to evaluate and provide emotional support             -antipsychotic agents: n/a   5. Neuropsych/cognition: This patient is capable of making decisions on his own behalf.   6. Skin/Wound Care: Routine skin care checks              - R toe sutures removed on admission, WOC order for betadyne BID   7. Fluids/Electrolytes/Nutrition: Strict Is and Os and follow-up chemistries   8: Hypertension: monitor TID  and prn; appears stable on oral agents             -continue amlodipine 10 mg daily             -continue carvedilol 3.125 mg twice daily Vitals:   09/18/23 1950 09/19/23 0309  BP: 138/77 133/74  Pulse: 71 77  Resp: 18 18  Temp: 97.7 F (36.5 C) 97.7 F (36.5 C)  SpO2: 95% 97%  Controlled 11/9  11/11- BP controlled in last 24 hours, but was real high yesterday early AM- however nothing like that since- not sure if was correct? Not clear   11/16 bp controlled--no changes 9: Diabetes mellitus: CBGs 4 times daily; A1c = 5.7%             -continue sliding scale insulin 0 to 15 units             -? resume home Lantus 5 units daily   CBG (last 3)  Recent Labs    09/18/23 1631 09/18/23 2117 09/19/23 0550  GLUCAP 118* 145* 107*   Controlled 11/10  11/11- CBGs controlled- look good-c on't regimen  11/12- not on Home dose Lantus, but CBGs look great- will not rstart right now  11/6  controlled no changes 10: Urinary retention, acute, h/o BPH:              -had indwelling Foley catheter -continue Flomax 0.4 mg daily  -Foley placed 10/30; DC foley trial with ISC Q6H for PVR >350 ccs to start in AM 11/9   11/11- foley out-= last PVR 108cc- will monitor with bladder scans  11/15- peeing well- scans look OK- running 0-150s- on Flomax- 0.4 mg daily 11: AKI/CKD stage IV: Nearing need for hemodialysis             -Nephrology signed off, he has had LUE AV fistula created 11/7 (follow-up  arranged with [facility])- pt will f/u with Dr Wolfgang Phoenix as OP              -On Calcitrol, PhosLo    11/11- looking slightly better this AM 12: 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 Hx IBS but no further diarrhea   11/12- LBM last night  11/14- changed Colace to prn- and miralax is prn- pt had 2 bowel accidents from meds- doesn't want anymore  11/15- small BM yesterday- otherwise 2 days ago 16: Right 4th toe laceration PTA: well-healed, discontinue sutures.    17. ? R biceps tear - documented per record in 2022 by Dr. Hilda Lias at [place]. Likely lost to follow up; would re-establish as OP.   11/15- will send referral to [facility] at d/c- since has severe multiple muscle RTC tear of R shoulder-    18. Hx cervical stenosis s/p C4-T1 PCDF 2022, then C4/5 ACDF 11/2022 with Dr. Maisie Fus. Pain regimen as above.     19.  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

## 2023-09-20 LAB — GLUCOSE, CAPILLARY
Glucose-Capillary: 77 mg/dL (ref 70–99)
Glucose-Capillary: 81 mg/dL (ref 70–99)
Glucose-Capillary: 123 mg/dL — ABNORMAL HIGH (ref 70–99)
Glucose-Capillary: 103 mg/dL — ABNORMAL HIGH (ref 70–99)

## 2023-09-20 NOTE — Progress Notes (Signed)
Occupational Therapy Weekly Progress Note  Patient Details  Name: Curtis Hartman MRN: [id number] Date of Birth: 08-Nov-1960  Beginning of progress report period: September 12, 2023 End of progress report period: September 20, 2023  Today's Date: 09/20/2023 OT Individual Time: 6433-2951 OT Individual Time Calculation (min): 60 min    Patient has met 3 of 3 short term goals.  Pt has demonstrated increased independence with ADLs such as functional transfers, increased carryover of 3/3 spinal precautions, ability to don/doff TLSO, and UB/LB dressing with AE as necessary. OT to continue education on pain management d/t reported increased pain throughout therapy sessions.  Patient continues to demonstrate the following deficits: muscle weakness, decreased cardiorespiratoy endurance, and decreased standing balance, decreased postural control, and decreased balance strategies and therefore will continue to benefit from skilled OT intervention to enhance overall performance with BADL and Reduce care partner burden.  Patient progressing toward long term goals..  Continue plan of care.  OT Short Term Goals Week 2:  OT Short Term Goal 1 (Week 2): STG=LTG d/t ELOS  Skilled Therapeutic Interventions/Progress Updates:      Therapy Documentation Precautions:  Precautions Precautions: Back, Fall Precaution Comments: decreased sensation in RLE Required Braces or Orthoses: Spinal Brace Spinal Brace: Lumbar corset, Applied in sitting position Restrictions Weight Bearing Restrictions: No Other Position/Activity Restrictions: Pt required assist to don brace. General: "I would love a shower" Pt supine in bed upon OT arrival, agreeable to OT session. OT removed KT tape on Rt shoulder  Pain:  8/10 pain reported in legs, activity, intermittent rest breaks, distractions provided for pain management, pt reports tolerable to proceed.    ADL: Bed mobility: SBA with increased time from flat bed using bed  rails Grooming: SBA seated at sink for combing hair and washing face and shaving Oral hygiene: SBA seated at sink, able to manipulate containers UB dressing: SBA for donning overhead shirt LB dressing: SBA with AE donning/doffing pants/brief, seated to thread over feet, standing at RW to manage over waist Footwear: SBA donning/doffing grip socks, removing with reacher, donning with sock aide Shower transfer: SBA with RW, VC for posture, ambulating from bed ~15 ft Bathing: SBA seated on TTB with long handled sponge Transfers: SBA with RW for ambulation around room, VC for safe RW management   Pt seated in W/C at end of session with W/C alarm donned, call light within reach and 4Ps assessed.    Therapy/Group: Individual Therapy  Velia Meyer, OTD, OTR/L 09/20/2023, 9:06 AM

## 2023-09-20 NOTE — Plan of Care (Signed)
  Problem: RH SKIN INTEGRITY Goal: RH STG MAINTAIN SKIN INTEGRITY WITH ASSISTANCE Description: STG Maintain Skin Integrity With cueing Assistance. Outcome: Progressing   Problem: RH SAFETY Goal: RH STG DECREASED RISK OF FALL WITH ASSISTANCE Description: STG Decreased Risk of Fall With min Assistance. Outcome: Progressing   Problem: RH PAIN MANAGEMENT Goal: RH STG PAIN MANAGED AT OR BELOW PT'S PAIN GOAL Description: Less than 4 with PRN medications min assist  Outcome: Progressing

## 2023-09-20 NOTE — Plan of Care (Signed)
  Problem: Consults Goal: RH SPINAL CORD INJURY PATIENT EDUCATION Description:  See Patient Education module for education specifics.  Outcome: Progressing   Problem: RH SKIN INTEGRITY Goal: RH STG SKIN FREE OF INFECTION/BREAKDOWN Description: Incision will remain intact and free of infection/breakdown with min assist  Outcome: Progressing Goal: RH STG MAINTAIN SKIN INTEGRITY WITH ASSISTANCE Description: STG Maintain Skin Integrity With cueing Assistance. Outcome: Progressing Goal: RH STG ABLE TO PERFORM INCISION/WOUND CARE W/ASSISTANCE Description: STG Able To Perform Incision/Wound Care With min Assistance. Outcome: Progressing   Problem: RH SAFETY Goal: RH STG ADHERE TO SAFETY PRECAUTIONS W/ASSISTANCE/DEVICE Description: STG Adhere to Safety Precautions With cueing Assistance/Device. Outcome: Progressing Goal: RH STG DECREASED RISK OF FALL WITH ASSISTANCE Description: STG Decreased Risk of Fall With min Assistance. Outcome: Progressing   Problem: RH PAIN MANAGEMENT Goal: RH STG PAIN MANAGED AT OR BELOW PT'S PAIN GOAL Description: Less than 4 with PRN medications min assist  Outcome: Progressing   Problem: RH KNOWLEDGE DEFICIT SCI Goal: RH STG INCREASE KNOWLEDGE OF SELF CARE AFTER SCI Description: Patient/caregiver will be able to managed medications, adhere to back precautions, manage bladder and bowel activities, and diet/life style modifications to improve health from nursing education and nursing handouts independently  Outcome: Progressing

## 2023-09-20 NOTE — Progress Notes (Signed)
PROGRESS NOTE   Subjective/Complaints:  Pt irritable. Says back bothers him when/after he moves. Right shoulder about the same. Nurse says he was drinking coffee all night  ROS: Patient denies fever, rash, sore throat, blurred vision, dizziness, nausea, vomiting, diarrhea, cough, shortness of breath or chest pain,   headache, or mood change.   Objective:   No results found. No results for input(s): "WBC", "HGB", "HCT", "PLT" in the last 72 hours.  No results for input(s): "NA", "K", "CL", "CO2", "GLUCOSE", "BUN", "CREATININE", "CALCIUM" in the last 72 hours.   Intake/Output Summary (Last 24 hours) at 09/20/2023 0806 Last data filed at 09/20/2023 0731 Gross per 24 hour  Intake 1460 ml  Output 1365 ml  Net 95 ml        Physical Exam: Vital Signs Blood pressure (!) 145/76, pulse 64, temperature 97.8 F (36.6 C), temperature source Oral, resp. rate 18, height 5\' 9"  (1.753 m), weight 73.2 kg, SpO2 99%.  Constitutional: No distress . Vital signs reviewed. HEENT: NCAT, EOMI, oral membranes moist Neck: supple Cardiovascular: RRR without murmur. No JVD    Respiratory/Chest: CTA Bilaterally without wheezes or rales. Normal effort    GI/Abdomen: BS +, non-tender, non-distended Ext: no clubbing, cyanosis, or edema Psych: irritable  Neurological: Ox3   MSK- R shoulder pain with ER/IR, k-tape in place, pain with flexion and abduction also  Skin: No evidence of breakdown, no evidence of rash   Neurologic: Cranial nerves II through XII intact, motor strength is 5/5 in bilateral deltoid, bicep, tricep, grip,3-/5 B  hip flexor, knee extensors,4-  ankle dorsiflexor and plantar flexor Sensory exam reduced LT below ankle on the right side and below supramalleolar area on left side  R>L hand intrinsic atrophy --no changes 11/17 Musculoskeletal: Full range of motion in all 4 extremities. No joint swelling   Assessment/Plan: 1.  Functional deficits which require 3+ hours per day of interdisciplinary therapy in a comprehensive inpatient rehab setting. Physiatrist is providing close team supervision and 24 hour management of active medical problems listed below. Physiatrist and rehab team continue to assess barriers to discharge/monitor patient progress toward functional and medical goals  Care Tool:  Bathing    Body parts bathed by patient: Right arm, Left arm, Chest, Abdomen, Front perineal area, Face   Body parts bathed by helper: Right upper leg, Left upper leg, Right lower leg, Left lower leg, Buttocks     Bathing assist Assist Level: Maximal Assistance - Patient 24 - 49%     Upper Body Dressing/Undressing Upper body dressing        Upper body assist Assist Level: Minimal Assistance - Patient > 75%    Lower Body Dressing/Undressing Lower body dressing      What is the patient wearing?: Underwear/pull up, Pants     Lower body assist Assist for lower body dressing: Maximal Assistance - Patient 25 - 49%     Toileting Toileting    Toileting assist Assist for toileting: Maximal Assistance - Patient 25 - 49%     Transfers Chair/bed transfer  Transfers assist     Chair/bed transfer assist level: Minimal Assistance - Patient > 75%     Locomotion Ambulation  Ambulation assist   Ambulation activity did not occur: Safety/medical concerns  Assist level: Minimal Assistance - Patient > 75% Assistive device: Walker-rolling Max distance: 65ft   Walk 10 feet activity   Assist  Walk 10 feet activity did not occur: Safety/medical concerns  Assist level: Minimal Assistance - Patient > 75% Assistive device: Walker-rolling   Walk 50 feet activity   Assist Walk 50 feet with 2 turns activity did not occur: Safety/medical concerns  Assist level: Minimal Assistance - Patient > 75% Assistive device: Walker-rolling    Walk 150 feet activity   Assist Walk 150 feet activity did not  occur: Safety/medical concerns         Walk 10 feet on uneven surface  activity   Assist Walk 10 feet on uneven surfaces activity did not occur: Safety/medical concerns         Wheelchair     Assist Is the patient using a wheelchair?: Yes Type of Wheelchair: Manual    Wheelchair assist level: Dependent - Patient 0% Max wheelchair distance: 10 ft    Wheelchair 50 feet with 2 turns activity    Assist        Assist Level: Dependent - Patient 0%   Wheelchair 150 feet activity     Assist      Assist Level: Dependent - Patient 0%   Blood pressure (!) 145/76, pulse 64, temperature 97.8 F (36.6 C), temperature source Oral, resp. rate 18, height 5\' 9"  (1.753 m), weight 73.2 kg, SpO2 99%.  Medical Problem List and Plan: 1. Functional deficits secondary to lumbar stenosis with radiculopathy s/p  L2-L5 DLIF with instrumentation 10/29 with Dr. Maisie Fus             -patient may shower             -ELOS/Goals: 10-14 days, Mod I PT/OT D/c 11/21  -Continue CIR therapies including PT, OT  Con't K taping of R shoulder 2.  Antithrombotics: -DVT/anticoagulation:  Pharmaceutical: Heparin             -antiplatelet therapy: none   3. Pain Management: Tylenol, Robaxin, oxycodone as needed             -Continue gabapentin 100 mg twice daily Cont oxy IR 10mg  q 4 prn, if somnolent consider change to hydromorphone given renal status    11/11- not lethargic- so will maintain dosing for now  11/12- having more pain due to soreness from therapies- will maintain regimen- has Robaxin as needed for muscle spasms  11/13- got Xray of R shoulder- shows signs of RTC- but pt also having concern about fx?-xray results suggested a CT- but computer wanted me to get MRI- R shoulder MRI ordered- will add Voltaren gel 2g QID to try and help inflammation.   11/14- pain doing MUCH better- con't regimen- Ortho referral when discharged  11/15- pain much better- now intermittent in R shoulder-  still having leg pain- sounds like from therapy  11/16-17 pt reports that he's in ongoing pain. However he's not using oxycodone but 3 x per day over last 2 days. Will hold off any changes today as pain appears generally improved. 4. Mood/Behavior/Sleep: LCSW to evaluate and provide emotional support             -antipsychotic agents: n/a   5. Neuropsych/cognition: This patient is capable of making decisions on his own behalf.   6. Skin/Wound Care: Routine skin care checks              -  R toe sutures removed on admission, WOC order for betadyne BID   7. Fluids/Electrolytes/Nutrition: Strict Is and Os and follow-up chemistries   8: Hypertension: monitor TID and prn; appears stable on oral agents             -continue amlodipine 10 mg daily             -continue carvedilol 3.125 mg twice daily Vitals:   09/19/23 1928 09/20/23 0434  BP: 114/62 (!) 145/76  Pulse: 68 64  Resp: 18 18  Temp: 98.3 F (36.8 C) 97.8 F (36.6 C)  SpO2: 97% 99%  Controlled 11/9  11/11- BP controlled in last 24 hours, but was real high yesterday early AM- however nothing like that since- not sure if was correct? Not clear   11/16 bp controlled--no changes 9: Diabetes mellitus: CBGs 4 times daily; A1c = 5.7%             -continue sliding scale insulin 0 to 15 units             -? resume home Lantus 5 units daily   CBG (last 3)  Recent Labs    09/19/23 1619 09/19/23 2056 09/20/23 0538  GLUCAP 127* 129* 77   Controlled 11/10  11/11- CBGs controlled- look good-c on't regimen  11/12- not on Home dose Lantus, but CBGs look great- will not rstart right now  11/6  controlled no changes 10: Urinary retention, acute, h/o BPH:              -had indwelling Foley catheter -continue Flomax 0.4 mg daily  -Foley placed 10/30; DC foley trial with ISC Q6H for PVR >350 ccs to start in AM 11/9   11/11- foley out-= last PVR 108cc- will monitor with bladder scans  11/15- peeing well- scans look OK- running 0-150s- on  Flomax- 0.4 mg daily 11: AKI/CKD stage IV: Nearing need for hemodialysis             -Nephrology signed off, he has had LUE AV fistula created 11/7 (follow-up arranged with [facility])- pt will f/u with Dr Wolfgang Phoenix as OP              -On Calcitrol, PhosLo    11/11- looking slightly better this AM 12: 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 Hx IBS but no further diarrhea   11/12- LBM last night  11/14- changed Colace to prn- and miralax is prn- pt had 2 bowel accidents from meds- doesn't want anymore  11/15- small BM yesterday- otherwise 2 days ago 16: Right 4th toe laceration PTA: well-healed, discontinue sutures.    17. ? R biceps tear - documented per record in 2022 by Dr. Hilda Lias at [place]. Likely lost to follow up; would re-establish as OP.   11/15- will send referral to [facility] at d/c- since has severe multiple muscle RTC tear of R shoulder-    11/17 continue k-tape, pain mgt as above 18. Hx cervical stenosis s/p C4-T1 PCDF 2022, then C4/5 ACDF 11/2022 with Dr. Maisie Fus. Pain regimen as above.  19.  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

## 2023-09-20 NOTE — Progress Notes (Signed)
Physical Therapy Weekly Progress Note  Patient Details  Name: [name] MRN: [id number] Date of Birth: [birth year]  Beginning of progress report period: September 12, 2023 End of progress report period: September 20, 2023  Today's Date: 09/20/2023 PT Individual Time: 1018-1100 PT Individual Time Calculation (min): 42 min   Patient has met 5 of 5 short term goals.  Patient is making steady progress toward long term goals and educated on breathing/postural control along with spine precuations with mobility. Pt largely requires supervision with bed mobility and CGA with transfers, gait, and navigation of 5 inch curb step with RW (simulate home set-up). Plan to continue to increase activity tolerance, strength, and balance to better prepare patient for discharge.   Patient continues to demonstrate the following deficits muscle weakness, decreased cardiorespiratoy endurance, and decreased standing balance and decreased postural control and therefore will continue to benefit from skilled PT intervention to increase functional independence with mobility.  Patient progressing toward long term goals..  Continue plan of care.  PT Short Term Goals Week 1:  PT Short Term Goal 1 (Week 1): Pt will perform bed mobility with overall CGA/ minA using bed features. PT Short Term Goal 1 - Progress (Week 1): Met PT Short Term Goal 2 (Week 1): Pt will perform sit<>stand transfers with overall CGA. PT Short Term Goal 2 - Progress (Week 1): Met PT Short Term Goal 3 (Week 1): Pt will perform stand pivot transfers with overall MinA. PT Short Term Goal 3 - Progress (Week 1): Met PT Short Term Goal 4 (Week 1): Pt will initiate gait training. PT Short Term Goal 4 - Progress (Week 1): Met PT Short Term Goal 5 (Week 1): Pt will initiate w/c mobility training. PT Short Term Goal 5 - Progress (Week 1): Met Week 2:  PT Short Term Goal 1 (Week 2): STG's=LTG's due to ELOS  Skilled Therapeutic Interventions/Progress  Updates:      Therapy Documentation Precautions:  Precautions Precautions: Back, Fall Precaution Comments: decreased sensation in RLE Required Braces or Orthoses: Spinal Brace Spinal Brace: Lumbar corset, Applied in sitting position Restrictions Weight Bearing Restrictions: No Other Position/Activity Restrictions: Pt required assist to don brace.  Pt received on [facility] with nurse tech and PT present for handoff. Pt continent of bowel and incontinent of bladder, nurse tech notified for documentation. Pt supervision for toileting and CGA with transfer with RW to w/c. Pt supervision for propulsion to/from main gym. Pt navigated 5 inch curb step x 6 with RW and CGA with cues for sequencing and walker proximity. Pt educated to avoid twisting with turns during ambulation and to instead take several small steps, PT demonstrated and pt understanding to feedback. Pt left seated at w/c at bedside with all needs in reach and alarm on.    Therapy/Group: Individual Therapy  Truitt Leep Truitt Leep PT, DPT  09/20/2023, 12:32 PM

## 2023-09-21 LAB — GLUCOSE, CAPILLARY
Glucose-Capillary: 79 mg/dL (ref 70–99)
Glucose-Capillary: 101 mg/dL — ABNORMAL HIGH (ref 70–99)
Glucose-Capillary: 134 mg/dL — ABNORMAL HIGH (ref 70–99)
Glucose-Capillary: 128 mg/dL — ABNORMAL HIGH (ref 70–99)

## 2023-09-21 LAB — BASIC METABOLIC PANEL
Anion gap: 9 (ref 5–15)
BUN: 74 mg/dL — ABNORMAL HIGH (ref 8–23)
CO2: 19 mmol/L — ABNORMAL LOW (ref 22–32)
Calcium: 8.7 mg/dL — ABNORMAL LOW (ref 8.9–10.3)
Chloride: 109 mmol/L (ref 98–111)
Creatinine, Ser: 4.57 mg/dL — ABNORMAL HIGH (ref 0.61–1.24)
GFR, Estimated: 14 mL/min — ABNORMAL LOW (ref >60)
Glucose, Bld: 88 mg/dL (ref 70–99)
Potassium: 5.4 mmol/L — ABNORMAL HIGH (ref 3.5–5.1)
Sodium: 137 mmol/L (ref 135–145)

## 2023-09-21 LAB — CBC
HCT: 29.2 % — ABNORMAL LOW (ref 39.0–52.0)
Hemoglobin: 9 g/dL — ABNORMAL LOW (ref 13.0–17.0)
MCH: 29.2 pg (ref 26.0–34.0)
MCHC: 30.8 g/dL (ref 30.0–36.0)
MCV: 94.8 fL (ref 80.0–100.0)
Platelets: 348 10*3/uL (ref 150–400)
RBC: 3.08 MIL/uL — ABNORMAL LOW (ref 4.22–5.81)
RDW: 14.1 % (ref 11.5–15.5)
WBC: 7.2 10*3/uL (ref 4.0–10.5)
nRBC: 0 % (ref 0.0–0.2)

## 2023-09-21 MED ORDER — SODIUM ZIRCONIUM CYCLOSILICATE 10 G PO PACK
10.0000 g | PACK | Freq: Once | ORAL | Status: AC
  Administered 2023-09-21: 10 g via ORAL
  Filled 2023-09-21 (×2): qty 1

## 2023-09-21 NOTE — Progress Notes (Signed)
Physical Therapy Session Note  Patient Details  Name: Curtis Hartman MRN: [id number] Date of Birth: 26-Jun-1961  Today's Date: 09/21/2023 PT Individual Time: 2130-8657 PT Individual Time Calculation (min): 72 min   Short Term Goals: Week 2:  PT Short Term Goal 1 (Week 2): STG's=LTG's due to ELOS  Skilled Therapeutic Interventions/Progress Updates:    Pt presents in room in bed, agreeable to PT with encouragement with pt reporting back pain. RN notified and medicates at beginning of session. Session focused on therapeutic exercise for BLE strengthening and tolerance to upright as well as therapeutic activities for transfer training, education, and participation with self care tasks.  Pt completes bed mobility with supervision, increased time, completes log roll and maintains spinal precautions. Therapist dons brace with total assist while pt sitting EOB. Pt completes stand pivot transfer with CGA demonstrates decreased proximity to RW during transfer.  Pt self propels WC with BUEs to main gym as warm up and to promote improved tolerance to activity.  Pt completes standing therex in //bars for BLE strengthening and standing tolerance including: - SLR red tband x10 - hip abduction red tband x10 - hamstring curl x10 BLE - hip extension x10 BLE - side stepping 4x6' bilaterally  Pt reporting concern about fistula in LUE following WC mobility, RN notified and assesses who communicates with PA, presents to assess and educates pt on normal progression of fistula and no mobility restrictions with LUE. Pt verbalizing understanding and reporting feeling better after speaking with PA.  Pt reports bowel urgency following standing therex and requesting immediate assistance to nearest bathroom. Pt taken to bathroom via WC and pt completes stand pivot transfer from [facility] to toilet with CGA and RW. Pt requires increased time on toilet for continent bowel movements, several in one sitting, continent and  charted. Pt completes several sit<>stands from toilet to RW CGA to complete periarea hygiene throughout, education on periarea hygiene technique. Pt able to manage clothing with unilateral UE support on RW with CGA.  Pt returned to room via WC dependently for time management and energy conservation. Pt requesting to sit on BSC at end of session due to feeling need to have BM. Pt completes ambulatory transfer 20' with RW CGA, decreased proximity to RW. Pt able to manage clothing in standing with CGA and comes to sitting on BSC. Pt remains seated on BSC with call light in place, all needs within reach, NT notified of pt position at end of session.  Therapy Documentation Precautions:  Precautions Precautions: Back, Fall Precaution Comments: decreased sensation in RLE Required Braces or Orthoses: Spinal Brace Spinal Brace: Lumbar corset, Applied in sitting position Restrictions Weight Bearing Restrictions: No Other Position/Activity Restrictions: Pt required assist to don brace.    Therapy/Group: Individual Therapy  Edwin Cap PT, DPT 09/21/2023, 3:52 PM

## 2023-09-21 NOTE — Plan of Care (Signed)
  Problem: RH SKIN INTEGRITY Goal: RH STG SKIN FREE OF INFECTION/BREAKDOWN Description: Incision will remain intact and free of infection/breakdown with min assist  Outcome: Progressing Goal: RH STG MAINTAIN SKIN INTEGRITY WITH ASSISTANCE Description: STG Maintain Skin Integrity With cueing Assistance. Outcome: Progressing   Problem: RH SAFETY Goal: RH STG ADHERE TO SAFETY PRECAUTIONS W/ASSISTANCE/DEVICE Description: STG Adhere to Safety Precautions With cueing Assistance/Device. Outcome: Progressing   Problem: RH PAIN MANAGEMENT Goal: RH STG PAIN MANAGED AT OR BELOW PT'S PAIN GOAL Description: Less than 4 with PRN medications min assist  Outcome: Progressing

## 2023-09-21 NOTE — Progress Notes (Signed)
Physical Therapy Session Note  Patient Details  Name: Curtis Hartman MRN: [id number] Date of Birth: 08-03-1961  Today's Date: 09/21/2023 PT Individual Time: 1000-1045 PT Individual Time Calculation (min): 45 min   Short Term Goals: Week 1:  PT Short Term Goal 1 (Week 1): Pt will perform bed mobility with overall CGA/ minA using bed features. PT Short Term Goal 1 - Progress (Week 1): Met PT Short Term Goal 2 (Week 1): Pt will perform sit<>stand transfers with overall CGA. PT Short Term Goal 2 - Progress (Week 1): Met PT Short Term Goal 3 (Week 1): Pt will perform stand pivot transfers with overall MinA. PT Short Term Goal 3 - Progress (Week 1): Met PT Short Term Goal 4 (Week 1): Pt will initiate gait training. PT Short Term Goal 4 - Progress (Week 1): Met PT Short Term Goal 5 (Week 1): Pt will initiate w/c mobility training. PT Short Term Goal 5 - Progress (Week 1): Met  Skilled Therapeutic Interventions/Progress Updates:    pt received in bed and agreeable to therapy. Pt reports 5/10 pain in back, anterior thighs, and neck, premedicated. Rest and positioning provided as needed. At end of session, reports, "more than 10/10." No further interventions at this time.   Bed mobility with supervision and bed features. Discussed pt's home set up and bed.  Stand pivot transfer with CGA, pt tends to not use RW, resulting in twisting. Therapist recommended using  walker to maintain precautions and reduce pain.   Nustep at level 3, 3 x 3 min, educated on pacing and low pain movements for endurance and to reduce stiffness. Pt then ambulated back to room with CGA and RW, returning to bed with supervision. Pt was left with all needs in reach and alarm active.   Therapy Documentation Precautions:  Precautions Precautions: Back, Fall Precaution Comments: decreased sensation in RLE Required Braces or Orthoses: Spinal Brace Spinal Brace: Lumbar corset, Applied in sitting  position Restrictions Weight Bearing Restrictions: No Other Position/Activity Restrictions: Pt required assist to don brace. General:       Therapy/Group: Individual Therapy  Juluis Rainier 09/21/2023, 10:25 AM

## 2023-09-21 NOTE — Progress Notes (Signed)
Physical Therapy Session Note  Patient Details  Name: Curtis Hartman MRN: [id number] Date of Birth: [birth year]  Today's Date: 09/21/2023 PT Individual Time: 4098-1191 PT Individual Time Calculation (min): 32 min   Short Term Goals: Week 2:  PT Short Term Goal 1 (Week 2): STG's=LTG's due to ELOS  Skilled Therapeutic Interventions/Progress Updates:      Therapy Documentation Precautions:  Precautions Precautions: Back, Fall Precaution Comments: decreased sensation in RLE Required Braces or Orthoses: Spinal Brace Spinal Brace: Lumbar corset, Applied in sitting position Restrictions Weight Bearing Restrictions: No Other Position/Activity Restrictions: Pt required assist to don brace.   Pt agreeable to PT session in hospital room due to frequent bowel movements and diarrhea. Pt with unrated back and R LE groin pain, provided rest and repositioning for relief.   Pt supervision for toileting throughout session with use of BSC.   Pt requires CGA with 6 inch step taps that progressed to step up' s 2 x 5 to simulate stair navigation.   Pt deferred additional PT 2/2 fatigue and abdominal discomfort. Pt left semi-reclined in bed with all needs in reach with alarm on.   Therapy/Group: Individual Therapy  Truitt Leep Truitt Leep PT, DPT  09/21/2023, 12:19 PM

## 2023-09-21 NOTE — Progress Notes (Signed)
Occupational Therapy Session Note  Patient Details  Name: Curtis Hartman MRN: [id number] Date of Birth: Nov 26, 1960  Today's Date: 09/21/2023 OT Individual Time: 0801-0900 OT Individual Time Calculation (min): 59 min    Short Term Goals: Week 2:  OT Short Term Goal 1 (Week 2): STG=LTG d/t ELOS  Skilled Therapeutic Interventions/Progress Updates:      Therapy Documentation Precautions:  Precautions Precautions: Back, Fall Precaution Comments: decreased sensation in RLE Required Braces or Orthoses: Spinal Brace Spinal Brace: Lumbar corset, Applied in sitting position Restrictions Weight Bearing Restrictions: No Other Position/Activity Restrictions: Pt required assist to don brace. General: "The tape on my shoulder helped" Pt supine in bed upon OT arrival, agreeable to OT session. handoff from NT from toileting with pt.   Pain:  9/10 pain reported in low back, activity, intermittent rest breaks, distractions provided for pain management, pt reports tolerable to proceed.    ADL: Bed mobility: SBA supine><EOB UB dressing: pt able to don/doff TLSO  Exercises: Pt issued UE theraband HEP in order to increase functional strength, andurance and activity tolerance in order to increase independence in ADLs such as bathing. Pt issued green and blue theraband and discussed direction/technique of exercises, demonstrating verbal understanding. Pt completed 3x10 exercises at bed level listed below: -elbow extensions - shoulder horizontal abduction -bicep curls  -shoulder flexion -diagonal shoulder flexion -external rotation Pt given extra theraband to use at home. Pt will be given handout at next session.   Other Treatments: OT applied KT tape to low back and Rt shoulder in order to provide stability and decrease pain with use. Pt educated on risks and benefits of tape.   Pt supine in bed with bed alarm activated, 2 bed rails up, call light within reach and 4Ps  assessed.   Therapy/Group: Individual Therapy  Velia Meyer, OTD, OTR/L 09/21/2023, 12:14 PM

## 2023-09-21 NOTE — Progress Notes (Signed)
PROGRESS NOTE   Subjective/Complaints:   Pt reports pain about the same-  ?Same old, same old".  LBM yesterday Was loose- but only x1.     ROS:    Pt denies SOB, abd pain, CP, N/V/C/D, and vision changes   Objective:   No results found. Recent Labs    09/21/23 0519  WBC 7.2  HGB 9.0*  HCT 29.2*  PLT 348    Recent Labs    09/21/23 0519  NA 137  K 5.4*  CL 109  CO2 19*  GLUCOSE 88  BUN 74*  CREATININE 4.57*  CALCIUM 8.7*     Intake/Output Summary (Last 24 hours) at 09/21/2023 0837 Last data filed at 09/21/2023 0813 Gross per 24 hour  Intake 1420 ml  Output 950 ml  Net 470 ml        Physical Exam: Vital Signs Blood pressure (!) 147/70, pulse 65, temperature 98 F (36.7 C), temperature source Oral, resp. rate 16, height 5\' 9"  (1.753 m), weight 76.2 kg, SpO2 99%.   General: awake, alert, appropriate, sitting up eating breakfast; NAD HENT: conjugate gaze; oropharynx moist CV: regular rate and rhythm; no JVD Pulmonary: CTA B/L; no W/R/R- good air movement GI: soft, NT, ND, (+)BS Psychiatric: appropriate- less irritable Neurological: Ox3    MSK- R shoulder pain with ER/IR, k-tape in place, pain with flexion and abduction also  Skin: No evidence of breakdown, no evidence of rash   Neurologic: Cranial nerves II through XII intact, motor strength is 5/5 in bilateral deltoid, bicep, tricep, grip,3-/5 B  hip flexor, knee extensors,4-  ankle dorsiflexor and plantar flexor Sensory exam reduced LT below ankle on the right side and below supramalleolar area on left side  R>L hand intrinsic atrophy --no changes 11/17 Musculoskeletal: Full range of motion in all 4 extremities. No joint swelling   Assessment/Plan: 1. Functional deficits which require 3+ hours per day of interdisciplinary therapy in a comprehensive inpatient rehab setting. Physiatrist is providing close team supervision and 24 hour  management of active medical problems listed below. Physiatrist and rehab team continue to assess barriers to discharge/monitor patient progress toward functional and medical goals  Care Tool:  Bathing    Body parts bathed by patient: Right arm, Left arm, Chest, Abdomen, Front perineal area, Face   Body parts bathed by helper: Right upper leg, Left upper leg, Right lower leg, Left lower leg, Buttocks     Bathing assist Assist Level: Maximal Assistance - Patient 24 - 49%     Upper Body Dressing/Undressing Upper body dressing        Upper body assist Assist Level: Minimal Assistance - Patient > 75%    Lower Body Dressing/Undressing Lower body dressing      What is the patient wearing?: Underwear/pull up, Pants     Lower body assist Assist for lower body dressing: Maximal Assistance - Patient 25 - 49%     Toileting Toileting    Toileting assist Assist for toileting: Maximal Assistance - Patient 25 - 49%     Transfers Chair/bed transfer  Transfers assist     Chair/bed transfer assist level: Contact Guard/Touching assist     Locomotion Ambulation  Ambulation assist   Ambulation activity did not occur: Safety/medical concerns  Assist level: Contact Guard/Touching assist Assistive device: Walker-rolling Max distance: 150 ft   Walk 10 feet activity   Assist  Walk 10 feet activity did not occur: Safety/medical concerns  Assist level: Contact Guard/Touching assist Assistive device: Walker-rolling   Walk 50 feet activity   Assist Walk 50 feet with 2 turns activity did not occur: Safety/medical concerns  Assist level: Contact Guard/Touching assist Assistive device: Walker-rolling    Walk 150 feet activity   Assist Walk 150 feet activity did not occur: Safety/medical concerns  Assist level: Contact Guard/Touching assist Assistive device: Walker-rolling    Walk 10 feet on uneven surface  activity   Assist Walk 10 feet on uneven surfaces  activity did not occur: Safety/medical concerns         Wheelchair     Assist Is the patient using a wheelchair?: Yes Type of Wheelchair: Manual    Wheelchair assist level: Supervision/Verbal cueing Max wheelchair distance: 250 ft    Wheelchair 50 feet with 2 turns activity    Assist        Assist Level: Supervision/Verbal cueing   Wheelchair 150 feet activity     Assist      Assist Level: Supervision/Verbal cueing   Blood pressure (!) 147/70, pulse 65, temperature 98 F (36.7 C), temperature source Oral, resp. rate 16, height 5\' 9"  (1.753 m), weight 76.2 kg, SpO2 99%.  Medical Problem List and Plan: 1. Functional deficits secondary to lumbar stenosis with radiculopathy s/p  L2-L5 DLIF with instrumentation 10/29 with Dr. Maisie Fus             -patient may shower             -ELOS/Goals: 10-14 days, Mod I PT/OT D/c 11/21  Con't CIR PT and OT- asking how long needs to stay- we discussed d/w date Con't K taping of R shoulder 2.  Antithrombotics: -DVT/anticoagulation:  Pharmaceutical: Heparin             -antiplatelet therapy: none   3. Pain Management: Tylenol, Robaxin, oxycodone as needed             -Continue gabapentin 100 mg twice daily Cont oxy IR 10mg  q 4 prn, if somnolent consider change to hydromorphone given renal status    11/11- not lethargic- so will maintain dosing for now  11/12- having more pain due to soreness from therapies- will maintain regimen- has Robaxin as needed for muscle spasms  11/13- got Xray of R shoulder- shows signs of RTC- but pt also having concern about fx?-xray results suggested a CT- but computer wanted me to get MRI- R shoulder MRI ordered- will add Voltaren gel 2g QID to try and help inflammation.   11/14- pain doing MUCH better- con't regimen- Ortho referral when discharged  11/15- pain much better- now intermittent in R shoulder- still having leg pain- sounds like from therapy  11/16-17 pt reports that he's in ongoing  pain. However he's not using oxycodone but 3 x per day over last 2 days. Will hold off any changes today as pain appears generally improved.  11/18- Pain stable on Oxy- con't regimen 4. Mood/Behavior/Sleep: LCSW to evaluate and provide emotional support             -antipsychotic agents: n/a   5. Neuropsych/cognition: This patient is capable of making decisions on his own behalf.   6. Skin/Wound Care: Routine skin care checks              -  R toe sutures removed on admission, WOC order for betadyne BID   7. Fluids/Electrolytes/Nutrition: Strict Is and Os and follow-up chemistries   8: Hypertension: monitor TID and prn; appears stable on oral agents             -continue amlodipine 10 mg daily             -continue carvedilol 3.125 mg twice daily Vitals:   09/20/23 1923 09/21/23 0317  BP: 135/74 (!) 147/70  Pulse: 67 65  Resp: 18 16  Temp: 97.9 F (36.6 C) 98 F (36.7 C)  SpO2: 98% 99%  Controlled 11/9  11/11- BP controlled in last 24 hours, but was real high yesterday early AM- however nothing like that since- not sure if was correct? Not clear   11/16 bp controlled--no changes 9: Diabetes mellitus: CBGs 4 times daily; A1c = 5.7%             -continue sliding scale insulin 0 to 15 units             -? resume home Lantus 5 units daily   CBG (last 3)  Recent Labs    09/20/23 1626 09/20/23 2043 09/21/23 0540  GLUCAP 123* 103* 79   Controlled 11/10  11/11- CBGs controlled- look good-c on't regimen  11/12- not on Home dose Lantus, but CBGs look great- will not rstart right now  11/6  controlled no changes  11/18- Cbgs look great- no home Lantus 10: Urinary retention, acute, h/o BPH:              -had indwelling Foley catheter -continue Flomax 0.4 mg daily  -Foley placed 10/30; DC foley trial with ISC Q6H for PVR >350 ccs to start in AM 11/9   11/11- foley out-= last PVR 108cc- will monitor with bladder scans  11/15- peeing well- scans look OK- running 0-150s- on Flomax- 0.4  mg daily 11: AKI/CKD stage IV: Nearing need for hemodialysis             -Nephrology signed off, he has had LUE AV fistula created 11/7 (follow-up arranged with [facility])- pt will f/u with Dr Wolfgang Phoenix as OP              -On Calcitrol, PhosLo    11/11- looking slightly better this AM  11/18- Calcium 8.7 12: 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 Hx IBS but no further diarrhea   11/12- LBM last night  11/14- changed Colace to prn- and miralax is prn- pt had 2 bowel accidents from meds- doesn't want anymore  11/15- small BM yesterday- otherwise 2 days ago  11/18- LBM yesterday- loose 16: Right 4th toe laceration PTA: well-healed, discontinue sutures.    17. ? R biceps tear - documented per record in 2022 by Dr. Hilda Lias at [place]. Likely lost to follow up; would re-establish as OP.   11/15- will send referral to [facility] at d/c- since has severe multiple muscle RTC tear of R shoulder-    11/17 continue k-tape, pain  mgt as above 18. Hx cervical stenosis s/p C4-T1 PCDF 2022, then C4/5 ACDF 11/2022 with Dr. Maisie Fus. Pain regimen as above.     19.  Mild hyperK+ due to CKD    11/11- K+ down to 5.0  11/17 f/u labs Monday  11/18- K+ 5.4- will give Lokelma- 10 G x1   LOS: 10 days A FACE TO FACE EVALUATION WAS PERFORMED  [place] 09/21/2023, 8:37 AM

## 2023-09-22 LAB — GLUCOSE, CAPILLARY
Glucose-Capillary: 92 mg/dL (ref 70–99)
Glucose-Capillary: 100 mg/dL — ABNORMAL HIGH (ref 70–99)
Glucose-Capillary: 127 mg/dL — ABNORMAL HIGH (ref 70–99)
Glucose-Capillary: 99 mg/dL (ref 70–99)

## 2023-09-22 NOTE — Progress Notes (Signed)
Physical Therapy Session Note  Patient Details  Name: Curtis Hartman MRN: [id number] Date of Birth: 1961/05/08  Today's Date: 09/22/2023 PT Individual Time: 6213-0865 PT Individual Time Calculation (min): 60 min   Short Term Goals: Week 2:  PT Short Term Goal 1 (Week 2): STG's=LTG's due to ELOS  Skilled Therapeutic Interventions/Progress Updates:      Therapy Documentation Precautions:  Precautions Precautions: Back, Fall Precaution Comments: decreased sensation in RLE Required Braces or Orthoses: Spinal Brace Spinal Brace: Lumbar corset, Applied in sitting position Restrictions Weight Bearing Restrictions: No Other Position/Activity Restrictions: Pt required assist to don brace.   Treatment Session 1:   Pt agreeable to PT session with emphasis on global lower extremity strength training and flexibility. Pt with unrated back, right quad, and right adductor pain. Pt pre-medicated and provided rest/repositioning for relief.   Pt received on toielt, educated to not mobilize unless nursing or therapy staff present. Pt continent of bowel and supervision for toileting.   Pt supervision with RW ambulation 150 ft hospital room <>ortho gym. Pt particpated in blocked practice of sit<>stand without UE support with tactile cue of ball to encourage forward flexion followed by mini squats 2 x 6 for each actiivty.   Pt reports tightness to groin, PT assessed adductor and quad length in supine and performed passive flexibility in modified thomas position with manual therapy (cross friction and tirgger point release) to right rectus femoris.   Pt min A with supine to sit with improper form and PT educated pt on log roll strategy to minize pain. Pt left semi-reclined in bed with all needs in reach and alarm on.    Treatment Session 2:   Pt agreeable to PT session and with unrated back and hip pain bilaterally, provided rest and repositioning for relief. Pt educated on TA activation in  insolation and engaged with following LE exercises to address trunk stability deficits:   -hooklying clam shells 2 x 3   -hooklying alternating marches 2 x 3   -heel slides 2 x 3   -hooklying gluteal sets x 20 (3 seconds hold)   Plan to establish HEP for pt use following discharge and pt left semi-reclined in bed with all needs in reach and alarm on.   Therapy/Group: Individual Therapy  Truitt Leep Truitt Leep PT, DPT  09/22/2023, 8:41 AM

## 2023-09-22 NOTE — Progress Notes (Signed)
Patient ID: Curtis Hartman, male   DOB: [birth year], 63 y.o.   MRN: [id number]  Met with pt to give him the team conference update regarding reaching goals for discharge on Thursday 11/21. He feels ready he wants to know what would keep him here and informed him medical issues if he was having any. Just came from the MD in the team conference and was told he would be ready for Thursday. Have made referral to [facility] for OPPT where he has gone before. Also have ordered a rolling walker and to be delivered to his room prior to discharge home. Work toward discharge date for Thursday

## 2023-09-22 NOTE — Plan of Care (Signed)
  Problem: Consults Goal: RH SPINAL CORD INJURY PATIENT EDUCATION Description:  See Patient Education module for education specifics.  Outcome: Progressing   Problem: RH SKIN INTEGRITY Goal: RH STG SKIN FREE OF INFECTION/BREAKDOWN Description: Incision will remain intact and free of infection/breakdown with min assist  Outcome: Progressing Goal: RH STG MAINTAIN SKIN INTEGRITY WITH ASSISTANCE Description: STG Maintain Skin Integrity With cueing Assistance. Outcome: Progressing Goal: RH STG ABLE TO PERFORM INCISION/WOUND CARE W/ASSISTANCE Description: STG Able To Perform Incision/Wound Care With min Assistance. Outcome: Progressing   Problem: RH SAFETY Goal: RH STG ADHERE TO SAFETY PRECAUTIONS W/ASSISTANCE/DEVICE Description: STG Adhere to Safety Precautions With cueing Assistance/Device. Outcome: Progressing Goal: RH STG DECREASED RISK OF FALL WITH ASSISTANCE Description: STG Decreased Risk of Fall With min Assistance. Outcome: Progressing   Problem: RH PAIN MANAGEMENT Goal: RH STG PAIN MANAGED AT OR BELOW PT'S PAIN GOAL Description: Less than 4 with PRN medications min assist  Outcome: Progressing   Problem: RH KNOWLEDGE DEFICIT SCI Goal: RH STG INCREASE KNOWLEDGE OF SELF CARE AFTER SCI Description: Patient/caregiver will be able to managed medications, adhere to back precautions, manage bladder and bowel activities, and diet/life style modifications to improve health from nursing education and nursing handouts independently  Outcome: Progressing

## 2023-09-22 NOTE — Progress Notes (Signed)
Occupational Therapy Session Note  Patient Details  Name: Curtis Hartman MRN: [id number] Date of Birth: May 06, 1961  Today's Date: 09/22/2023 OT Individual Time: 8469-6295 OT Individual Time Calculation (min): 55 min    Short Term Goals: Week 2:  OT Short Term Goal 1 (Week 2): STG=LTG d/t ELOS  Skilled Therapeutic Interventions/Progress Updates:      Therapy Documentation Precautions:  Precautions Precautions: Back, Fall Precaution Comments: decreased sensation in RLE Required Braces or Orthoses: Spinal Brace Spinal Brace: Lumbar corset, Applied in sitting position Restrictions Weight Bearing Restrictions: No Other Position/Activity Restrictions: Pt required assist to don brace. General: "This shoulder is killing me" Pt supine in bed upon OT arrival, agreeable to OT session.  Pain:  10/10 pain reported in shoulders and back, activity, intermittent rest breaks, distractions provided for pain management, pt reports tolerable to proceed.   ADL: Bed mobility: SBA supine from flat bed><EOB Transfers: SBA with RW for al transfers/mobility this date   Exercises: Pt issued seated UE kinesthetic/theraband HEP in order to increase functional strength, endurance and activity tolerance in order to increase independence in ADLs such as bathing. Pt and OT discussed direction/technique of exercises, demonstrating verbal understanding. Pt completed 3x10 exercises at W/C level listed below:  Exercises - Seated Shoulder Shrugs  - 1 x daily - 7 x weekly - 3 sets - 10 reps - Seated Boxing Jabs  - 1 x daily - 7 x weekly - 3 sets - 10 reps - Seated Scapular Protraction and Retraction  - 1 x daily - 7 x weekly - 3 sets - 10 reps - Seated Elbow Extension with Self-Anchored Resistance  - 1 x daily - 7 x weekly - 3 sets - 10 reps - Seated Elbow Flexion with Self-Anchored Resistance  - 1 x daily - 7 x weekly - 3 sets - 10 reps   Other Treatments: Pt completed standing pendulum exercises on BUE in  order to decrease BUE shoulder pain d/t pt reports of increased pain. OT instructed pt to complete at home.   Pt supine in bed with bed alarm activated, 2 bed rails up, call light within reach and 4Ps assessed. OT issued heat packs for bilateral shoulders in order to decrease pain.    Therapy/Group: Individual Therapy  Velia Meyer, OTD, OTR/L 09/22/2023, 12:18 PM

## 2023-09-22 NOTE — Progress Notes (Signed)
PROGRESS NOTE   Subjective/Complaints:  Pt reports pain doing the same- on pain meds and taking q4 hours per nursing.  Things "all right"- per pt LBM x2 yesterday  ROS:    Pt denies SOB, abd pain, CP, N/V/C/D, and vision changes   Objective:   No results found. Recent Labs    09/21/23 0519  WBC 7.2  HGB 9.0*  HCT 29.2*  PLT 348    Recent Labs    09/21/23 0519  NA 137  K 5.4*  CL 109  CO2 19*  GLUCOSE 88  BUN 74*  CREATININE 4.57*  CALCIUM 8.7*     Intake/Output Summary (Last 24 hours) at 09/22/2023 0811 Last data filed at 09/22/2023 0731 Gross per 24 hour  Intake 1078 ml  Output 1500 ml  Net -422 ml        Physical Exam: Vital Signs Blood pressure (!) 146/72, pulse 70, temperature 98.4 F (36.9 C), temperature source Oral, resp. rate 18, height 5\' 9"  (1.753 m), weight 76.2 kg, SpO2 96%.    General: awake, alert, appropriate, sitting up in bed; NAD HENT: conjugate gaze; oropharynx moist CV: regular rate and rhythm; no JVD Pulmonary: CTA B/L; no W/R/R- good air movement GI: soft, NT, ND, (+)BS Psychiatric: stable irritability Neurological: Ox3    MSK- R shoulder pain with ER/IR, k-tape in place,less pain with ROM  Skin: No evidence of breakdown, no evidence of rash   Neurologic: Cranial nerves II through XII intact, motor strength is 5/5 in bilateral deltoid, bicep, tricep, grip,3-/5 B  hip flexor, knee extensors,4-  ankle dorsiflexor and plantar flexor Sensory exam reduced LT below ankle on the right side and below supramalleolar area on left side  R>L hand intrinsic atrophy --no changes 11/17 Musculoskeletal: Full range of motion in all 4 extremities. No joint swelling   Assessment/Plan: 1. Functional deficits which require 3+ hours per day of interdisciplinary therapy in a comprehensive inpatient rehab setting. Physiatrist is providing close team supervision and 24 hour management  of active medical problems listed below. Physiatrist and rehab team continue to assess barriers to discharge/monitor patient progress toward functional and medical goals  Care Tool:  Bathing    Body parts bathed by patient: Right arm, Left arm, Chest, Abdomen, Front perineal area, Face   Body parts bathed by helper: Right upper leg, Left upper leg, Right lower leg, Left lower leg, Buttocks     Bathing assist Assist Level: Maximal Assistance - Patient 24 - 49%     Upper Body Dressing/Undressing Upper body dressing        Upper body assist Assist Level: Minimal Assistance - Patient > 75%    Lower Body Dressing/Undressing Lower body dressing      What is the patient wearing?: Underwear/pull up, Pants     Lower body assist Assist for lower body dressing: Maximal Assistance - Patient 25 - 49%     Toileting Toileting    Toileting assist Assist for toileting: Maximal Assistance - Patient 25 - 49%     Transfers Chair/bed transfer  Transfers assist     Chair/bed transfer assist level: Contact Guard/Touching assist     Locomotion Ambulation  Ambulation assist   Ambulation activity did not occur: Safety/medical concerns  Assist level: Contact Guard/Touching assist Assistive device: Walker-rolling Max distance: 150 ft   Walk 10 feet activity   Assist  Walk 10 feet activity did not occur: Safety/medical concerns  Assist level: Contact Guard/Touching assist Assistive device: Walker-rolling   Walk 50 feet activity   Assist Walk 50 feet with 2 turns activity did not occur: Safety/medical concerns  Assist level: Contact Guard/Touching assist Assistive device: Walker-rolling    Walk 150 feet activity   Assist Walk 150 feet activity did not occur: Safety/medical concerns  Assist level: Contact Guard/Touching assist Assistive device: Walker-rolling    Walk 10 feet on uneven surface  activity   Assist Walk 10 feet on uneven surfaces activity did  not occur: Safety/medical concerns         Wheelchair     Assist Is the patient using a wheelchair?: Yes Type of Wheelchair: Manual    Wheelchair assist level: Supervision/Verbal cueing Max wheelchair distance: 250 ft    Wheelchair 50 feet with 2 turns activity    Assist        Assist Level: Supervision/Verbal cueing   Wheelchair 150 feet activity     Assist      Assist Level: Supervision/Verbal cueing   Blood pressure (!) 146/72, pulse 70, temperature 98.4 F (36.9 C), temperature source Oral, resp. rate 18, height 5\' 9"  (1.753 m), weight 76.2 kg, SpO2 96%.  Medical Problem List and Plan: 1. Functional deficits secondary to lumbar stenosis with radiculopathy s/p  L2-L5 DLIF with instrumentation 10/29 with Dr. Maisie Fus             -patient may shower             -ELOS/Goals: 10-14 days, Mod I PT/OT D/c 11/21  Con't ICR PT and OT  Team conference today to finalize d/c.   Will need f/u with [facility] as well as Ortho referral for R shoulder- will NOT need f/u with Dr Berline Chough Con't K taping of R shoulder 2.  Antithrombotics: -DVT/anticoagulation:  Pharmaceutical: Heparin             -antiplatelet therapy: none   3. Pain Management: Tylenol, Robaxin, oxycodone as needed             -Continue gabapentin 100 mg twice daily Cont oxy IR 10mg  q 4 prn, if somnolent consider change to hydromorphone given renal status    11/11- not lethargic- so will maintain dosing for now  11/12- having more pain due to soreness from therapies- will maintain regimen- has Robaxin as needed for muscle spasms  11/13- got Xray of R shoulder- shows signs of RTC- but pt also having concern about fx?-xray results suggested a CT- but computer wanted me to get MRI- R shoulder MRI ordered- will add Voltaren gel 2g QID to try and help inflammation.   11/14- pain doing MUCH better- con't regimen- Ortho referral when discharged  11/15- pain much better- now intermittent in R shoulder- still having leg  pain- sounds like from therapy  11/16-17 pt reports that he's in ongoing pain. However he's not using oxycodone but 3 x per day over last 2 days. Will hold off any changes today as pain appears generally improved.  11/18- Pain stable on Oxy- con't regimen  11/19- taking Oxy q4 hours- advised to try and take ~q5 hours 4. Mood/Behavior/Sleep: LCSW to evaluate and provide emotional support             -  antipsychotic agents: n/a   5. Neuropsych/cognition: This patient is capable of making decisions on his own behalf.   6. Skin/Wound Care: Routine skin care checks              - R toe sutures removed on admission, WOC order for betadyne BID   7. Fluids/Electrolytes/Nutrition: Strict Is and Os and follow-up chemistries   8: Hypertension: monitor TID and prn; appears stable on oral agents             -continue amlodipine 10 mg daily             -continue carvedilol 3.125 mg twice daily Vitals:   09/21/23 2007 09/22/23 0441  BP: 132/66 (!) 146/72  Pulse: 75 70  Resp: 17 18  Temp: 99.2 F (37.3 C) 98.4 F (36.9 C)  SpO2: 96% 96%  Controlled 11/9  11/11- BP controlled in last 24 hours, but was real high yesterday early AM- however nothing like that since- not sure if was correct? Not clear   11/16 bp controlled--no changes  11/19- BP controlled- con't regimen 9: Diabetes mellitus: CBGs 4 times daily; A1c = 5.7%             -continue sliding scale insulin 0 to 15 units             -? resume home Lantus 5 units daily   CBG (last 3)  Recent Labs    09/21/23 1653 09/21/23 2106 09/22/23 0613  GLUCAP 134* 128* 92   Controlled 11/10  11/11- CBGs controlled- look good-c on't regimen  11/12- not on Home dose Lantus, but CBGs look great- will not rstart right now  11/6  controlled no changes  11/18- Cbgs look great- no home Lantus  1119- NO change in Cbgs= looking good- con't regimen 10: Urinary retention, acute, h/o BPH:              -had indwelling Foley catheter -continue Flomax 0.4  mg daily  -Foley placed 10/30; DC foley trial with ISC Q6H for PVR >350 ccs to start in AM 11/9   11/11- foley out-= last PVR 108cc- will monitor with bladder scans  11/15- peeing well- scans look OK- running 0-150s- on Flomax- 0.4 mg daily 11: AKI/CKD stage IV: Nearing need for hemodialysis             -Nephrology signed off, he has had LUE AV fistula created 11/7 (follow-up arranged with [facility])- pt will f/u with Dr Wolfgang Phoenix as OP              -On Calcitrol, PhosLo    11/11- looking slightly better this AM  11/18- Calcium 8.7 12: 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 Hx IBS but no further diarrhea   11/12- LBM last night  11/14- changed Colace to prn- and miralax is prn- pt had 2 bowel accidents from meds- doesn't want anymore  11/15- small BM yesterday- otherwise 2 days ago  11/18- LBM yesterday- loose  11/19- LBM yesterday x2 16: Right  4th toe laceration PTA: well-healed, discontinue sutures.    17. ? R biceps tear - documented per record in 2022 by Dr. Hilda Lias at [place]. Likely lost to follow up; would re-establish as OP.   11/15- will send referral to [facility] at d/c- since has severe multiple muscle RTC tear of R shoulder-    11/17 continue k-tape, pain mgt as above 18. Hx cervical stenosis s/p C4-T1 PCDF 2022, then C4/5 ACDF 11/2022 with Dr. Maisie Fus. Pain regimen as above.     19.  Mild hyperK+ due to CKD    11/11- K+ down to 5.0  11/17 f/u labs Monday  11/18- K+ 5.4- will give Lokelma- 10 G x1  11!9- will recheck K+ tomorrow    I spent a total of 36   minutes on total care today- >50% coordination of care- due to  D/w  nursing about pain meds usage- also review of labs- and will get BMP tomorrow- since K_ was 5.4 yesterday- was given [facility]- also team conference today to finalize d/c   LOS: 11 days A FACE TO FACE EVALUATION WAS PERFORMED  Yanitza Shvartsman 09/22/2023, 8:11 AM

## 2023-09-22 NOTE — Progress Notes (Signed)
RN assessed and cleansed right flank incision site per order. No sutures present, incision site left OTA

## 2023-09-22 NOTE — Progress Notes (Signed)
Occupational Therapy Session Note  Patient Details  Name: Curtis Hartman MRN: [id number] Date of Birth: [birth year]  Today's Date: 09/22/2023 OT Individual Time: 1335-1415 OT Individual Time Calculation (min): 40 min    Short Term Goals: Week 2:  OT Short Term Goal 1 (Week 2): STG=LTG d/t ELOS  Skilled Therapeutic Interventions/Progress Updates:  Pt greeted resting in bed for skilled OT session with focus on B FMC.   Pain: Pt reported 10/10 generalized pain, medicated during session. OT offering intermediate rest breaks and positioning suggestions throughout session to address pain/fatigue and maximize participation/safety in session.   Functional Transfers: Pt performs bed mobility with supervision + use of log roll technique.   Therapeutic Exercise: Pt defers OOB/out of room activity due to high levels of pain. Activities held at EOB. Pt instructed [facility] theraputty HEP with use of tan colored putty. Pt demos decreased dexterity in R-hand>L-hand, particularly having difficulty with 4th & 5th digital flexion/extension.   Pt remained resting in bed with 4Ps assessed and immediate needs met. Pt continues to be appropriate for skilled OT intervention to promote further functional independence in ADLs/IADLs.   Therapy Documentation Precautions:  Precautions Precautions: Back, Fall Precaution Comments: decreased sensation in RLE Required Braces or Orthoses: Spinal Brace Spinal Brace: Lumbar corset, Applied in sitting position Restrictions Weight Bearing Restrictions: No Other Position/Activity Restrictions: Pt required assist to don brace.  Therapy/Group: Individual Therapy  Lou Cal, OTR/L, MSOT  09/22/2023, 6:19 AM

## 2023-09-23 LAB — GLUCOSE, CAPILLARY
Glucose-Capillary: 81 mg/dL (ref 70–99)
Glucose-Capillary: 76 mg/dL (ref 70–99)
Glucose-Capillary: 130 mg/dL — ABNORMAL HIGH (ref 70–99)
Glucose-Capillary: 125 mg/dL — ABNORMAL HIGH (ref 70–99)

## 2023-09-23 LAB — BASIC METABOLIC PANEL
Anion gap: 8 (ref 5–15)
BUN: 69 mg/dL — ABNORMAL HIGH (ref 8–23)
CO2: 21 mmol/L — ABNORMAL LOW (ref 22–32)
Calcium: 8.9 mg/dL (ref 8.9–10.3)
Chloride: 110 mmol/L (ref 98–111)
Creatinine, Ser: 4.81 mg/dL — ABNORMAL HIGH (ref 0.61–1.24)
GFR, Estimated: 13 mL/min — ABNORMAL LOW (ref >60)
Glucose, Bld: 82 mg/dL (ref 70–99)
Potassium: 5.3 mmol/L — ABNORMAL HIGH (ref 3.5–5.1)
Sodium: 139 mmol/L (ref 135–145)

## 2023-09-23 MED ORDER — SODIUM ZIRCONIUM CYCLOSILICATE 10 G PO PACK
10.0000 g | PACK | Freq: Once | ORAL | Status: AC
  Administered 2023-09-23: 10 g via ORAL
  Filled 2023-09-23: qty 1

## 2023-09-23 NOTE — Progress Notes (Signed)
PROGRESS NOTE   Subjective/Complaints:   Pt reports no changes- is "So-so" this AM.   LBM - small BM yesterday- mainly gas   Ate 100% of tray   ROS:    Pt denies SOB, abd pain, CP, N/V/C/D, and vision changes   Objective:   No results found. Recent Labs    09/21/23 0519  WBC 7.2  HGB 9.0*  HCT 29.2*  PLT 348    Recent Labs    09/21/23 0519 09/23/23 0618  NA 137 139  K 5.4* 5.3*  CL 109 110  CO2 19* 21*  GLUCOSE 88 82  BUN 74* 69*  CREATININE 4.57* 4.81*  CALCIUM 8.7* 8.9     Intake/Output Summary (Last 24 hours) at 09/23/2023 0852 Last data filed at 09/23/2023 0700 Gross per 24 hour  Intake 598 ml  Output 1050 ml  Net -452 ml        Physical Exam: Vital Signs Blood pressure (!) 151/88, pulse 69, temperature 98 F (36.7 C), temperature source Oral, resp. rate 18, height 5\' 9"  (1.753 m), weight 75.1 kg, SpO2 97%.     General: awake, alert, appropriate, sititng up in bed- ate 100%;  NAD HENT: conjugate gaze; oropharynx moist CV: regular rate and rhythm; no JVD Pulmonary: CTA B/L; no W/R/R- good air movement GI: soft, NT, ND, (+)BS Psychiatric: appropriate- irritable Neurological: Ox3    MSK- R shoulder pain with ER/IR, k-tape in place,less pain with ROM  Skin: No evidence of breakdown, no evidence of rash   Neurologic: Cranial nerves II through XII intact, motor strength is 5/5 in bilateral deltoid, bicep, tricep, grip,3-/5 B  hip flexor, knee extensors,4-  ankle dorsiflexor and plantar flexor Sensory exam reduced LT below ankle on the right side and below supramalleolar area on left side  R>L hand intrinsic atrophy --no changes 11/17 Musculoskeletal: Full range of motion in all 4 extremities. No joint swelling   Assessment/Plan: 1. Functional deficits which require 3+ hours per day of interdisciplinary therapy in a comprehensive inpatient rehab setting. Physiatrist is providing  close team supervision and 24 hour management of active medical problems listed below. Physiatrist and rehab team continue to assess barriers to discharge/monitor patient progress toward functional and medical goals  Care Tool:  Bathing    Body parts bathed by patient: Right arm, Left arm, Chest, Abdomen, Front perineal area, Face   Body parts bathed by helper: Right upper leg, Left upper leg, Right lower leg, Left lower leg, Buttocks     Bathing assist Assist Level: Maximal Assistance - Patient 24 - 49%     Upper Body Dressing/Undressing Upper body dressing        Upper body assist Assist Level: Minimal Assistance - Patient > 75%    Lower Body Dressing/Undressing Lower body dressing      What is the patient wearing?: Underwear/pull up, Pants     Lower body assist Assist for lower body dressing: Maximal Assistance - Patient 25 - 49%     Toileting Toileting    Toileting assist Assist for toileting: Maximal Assistance - Patient 25 - 49%     Transfers Chair/bed transfer  Transfers assist  Chair/bed transfer assist level: Supervision/Verbal cueing     Locomotion Ambulation   Ambulation assist   Ambulation activity did not occur: Safety/medical concerns  Assist level: Supervision/Verbal cueing Assistive device: Walker-rolling Max distance: 150 ft   Walk 10 feet activity   Assist  Walk 10 feet activity did not occur: Safety/medical concerns  Assist level: Supervision/Verbal cueing Assistive device: Walker-rolling   Walk 50 feet activity   Assist Walk 50 feet with 2 turns activity did not occur: Safety/medical concerns  Assist level: Supervision/Verbal cueing Assistive device: Walker-rolling    Walk 150 feet activity   Assist Walk 150 feet activity did not occur: Safety/medical concerns  Assist level: Supervision/Verbal cueing Assistive device: Walker-rolling    Walk 10 feet on uneven surface  activity   Assist Walk 10 feet on  uneven surfaces activity did not occur: Safety/medical concerns   Assist level: Supervision/Verbal cueing Assistive device: Walker-rolling   Wheelchair     Assist Is the patient using a wheelchair?: No Type of Wheelchair: Manual    Wheelchair assist level: Supervision/Verbal cueing Max wheelchair distance: 250 ft    Wheelchair 50 feet with 2 turns activity    Assist        Assist Level: Supervision/Verbal cueing   Wheelchair 150 feet activity     Assist      Assist Level: Supervision/Verbal cueing   Blood pressure (!) 151/88, pulse 69, temperature 98 F (36.7 C), temperature source Oral, resp. rate 18, height 5\' 9"  (1.753 m), weight 75.1 kg, SpO2 97%.  Medical Problem List and Plan: 1. Functional deficits secondary to lumbar stenosis with radiculopathy s/p  L2-L5 DLIF with instrumentation 10/29 with Dr. Maisie Fus             -patient may shower             -ELOS/Goals: 10-14 days, Mod I PT/OT D/c 11/21  Con't CIR PT and OT D/c tomorrow  Will need f/u with [facility] as well as Ortho referral for R shoulder- will NOT need f/u with Dr Berline Chough Con't K taping of R shoulder 2.  Antithrombotics: -DVT/anticoagulation:  Pharmaceutical: Heparin             -antiplatelet therapy: none   3. Pain Management: Tylenol, Robaxin, oxycodone as needed             -Continue gabapentin 100 mg twice daily Cont oxy IR 10mg  q 4 prn, if somnolent consider change to hydromorphone given renal status    11/11- not lethargic- so will maintain dosing for now  11/12- having more pain due to soreness from therapies- will maintain regimen- has Robaxin as needed for muscle spasms  11/13- got Xray of R shoulder- shows signs of RTC- but pt also having concern about fx?-xray results suggested a CT- but computer wanted me to get MRI- R shoulder MRI ordered- will add Voltaren gel 2g QID to try and help inflammation.   11/14- pain doing MUCH better- con't regimen- Ortho referral when discharged  11/15-  pain much better- now intermittent in R shoulder- still having leg pain- sounds like from therapy  11/16-17 pt reports that he's in ongoing pain. However he's not using oxycodone but 3 x per day over last 2 days. Will hold off any changes today as pain appears generally improved.  11/18- Pain stable on Oxy- con't regimen  11/19- taking Oxy q4 hours- advised to try and take ~q5 hours  11/20- will get pain meds after 7 days from [facility] 4. Mood/Behavior/Sleep: LCSW  to evaluate and provide emotional support             -antipsychotic agents: n/a   5. Neuropsych/cognition: This patient is capable of making decisions on his own behalf.   6. Skin/Wound Care: Routine skin care checks              - R toe sutures removed on admission, WOC order for betadyne BID   7. Fluids/Electrolytes/Nutrition: Strict Is and Os and follow-up chemistries   8: Hypertension: monitor TID and prn; appears stable on oral agents             -continue amlodipine 10 mg daily             -continue carvedilol 3.125 mg twice daily Vitals:   09/23/23 0614 09/23/23 0806  BP: (!) 151/88   Pulse: 67 69  Resp: 18 18  Temp: 98 F (36.7 C)   SpO2: 98% 97%  Controlled 11/9  11/11- BP controlled in last 24 hours, but was real high yesterday early AM- however nothing like that since- not sure if was correct? Not clear   11/16 bp controlled--no changes  11/19- BP controlled- con't regimen 9: Diabetes mellitus: CBGs 4 times daily; A1c = 5.7%             -continue sliding scale insulin 0 to 15 units             -? resume home Lantus 5 units daily   CBG (last 3)  Recent Labs    09/22/23 1619 09/22/23 2049 09/23/23 0612  GLUCAP 127* 99 81   Controlled 11/10  11/11- CBGs controlled- look good-c on't regimen  11/12- not on Home dose Lantus, but CBGs look great- will not rstart right now  11/6  controlled no changes  11/18- Cbgs look great- no home Lantus  1119-11/20- NO change in Cbgs= looking good- con't regimen 10: Urinary  retention, acute, h/o BPH:              -had indwelling Foley catheter -continue Flomax 0.4 mg daily  -Foley placed 10/30; DC foley trial with ISC Q6H for PVR >350 ccs to start in AM 11/9   11/11- foley out-= last PVR 108cc- will monitor with bladder scans  11/15- peeing well- scans look OK- running 0-150s- on Flomax- 0.4 mg daily 11: AKI/CKD stage IV: Nearing need for hemodialysis             -Nephrology signed off, he has had LUE AV fistula created 11/7 (follow-up arranged with [facility])- pt will f/u with Dr Wolfgang Phoenix as OP              -On Calcitrol, PhosLo    11/11- looking slightly better this AM  11/18- Calcium 8.7 12: 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 Hx IBS but no further diarrhea   11/12- LBM last night  11/14- changed Colace to prn- and miralax is prn- pt had 2 bowel accidents from meds- doesn't want anymore  11/15- small BM yesterday- otherwise  2 days ago  11/18- LBM yesterday- loose  11/19- LBM yesterday x2 16: Right 4th toe laceration PTA: well-healed, discontinue sutures.    17. ? R biceps tear - documented per record in 2022 by Dr. Hilda Lias at [facility]. Likely lost to follow up; would re-establish as OP.   11/15- will send referral to [facility] at d/c- since has severe multiple muscle RTC tear of R shoulder-    11/17 continue k-tape, pain mgt as above 18. Hx cervical stenosis s/p C4-T1 PCDF 2022, then C4/5 ACDF 11/2022 with Dr. Maisie Fus. Pain regimen as above.     19.  Mild hyperK+ due to CKD    11/11- K+ down to 5.0  11/17 f/u labs Monday  11/18- K+ 5.4- will give Lokelma- 10 G x1  11!9- will recheck K+  tomorrow  1120- K+ down to 5.3- will give another dose of Lokelma and check with pharmacy if any meds increasing potassium   I spent a total of 35   minutes on total care today- >50% coordination of care- due to  Calling pharmacy and seeing if meds causing increase in K+- will give Lokelma and recheck in AM  LOS: 12 days A FACE TO FACE EVALUATION WAS PERFORMED  Geniece Akers 09/23/2023, 8:52 AM

## 2023-09-23 NOTE — Progress Notes (Signed)
Physical Therapy Discharge Summary  Patient Details  Name: Curtis Hartman MRN: [id number] Date of Birth: 1961-03-05  Date of Discharge from PT service:September 23, 2023  Today's Date: 09/23/2023 PT Individual Time: 9518-8416, [id number]   PT Individual Time Calculation (min): 75 min , 57 min     Patient has met 10 of 10 long term goals due to improved activity tolerance, improved balance, improved postural control, increased strength, increased range of motion, decreased pain, and ability to compensate for deficits.  Patient to discharge at an ambulatory level Supervision.   Reasons goals not met: N/A   Recommendation:  Patient will benefit from ongoing skilled PT services in outpatient setting to continue to advance safe functional mobility, address ongoing impairments in balance, coordination, range of motion, and minimize fall risk.  Equipment: Agricultural consultant   Reasons for discharge: treatment goals met and discharge from hospital  Patient/family agrees with progress made and goals achieved: Yes  PT Discharge Pain Interference Pain Interference Pain Effect on Sleep: 3. Frequently Pain Interference with Therapy Activities: 2. Occasionally Pain Interference with Day-to-Day Activities: 1. Rarely or not at all Cognition Overall Cognitive Status: Within Functional Limits for tasks assessed Arousal/Alertness: Awake/alert Orientation Level: Oriented X4 Memory: Appears intact Awareness: Appears intact Problem Solving: Appears intact Safety/Judgment: Other (comment) Comments: difficulty adhering to spine precuations with bed mobility Sensation Sensation Light Touch: Impaired by gross assessment Light Touch Impaired Details: Impaired RLE Hot/Cold: Appears Intact Proprioception: Appears Intact Stereognosis: Appears Intact Coordination Gross Motor Movements are Fluid and Coordinated: Yes Fine Motor Movements are Fluid and Coordinated: No Coordination and Movement  Description: slight coordination deficit from prior injury in BUE Heel Shin Test: unable d/t pain R LE Motor  Motor Motor: Within Functional Limits Motor - Skilled Clinical Observations: WFL Motor - Discharge Observations: WFL for D/C, sister will be there to provide supervision  Mobility Bed Mobility Bed Mobility: Rolling Right;Rolling Left;Supine to Sit;Sit to Supine Rolling Right: Supervision/verbal cueing Rolling Left: Supervision/Verbal cueing Right Sidelying to Sit: Supervision/Verbal cueing Left Sidelying to Sit: Supervision/Verbal cueing Supine to Sit: Supervision/Verbal cueing Sit to Supine: Supervision/Verbal cueing Transfers Transfers: Sit to Stand;Stand Pivot Transfers;Stand to Sit Sit to Stand: Supervision/Verbal cueing Stand Pivot Transfers: Supervision/Verbal cueing Stand Pivot Transfer Details: Verbal cues for precautions/safety;Verbal cues for safe use of DME/AE Transfer (Assistive device): Rolling walker Locomotion  Gait Ambulation: Yes Gait Assistance: Supervision/Verbal cueing Gait Distance (Feet): 150 Feet Assistive device: Rolling walker Gait Assistance Details: Verbal cues for safe use of DME/AE Gait Gait: Yes Gait Pattern: Antalgic Gait velocity: decreased Stairs / Additional Locomotion Stairs: Yes Stairs Assistance: Contact Guard/Touching assist Stair Management Technique: With walker Number of Stairs: 1 Height of Stairs: 3 (inches) Ramp: Supervision/Verbal cueing Curb: Contact Guard/Touching assist Pick up small object from the floor assist level: Supervision/Verbal cueing Pick up small object from the floor assistive device: Chief Operating Officer Mobility: Yes Wheelchair Assistance: Doctor, general practice: Both upper extremities Wheelchair Parts Management: Needs assistance Distance: 250 ft  Trunk/Postural Assessment  Cervical Assessment Cervical Assessment: Exceptions to [facility] (s/p cervical surgery;  forward head) Thoracic Assessment Thoracic Assessment: Exceptions to [facility] (rounded shoulders) Lumbar Assessment Lumbar Assessment: Exceptions to [facility] (posterior pelvic tilt) Postural Control Postural Control: Within Functional Limits  Balance Balance Balance Assessed: Yes Static Sitting Balance Static Sitting - Balance Support: Feet supported;Bilateral upper extremity supported Static Sitting - Level of Assistance: 6: Modified independent (Device/Increase time) Dynamic Sitting Balance Dynamic Sitting - Balance Support: Feet supported;Bilateral upper extremity supported Dynamic Sitting -  Level of Assistance: 6: Modified independent (Device/Increase time) Static Standing Balance Static Standing - Balance Support: During functional activity;Bilateral upper extremity supported Static Standing - Level of Assistance: 5: Stand by assistance (supervision) Dynamic Standing Balance Dynamic Standing - Balance Support: Bilateral upper extremity supported;During functional activity Dynamic Standing - Level of Assistance: 5: Stand by assistance (supervision) Extremity Assessment  RUE Assessment RUE Assessment: Within Functional Limits Active Range of Motion (AROM) Comments: shoulder flexion ~100*, all other joints WNL General Strength Comments: limited d/t general shoulder pain, overall 3+/5, able to complete tasks as necessary without limitations LUE Assessment LUE Assessment: Exceptions to [facility] Active Range of Motion (AROM) Comments: 0-45 (hx of L RTC) General Strength Comments: 3/5 sh, all other joints 4+/5 RLE Assessment RLE Assessment: Within Functional Limits RLE Strength Right Hip Flexion: 3+/5 Right Hip Extension: 3+/5 Right Hip ABduction: 4-/5 Right Hip ADduction: 4-/5 Right Knee Flexion: 3+/5 Right Ankle Dorsiflexion: 3+/5 Right Ankle Plantar Flexion: 3+/5 LLE Assessment LLE Assessment: Within Functional Limits LLE Strength Left Hip Flexion: 4+/5 Left Hip Extension: 4+/5 Left  Hip ABduction: 4+/5 Left Hip ADduction: 4+/5 Left Knee Flexion: 4+/5 Left Knee Extension: 4+/5 Left Ankle Dorsiflexion: 4+/5 Left Ankle Plantar Flexion: 4+/5  Treatment Session 1:   Pt agreeable to PT session and supervision with all mobility including (rolling, supine<>sit, transfers with rollator and RW, and gait x 150 ft x 2). Pt supervision for toileting and continent. Pt supervision for upper and lower body dressing with adaptive equipment sock aide and reacher.   Pt performed following exercises to address flexibility and ROM deficits:   -assisted active hamstring flexiblity with scooter board bilaterally x 15   -CCW shoulder mobility with physio ball bilaterally x 10   -limited range reverse crunches x 10   Pt left seated in recliner at bedside with all needs in reach and alarm on.   Treatment Session 2:   Pt received semi-reclined in bed and requested treatment session in room, therapist agreed. Pt reports generalized muscle soreness, provided rest and repositioning for relief.   Pt performed following exercises to address strength and activity tolerance deficits:   -hooklying resisted red theraband clam shells   -SAQ bilaterally x 15  -hip abduction 3 x 3 bilaterally   PT printed and reviewed HEP with patient and answered questions. PT also discussed patient education binder regarding spinal cord injury specific considerations following surgery along with energy conservation techniques and patient advocacy.   Pt missed 18 minutes of skilled PT 2/2 pain and fatigue and left semi-reclined in bed with all needs in reach and alarm on.     Truitt Leep Truitt Leep PT, DPT  09/23/2023, 9:02 AM

## 2023-09-23 NOTE — Progress Notes (Signed)
Inpatient Rehabilitation Care Coordinator Discharge Note   Patient Details  Name: Curtis Hartman MRN: [id number] Date of Birth: Jul 03, 1961   Discharge location: HOME WITH SISTER AND SISTER'S BOYFRIEND-24/7 SUPERVISION  Length of Stay: 14 DAYS  Discharge activity level: SUPERVISION LEVEL  Home/community participation: ACTIVE  Patient response NW:[id number] Literacy - How often do you need to have someone help you when you read instructions, pamphlets, or other written material from your doctor or pharmacy?: Never  Patient response HY:[id number] Isolation - How often do you feel lonely or isolated from those around you?: Never  Services provided included: MD, RD, PT, OT, RN, CM, TR, Pharmacy, Neuropsych, SW  Financial Services:  Financial Services Utilized: Medicaid    Choices offered to/list presented to: PT  Follow-up services arranged:  Outpatient, DME, Patient/Family has no preference for HH/DME agencies    Outpatient Servicies: [facility]-PT WILL CALL HIM TO SET UP FOLLOW UP PT APPOINTMENT DME : [facility]  HAS OTHER NEEDED EQUIPMENT FROM PAST ADMISSIONS  Patient response to transportation need: Is the patient able to respond to transportation needs?: Yes In the past 12 months, has lack of transportation kept you from medical appointments or from getting medications?: No In the past 12 months, has lack of transportation kept you from meetings, work, or from getting things needed for daily living?: No   Patient/Family verbalized understanding of follow-up arrangements:  Yes  Individual responsible for coordination of the follow-up plan: SELF AND SANDRA-SISTER [phone]  Confirmed correct DME delivered: Lucy Chris 09/23/2023    Comments (or additional information):PT DID WELL BUT DEVELOPED SHOULDER ISSUES AND WAS REFERRED TO ORTHOPEDIC MD AS AN OP. MET GOALS AND SISTER CAN ASSIST AT HOME  Summary of Stay    Date/Time Discharge Planning CSW   09/22/23 1013 Progressing in his therapies and doing better. OPPT referral made back  to [facility] was there before. RGD  09/15/23 0858 Home with younger sister and her boyfriend she is there if needed to assist him. he has done OP in [place] after neck surgeries. RGD       Lucy Chris

## 2023-09-23 NOTE — Progress Notes (Incomplete)
Inpatient Rehabilitation Discharge Medication Review by a Pharmacist   A complete drug regimen review was completed for this patient to identify any potential clinically significant medication issues.   High Risk Drug Classes Is patient taking? Indication by Medication  Antipsychotic No    Anticoagulant No   Antibiotic No    Opioid Yes Oxycodone - pain  Antiplatelet No    Hypoglycemics/insulin Yes Novolog - DM  Vasoactive Medication Yes Norvasc, Coreg, hydralazine - HTN  Chemotherapy No    Other Yes Tylenol - pain  Albuterol - asthma  Calcitriol - hypoparathyroidism Phoslo - phosphate binder Arenesp - anemia  Breo ellipta - COPD Incruse ellipta - COPD Robaxin - muscle relaxer Zofran - nausea/ vomiting  Miralax - constipation Flomax - BPH Benadryl - itching Melatonin - sleep        Type of Medication Issue Identified Description of Issue Recommendation(s)  Drug Interaction(s) (clinically significant)        Duplicate Therapy        Allergy        No Medication Administration End Date        Incorrect Dose        Additional Drug Therapy Needed        Significant med changes from prior encounter (inform family/care partners about these prior to discharge). Breztri was his pta COPD inhaler, benicar PTA These meds were changed due to formulary. Evaluate the need to restart at [facility] discharge.  Other            Clinically significant medication issues were identified that warrant physician communication and completion of prescribed/recommended actions by midnight of the next day:  No   Name of provider notified for urgent issues identified:    Provider Method of Notification:      Pharmacist comments:    Time spent performing this drug regimen review (minutes):  20 minutes  Thank you for involving pharmacy in this patient's care.  Loura Back, PharmD, BCPS Clinical Pharmacist Clinical phone for 09/23/2023 is [phone] 09/23/2023 1:52 PM

## 2023-09-23 NOTE — Progress Notes (Signed)
Occupational Therapy Discharge Summary  Patient Details  Name: Curtis Hartman MRN: [id number] Date of Birth: 11-Jun-1961  Date of Discharge from OT service:September 23, 2023  Today's Date: 09/23/2023 OT Individual Time: 1478-2956 OT Individual Time Calculation (min): 60 min    Patient has met 11 of 11 long term goals due to improved activity tolerance, improved balance, postural control, ability to compensate for deficits, functional use of  RIGHT upper, RIGHT lower, LEFT upper, and LEFT lower extremity, improved awareness, and improved coordination.  Patient to discharge at overall Supervision-Mod I level.  Patient's care partner is independent to provide the necessary supervision assistance at discharge.    Reasons goals not met: All goals met.  Recommendation:  Patient will benefit from ongoing skilled OT services in home health setting to continue to advance functional skills in the area of BADL, iADL, and Reduce care partner burden.  Equipment: TTB  Reasons for discharge: treatment goals met and discharge from hospital  Patient/family agrees with progress made and goals achieved: Yes  OT Discharge Precautions/Restrictions  Precautions Precautions: Back;Fall Required Braces or Orthoses: Spinal Brace Spinal Brace: Applied in sitting position;Thoracolumbosacral orthotic Restrictions Weight Bearing Restrictions: No ADL ADL Equipment Provided: Reacher, Sock aid, Long-handled shoe horn, Long-handled sponge Eating: Modified independent Where Assessed-Eating: Edge of bed Grooming: Modified independent Where Assessed-Grooming: Sitting at sink Upper Body Bathing: Supervision/safety Where Assessed-Upper Body Bathing: Shower Lower Body Bathing: Supervision/safety Where Assessed-Lower Body Bathing: Shower Upper Body Dressing: Modified independent (Device) Where Assessed-Upper Body Dressing: Wheelchair Lower Body Dressing: Supervision/safety Where Assessed-Lower Body Dressing:  Standing at sink, Sitting at sink Toileting: Supervision/safety Where Assessed-Toileting: Teacher, adult education: Close supervision Toilet Transfer Method: Proofreader: Gaffer: Close supervison Web designer Method: Ship broker: Emergency planning/management officer, Acupuncturist: Close supervision Film/video editor Method: Designer, industrial/product: Emergency planning/management officer, Grab bars ADL Comments: max A LB, min A UB, min-mod A rollator transfers, max A TLSO donning Vision Baseline Vision/History: 1 Wears glasses Patient Visual Report: No change from baseline Vision Assessment?: No apparent visual deficits Perception  Perception: Within Functional Limits Praxis Praxis: WFL Cognition Cognition Overall Cognitive Status: Within Functional Limits for tasks assessed Arousal/Alertness: Awake/alert Orientation Level: Person;Place;Situation Person: Oriented Place: Oriented Situation: Oriented Memory: Appears intact Awareness: Appears intact Problem Solving: Appears intact Safety/Judgment: Other (comment) Comments: difficulty adhering to spine precuations with bed mobility Brief Interview for Mental Status (BIMS) Repetition of Three Words (First Attempt): 3 Temporal Orientation: Year: Correct Temporal Orientation: Month: Accurate within 5 days Temporal Orientation: Day: Correct Recall: "Sock": Yes, no cue required Recall: "Blue": Yes, no cue required Recall: "Bed": Yes, no cue required BIMS Summary Score: 15 Sensation Sensation Light Touch: Impaired by gross assessment Light Touch Impaired Details: Impaired RLE Hot/Cold: Appears Intact Proprioception: Appears Intact Stereognosis: Appears Intact Coordination Gross Motor Movements are Fluid and Coordinated: No Fine Motor Movements are Fluid and Coordinated: No Coordination and Movement Description: slight coordination deficit from  prior injury in BUE Motor  Motor Motor: Within Functional Limits Motor - Discharge Observations: WFL for D/C, sister will be there to provide supervision Mobility  Bed Mobility Bed Mobility: Rolling Right;Rolling Left;Left Sidelying to Sit;Supine to Sit;Sit to Supine;Right Sidelying to Sit Rolling Right: Independent with assistive device Rolling Left: Independent with assistive device Right Sidelying to Sit: Independent with assistive device Left Sidelying to Sit: Independent with assistive device Supine to Sit: Independent with assistive device Sit to Supine: Independent with assistive device Transfers Sit  to Stand: Supervision/Verbal cueing  Trunk/Postural Assessment  Cervical Assessment Cervical Assessment: Exceptions to [facility] (prior cervical spinal surgery) Thoracic Assessment Thoracic Assessment: Exceptions to [facility] (rounded shoulders) Lumbar Assessment Lumbar Assessment: Exceptions to [facility] (posterior pelvic tilt) Postural Control Postural Control: Within Functional Limits  Balance Balance Balance Assessed: Yes Static Sitting Balance Static Sitting - Balance Support: Feet supported;Bilateral upper extremity supported Static Sitting - Level of Assistance: 6: Modified independent (Device/Increase time) Dynamic Sitting Balance Dynamic Sitting - Balance Support: Feet supported;Bilateral upper extremity supported Dynamic Sitting - Level of Assistance: 6: Modified independent (Device/Increase time) Static Standing Balance Static Standing - Balance Support: During functional activity Static Standing - Level of Assistance: 5: Stand by assistance Dynamic Standing Balance Dynamic Standing - Balance Support: Bilateral upper extremity supported;During functional activity Dynamic Standing - Level of Assistance: 5: Stand by assistance Extremity/Trunk Assessment RUE Assessment RUE Assessment: Within Functional Limits Active Range of Motion (AROM) Comments: shoulder flexion ~100*, all  other joints WNL General Strength Comments: limited d/t general shoulder pain, overall 3+/5, able to complete tasks as necessary without limitations LUE Assessment LUE Assessment: Exceptions to [facility] Active Range of Motion (AROM) Comments: 0-45 (hx of L RTC) General Strength Comments: 3/5 sh, all other joints 4+/5 Tx Session General: "The shower always feels nice." Pt supine in bed upon OT arrival, agreeable to OT session.  Pain:  8/10 pain reported in bilateral shoulders and low back, activity, intermittent rest breaks, distractions provided for pain management, pt reports tolerable to proceed.    ADL: Bed mobility: Mod I supine><EOB Grooming: mod I seated at sink in W/C, able to manipulate containers Oral hygiene:  mod I seated at sink in W/C, able to manipulate containers Toilet transfer: supervision with RW ambulating from bed>standard toilet Toileting: supervision with RW to manage pants, able to complete hygiene after small SM and urine UB dressing: Mod I for overhead shirt donning and doffing as well as TLSO LB dressing: supervision using AE (reacher), seated to thread over feet, standing to manage over waist at [employer]: supervision with sock aide seated EOB Shower transfer: supervision with RW ambulating from toilet>TTB Bathing: supervision seated on TTB, able to wash/rinse/dry Transfers: supervision overall with RW for transfers and functional mobility, no LOB/SOB, occasional VC for RW safety   Other Treatments: OT re-testing 9HPT for D/C. Pt scored R=34 sec and L=33 sec which shows significant improvement from eval.   Pt supine in bed with bed alarm activated, 2 bed rails up, call light within reach and 4Ps assessed.   Velia Meyer, OTD, OTR/L 09/23/2023, 12:35 PM

## 2023-09-23 NOTE — Progress Notes (Signed)
Patient found in bathroom by himself by tech after turning off his bed alarm. Patient has done this before. Patient has been educated multiple times to stop turning off bed alarm and to notify staff when needing to ambulate use the restroom, etc. Education provided again.

## 2023-09-23 NOTE — Plan of Care (Signed)
Problem: RH Balance Goal: LTG: Patient will maintain dynamic sitting balance (OT) Description: LTG:  Patient will maintain dynamic sitting balance with assistance during activities of daily living (OT) Outcome: Completed/Met Flowsheets (Taken 09/12/2023 1538 by Vicenta Dunning, OT) LTG: Pt will maintain dynamic sitting balance during ADLs with: Independent with assistive device Goal: LTG Patient will maintain dynamic standing with ADLs (OT) Description: LTG:  Patient will maintain dynamic standing balance with assist during activities of daily living (OT)  Outcome: Completed/Met Flowsheets (Taken 09/12/2023 1538 by Vicenta Dunning, OT) LTG: Pt will maintain dynamic standing balance during ADLs with: Supervision/Verbal cueing   Problem: Sit to Stand Goal: LTG:  Patient will perform sit to stand in prep for activites of daily living with assistance level (OT) Description: LTG:  Patient will perform sit to stand in prep for activites of daily living with assistance level (OT) Outcome: Completed/Met Flowsheets (Taken 09/12/2023 1538 by Vicenta Dunning, OT) LTG: PT will perform sit to stand in prep for activites of daily living with assistance level: Supervision/Verbal cueing   Problem: RH Grooming Goal: LTG Patient will perform grooming w/assist,cues/equip (OT) Description: LTG: Patient will perform grooming with assist, with/without cues using equipment (OT) Outcome: Completed/Met Flowsheets (Taken 09/12/2023 1538 by Vicenta Dunning, OT) LTG: Pt will perform grooming with assistance level of: Independent with assistive device    Problem: RH Bathing Goal: LTG Patient will bathe all body parts with assist levels (OT) Description: LTG: Patient will bathe all body parts with assist levels (OT) Outcome: Completed/Met Flowsheets (Taken 09/12/2023 1538 by Vicenta Dunning, OT) LTG: Pt will perform bathing with assistance level/cueing: Supervision/Verbal cueing   Problem: RH Dressing Goal: LTG Patient  will perform upper body dressing (OT) Description: LTG Patient will perform upper body dressing with assist, with/without cues (OT). Outcome: Completed/Met Flowsheets (Taken 09/12/2023 1538 by Vicenta Dunning, OT) LTG: Pt will perform upper body dressing with assistance level of: Independent with assistive device Goal: LTG Patient will perform lower body dressing w/assist (OT) Description: LTG: Patient will perform lower body dressing with assist, with/without cues in positioning using equipment (OT) Outcome: Completed/Met Flowsheets (Taken 09/12/2023 1538 by Vicenta Dunning, OT) LTG: Pt will perform lower body dressing with assistance level of: Supervision/Verbal cueing   Problem: RH Toileting Goal: LTG Patient will perform toileting task (3/3 steps) with assistance level (OT) Description: LTG: Patient will perform toileting task (3/3 steps) with assistance level (OT)  Outcome: Completed/Met Flowsheets (Taken 09/12/2023 1538 by Vicenta Dunning, OT) LTG: Pt will perform toileting task (3/3 steps) with assistance level: Supervision/Verbal cueing   Problem: RH Functional Use of Upper Extremity Goal: LTG Patient will use RT/LT upper extremity as a (OT) Description: LTG: Patient will use right/left upper extremity as a stabilizer/gross assist/diminished/nondominant/dominant level with assist, with/without cues during functional activity (OT) Outcome: Completed/Met Flowsheets (Taken 09/12/2023 1538 by Vicenta Dunning, OT) LTG: Use of upper extremity in functional activities:  RUE as diminished level  LUE as diminished level LTG: Pt will use upper extremity in functional activity with assistance level of: Set up assist   Problem: RH Toilet Transfers Goal: LTG Patient will perform toilet transfers w/assist (OT) Description: LTG: Patient will perform toilet transfers with assist, with/without cues using equipment (OT) Outcome: Completed/Met Flowsheets (Taken 09/12/2023 1538 by Vicenta Dunning,  OT) LTG: Pt will perform toilet transfers with assistance level of: Supervision/Verbal cueing   Problem: RH Tub/Shower Transfers Goal: LTG Patient will perform tub/shower transfers w/assist (OT) Description: LTG: Patient  will perform tub/shower transfers with assist, with/without cues using equipment (OT) Outcome: Completed/Met Flowsheets (Taken 09/12/2023 1538 by Vicenta Dunning, OT) LTG: Pt will perform tub/shower stall transfers with assistance level of: Supervision/Verbal cueing

## 2023-09-23 NOTE — Patient Care Conference (Signed)
Inpatient RehabilitationTeam Conference and Plan of Care Update Date: 09/22/2023   Time:11:32 AM    Patient Name: Curtis Hartman      Medical Record Number: [id number]  Date of Birth: 05/25/1961 Sex: Male         Room/Bed: 4M07C/4M07C-01 Payor Info: Payor: [place] MEDICAID PREPAID HEALTH PLAN / Plan: [place] MEDICAID [facility] / Product Type: *No Product type* /    Admit Date/Time:  09/11/2023  1:37 PM  Primary Diagnosis:  Lumbar stenosis  Hospital Problems: Principal Problem:   Lumbar stenosis Active Problems:   Coping style affecting medical condition    Expected Discharge Date: Expected Discharge Date: 09/24/23  Team Members Present: Physician leading conference: Dr. Genice Rouge Social Worker Present: Dossie Der, LCSW Nurse Present: Vedia Pereyra, RN PT Present: Truitt Leep, PT OT Present: Velia Meyer, OT PPS Coordinator present : Fae Pippin, SLP     Current Status/Progress Goal Weekly Team Focus  Bowel/Bladder   Patient is continent of B&B   Remain continent.   Continue timed toileting to keep patient continent.    Swallow/Nutrition/ Hydration               ADL's   Bed mobility: SBA   Grooming: SBA  Oral hygiene: SBA  UB dressing: SBA LB dressing: SBA with AE  Footwear: SBA   Shower transfer: SBA with RW Bathing: SBA seated on TTB with long handled sponge  Transfers: SBA with RW   SBA overall   prepare for D/C    Mobility   supervision bed, CGA transfers with RW, CGA gait x 150 ft & curb step  (limited 2/2 pain)   supervision, CGA gait, min A steps  curb step and stair navigation    Communication                Safety/Cognition/ Behavioral Observations               Pain   Patient requieres frequent PRN intervention for pain.   To be pain free.   Promote alternatives to help aide in pain management along with administration of PRNs when needed.    Skin   Patient incision sites clean, dry, and intact.   Patient will remain infection  free.  Will changed dressings daily, or as needed, to keep paitnet infection free.      Discharge Planning:  Progressing in his therapies and doing better. OPPT referral made back  to [facility] was there before.   Team Discussion: Lumbar stenosis. LBM 09/20/23. Pain better controlled but still taking all pain medications scheduled and PRN.  Incision to back with attached edges.  Incision to right flank without drainage. Left arm has attached edges. Watching potassium. Tolerating carb-mod diet. AC/HS.  Daily weights.  Patient on target to meet rehab goals: yes, meeting goals with discharge 09/24/23  *See Care Plan and progress notes for long and short-term goals.   Revisions to Treatment Plan:  Lokelma as needed.  Monitor labs and VS Teaching Needs: Medications, safety, self care, gait/transfer training, applications of TLSO brace. Incision/wound care, etc.   Current Barriers to Discharge: Decreased caregiver support, Wound care, and Medication compliance  Possible Resolutions to Barriers: Family education Independent with skin/wound care Independent with TLSO brace don/doff Adhere to medication regimen Order recommended DME      Medical Summary Current Status: K+ 5.4- givne Lokelma 10 g yesterday- 9/10 back pain all the time- incision looks great- L arm attached edges-  Barriers to Discharge: Behavior/Mood;Weight bearing  restrictions;Uncontrolled Pain;Self-care education  Barriers to Discharge Comments: limited by self/self limited and pain- Possible Resolutions to Barriers/Weekly Focus: BMP tomorrow to f/u on K+- d/c 11/21   Continued Need for Acute Rehabilitation Level of Care: The patient requires daily medical management by a physician with specialized training in physical medicine and rehabilitation for the following reasons: Direction of a multidisciplinary physical rehabilitation program to maximize functional independence : Yes Medical management of patient  stability for increased activity during participation in an intensive rehabilitation regime.: Yes Analysis of laboratory values and/or radiology reports with any subsequent need for medication adjustment and/or medical intervention. : Yes   I attest that I was present, lead the team conference, and concur with the assessment and plan of the team.   Jearld Adjutant 09/23/2023, 9:35 AM

## 2023-09-24 LAB — GLUCOSE, CAPILLARY
Glucose-Capillary: 87 mg/dL (ref 70–99)
Glucose-Capillary: 83 mg/dL (ref 70–99)

## 2023-09-24 LAB — BASIC METABOLIC PANEL
Anion gap: 7 (ref 5–15)
BUN: 72 mg/dL — ABNORMAL HIGH (ref 8–23)
CO2: 21 mmol/L — ABNORMAL LOW (ref 22–32)
Calcium: 8.5 mg/dL — ABNORMAL LOW (ref 8.9–10.3)
Chloride: 109 mmol/L (ref 98–111)
Creatinine, Ser: 4.85 mg/dL — ABNORMAL HIGH (ref 0.61–1.24)
GFR, Estimated: 13 mL/min — ABNORMAL LOW (ref >60)
Glucose, Bld: 83 mg/dL (ref 70–99)
Potassium: 5 mmol/L (ref 3.5–5.1)
Sodium: 137 mmol/L (ref 135–145)

## 2023-09-24 MED ORDER — GABAPENTIN 300 MG PO CAPS: 300.0000 mg | ORAL_CAPSULE | Freq: Every day | ORAL | 0 refills | Status: DC

## 2023-09-24 MED ORDER — ACETAMINOPHEN 325 MG PO TABS: 325.0000 mg | ORAL_TABLET | ORAL | Status: DC | PRN

## 2023-09-24 MED ORDER — FLUTICASONE PROPIONATE 50 MCG/ACT NA SUSP: 2.0000 | Freq: Every day | NASAL | Status: AC

## 2023-09-24 MED ORDER — CALCIUM ACETATE (PHOS BINDER) 667 MG PO TABS: 667.0000 mg | ORAL_TABLET | Freq: Two times a day (BID) | ORAL | 0 refills | Status: DC

## 2023-09-24 MED ORDER — CALCITRIOL 0.25 MCG PO CAPS: 0.2500 ug | ORAL_CAPSULE | ORAL | 0 refills | Status: DC

## 2023-09-24 MED ORDER — CARVEDILOL 3.125 MG PO TABS: 3.1250 mg | ORAL_TABLET | Freq: Two times a day (BID) | ORAL | 0 refills | Status: DC

## 2023-09-24 MED ORDER — MELATONIN 5 MG PO TABS: 5.0000 mg | ORAL_TABLET | Freq: Every evening | ORAL | 0 refills | Status: DC | PRN

## 2023-09-24 MED ORDER — DARBEPOETIN ALFA 60 MCG/0.3ML IJ SOSY: 60.0000 ug | PREFILLED_SYRINGE | INTRAMUSCULAR | Status: DC

## 2023-09-24 MED ORDER — SODIUM BICARBONATE 650 MG PO TABS: 650.0000 mg | ORAL_TABLET | Freq: Three times a day (TID) | ORAL | 0 refills | Status: DC

## 2023-09-24 MED ORDER — TAMSULOSIN HCL 0.4 MG PO CAPS: 0.4000 mg | ORAL_CAPSULE | Freq: Every day | ORAL | 0 refills | Status: DC

## 2023-09-24 NOTE — Plan of Care (Signed)
  Problem: Consults Goal: RH SPINAL CORD INJURY PATIENT EDUCATION Description:  See Patient Education module for education specifics.  Outcome: Progressing   Problem: RH SKIN INTEGRITY Goal: RH STG SKIN FREE OF INFECTION/BREAKDOWN Description: Incision will remain intact and free of infection/breakdown with min assist  Outcome: Progressing Goal: RH STG MAINTAIN SKIN INTEGRITY WITH ASSISTANCE Description: STG Maintain Skin Integrity With cueing Assistance. Outcome: Progressing Goal: RH STG ABLE TO PERFORM INCISION/WOUND CARE W/ASSISTANCE Description: STG Able To Perform Incision/Wound Care With min Assistance. Outcome: Progressing   Problem: RH SAFETY Goal: RH STG ADHERE TO SAFETY PRECAUTIONS W/ASSISTANCE/DEVICE Description: STG Adhere to Safety Precautions With cueing Assistance/Device. Outcome: Progressing Goal: RH STG DECREASED RISK OF FALL WITH ASSISTANCE Description: STG Decreased Risk of Fall With min Assistance. Outcome: Progressing   Problem: RH PAIN MANAGEMENT Goal: RH STG PAIN MANAGED AT OR BELOW PT'S PAIN GOAL Description: Less than 4 with PRN medications min assist  Outcome: Progressing   Problem: RH KNOWLEDGE DEFICIT SCI Goal: RH STG INCREASE KNOWLEDGE OF SELF CARE AFTER SCI Description: Patient/caregiver will be able to managed medications, adhere to back precautions, manage bladder and bowel activities, and diet/life style modifications to improve health from nursing education and nursing handouts independently  Outcome: Progressing

## 2023-09-24 NOTE — Progress Notes (Addendum)
PROGRESS NOTE   Subjective/Complaints:   Pt reports happy to go home-  Dw/ pt about drinking a little more since renal  function is elevated slightly over normal.   Still wants to go home.  Tiny BM yesterday- last 2 days- doesn't want bowel meds before he goes home- explained it's an OTC med, so can get at pharmacy when picks up other meds.    ROS:    Pt denies SOB, abd pain, CP, N/V/C/D, and vision changes    Objective:   No results found. No results for input(s): "WBC", "HGB", "HCT", "PLT" in the last 72 hours.   Recent Labs    09/23/23 0618 09/24/23 0517  NA 139 137  K 5.3* 5.0  CL 110 109  CO2 21* 21*  GLUCOSE 82 83  BUN 69* 72*  CREATININE 4.81* 4.85*  CALCIUM 8.9 8.5*     Intake/Output Summary (Last 24 hours) at 09/24/2023 0815 Last data filed at 09/23/2023 1800 Gross per 24 hour  Intake 714 ml  Output 500 ml  Net 214 ml        Physical Exam: Vital Signs Blood pressure (!) 148/70, pulse 65, temperature 98.3 F (36.8 C), temperature source Oral, resp. rate 18, height 5\' 9"  (1.753 m), weight 75.1 kg, SpO2 99%.     General: awake, alert, appropriate, sitting EOB; eating breakfast; 75+% gone; NAD HENT: conjugate gaze; oropharynx moist CV: regular rate and rhythm; no JVD Pulmonary: CTA B/L; no W/R/R- good air movement GI: soft, NT, ND, (+)BS Psychiatric: appropriate- slightly frustrated about bowels-  Neurological: Ox3    MSK- R shoulder pain with ER/IR, k-tape in place,less pain with ROM  Skin: No evidence of breakdown, no evidence of rash   Neurologic: Cranial nerves II through XII intact, motor strength is 5/5 in bilateral deltoid, bicep, tricep, grip,3-/5 B  hip flexor, knee extensors,4-  ankle dorsiflexor and plantar flexor Sensory exam reduced LT below ankle on the right side and below supramalleolar area on left side  R>L hand intrinsic atrophy --no changes  11/17 Musculoskeletal: Full range of motion in all 4 extremities. No joint swelling   Assessment/Plan: 1. Functional deficits which require 3+ hours per day of interdisciplinary therapy in a comprehensive inpatient rehab setting. Physiatrist is providing close team supervision and 24 hour management of active medical problems listed below. Physiatrist and rehab team continue to assess barriers to discharge/monitor patient progress toward functional and medical goals  Care Tool:  Bathing    Body parts bathed by patient: Right arm, Left arm, Chest, Abdomen, Front perineal area, Face, Buttocks, Right upper leg, Left upper leg, Right lower leg, Left lower leg   Body parts bathed by helper: Right upper leg, Left upper leg, Right lower leg, Left lower leg, Buttocks     Bathing assist Assist Level: Supervision/Verbal cueing     Upper Body Dressing/Undressing Upper body dressing   What is the patient wearing?: Orthosis, Pull over shirt    Upper body assist Assist Level: Independent with assistive device Assistive Device Comment: TLSO  Lower Body Dressing/Undressing Lower body dressing      What is the patient wearing?: Underwear/pull up, Pants  Lower body assist Assist for lower body dressing: Supervision/Verbal cueing     Toileting Toileting    Toileting assist Assist for toileting: Supervision/Verbal cueing     Transfers Chair/bed transfer  Transfers assist     Chair/bed transfer assist level: Supervision/Verbal cueing     Locomotion Ambulation   Ambulation assist   Ambulation activity did not occur: Safety/medical concerns  Assist level: Supervision/Verbal cueing Assistive device: Walker-rolling Max distance: 150 ft   Walk 10 feet activity   Assist  Walk 10 feet activity did not occur: Safety/medical concerns  Assist level: Supervision/Verbal cueing Assistive device: Walker-rolling   Walk 50 feet activity   Assist Walk 50 feet with 2 turns  activity did not occur: Safety/medical concerns  Assist level: Supervision/Verbal cueing Assistive device: Walker-rolling    Walk 150 feet activity   Assist Walk 150 feet activity did not occur: Safety/medical concerns  Assist level: Supervision/Verbal cueing Assistive device: Walker-rolling    Walk 10 feet on uneven surface  activity   Assist Walk 10 feet on uneven surfaces activity did not occur: Safety/medical concerns   Assist level: Supervision/Verbal cueing Assistive device: Walker-rolling   Wheelchair     Assist Is the patient using a wheelchair?: No Type of Wheelchair: Manual    Wheelchair assist level: Supervision/Verbal cueing Max wheelchair distance: 250 ft    Wheelchair 50 feet with 2 turns activity    Assist        Assist Level: Supervision/Verbal cueing   Wheelchair 150 feet activity     Assist      Assist Level: Supervision/Verbal cueing   Blood pressure (!) 148/70, pulse 65, temperature 98.3 F (36.8 C), temperature source Oral, resp. rate 18, height 5\' 9"  (1.753 m), weight 75.1 kg, SpO2 99%.  Medical Problem List and Plan: 1. Functional deficits secondary to lumbar stenosis with radiculopathy s/p  L2-L5 DLIF with instrumentation 10/29 with Dr. Maisie Fus             -patient may shower             -ELOS/Goals: 10-14 days, Mod I PT/OT D/c 11/21  D/c today  Will need f/u with [facility]- NOT me  Will also need to see rneal/PCP in next 1-2 weeks to get labs checked. Renal labs slightly up- encouraged to drink  Con't K taping of R shoulder 2.  Antithrombotics: -DVT/anticoagulation:  Pharmaceutical: Heparin             -antiplatelet therapy: none   3. Pain Management: Tylenol, Robaxin, oxycodone as needed             -Continue gabapentin 100 mg twice daily Cont oxy IR 10mg  q 4 prn, if somnolent consider change to hydromorphone given renal status    11/11- not lethargic- so will maintain dosing for now  11/12- having more pain due to  soreness from therapies- will maintain regimen- has Robaxin as needed for muscle spasms  11/13- got Xray of R shoulder- shows signs of RTC- but pt also having concern about fx?-xray results suggested a CT- but computer wanted me to get MRI- R shoulder MRI ordered- will add Voltaren gel 2g QID to try and help inflammation.   11/14- pain doing MUCH better- con't regimen- Ortho referral when discharged  11/15- pain much better- now intermittent in R shoulder- still having leg pain- sounds like from therapy  11/16-17 pt reports that he's in ongoing pain. However he's not using oxycodone but 3 x per day over last 2 days. Will  hold off any changes today as pain appears generally improved.  11/18- Pain stable on Oxy- con't regimen  11/19- taking Oxy q4 hours- advised to try and take ~q5 hours  11/20- will get pain meds after 7 days from [facility]  11/21- Has ~ 80 tabs of MSIR at home- so will NOT need 7 days of pain meds 4. Mood/Behavior/Sleep: LCSW to evaluate and provide emotional support             -antipsychotic agents: n/a   5. Neuropsych/cognition: This patient is capable of making decisions on his own behalf.   6. Skin/Wound Care: Routine skin care checks              - R toe sutures removed on admission, WOC order for betadyne BID   7. Fluids/Electrolytes/Nutrition: Strict Is and Os and follow-up chemistries   8: Hypertension: monitor TID and prn; appears stable on oral agents             -continue amlodipine 10 mg daily             -continue carvedilol 3.125 mg twice daily Vitals:   09/23/23 1953 09/24/23 0521  BP: (!) 143/73 (!) 148/70  Pulse: 68 65  Resp: 16 18  Temp: 98.9 F (37.2 C) 98.3 F (36.8 C)  SpO2: 99% 99%  Controlled 11/9  11/11- BP controlled in last 24 hours, but was real high yesterday early AM- however nothing like that since- not sure if was correct? Not clear   11/16 bp controlled--no changes  11/19- BP controlled- con't regimen 9: Diabetes mellitus: CBGs 4 times  daily; A1c = 5.7%             -continue sliding scale insulin 0 to 15 units             -? resume home Lantus 5 units daily   CBG (last 3)  Recent Labs    09/23/23 1622 09/23/23 2112 09/24/23 0555  GLUCAP 130* 125* 87   Controlled 11/10  11/11- CBGs controlled- look good-c on't regimen  11/12- not on Home dose Lantus, but CBGs look great- will not rstart right now  11/6  controlled no changes  11/18- Cbgs look great- no home Lantus  1119-11/20- NO change in Cbgs= looking good- con't regimen  11/21- haven't had on Lantus- but if BG's increase, can restart at home 10: Urinary retention, acute, h/o BPH:              -had indwelling Foley catheter -continue Flomax 0.4 mg daily  -Foley placed 10/30; DC foley trial with ISC Q6H for PVR >350 ccs to start in AM 11/9   11/11- foley out-= last PVR 108cc- will monitor with bladder scans  11/15- peeing well- scans look OK- running 0-150s- on Flomax- 0.4 mg daily 11: AKI/CKD stage IV: Nearing need for hemodialysis             -Nephrology signed off, he has had LUE AV fistula created 11/7 (follow-up arranged with [facility])- pt will f/u with Dr Wolfgang Phoenix as OP              -On Calcitrol, PhosLo    11/11- looking slightly better this AM  11/18- Calcium 8.7 12: 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 Hx IBS but no further diarrhea   11/12- LBM last night  11/14- changed Colace to prn- and miralax is prn- pt had 2 bowel accidents from meds- doesn't want anymore  11/15- small BM yesterday- otherwise 2 days ago  11/18- LBM yesterday- loose  11/19- LBM yesterday x2  11/21- LBM small yesterday, but  doesn't want bowel meds before leaves 16: Right 4th toe laceration PTA: well-healed, discontinue sutures.    17. ? R biceps tear - documented per record in 2022 by Dr. Hilda Lias at [place]. Likely lost to follow up; would re-establish as OP.   11/15- will send referral to [facility] at d/c- since has severe multiple muscle RTC tear of R shoulder-    11/17 continue k-tape, pain mgt as above 18. Hx cervical stenosis s/p C4-T1 PCDF 2022, then C4/5 ACDF 11/2022 with Dr. Maisie Fus. Pain regimen as above.     19.  Mild hyperK+ due to CKD    11/11- K+ down to 5.0  11/17 f/u labs Monday  11/18- K+ 5.4- will give Lokelma- 10 G x1  11!9- will recheck K+ tomorrow  1120- K+ down to 5.3- will give another dose of Lokelma and check with pharmacy if any meds increasing potassium  11/21- K+ 5.0- need to avoid OJ and bananas and have PCP/renal recheck labs in the next 1-2 weeks.    The patient is medically ready for discharge to home and will not need follow-up with [facility]. In addition, they will need to follow up with their PCP, Neurosurgery and Nephrology.  Needs to see PCP vs renal in next 7-10 days to check renal and K+ labs- advised to avoid OJ and bananas- also needs to drink a little more since renal function  a little worse- doesn't want to hold d/c for this.  Also has MSIR 80-85 pills at home- sister picked up 10/29- doesn't need 7 days of pain meds when leaves due to this.    I spent a total of 34   minutes on total care today- >50% coordination of care- due to  D/w PA about d/c plan and d/w pt about labs and foods to avoid- as well as discussed pain meds and bowels  LOS: 13 days A FACE TO FACE EVALUATION WAS PERFORMED  Curtis Hartman 09/24/2023, 8:15 AM

## 2023-10-12 ENCOUNTER — Other Ambulatory Visit: Payer: Self-pay

## 2023-10-12 DIAGNOSIS — N183 Chronic kidney disease, stage 3 unspecified: Secondary | ICD-10-CM

## 2023-10-21 ENCOUNTER — Encounter: Payer: Self-pay | Admitting: Gastroenterology

## 2023-10-21 ENCOUNTER — Ambulatory Visit: Payer: Medicaid Other | Admitting: Gastroenterology

## 2023-10-23 ENCOUNTER — Ambulatory Visit (HOSPITAL_COMMUNITY): Payer: Medicaid Other | Attending: Vascular Surgery

## 2023-10-29 IMAGING — US US BIOPSY
1 series · 13 of 13 positions shown · non-contrast
Comparison: none

INDICATION: 60-year-old male referred for medical renal biopsy

[Series 1: us biopsy (kidney) · 13 of 13 slices shown]
[im 1/13]
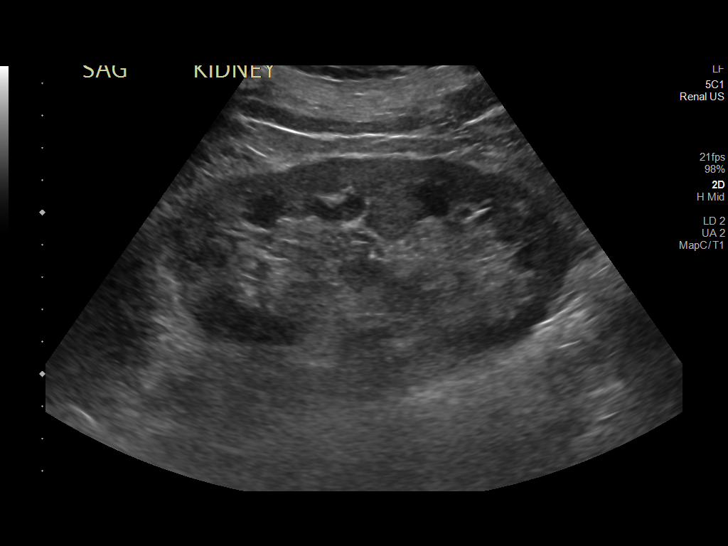
[im 2/13]
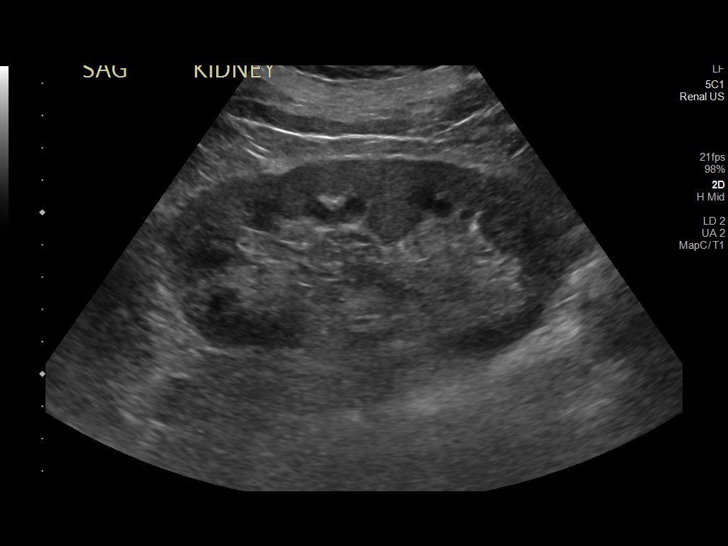
[im 3/13]
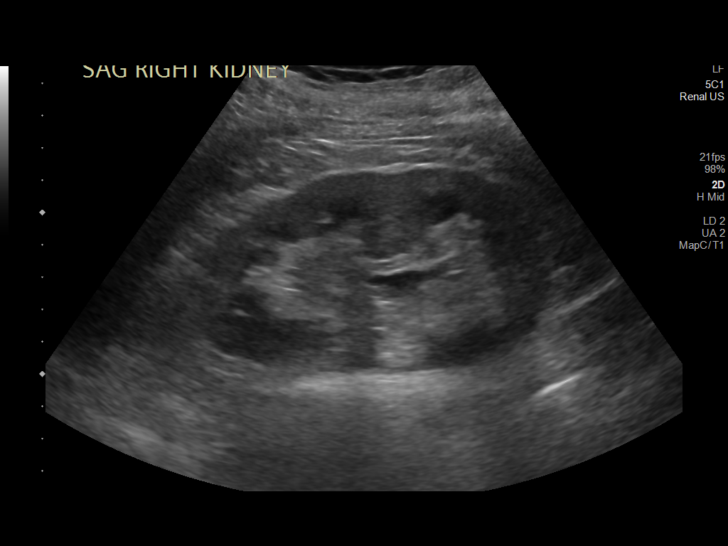
[im 4/13]
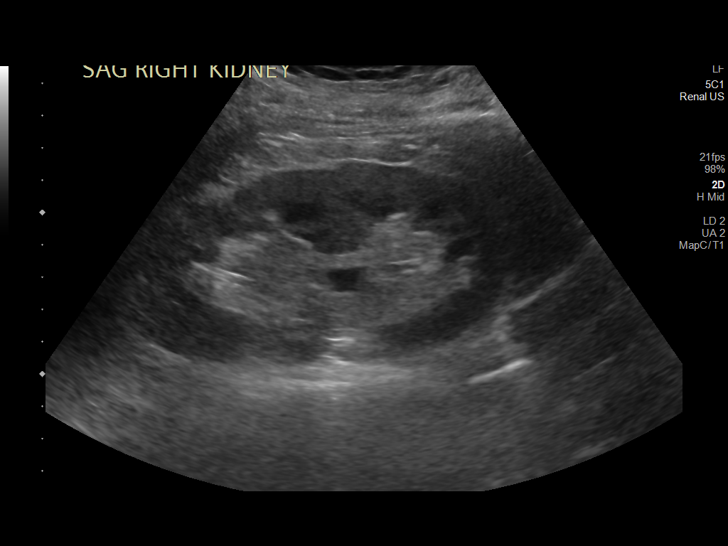
[im 5/13]
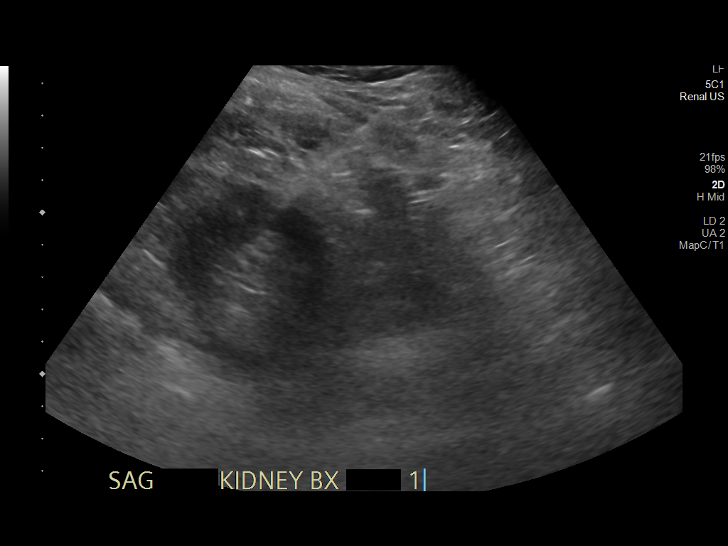
[im 6/13]
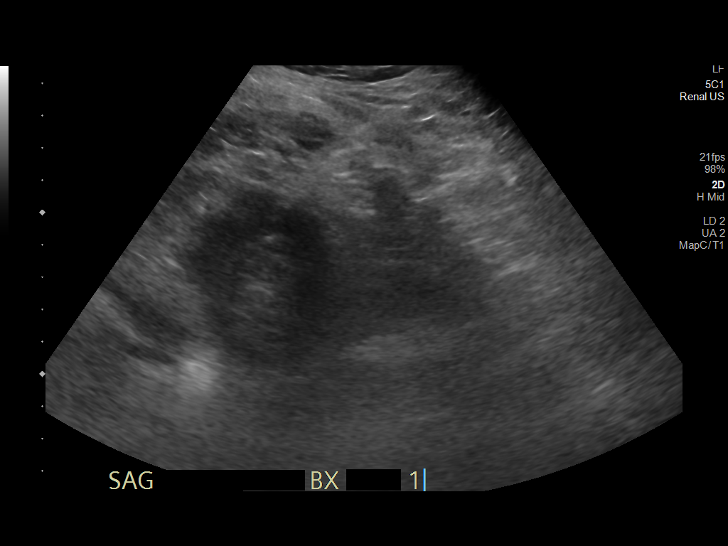
[im 7/13]
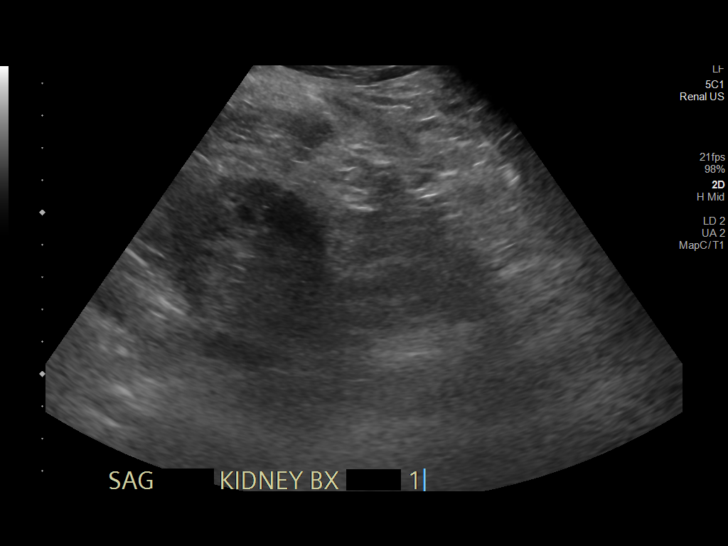
[im 8/13]
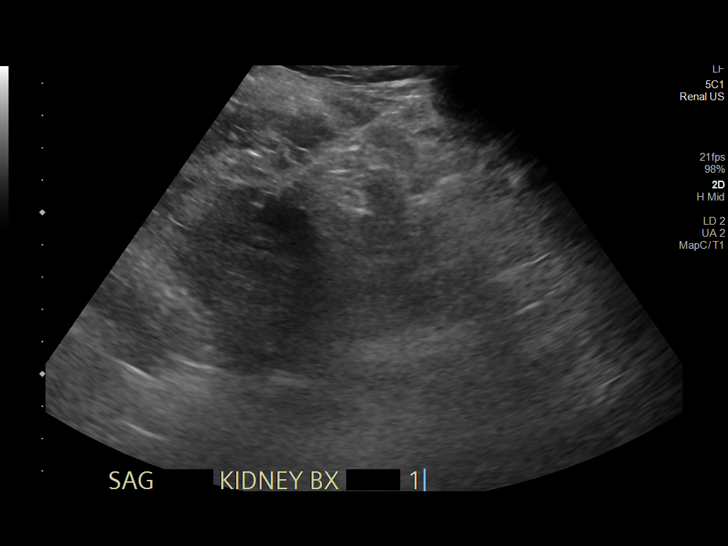
[im 9/13]
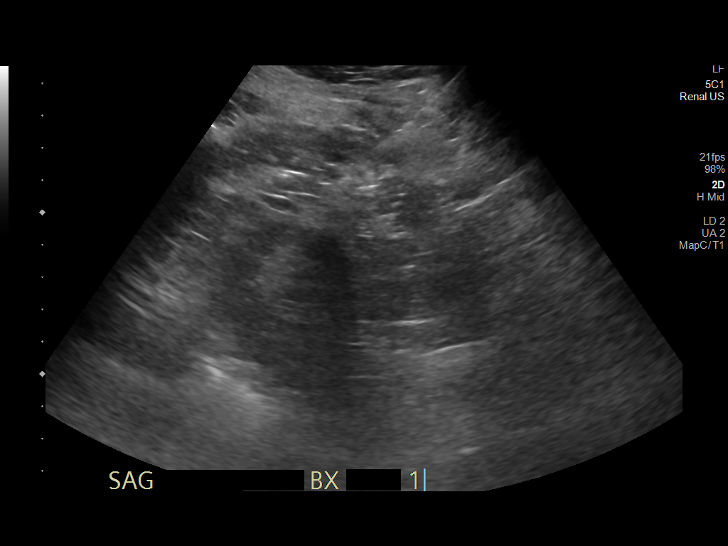
[im 10/13]
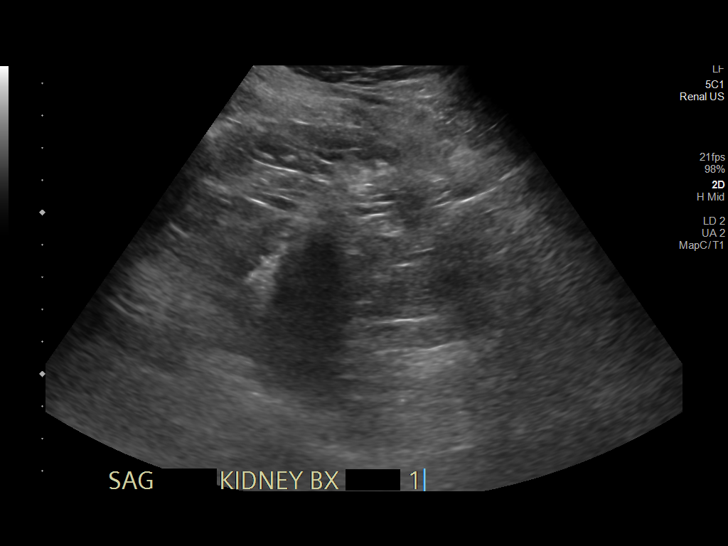
[im 11/13]
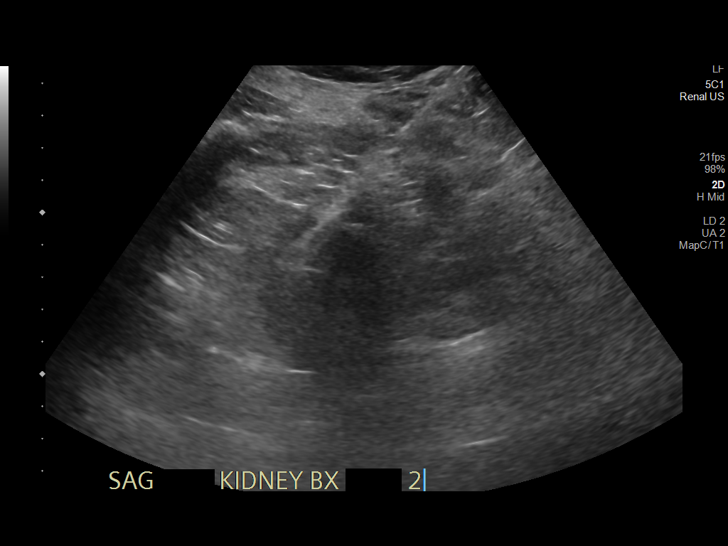
[im 12/13]
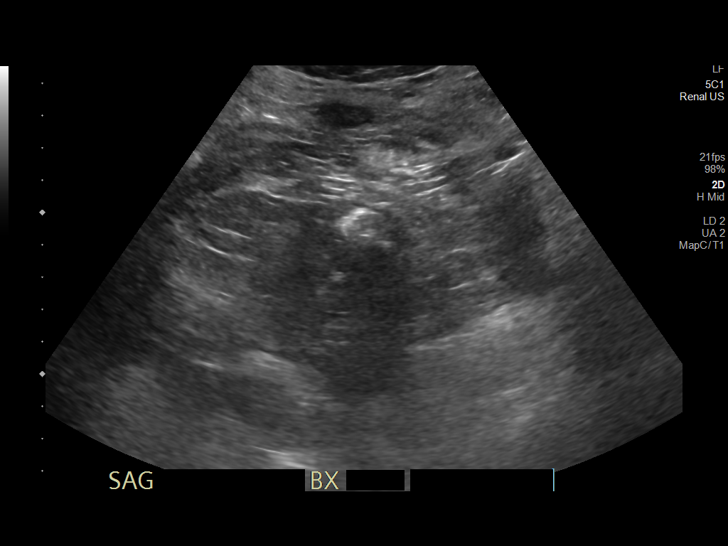
[im 13/13]
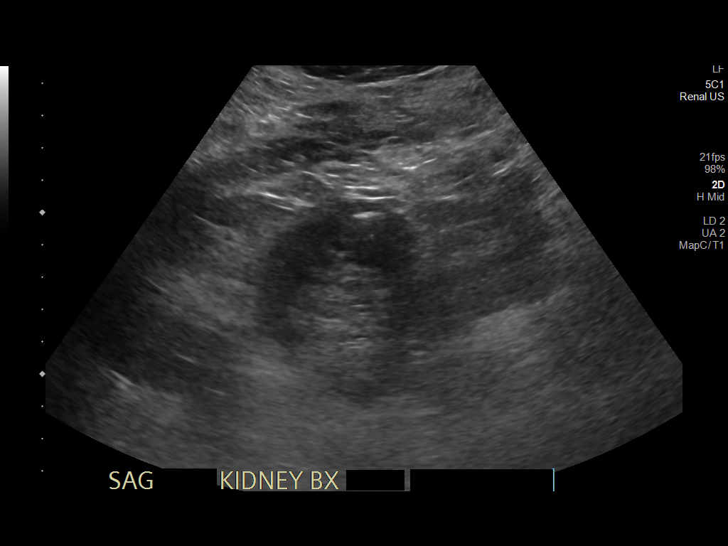

[13 of 13 positions shown; findings below may reference images not displayed]

EXAM:
IMAGE GUIDED MEDICAL RENAL BIOPSY

MEDICATIONS:
20 mg IV hydralazine

ANESTHESIA/SEDATION:
Moderate (conscious) sedation was employed during this procedure. A
total of Versed 2.0 mg and Fentanyl 100 mcg was administered
intravenously by the radiology nurse.

Total intra-service moderate Sedation Time: 12 minutes. The
patient's level of consciousness and vital signs were monitored
continuously by radiology nursing throughout the procedure under my
direct supervision.

FLUOROSCOPY TIME:  Ultrasound

COMPLICATIONS:
None

PROCEDURE:
Informed written consent was obtained from the patient after a
thorough discussion of the procedural risks, benefits and
alternatives. All questions were addressed. Maximal Sterile Barrier
Technique was utilized including caps, mask, sterile gowns, sterile
gloves, sterile drape, hand hygiene and skin antiseptic. A timeout
was performed prior to the initiation of the procedure.

Patient was positioned prone position on the gantry table. Images
were stored sent to PACs.

Once the patient is prepped and draped in the usual sterile fashion,
the skin and subcutaneous tissues overlying the left kidney were
generously infiltrated 1% lidocaine for local anesthesia.

Using ultrasound guidance, a 15 gauge guide needle was advanced into
the lower, lateral cortex of the left kidney.

Once we confirmed location of the needle tip, 2 separate 16 gauge
core biopsy were achieved.

Two Gel-Foam pledgets were infused with a small amount of saline.
The needle was removed.

Final images were stored.

The patient tolerated the procedure well and remained
hemodynamically stable throughout.

No complications were encountered and no significant blood loss
encountered.
IMPRESSION: Status post ultrasound-guided medical renal biopsy.

## 2023-11-09 ENCOUNTER — Other Ambulatory Visit: Payer: Self-pay | Admitting: Physician Assistant

## 2023-11-13 ENCOUNTER — Ambulatory Visit (HOSPITAL_COMMUNITY): Payer: Medicaid Other

## 2024-01-04 ENCOUNTER — Other Ambulatory Visit (HOSPITAL_COMMUNITY)
Admission: RE | Admit: 2024-01-04 | Discharge: 2024-01-04 | Disposition: A | Discharge status: Home / Self Care | Source: Ambulatory Visit | Attending: Nephrology | Admitting: Nephrology

## 2024-01-04 DIAGNOSIS — N189 Chronic kidney disease, unspecified: Secondary | ICD-10-CM | POA: Insufficient documentation

## 2024-01-04 DIAGNOSIS — E211 Secondary hyperparathyroidism, not elsewhere classified: Secondary | ICD-10-CM | POA: Insufficient documentation

## 2024-01-04 DIAGNOSIS — R809 Proteinuria, unspecified: Secondary | ICD-10-CM | POA: Diagnosis present

## 2024-01-04 DIAGNOSIS — D631 Anemia in chronic kidney disease: Secondary | ICD-10-CM | POA: Insufficient documentation

## 2024-01-04 LAB — RENAL FUNCTION PANEL
Albumin: 3.1 g/dL — ABNORMAL LOW (ref 3.5–5.0)
Anion gap: 10 (ref 5–15)
BUN: 83 mg/dL — ABNORMAL HIGH (ref 8–23)
CO2: 20 mmol/L — ABNORMAL LOW (ref 22–32)
Calcium: 8.2 mg/dL — ABNORMAL LOW (ref 8.9–10.3)
Chloride: 111 mmol/L (ref 98–111)
Creatinine, Ser: 6.55 mg/dL — ABNORMAL HIGH (ref 0.61–1.24)
GFR, Estimated: 9 mL/min — ABNORMAL LOW (ref >60)
Glucose, Bld: 107 mg/dL — ABNORMAL HIGH (ref 70–99)
Phosphorus: 8.4 mg/dL — ABNORMAL HIGH (ref 2.5–4.6)
Potassium: 5.4 mmol/L — ABNORMAL HIGH (ref 3.5–5.1)
Sodium: 141 mmol/L (ref 135–145)

## 2024-01-05 ENCOUNTER — Other Ambulatory Visit: Payer: Self-pay

## 2024-01-05 DIAGNOSIS — N183 Chronic kidney disease, stage 3 unspecified: Secondary | ICD-10-CM

## 2024-01-26 ENCOUNTER — Ambulatory Visit (INDEPENDENT_AMBULATORY_CARE_PROVIDER_SITE_OTHER)

## 2024-01-26 VITALS — BP 154/76 | HR 67 | Temp 98.2°F | Ht 69.0 in | Wt 172.0 lb

## 2024-01-26 DIAGNOSIS — N186 End stage renal disease: Secondary | ICD-10-CM | POA: Diagnosis not present

## 2024-01-26 DIAGNOSIS — Z992 Dependence on renal dialysis: Secondary | ICD-10-CM | POA: Diagnosis not present

## 2024-01-26 DIAGNOSIS — N183 Chronic kidney disease, stage 3 unspecified: Secondary | ICD-10-CM

## 2024-01-26 NOTE — Progress Notes (Signed)
 Office Note     CC:  follow up Requesting Provider:  Wilmon Pali, FNP  HPI: Curtis Hartman is a 63 y.o. (23-Jun-1961) male who presents status post left brachiocephalic fistula creation by Dr. Hetty Blend in November 2024.  He is scheduled to initiate hemodialysis tomorrow at [facility] in [place].  He also had a peritoneal dialysis catheter placed in [place] this month however this will need to be removed as he did not qualify for peritoneal dialysis at home.  He denies steal symptoms in his left hand.   Past Medical History:  Diagnosis Date   Arthritis    CKD (chronic kidney disease)    COPD (chronic obstructive pulmonary disease) (HCC)    Diabetes mellitus without complication (HCC)    type 2   GERD (gastroesophageal reflux disease)    Headache    Hx Migraines   Hepatitis    Hypertension    Neuropathy     Past Surgical History:  Procedure Laterality Date   ANTERIOR CERVICAL DECOMP/DISCECTOMY FUSION N/A 11/05/2022   Procedure: Anterior Cervical Decompression/discectomy Fusion Cervical Four-Cervical Five;  Surgeon: Bedelia Person, MD;  Location: [facility] OR;  Service: Neurosurgery;  Laterality: N/A;  3C   ANTERIOR LAT LUMBAR FUSION N/A 09/01/2023   Procedure: DIRECT LUMBAR INTERBODY FUSION, LUMBAR TWO-LUMBAR THREE, LUMBAR THREE-LUMBAR FOUR, LUMBAR FOUR-LUMBAR FIVE, RIGHT PRONE TRANSPSOAS, LUMBAR TWO LUMBAR FIVE POSTERIOR PERCUTANEOUS FUSION LUMBAR FOUR-LUMBAR FIVE MINIMALLY INVASIVE LAMINECTOMY, FORAMINOTOMY;  Surgeon: Bedelia Person, MD;  Location: [facility] OR;  Service: Neurosurgery;  Laterality: N/A;   AV FISTULA PLACEMENT Left 09/10/2023   Procedure: LEFT ARM ARTERIOVENOUS (AV) FISTULA CREATION;  Surgeon: Daria Pastures, MD;  Location: [facility] OR;  Service: Vascular;  Laterality: Left;   BACK SURGERY  2023   Cervical Spine   BIOPSY  03/03/2022   Procedure: BIOPSY;  Surgeon: Lanelle Bal, DO;  Location: [facility] ENDO SUITE;  Service: Endoscopy;;   COLONOSCOPY WITH PROPOFOL N/A  03/03/2022   Procedure: COLONOSCOPY WITH PROPOFOL;  Surgeon: Lanelle Bal, DO;  Location: [facility] ENDO SUITE;  Service: Endoscopy;  Laterality: N/A;  8:00am   ESOPHAGOGASTRODUODENOSCOPY (EGD) WITH PROPOFOL N/A 03/03/2022   Procedure: ESOPHAGOGASTRODUODENOSCOPY (EGD) WITH PROPOFOL;  Surgeon: Lanelle Bal, DO;  Location: [facility] END[facility] SUITE;  Service: Endoscopy;  Laterality: N/A;   foot right partial toe amputation Right    FOOT SURGERY Right    x3   hernia surgery x4     LUMBAR PERCUTANEOUS PEDICLE SCREW 3 LEVEL N/A 09/01/2023   Procedure: L2-5 LUMBAR PERCUTANEOUS PEDICLE SCREW 3 LEVEL;  Surgeon: Bedelia Person, MD;  Location: [facility] OR;  Service: Neurosurgery;  Laterality: N/A;   POLYPECTOMY  03/03/2022   Procedure: POLYPECTOMY;  Surgeon: Lanelle Bal, DO;  Location: [facility];  Service: Endoscopy;;    Social History   Socioeconomic History   Marital status: Single    Spouse name: Not on file   Number of children: Not on file   Years of education: Not on file   Highest education level: Not on file  Occupational History   Not on file  Tobacco Use   Smoking status: Every Day    Current packs/day: 0.25    Types: Cigarettes    Passive exposure: Never   Smokeless tobacco: Never   Tobacco comments:    Pt smokes a couple cigarettes a day (as of 08/26/23)  Vaping Use   Vaping status: Never Used  Substance and Sexual Activity   Alcohol use: Not Currently  Drug use: Not Currently   Sexual activity: Not Currently  Other Topics Concern   Not on file  Social History Narrative   Not on file   Social Drivers of Health   Financial Resource Strain: High Risk (01/15/2024)   Received from [facility]   Overall Financial Resource Strain (CARDIA)    Difficulty of Paying Living Expenses: Very hard  Food Insecurity: Food Insecurity Present (01/15/2024)   Received from [facility]   Hunger Vital Sign    Worried About Running Out of Food in the Last Year: Often true    Ran Out  of Food in the Last Year: Sometimes true  Transportation Needs: Unmet Transportation Needs (01/15/2024)   Received from [facility] - Transportation    Lack of Transportation (Medical): Yes    Lack of Transportation (Non-Medical): Yes  Physical Activity: Unknown (01/15/2024)   Received from [facility]   Exercise Vital Sign    Days of Exercise per Week: 0 days    Minutes of Exercise per Session: Not on file  Stress: Stress Concern Present (01/15/2024)   Received from [facility] - Occupational Stress Questionnaire    Feeling of Stress : To some extent  Social Connections: Somewhat Isolated (01/15/2024)   Received from [facility]   Social Network    How would you rate your social network (family, work, friends)?: Restricted participation with some degree of social isolation  Intimate Partner Violence: Not At Risk (01/15/2024)   Received from [facility]   HITS    Over the last 12 months how often did your partner physically hurt you?: Never    Over the last 12 months how often did your partner insult you or talk down to you?: Never    Over the last 12 months how often did your partner threaten you with physical harm?: Never    Over the last 12 months how often did your partner scream or curse at you?: Never    Family History  Problem Relation Age of Onset   Heart disease Mother    Seizures Father    Stroke Maternal Grandfather    Liver disease Paternal Grandmother     Current Outpatient Medications  Medication Sig Dispense Refill   acetaminophen (TYLENOL) 325 MG tablet Take 1-2 tablets (325-650 mg total) by mouth every 4 (four) hours as needed for mild pain (pain score 1-3).     albuterol (PROAIR HFA) 108 (90 Base) MCG/ACT inhaler 2 puffs every 4 hours as needed only  if your can't catch your breath 18 g 11   amLODipine (NORVASC) 10 MG tablet Take 10 mg by mouth daily as needed (diastolic blood pressure above 90).      Budeson-Glycopyrrol-Formoterol (BREZTRI AEROSPHERE) 160-9-4.8 MCG/ACT AERO Inhale 2 puffs into the lungs in the morning and at bedtime. (Patient taking differently: Inhale 2 puffs into the lungs 2 (two) times daily as needed (shortness of breath).)     calcitRIOL (ROCALTROL) 0.25 MCG capsule Take 1 capsule (0.25 mcg total) by mouth 3 (three) times a week. 12 capsule 0   calcium acetate (PHOSLO) 667 MG tablet Take 1 tablet (667 mg total) by mouth 2 (two) times daily. 60 tablet 0   carvedilol (COREG) 3.125 MG tablet Take 1 tablet (3.125 mg total) by mouth 2 (two) times daily with a meal. 60 tablet 0   cephALEXin (KEFLEX) 500 MG capsule Take 500 mg by mouth 2 (two) times daily.  Darbepoetin Alfa (ARANESP) 60 MCG/0.3ML SOSY injection Inject 0.3 mLs (60 mcg total) into the skin every Wednesday at 6 PM.     fluticasone (FLONASE) 50 MCG/ACT nasal spray Place 2 sprays into both nostrils daily.     gabapentin (NEURONTIN) 300 MG capsule Take 1 capsule (300 mg total) by mouth at bedtime. 30 capsule 0   LANTUS 100 UNIT/ML injection Inject 5 Units into the skin at bedtime as needed (blood sugar over 150).     melatonin 5 MG TABS Take 1 tablet (5 mg total) by mouth at bedtime as needed. 30 tablet 0   morphine (MSIR) 15 MG tablet Take 15 mg by mouth every 6 (six) hours as needed for severe pain.     sodium bicarbonate 650 MG tablet Take 1 tablet (650 mg total) by mouth 3 (three) times daily. 90 tablet 0   tamsulosin (FLOMAX) 0.4 MG CAPS capsule Take 1 capsule (0.4 mg total) by mouth daily. 30 capsule 0   Wheat Dextrin (BENEFIBER) POWD Mix 2 - 3 teaspoons in 8 ounces of water daily in the mornings. 475 g 0   No current facility-administered medications for this visit.    No Known Allergies   REVIEW OF SYSTEMS:   [X]  denotes positive finding, [ ]  denotes negative finding Cardiac  Comments:  Chest pain or chest pressure:    Shortness of breath upon exertion:    Short of breath when lying flat:     Irregular heart rhythm:        Vascular    Pain in calf, thigh, or hip brought on by ambulation:    Pain in feet at night that wakes you up from your sleep:     Blood clot in your veins:    Leg swelling:         Pulmonary    Oxygen at home:    Productive cough:     Wheezing:         Neurologic    Sudden weakness in arms or legs:     Sudden numbness in arms or legs:     Sudden onset of difficulty speaking or slurred speech:    Temporary loss of vision in one eye:     Problems with dizziness:         Gastrointestinal    Blood in stool:     Vomited blood:         Genitourinary    Burning when urinating:     Blood in urine:        Psychiatric    Major depression:         Hematologic    Bleeding problems:    Problems with blood clotting too easily:        Skin    Rashes or ulcers:        Constitutional    Fever or chills:      PHYSICAL EXAMINATION:  Vitals:   01/26/24 1053  BP: (!) 154/76  Pulse: 67  Temp: 98.2 F (36.8 C)  TempSrc: Oral  SpO2: 99%  Weight: 172 lb (78 kg)  Height: 5\' 9"  (1.753 m)    General:  WDWN in NAD; vital signs documented above Gait: Not observed HENT: WNL, normocephalic Pulmonary: normal non-labored breathing , without Rales, rhonchi,  wheezing Cardiac: regular HR Abdomen: soft, NT, no masses Skin: without rashes Vascular Exam/Pulses: Palpable left radial pulse Extremities: Palpable thrill through cephalic vein fistula Musculoskeletal: no muscle wasting or atrophy  Neurologic: A&O X  3 Psychiatric:  The pt has Normal affect.   Non-Invasive Vascular Imaging:   Widely patent brachiocephalic fistula with large branch in the distal upper arm Diameter greater than 6 mm and fistula is less than 6 mm from the surface    ASSESSMENT/PLAN:: 63 y.o. male here for follow up for evaluation of left arm brachiocephalic fistula  Left arm brachiocephalic fistula widely patent without signs or symptoms of steal syndrome in the left hand.   Based on physical exam and duplex, fistula is ready for cannulation.  He is scheduled to initiate hemodialysis tomorrow.  If he has trouble with cannulation in the future we may consider ligation of large sidebranch in the distal upper arm.  He will follow-up with surgeon in [facility] for PD catheter removal.  He will follow-up with [place] as needed.   Emilie Rutter, PA-C [facility] [phone]

## 2024-05-04 ENCOUNTER — Emergency Department (HOSPITAL_COMMUNITY)

## 2024-05-04 ENCOUNTER — Inpatient Hospital Stay (HOSPITAL_COMMUNITY)
Admission: EM | Admit: 2024-05-04 | Discharge: 2024-05-11 | DRG: 091 | Disposition: A | Discharge status: Home / Self Care | Attending: Internal Medicine | Admitting: Internal Medicine

## 2024-05-04 ENCOUNTER — Encounter (HOSPITAL_COMMUNITY): Payer: Self-pay | Admitting: *Deleted

## 2024-05-04 DIAGNOSIS — K219 Gastro-esophageal reflux disease without esophagitis: Secondary | ICD-10-CM | POA: Diagnosis present

## 2024-05-04 DIAGNOSIS — Z7982 Long term (current) use of aspirin: Secondary | ICD-10-CM

## 2024-05-04 DIAGNOSIS — R29710 NIHSS score 10: Secondary | ICD-10-CM | POA: Diagnosis not present

## 2024-05-04 DIAGNOSIS — I120 Hypertensive chronic kidney disease with stage 5 chronic kidney disease or end stage renal disease: Secondary | ICD-10-CM | POA: Diagnosis present

## 2024-05-04 DIAGNOSIS — R519 Headache, unspecified: Secondary | ICD-10-CM | POA: Diagnosis not present

## 2024-05-04 DIAGNOSIS — I639 Cerebral infarction, unspecified: Principal | ICD-10-CM | POA: Diagnosis present

## 2024-05-04 DIAGNOSIS — Z8249 Family history of ischemic heart disease and other diseases of the circulatory system: Secondary | ICD-10-CM

## 2024-05-04 DIAGNOSIS — Z794 Long term (current) use of insulin: Secondary | ICD-10-CM

## 2024-05-04 DIAGNOSIS — F17210 Nicotine dependence, cigarettes, uncomplicated: Secondary | ICD-10-CM | POA: Diagnosis present

## 2024-05-04 DIAGNOSIS — Z981 Arthrodesis status: Secondary | ICD-10-CM

## 2024-05-04 DIAGNOSIS — T402X5A Adverse effect of other opioids, initial encounter: Secondary | ICD-10-CM | POA: Diagnosis present

## 2024-05-04 DIAGNOSIS — E1142 Type 2 diabetes mellitus with diabetic polyneuropathy: Secondary | ICD-10-CM | POA: Diagnosis present

## 2024-05-04 DIAGNOSIS — I63542 Cerebral infarction due to unspecified occlusion or stenosis of left cerebellar artery: Secondary | ICD-10-CM | POA: Diagnosis not present

## 2024-05-04 DIAGNOSIS — Z8673 Personal history of transient ischemic attack (TIA), and cerebral infarction without residual deficits: Secondary | ICD-10-CM | POA: Diagnosis present

## 2024-05-04 DIAGNOSIS — I10 Essential (primary) hypertension: Secondary | ICD-10-CM | POA: Diagnosis present

## 2024-05-04 DIAGNOSIS — E669 Obesity, unspecified: Secondary | ICD-10-CM | POA: Diagnosis present

## 2024-05-04 DIAGNOSIS — G9341 Metabolic encephalopathy: Secondary | ICD-10-CM

## 2024-05-04 DIAGNOSIS — N186 End stage renal disease: Secondary | ICD-10-CM | POA: Diagnosis not present

## 2024-05-04 DIAGNOSIS — E1122 Type 2 diabetes mellitus with diabetic chronic kidney disease: Secondary | ICD-10-CM | POA: Diagnosis present

## 2024-05-04 DIAGNOSIS — D631 Anemia in chronic kidney disease: Secondary | ICD-10-CM | POA: Diagnosis present

## 2024-05-04 DIAGNOSIS — G928 Other toxic encephalopathy: Principal | ICD-10-CM | POA: Diagnosis present

## 2024-05-04 DIAGNOSIS — Z823 Family history of stroke: Secondary | ICD-10-CM

## 2024-05-04 DIAGNOSIS — G9340 Encephalopathy, unspecified: Secondary | ICD-10-CM | POA: Diagnosis present

## 2024-05-04 DIAGNOSIS — G8929 Other chronic pain: Secondary | ICD-10-CM | POA: Diagnosis present

## 2024-05-04 DIAGNOSIS — I63531 Cerebral infarction due to unspecified occlusion or stenosis of right posterior cerebral artery: Secondary | ICD-10-CM | POA: Diagnosis not present

## 2024-05-04 DIAGNOSIS — K610 Anal abscess: Secondary | ICD-10-CM | POA: Diagnosis present

## 2024-05-04 DIAGNOSIS — R531 Weakness: Principal | ICD-10-CM

## 2024-05-04 DIAGNOSIS — Z992 Dependence on renal dialysis: Secondary | ICD-10-CM

## 2024-05-04 DIAGNOSIS — Z79899 Other long term (current) drug therapy: Secondary | ICD-10-CM

## 2024-05-04 DIAGNOSIS — Z716 Tobacco abuse counseling: Secondary | ICD-10-CM

## 2024-05-04 DIAGNOSIS — B182 Chronic viral hepatitis C: Secondary | ICD-10-CM | POA: Diagnosis present

## 2024-05-04 DIAGNOSIS — R634 Abnormal weight loss: Secondary | ICD-10-CM

## 2024-05-04 DIAGNOSIS — Z6823 Body mass index (BMI) 23.0-23.9, adult: Secondary | ICD-10-CM

## 2024-05-04 DIAGNOSIS — J449 Chronic obstructive pulmonary disease, unspecified: Secondary | ICD-10-CM | POA: Diagnosis not present

## 2024-05-04 LAB — HEPATIC FUNCTION PANEL
ALT: 9 U/L (ref 0–44)
AST: 14 U/L — ABNORMAL LOW (ref 15–41)
Albumin: 3.2 g/dL — ABNORMAL LOW (ref 3.5–5.0)
Alkaline Phosphatase: 52 U/L (ref 38–126)
Bilirubin, Direct: <0.1 mg/dL (ref 0.0–0.2)
Total Bilirubin: 0.6 mg/dL (ref 0.0–1.2)
Total Protein: 6.6 g/dL (ref 6.5–8.1)

## 2024-05-04 LAB — CBC WITH DIFFERENTIAL/PLATELET
Abs Immature Granulocytes: 0.06 10*3/uL (ref 0.00–0.07)
Basophils Absolute: 0.1 10*3/uL (ref 0.0–0.1)
Basophils Relative: 0 %
Eosinophils Absolute: 0.2 10*3/uL (ref 0.0–0.5)
Eosinophils Relative: 1 %
HCT: 37.6 % — ABNORMAL LOW (ref 39.0–52.0)
Hemoglobin: 12.1 g/dL — ABNORMAL LOW (ref 13.0–17.0)
Immature Granulocytes: 1 %
Lymphocytes Relative: 7 %
Lymphs Abs: 0.7 10*3/uL (ref 0.7–4.0)
MCH: 30.2 pg (ref 26.0–34.0)
MCHC: 32.2 g/dL (ref 30.0–36.0)
MCV: 93.8 fL (ref 80.0–100.0)
Monocytes Absolute: 0.9 10*3/uL (ref 0.1–1.0)
Monocytes Relative: 8 %
Neutro Abs: 9.4 10*3/uL — ABNORMAL HIGH (ref 1.7–7.7)
Neutrophils Relative %: 83 %
Platelets: 264 10*3/uL (ref 150–400)
RBC: 4.01 MIL/uL — ABNORMAL LOW (ref 4.22–5.81)
RDW: 15 % (ref 11.5–15.5)
WBC: 11.2 10*3/uL — ABNORMAL HIGH (ref 4.0–10.5)
nRBC: 0 % (ref 0.0–0.2)

## 2024-05-04 LAB — BASIC METABOLIC PANEL WITH GFR
Anion gap: 13 (ref 5–15)
BUN: 52 mg/dL — ABNORMAL HIGH (ref 8–23)
CO2: 23 mmol/L (ref 22–32)
Calcium: 8.6 mg/dL — ABNORMAL LOW (ref 8.9–10.3)
Chloride: 107 mmol/L (ref 98–111)
Creatinine, Ser: 6.24 mg/dL — ABNORMAL HIGH (ref 0.61–1.24)
GFR, Estimated: 9 mL/min — ABNORMAL LOW (ref >60)
Glucose, Bld: 127 mg/dL — ABNORMAL HIGH (ref 70–99)
Potassium: 4.4 mmol/L (ref 3.5–5.1)
Sodium: 143 mmol/L (ref 135–145)

## 2024-05-04 LAB — CBG MONITORING, ED
Glucose-Capillary: 135 mg/dL — ABNORMAL HIGH (ref 70–99)
Glucose-Capillary: 84 mg/dL (ref 70–99)

## 2024-05-04 MED ORDER — TRAZODONE HCL 50 MG PO TABS
25.0000 mg | ORAL_TABLET | Freq: Every evening | ORAL | Status: DC | PRN
Start: 2024-05-04 — End: 2024-05-11
  Administered 2024-05-07 – 2024-05-08 (×2): 25 mg via ORAL
  Filled 2024-05-04 (×2): qty 1

## 2024-05-04 MED ORDER — INSULIN GLARGINE-YFGN 100 UNIT/ML ~~LOC~~ SOLN
5.0000 [IU] | Freq: Every day | SUBCUTANEOUS | Status: DC
  Administered 2024-05-05 – 2024-05-10 (×7): 5 [IU] via SUBCUTANEOUS
  Filled 2024-05-04 (×8): qty 0.05

## 2024-05-04 MED ORDER — MAGNESIUM HYDROXIDE 400 MG/5ML PO SUSP
30.0000 mL | Freq: Every day | ORAL | Status: DC | PRN
Start: 2024-05-04 — End: 2024-05-04

## 2024-05-04 MED ORDER — ENOXAPARIN SODIUM 30 MG/0.3ML IJ SOSY
30.0000 mg | PREFILLED_SYRINGE | INTRAMUSCULAR | Status: DC
  Administered 2024-05-05 – 2024-05-11 (×7): 30 mg via SUBCUTANEOUS
  Filled 2024-05-04 (×7): qty 0.3

## 2024-05-04 MED ORDER — ONDANSETRON HCL 4 MG PO TABS
4.0000 mg | ORAL_TABLET | Freq: Four times a day (QID) | ORAL | Status: DC | PRN
Start: 2024-05-04 — End: 2024-05-11

## 2024-05-04 MED ORDER — ACETAMINOPHEN 325 MG PO TABS
650.0000 mg | ORAL_TABLET | Freq: Four times a day (QID) | ORAL | Status: DC | PRN
  Administered 2024-05-10 – 2024-05-11 (×3): 650 mg via ORAL
  Filled 2024-05-04 (×2): qty 2

## 2024-05-04 MED ORDER — FUROSEMIDE 40 MG PO TABS: 80.0000 mg | ORAL_TABLET | Freq: Every day | ORAL | Status: DC | PRN

## 2024-05-04 MED ORDER — ONDANSETRON HCL 4 MG/2ML IJ SOLN: 4.0000 mg | Freq: Four times a day (QID) | INTRAMUSCULAR | Status: DC | PRN

## 2024-05-04 MED ORDER — DIPHENOXYLATE-ATROPINE 2.5-0.025 MG PO TABS
1.0000 | ORAL_TABLET | Freq: Four times a day (QID) | ORAL | Status: DC
  Administered 2024-05-05 – 2024-05-06 (×5): 1 via ORAL
  Filled 2024-05-04 (×5): qty 1

## 2024-05-04 MED ORDER — CARVEDILOL 3.125 MG PO TABS
3.1250 mg | ORAL_TABLET | Freq: Two times a day (BID) | ORAL | Status: DC
  Administered 2024-05-05 – 2024-05-11 (×11): 3.125 mg via ORAL
  Filled 2024-05-04 (×11): qty 1

## 2024-05-04 MED ORDER — FLUTICASONE PROPIONATE 50 MCG/ACT NA SUSP
2.0000 | Freq: Every day | NASAL | Status: DC
  Administered 2024-05-05 – 2024-05-11 (×7): 2 via NASAL
  Filled 2024-05-04: qty 16

## 2024-05-04 MED ORDER — AMLODIPINE BESYLATE 5 MG PO TABS
10.0000 mg | ORAL_TABLET | Freq: Every day | ORAL | Status: DC | PRN
  Administered 2024-05-10: 10 mg via ORAL
  Filled 2024-05-04: qty 2

## 2024-05-04 MED ORDER — ACETAMINOPHEN 650 MG RE SUPP: 650.0000 mg | Freq: Four times a day (QID) | RECTAL | Status: DC | PRN

## 2024-05-04 MED ORDER — OXYBUTYNIN CHLORIDE ER 5 MG PO TB24
10.0000 mg | ORAL_TABLET | Freq: Every day | ORAL | Status: DC
  Administered 2024-05-05 – 2024-05-11 (×7): 10 mg via ORAL
  Filled 2024-05-04 (×7): qty 2

## 2024-05-04 MED ORDER — GABAPENTIN 400 MG PO CAPS
400.0000 mg | ORAL_CAPSULE | Freq: Two times a day (BID) | ORAL | Status: DC
  Administered 2024-05-05 – 2024-05-06 (×4): 400 mg via ORAL
  Filled 2024-05-04 (×4): qty 1

## 2024-05-04 MED ORDER — ALBUTEROL SULFATE HFA 108 (90 BASE) MCG/ACT IN AERS: 2.0000 | INHALATION_SPRAY | RESPIRATORY_TRACT | Status: DC | PRN

## 2024-05-04 MED ORDER — ALBUTEROL SULFATE (2.5 MG/3ML) 0.083% IN NEBU: 2.5000 mg | INHALATION_SOLUTION | RESPIRATORY_TRACT | Status: DC | PRN

## 2024-05-04 MED ORDER — SODIUM BICARBONATE 650 MG PO TABS
650.0000 mg | ORAL_TABLET | Freq: Three times a day (TID) | ORAL | Status: DC
  Administered 2024-05-05 – 2024-05-06 (×5): 650 mg via ORAL
  Filled 2024-05-04 (×5): qty 1

## 2024-05-04 MED ORDER — MORPHINE SULFATE 15 MG PO TABS: 30.0000 mg | ORAL_TABLET | Freq: Two times a day (BID) | ORAL | Status: DC | PRN

## 2024-05-04 MED ORDER — HYDROXYZINE HCL 25 MG PO TABS
25.0000 mg | ORAL_TABLET | Freq: Every day | ORAL | Status: DC
  Administered 2024-05-05 – 2024-05-06 (×2): 25 mg via ORAL
  Filled 2024-05-04 (×2): qty 1

## 2024-05-04 NOTE — ED Provider Notes (Signed)
  EMERGENCY DEPARTMENT AT [facility] Provider Note  CSN: [id number] Arrival date & time: 05/04/24 1547  Chief Complaint(s) Loss of Consciousness  HPI Curtis Hartman is a 63 y.o. male history of CKD, COPD, end-stage renal disease presenting to the emergency department with weakness.  Patient reports I go in and out.  Patient reports he was unable to get dialysis today due to feeling so weak.  He called paramedics, paramedics found patient unconscious although with stimulation improved back to normal.  Denies fevers, chills.  Reports acute on chronic diarrhea, some abdominal cramping.  Denies rectal pain.  No painful urination.  No cough, runny nose, sore throat.  Denies any falls or injuries.  Denies any headaches.  Did have his morphine  increased from 15 to 30 mg apparently recently and just started this.   Past Medical History Past Medical History:  Diagnosis Date   Arthritis    CKD (chronic kidney disease)    COPD (chronic obstructive pulmonary disease) (HCC)    Diabetes mellitus without complication (HCC)    type 2   GERD (gastroesophageal reflux disease)    Headache    Hx Migraines   Hepatitis    Hypertension    Neuropathy    Patient Active Problem List   Diagnosis Date Noted   Acute metabolic encephalopathy 05/04/2024   Coping style affecting medical condition 09/17/2023   Lumbar stenosis 09/11/2023   Lumbar spinal stenosis 09/01/2023   COPD GOLD ? 06/10/2023   Cigarette smoker 06/10/2023   S/P cervical spinal fusion 11/05/2022   Chronic hepatitis C (HCC) 06/25/2022   IBS (irritable bowel syndrome) 06/25/2022   GERD (gastroesophageal reflux disease) 06/25/2022   Impaired mobility and ADLs 11/05/2021   CKD (chronic kidney disease) stage 3, GFR 30-59 ml/min (HCC) 10/29/2021   Hypertension 10/29/2021   T2DM (type 2 diabetes mellitus) (HCC) 10/29/2021   Cervical myelopathy with cervical radiculopathy (HCC) 09/23/2021   Home Medication(s) Prior to  Admission medications   Medication Sig Start Date End Date Taking? Authorizing Provider  acetaminophen  (TYLENOL ) 325 MG tablet Take 1-2 tablets (325-650 mg total) by mouth every 4 (four) hours as needed for mild pain (pain score 1-3). 09/24/23  Yes Setzer, Sandra J, PA-C  albuterol  (PROAIR  HFA) 108 (90 Base) MCG/ACT inhaler 2 puffs every 4 hours as needed only  if your can't catch your breath 06/10/23  Yes Darlean Ozell NOVAK, MD  amLODipine  (NORVASC ) 10 MG tablet Take 10 mg by mouth daily as needed (diastolic blood pressure above 90).   Yes [provider]  Budeson-Glycopyrrol-Formoterol  (BREZTRI  AEROSPHERE) 160-9-4.8 MCG/ACT AERO Inhale 2 puffs into the lungs in the morning and at bedtime. Patient taking differently: Inhale 2 puffs into the lungs 2 (two) times daily as needed (shortness of breath). 06/10/23  Yes Darlean Ozell NOVAK, MD  carvedilol  (COREG ) 3.125 MG tablet Take 1 tablet (3.125 mg total) by mouth 2 (two) times daily with a meal. 09/24/23  Yes Setzer, Nena PARAS, PA-C  diphenoxylate-atropine (LOMOTIL) 2.5-0.025 MG tablet Take 1 tablet by mouth 4 (four) times daily.   Yes [provider]  fluticasone  (FLONASE ) 50 MCG/ACT nasal spray Place 2 sprays into both nostrils daily. 09/24/23  Yes Setzer, Nena PARAS, PA-C  furosemide (LASIX) 80 MG tablet Take 80 mg by mouth daily as needed for edema or fluid. 01/20/24  Yes [provider]  gabapentin  (NEURONTIN ) 400 MG capsule Take 400 mg by mouth 2 (two) times daily. 03/08/24  Yes [provider]  hydrOXYzine (ATARAX)  25 MG tablet Take 25 mg by mouth daily. 04/21/24  Yes [provider]  LANTUS  100 UNIT/ML injection Inject 5 Units into the skin at bedtime as needed (blood sugar over 150). 03/05/21  Yes [provider]  morphine  (MSIR) 30 MG tablet Take 30 mg by mouth 2 (two) times daily as needed for moderate pain (pain score 4-6).   Yes [provider]  oxybutynin (DITROPAN-XL) 10 MG 24 hr tablet Take 10  mg by mouth daily. 02/23/24  Yes [provider]  sodium bicarbonate  650 MG tablet Take 1 tablet (650 mg total) by mouth 3 (three) times daily. 09/24/23  Yes Setzer, Nena PARAS, PA-C                                                                                                                                    Past Surgical History Past Surgical History:  Procedure Laterality Date   ANTERIOR CERVICAL DECOMP/DISCECTOMY FUSION N/A 11/05/2022   Procedure: Anterior Cervical Decompression/discectomy Fusion Cervical Four-Cervical Five;  Surgeon: Debby Dorn MATSU, MD;  Location: [facility] OR;  Service: Neurosurgery;  Laterality: N/A;  3C   ANTERIOR LAT LUMBAR FUSION N/A 09/01/2023   Procedure: DIRECT LUMBAR INTERBODY FUSION, LUMBAR TWO-LUMBAR THREE, LUMBAR THREE-LUMBAR FOUR, LUMBAR FOUR-LUMBAR FIVE, RIGHT PRONE TRANSPSOAS, LUMBAR TWO LUMBAR FIVE POSTERIOR PERCUTANEOUS FUSION LUMBAR FOUR-LUMBAR FIVE MINIMALLY INVASIVE LAMINECTOMY, FORAMINOTOMY;  Surgeon: Debby Dorn MATSU, MD;  Location: [facility] OR;  Service: Neurosurgery;  Laterality: N/A;   AV FISTULA PLACEMENT Left 09/10/2023   Procedure: LEFT ARM ARTERIOVENOUS (AV) FISTULA CREATION;  Surgeon: Pearline Norman RAMAN, MD;  Location: [facility] OR;  Service: Vascular;  Laterality: Left;   BACK SURGERY  2023   Cervical Spine   BIOPSY  03/03/2022   Procedure: BIOPSY;  Surgeon: Cindie Carlin POUR, DO;  Location: [facility] ENDO SUITE;  Service: Endoscopy;;   COLONOSCOPY WITH PROPOFOL  N/A 03/03/2022   Procedure: COLONOSCOPY WITH PROPOFOL ;  Surgeon: Cindie Carlin POUR, DO;  Location: [facility] SUITE;  Service: Endoscopy;  Laterality: N/A;  8:00am   ESOPHAGOGASTRODUODENOSCOPY (EGD) WITH PROPOFOL  N/A 03/03/2022   Procedure: ESOPHAGOGASTRODUODENOSCOPY (EGD) WITH PROPOFOL ;  Surgeon: Cindie Carlin POUR, DO;  Location: [facility] SUITE;  Service: Endoscopy;  Laterality: N/A;   foot right partial toe amputation Right    FOOT SURGERY Right    x3   hernia surgery x4     LUMBAR PERCUTANEOUS  PEDICLE SCREW 3 LEVEL N/A 09/01/2023   Procedure: L2-5 LUMBAR PERCUTANEOUS PEDICLE SCREW 3 LEVEL;  Surgeon: Debby Dorn MATSU, MD;  Location: [facility] OR;  Service: Neurosurgery;  Laterality: N/A;   POLYPECTOMY  03/03/2022   Procedure: POLYPECTOMY;  Surgeon: Cindie Carlin POUR, DO;  Location: [facility] SUITE;  Service: Endoscopy;;   Family History Family History  Problem Relation Age of Onset   Heart disease Mother    Seizures Father    Stroke Maternal Grandfather    Liver disease Paternal Grandmother  Social History Social History   Tobacco Use   Smoking status: Every Day    Current packs/day: 0.25    Types: Cigarettes    Passive exposure: Never   Smokeless tobacco: Never   Tobacco comments:    Pt smokes a couple cigarettes a day (as of 08/26/23)  Vaping Use   Vaping status: Never Used  Substance Use Topics   Alcohol use: Not Currently   Drug use: Not Currently   Allergies Patient has no known allergies.  Review of Systems Review of Systems  All other systems reviewed and are negative.   Physical Exam Vital Signs  I have reviewed the triage vital signs BP 134/87   Pulse 80   Temp 99.3 F (37.4 C) (Rectal)   Resp 19   Wt 72.6 kg   SpO2 96%   BMI 23.63 kg/m  Physical Exam Vitals and nursing note reviewed.  Constitutional:      General: He is not in acute distress.    Appearance: Normal appearance.  HENT:     Mouth/Throat:     Mouth: Mucous membranes are moist.  Eyes:     Conjunctiva/sclera: Conjunctivae normal.  Cardiovascular:     Rate and Rhythm: Normal rate and regular rhythm.  Pulmonary:     Effort: Pulmonary effort is normal. No respiratory distress.     Breath sounds: Normal breath sounds.  Abdominal:     General: Abdomen is flat.     Palpations: Abdomen is soft.     Tenderness: There is no abdominal tenderness.  Genitourinary:    Comments: Chaperoned by Water engineer.  Possible slight fluctuance to the right buttock but no induration or  erythema. Musculoskeletal:     Right lower leg: No edema.     Left lower leg: No edema.  Skin:    General: Skin is warm and dry.     Capillary Refill: Capillary refill takes less than 2 seconds.  Neurological:     Mental Status: He is alert and oriented to person, place, and time. Mental status is at baseline.  Psychiatric:        Mood and Affect: Mood normal.        Behavior: Behavior normal.     ED Results and Treatments Labs (all labs ordered are listed, but only abnormal results are displayed) Labs Reviewed  BASIC METABOLIC PANEL WITH GFR - Abnormal; Notable for the following components:      Result Value   Glucose, Bld 127 (*)    BUN 52 (*)    Creatinine, Ser 6.24 (*)    Calcium  8.6 (*)    GFR, Estimated 9 (*)    All other components within normal limits  CBC WITH DIFFERENTIAL/PLATELET - Abnormal; Notable for the following components:   WBC 11.2 (*)    RBC 4.01 (*)    Hemoglobin 12.1 (*)    HCT 37.6 (*)    Neutro Abs 9.4 (*)    All other components within normal limits  HEPATIC FUNCTION PANEL - Abnormal; Notable for the following components:   Albumin  3.2 (*)    AST 14 (*)    All other components within normal limits  CBG MONITORING, ED - Abnormal; Notable for the following components:   Glucose-Capillary 135 (*)    All other components within normal limits  GASTROINTESTINAL PANEL BY PCR, STOOL (REPLACES STOOL CULTURE)  HIV ANTIBODY (ROUTINE TESTING W REFLEX)  BASIC METABOLIC PANEL WITH GFR  CBC  CBG MONITORING, ED  Radiology CT ABDOMEN PELVIS WO CONTRAST Result Date: 05/04/2024 CLINICAL DATA:  Abdominal pain. EXAM: CT ABDOMEN AND PELVIS WITHOUT CONTRAST TECHNIQUE: Multidetector CT imaging of the abdomen and pelvis was performed following the standard protocol without IV contrast. RADIATION DOSE REDUCTION: This exam was performed according to  the departmental dose-optimization program which includes automated exposure control, adjustment of the mA and/or kV according to patient size and/or use of iterative reconstruction technique. COMPARISON:  None Available. FINDINGS: Evaluation of this exam is limited in the absence of intravenous contrast. Evaluation is also limited due to streak artifact caused by patient's arms and spinal hardware. Lower chest: The visualized lung bases are clear. There is coronary vascular calcification. No intra-abdominal free air or free fluid. Hepatobiliary: The liver is unremarkable. No biliary dilatation. The gallbladder is unremarkable. Pancreas: Unremarkable. No pancreatic ductal dilatation or surrounding inflammatory changes. Spleen: Normal in size without focal abnormality. Adrenals/Urinary Tract: The right adrenal gland is unremarkable. A 2 cm left adrenal adenoma. There is no hydronephrosis or nephrolithiasis on either side. The visualized ureters and urinary bladder appear unremarkable. Stomach/Bowel: There is no bowel obstruction or active inflammation. The appendix is normal. Vascular/Lymphatic: Moderate aortoiliac atherosclerotic disease. The IVC is unremarkable. No portal venous gas. There is no adenopathy. Reproductive: The prostate and seminal vesicles are grossly unremarkable. No pelvic mass. Other: Partially visualized 2.5 x 5.0 cm indurated area in the subcutaneous soft tissues of the right buttock adjacent to the intergluteal cleft suspicious for a perianal collection or developing abscess. Ultrasound may provide better evaluation. Musculoskeletal: Osteopenia with degenerative changes of the spine. Multilevel posterior fusion hardware. No acute osseous pathology. IMPRESSION: 1. No hydronephrosis or nephrolithiasis. 2. No bowel obstruction. Normal appendix. 3. Right perianal collection or developing abscess. Ultrasound may provide better evaluation. 4.  Aortic Atherosclerosis (ICD10-I70.0). Electronically  Signed   By: Vanetta Chou M.D.   On: 05/04/2024 17:44   DG Chest Port 1 View Result Date: 05/04/2024 CLINICAL DATA:  106001 Syncope 106001 EXAM: PORTABLE CHEST - 1 VIEW COMPARISON:  None available. FINDINGS: No focal airspace consolidation, pleural effusion, or pneumothorax. No cardiomegaly. Aortic atherosclerosis. No acute fracture or destructive lesions. Multilevel thoracic osteophytosis. Partially visualized cervical fusion hardware IMPRESSION: No acute cardiopulmonary abnormality. Electronically Signed   By: Rogelia Myers M.D.   On: 05/04/2024 16:32    Pertinent labs & imaging results that were available during my care of the patient were reviewed by me and considered in my medical decision making (see MDM for details).  Medications Ordered in ED Medications  morphine  (MSIR) tablet 30 mg (has no administration in time range)  amLODipine  (NORVASC ) tablet 10 mg (has no administration in time range)  carvedilol  (COREG ) tablet 3.125 mg (has no administration in time range)  furosemide (LASIX) tablet 80 mg (has no administration in time range)  hydrOXYzine (ATARAX) tablet 25 mg (has no administration in time range)  insulin  glargine-yfgn (SEMGLEE ) injection 5 Units (has no administration in time range)  diphenoxylate-atropine (LOMOTIL) 2.5-0.025 MG per tablet 1 tablet (has no administration in time range)  sodium bicarbonate  tablet 650 mg (has no administration in time range)  oxybutynin (DITROPAN-XL) 24 hr tablet 10 mg (has no administration in time range)  gabapentin  (NEURONTIN ) capsule 400 mg (has no administration in time range)  fluticasone  (FLONASE ) 50 MCG/ACT nasal spray 2 spray (has no administration in time range)  enoxaparin (LOVENOX) injection 30 mg (has no administration in time range)  acetaminophen  (TYLENOL ) tablet 650 mg (has no administration in time range)  Or  acetaminophen  (TYLENOL ) suppository 650 mg (has no administration in time range)  traZODone (DESYREL) tablet  25 mg (has no administration in time range)  ondansetron  (ZOFRAN ) tablet 4 mg (has no administration in time range)    Or  ondansetron  (ZOFRAN ) injection 4 mg (has no administration in time range)  albuterol  (PROVENTIL ) (2.5 MG/3ML) 0.083% nebulizer solution 2.5 mg (has no administration in time range)                                                                                                                                     Procedures Procedures  (including critical care time)  Medical Decision Making / ED Course   MDM:  63 year old presenting with weakness.  Patient presenting with weakness, apparent episodes of loss of consciousness.  Currently seems alert and oriented.  No focal findings on physical examination.  Given abdominal pain obtained a CT abdomen which shows possible gluteal fluid collection, on exam, he has some small fluctuance in this area but no tenderness, erythema or warmth to suggest acute infection.  May need ultrasound as recommended by radiology.  Laboratory testing without signs of obvious process, shows findings of ESRD.  Could potentially be related to increase in morphine  dosage, given renal dysfunction this would cause decreased metabolism and longer-lasting metabolite.  Patient attempted to get out of bed but could not ambulate.  Given his significant weakness from baseline will need admission for further workup as inpatient.  Discussed with Dr. Lawence        Additional history obtained: -Additional history obtained from ems -External records from outside source obtained and reviewed including: Chart review including previous notes, labs, imaging, consultation notes including prior notes    Lab Tests: -I ordered, reviewed, and interpreted labs.   The pertinent results include:   Labs Reviewed  BASIC METABOLIC PANEL WITH GFR - Abnormal; Notable for the following components:      Result Value   Glucose, Bld 127 (*)    BUN 52 (*)    Creatinine,  Ser 6.24 (*)    Calcium  8.6 (*)    GFR, Estimated 9 (*)    All other components within normal limits  CBC WITH DIFFERENTIAL/PLATELET - Abnormal; Notable for the following components:   WBC 11.2 (*)    RBC 4.01 (*)    Hemoglobin 12.1 (*)    HCT 37.6 (*)    Neutro Abs 9.4 (*)    All other components within normal limits  HEPATIC FUNCTION PANEL - Abnormal; Notable for the following components:   Albumin  3.2 (*)    AST 14 (*)    All other components within normal limits  CBG MONITORING, ED - Abnormal; Notable for the following components:   Glucose-Capillary 135 (*)    All other components within normal limits  GASTROINTESTINAL PANEL BY PCR, STOOL (REPLACES STOOL CULTURE)  HIV ANTIBODY (ROUTINE TESTING W REFLEX)  BASIC METABOLIC PANEL  WITH GFR  CBC  CBG MONITORING, ED    Notable for mild leukocytosis, ESRD  EKG   EKG Interpretation Date/Time:  Wednesday May 04 2024 16:11:31 EDT Ventricular Rate:  82 PR Interval:  178 QRS Duration:  97 QT Interval:  414 QTC Calculation: 484 R Axis:   -69  Text Interpretation: Sinus rhythm Probable left atrial enlargement Left anterior fascicular block Left ventricular hypertrophy Anterior infarct, old Confirmed by Francesca Fallow ([id number]) on 05/04/2024 4:54:29 PM         Imaging Studies ordered: I ordered imaging studies including CT scan  On my interpretation imaging demonstrates ?gluteal collection  I independently visualized and interpreted imaging. I agree with the radiologist interpretation   Medicines ordered and prescription drug management: Meds ordered this encounter  Medications   morphine  (MSIR) tablet 30 mg    Refill:  0   amLODipine  (NORVASC ) tablet 10 mg   carvedilol  (COREG ) tablet 3.125 mg   furosemide (LASIX) tablet 80 mg   hydrOXYzine (ATARAX) tablet 25 mg   insulin  glargine-yfgn (SEMGLEE ) injection 5 Units   diphenoxylate-atropine (LOMOTIL) 2.5-0.025 MG per tablet 1 tablet   sodium bicarbonate  tablet 650 mg    oxybutynin (DITROPAN-XL) 24 hr tablet 10 mg   gabapentin  (NEURONTIN ) capsule 400 mg   DISCONTD: albuterol  (VENTOLIN  HFA) 108 (90 Base) MCG/ACT inhaler 2 puff    2 puffs every 4 hours as needed only  if your can't catch your breath     fluticasone  (FLONASE ) 50 MCG/ACT nasal spray 2 spray   enoxaparin (LOVENOX) injection 30 mg   OR Linked Order Group    acetaminophen  (TYLENOL ) tablet 650 mg    acetaminophen  (TYLENOL ) suppository 650 mg   traZODone (DESYREL) tablet 25 mg   DISCONTD: magnesium hydroxide (MILK OF MAGNESIA) suspension 30 mL   OR Linked Order Group    ondansetron  (ZOFRAN ) tablet 4 mg    ondansetron  (ZOFRAN ) injection 4 mg   albuterol  (PROVENTIL ) (2.5 MG/3ML) 0.083% nebulizer solution 2.5 mg    -I have reviewed the patients home medicines and have made adjustments as needed   Consultations Obtained: I requested consultation with the hospitalist,  and discussed lab and imaging findings as well as pertinent plan - they recommend: admission   Cardiac Monitoring: The patient was maintained on a cardiac monitor.  I personally viewed and interpreted the cardiac monitored which showed an underlying rhythm of: NSR  Social Determinants of Health:  Diagnosis or treatment significantly limited by social determinants of health: obesity   Reevaluation: After the interventions noted above, I reevaluated the patient and found that their symptoms have improved  Co morbidities that complicate the patient evaluation  Past Medical History:  Diagnosis Date   Arthritis    CKD (chronic kidney disease)    COPD (chronic obstructive pulmonary disease) (HCC)    Diabetes mellitus without complication (HCC)    type 2   GERD (gastroesophageal reflux disease)    Headache    Hx Migraines   Hepatitis    Hypertension    Neuropathy       Dispostion: Disposition decision including need for hospitalization was considered, and patient admitted to the hospital.    Final Clinical  Impression(s) / ED Diagnoses Final diagnoses:  Generalized weakness     This chart was dictated using voice recognition software.  Despite best efforts to proofread,  errors can occur which can change the documentation meaning.    Francesca Fallow CROME, MD 05/04/24 [phone]

## 2024-05-04 NOTE — H&P (Incomplete)
 [place]   PATIENT NAME: Curtis Hartman    MR#:  [id number]  DATE OF BIRTH:  12/26/1960  DATE OF ADMISSION:  05/04/2024  PRIMARY CARE PHYSICIAN: Gerome Tillman CROME, FNP   Patient is coming from: Home  REQUESTING/REFERRING PHYSICIAN: Francesca Fallow, MD  CHIEF COMPLAINT:   Chief Complaint  Patient presents with  . Loss of Consciousness    HISTORY OF PRESENT ILLNESS:  Curtis Hartman is a 63 y.o. male with medical history significant for end-stage renal disease on hemodialysis on Monday Wednesday Fridays, COPD, type 2 diabetes mellitus, GERD, osteoarthritis, hypertension, chronic hepatitis C and peripheral neuropathy, who presented to the emergency room with acute onset of generalized weakness while he was in dialysis ED Course: *** EKG as reviewed by me : *** Imaging: *** PAST MEDICAL HISTORY:   Past Medical History:  Diagnosis Date  . Arthritis   . CKD (chronic kidney disease)   . COPD (chronic obstructive pulmonary disease) (HCC)   . Diabetes mellitus without complication (HCC)    type 2  . GERD (gastroesophageal reflux disease)   . Headache    Hx Migraines  . Hepatitis   . Hypertension   . Neuropathy     PAST SURGICAL HISTORY:   Past Surgical History:  Procedure Laterality Date  . ANTERIOR CERVICAL DECOMP/DISCECTOMY FUSION N/A 11/05/2022   Procedure: Anterior Cervical Decompression/discectomy Fusion Cervical Four-Cervical Five;  Surgeon: Debby Dorn MATSU, MD;  Location: [facility] OR;  Service: Neurosurgery;  Laterality: N/A;  3C  . ANTERIOR LAT LUMBAR FUSION N/A 09/01/2023   Procedure: DIRECT LUMBAR INTERBODY FUSION, LUMBAR TWO-LUMBAR THREE, LUMBAR THREE-LUMBAR FOUR, LUMBAR FOUR-LUMBAR FIVE, RIGHT PRONE TRANSPSOAS, LUMBAR TWO LUMBAR FIVE POSTERIOR PERCUTANEOUS FUSION LUMBAR FOUR-LUMBAR FIVE MINIMALLY INVASIVE LAMINECTOMY, FORAMINOTOMY;  Surgeon: Debby Dorn MATSU, MD;  Location: [facility] OR;  Service: Neurosurgery;  Laterality: N/A;  . AV FISTULA PLACEMENT Left  09/10/2023   Procedure: LEFT ARM ARTERIOVENOUS (AV) FISTULA CREATION;  Surgeon: Pearline Norman RAMAN, MD;  Location: [facility] OR;  Service: Vascular;  Laterality: Left;  . BACK SURGERY  2023   Cervical Spine  . BIOPSY  03/03/2022   Procedure: BIOPSY;  Surgeon: Cindie Carlin POUR, DO;  Location: [facility] SUITE;  Service: Endoscopy;;  . COLONOSCOPY WITH PROPOFOL  N/A 03/03/2022   Procedure: COLONOSCOPY WITH PROPOFOL ;  Surgeon: Cindie Carlin POUR, DO;  Location: [facility];  Service: Endoscopy;  Laterality: N/A;  8:00am  . ESOPHAGOGASTRODUODENOSCOPY (EGD) WITH PROPOFOL  N/A 03/03/2022   Procedure: ESOPHAGOGASTRODUODENOSCOPY (EGD) WITH PROPOFOL ;  Surgeon: Cindie Carlin POUR, DO;  Location: [facility];  Service: Endoscopy;  Laterality: N/A;  . foot right partial toe amputation Right   . FOOT SURGERY Right    x3  . hernia surgery x4    . LUMBAR PERCUTANEOUS PEDICLE SCREW 3 LEVEL N/A 09/01/2023   Procedure: L2-5 LUMBAR PERCUTANEOUS PEDICLE SCREW 3 LEVEL;  Surgeon: Debby Dorn MATSU, MD;  Location: [facility] OR;  Service: Neurosurgery;  Laterality: N/A;  . POLYPECTOMY  03/03/2022   Procedure: POLYPECTOMY;  Surgeon: Cindie Carlin POUR, DO;  Location: [facility];  Service: Endoscopy;;    SOCIAL HISTORY:   Social History   Tobacco Use  . Smoking status: Every Day    Current packs/day: 0.25    Types: Cigarettes    Passive exposure: Never  . Smokeless tobacco: Never  . Tobacco comments:    Pt smokes a couple cigarettes a day (as of 08/26/23)  Substance Use Topics  . Alcohol use: Not Currently  FAMILY HISTORY:   Family History  Problem Relation Age of Onset  . Heart disease Mother   . Seizures Father   . Stroke Maternal Grandfather   . Liver disease Paternal Grandmother     DRUG ALLERGIES:  No Known Allergies  REVIEW OF SYSTEMS:   ROS As per history of present illness. All pertinent systems were reviewed above. Constitutional, HEENT, cardiovascular, respiratory, GI, GU, musculoskeletal,  neuro, psychiatric, endocrine, integumentary and hematologic systems were reviewed and are otherwise negative/unremarkable except for positive findings mentioned above in the HPI.   MEDICATIONS AT HOME:   Prior to Admission medications   Medication Sig Start Date End Date Taking? Authorizing Provider  acetaminophen  (TYLENOL ) 325 MG tablet Take 1-2 tablets (325-650 mg total) by mouth every 4 (four) hours as needed for mild pain (pain score 1-3). 09/24/23  Yes Setzer, Nena PARAS, PA-C  albuterol  (PROAIR  HFA) 108 (90 Base) MCG/ACT inhaler 2 puffs every 4 hours as needed only  if your can't catch your breath 06/10/23  Yes Darlean Ozell NOVAK, MD  amLODipine  (NORVASC ) 10 MG tablet Take 10 mg by mouth daily as needed (diastolic blood pressure above 90).   Yes [provider]  Budeson-Glycopyrrol-Formoterol  (BREZTRI  AEROSPHERE) 160-9-4.8 MCG/ACT AERO Inhale 2 puffs into the lungs in the morning and at bedtime. Patient taking differently: Inhale 2 puffs into the lungs 2 (two) times daily as needed (shortness of breath). 06/10/23  Yes Darlean Ozell NOVAK, MD  carvedilol  (COREG ) 3.125 MG tablet Take 1 tablet (3.125 mg total) by mouth 2 (two) times daily with a meal. 09/24/23  Yes Setzer, Nena PARAS, PA-C  diphenoxylate-atropine (LOMOTIL) 2.5-0.025 MG tablet Take 1 tablet by mouth 4 (four) times daily.   Yes [provider]  fluticasone  (FLONASE ) 50 MCG/ACT nasal spray Place 2 sprays into both nostrils daily. 09/24/23  Yes Setzer, Nena PARAS, PA-C  furosemide (LASIX) 80 MG tablet Take 80 mg by mouth daily as needed for edema or fluid. 01/20/24  Yes [provider]  gabapentin  (NEURONTIN ) 400 MG capsule Take 400 mg by mouth 2 (two) times daily. 03/08/24  Yes [provider]  hydrOXYzine (ATARAX) 25 MG tablet Take 25 mg by mouth daily. 04/21/24  Yes [provider]  LANTUS  100 UNIT/ML injection Inject 5 Units into the skin at bedtime as needed (blood sugar over 150). 03/05/21  Yes [provider]  morphine  (MSIR) 30 MG tablet Take 30 mg by mouth 2 (two) times daily as needed for moderate pain (pain score 4-6).   Yes [provider]  oxybutynin (DITROPAN-XL) 10 MG 24 hr tablet Take 10 mg by mouth daily. 02/23/24  Yes [provider]  sodium bicarbonate  650 MG tablet Take 1 tablet (650 mg total) by mouth 3 (three) times daily. 09/24/23  Yes Setzer, Nena PARAS, PA-C      VITAL SIGNS:  Blood pressure 134/87, pulse 80, temperature 99.3 F (37.4 C), temperature source Rectal, resp. rate 19, weight 72.6 kg, SpO2 96%.  PHYSICAL EXAMINATION:  Physical Exam  GENERAL:  63 y.o.-year-old patient lying in the bed with no acute distress.  EYES: Pupils equal, round, reactive to light and accommodation. No scleral icterus. Extraocular muscles intact.  HEENT: Head atraumatic, normocephalic. Oropharynx and nasopharynx clear.  NECK:  Supple, no jugular venous distention. No thyroid enlargement, no tenderness.  LUNGS: Normal breath sounds bilaterally, no wheezing, rales,rhonchi or crepitation. No use of accessory muscles of respiration.  CARDIOVASCULAR: Regular rate and rhythm, S1, S2 normal. No murmurs, rubs, or  gallops.  ABDOMEN: Soft, nondistended, nontender. Bowel sounds present. No organomegaly or mass.  EXTREMITIES: No pedal edema, cyanosis, or clubbing.  NEUROLOGIC: Cranial nerves II through XII are intact. Muscle strength 5/5 in all extremities. Sensation intact. Gait not checked.  PSYCHIATRIC: The patient is alert and oriented x 3.  Normal affect and good eye contact. SKIN: No obvious rash, lesion, or ulcer.   LABORATORY PANEL:   CBC Recent Labs  Lab 05/04/24 1606  WBC 11.2*  HGB 12.1*  HCT 37.6*  PLT 264   ------------------------------------------------------------------------------------------------------------------  Chemistries  Recent Labs  Lab 05/04/24 1606  NA 143  K 4.4  CL 107  CO2 23  GLUCOSE 127*  BUN 52*  CREATININE 6.24*   CALCIUM  8.6*  AST 14*  ALT 9  ALKPHOS 52  BILITOT 0.6   ------------------------------------------------------------------------------------------------------------------  Cardiac Enzymes No results for input(s): TROPONINI in the last 168 hours. ------------------------------------------------------------------------------------------------------------------  RADIOLOGY:  CT ABDOMEN PELVIS WO CONTRAST Result Date: 05/04/2024 CLINICAL DATA:  Abdominal pain. EXAM: CT ABDOMEN AND PELVIS WITHOUT CONTRAST TECHNIQUE: Multidetector CT imaging of the abdomen and pelvis was performed following the standard protocol without IV contrast. RADIATION DOSE REDUCTION: This exam was performed according to the departmental dose-optimization program which includes automated exposure control, adjustment of the mA and/or kV according to patient size and/or use of iterative reconstruction technique. COMPARISON:  None Available. FINDINGS: Evaluation of this exam is limited in the absence of intravenous contrast. Evaluation is also limited due to streak artifact caused by patient's arms and spinal hardware. Lower chest: The visualized lung bases are clear. There is coronary vascular calcification. No intra-abdominal free air or free fluid. Hepatobiliary: The liver is unremarkable. No biliary dilatation. The gallbladder is unremarkable. Pancreas: Unremarkable. No pancreatic ductal dilatation or surrounding inflammatory changes. Spleen: Normal in size without focal abnormality. Adrenals/Urinary Tract: The right adrenal gland is unremarkable. A 2 cm left adrenal adenoma. There is no hydronephrosis or nephrolithiasis on either side. The visualized ureters and urinary bladder appear unremarkable. Stomach/Bowel: There is no bowel obstruction or active inflammation. The appendix is normal. Vascular/Lymphatic: Moderate aortoiliac atherosclerotic disease. The IVC is unremarkable. No portal venous gas. There is no adenopathy.  Reproductive: The prostate and seminal vesicles are grossly unremarkable. No pelvic mass. Other: Partially visualized 2.5 x 5.0 cm indurated area in the subcutaneous soft tissues of the right buttock adjacent to the intergluteal cleft suspicious for a perianal collection or developing abscess. Ultrasound may provide better evaluation. Musculoskeletal: Osteopenia with degenerative changes of the spine. Multilevel posterior fusion hardware. No acute osseous pathology. IMPRESSION: 1. No hydronephrosis or nephrolithiasis. 2. No bowel obstruction. Normal appendix. 3. Right perianal collection or developing abscess. Ultrasound may provide better evaluation. 4.  Aortic Atherosclerosis (ICD10-I70.0). Electronically Signed   By: Vanetta Chou M.D.   On: 05/04/2024 17:44   DG Chest Port 1 View Result Date: 05/04/2024 CLINICAL DATA:  106001 Syncope 106001 EXAM: PORTABLE CHEST - 1 VIEW COMPARISON:  None available. FINDINGS: No focal airspace consolidation, pleural effusion, or pneumothorax. No cardiomegaly. Aortic atherosclerosis. No acute fracture or destructive lesions. Multilevel thoracic osteophytosis. Partially visualized cervical fusion hardware IMPRESSION: No acute cardiopulmonary abnormality. Electronically Signed   By: Rogelia Myers M.D.   On: 05/04/2024 16:32      IMPRESSION AND PLAN:  Assessment and Plan: No notes have been filed under this hospital service. Service: Hospitalist      DVT prophylaxis: Lovenox***  Advanced Care Planning:  Code Status: full code***  Family Communication:  The plan of  care was discussed in details with the patient (and family). I answered all questions. The patient agreed to proceed with the above mentioned plan. Further management will depend upon hospital course. Disposition Plan: Back to previous home environment Consults called: none***  All the records are reviewed and case discussed with ED provider.  Status is: Observation {Observation:23811}   At  the time of the admission, it appears that the appropriate admission status for this patient is inpatient.  This is judged to be reasonable and necessary in order to provide the required intensity of service to ensure the patient's safety given the presenting symptoms, physical exam findings and initial radiographic and laboratory data in the context of comorbid conditions.  The patient requires inpatient status due to high intensity of service, high risk of further deterioration and high frequency of surveillance required.  I certify that at the time of admission, it is my clinical judgment that the patient will require inpatient hospital care extending more than 2 midnights.                            Dispo: The patient is from: Home              Anticipated d/c is to: Home              Patient currently is not medically stable to d/c.              Difficult to place patient: No  [place] DELENA Peaches M.D on 05/04/2024 at 11:39 PM  [facility]   From 7 PM-7 AM, contact night-coverage [email or web address]  CC: Primary care physician; Gerome Tillman CROME, FNP

## 2024-05-04 NOTE — ED Notes (Signed)
 ED Provider at bedside.

## 2024-05-04 NOTE — ED Notes (Signed)
 Able to sit pt on the side of the bed, but unable to stand. Pt experiencing a lot of tremors in all four extremities. Very poor coordination of hands to try to drink a cup of coffee. Provided lid and straw to facilitate oral hydration and reduce the risk of spill.

## 2024-05-04 NOTE — H&P (Signed)
    PATIENT NAME: Curtis Hartman    MR#:  [id number]  DATE OF BIRTH:  05-Mar-1961  DATE OF ADMISSION:  05/04/2024  PRIMARY CARE PHYSICIAN: Gerome Tillman CROME, FNP   Patient is coming from: Home  REQUESTING/REFERRING PHYSICIAN: Francesca Fallow, MD  CHIEF COMPLAINT:   Chief Complaint  Patient presents with   Loss of Consciousness    HISTORY OF PRESENT ILLNESS:  Curtis Hartman is a 63 y.o. male with medical history significant for end-stage renal disease on hemodialysis on Monday Wednesday Fridays, COPD, type 2 diabetes mellitus, GERD, osteoarthritis, hypertension, chronic hepatitis C and peripheral neuropathy, who presented to the emergency room with acute onset of generalized weakness while he was in dialysis with unresponsiveness/syncope after which the patient was mildly confused.  No witnessed seizures.  He stated that he took 30 mg of p.o. morphine  sulfate this morning rather than 15 mg as his dose was recently increased.  He denied any fever or chills.  No chest pain or dyspnea or cough or wheezing.  No dysuria, urinary frequency or urgency, hematuria or flank pain.  He was unable to stand in the ER due to tremors in all 4 extremities.  He had diarrhea couple days ago which has resolved.  He admits to lower abdominal suprapubic pain.  He continues to smoke 5 cigarettes/day.  No paresthesias or focal muscle weakness.  No headache or dizziness or blurred vision.  No tinnitus or vertigo.  No bleeding diathesis.  ED Course: When he came to the ER, temperature was 99.5 and otherwise vital signs were within normal.  Labs revealed a BUN of 52 and creatinine 6.24, albumin  of 3.2 and CBC showed leukocytosis of 11.2 with neutrophilia and mild anemia.  GI panel was sent by the ED physician.   EKG as reviewed by me : EKG showed normal sinus rhythm with a rate of 82 with probable left atrial enlargement and left intrafascicular block with LVH and Q waves anteriorly.. Imaging:  Abdominal pelvic CT scan without contrast: 1. No hydronephrosis or nephrolithiasis. 2. No bowel obstruction. Normal appendix. 3. Right perianal collection or developing abscess. Ultrasound may provide better evaluation. 4.  Aortic Atherosclerosis.  The patient will be admitted to a medical telemetry observation bed for further evaluation and management. PAST MEDICAL HISTORY:   Past Medical History:  Diagnosis Date   Arthritis    CKD (chronic kidney disease)    COPD (chronic obstructive pulmonary disease) (HCC)    Diabetes mellitus without complication (HCC)    type 2   GERD (gastroesophageal reflux disease)    Headache    Hx Migraines   Hepatitis    Hypertension    Neuropathy     PAST SURGICAL HISTORY:   Past Surgical History:  Procedure Laterality Date   ANTERIOR CERVICAL DECOMP/DISCECTOMY FUSION N/A 11/05/2022   Procedure: Anterior Cervical Decompression/discectomy Fusion Cervical Four-Cervical Five;  Surgeon: Debby Dorn MATSU, MD;  Location: [facility] OR;  Service: Neurosurgery;  Laterality: N/A;  3C   ANTERIOR LAT LUMBAR FUSION N/A 09/01/2023   Procedure: DIRECT LUMBAR INTERBODY FUSION, LUMBAR TWO-LUMBAR THREE, LUMBAR THREE-LUMBAR FOUR, LUMBAR FOUR-LUMBAR FIVE, RIGHT PRONE TRANSPSOAS, LUMBAR TWO LUMBAR FIVE POSTERIOR PERCUTANEOUS FUSION LUMBAR FOUR-LUMBAR FIVE MINIMALLY INVASIVE LAMINECTOMY, FORAMINOTOMY;  Surgeon: Debby Dorn MATSU, MD;  Location: [facility] OR;  Service: Neurosurgery;  Laterality: N/A;   AV FISTULA PLACEMENT Left 09/10/2023   Procedure: LEFT ARM ARTERIOVENOUS (AV) FISTULA CREATION;  Surgeon: Pearline Norman RAMAN, MD;  Location: [facility] OR;  Service: Vascular;  Laterality: Left;   BACK SURGERY  2023   Cervical Spine   BIOPSY  03/03/2022   Procedure: BIOPSY;  Surgeon: Cindie Carlin POUR, DO;  Location: [facility]ITE;  Service: Endoscopy;;   COLONOSCOPY WITH PROPOFOL  N/A 03/03/2022   Procedure: COLONOSCOPY WITH PROPOFOL ;  Surgeon: Cindie Carlin POUR, DO;  Location: [facility];   Service: Endoscopy;  Laterality: N/A;  8:00am   ESOPHAGOGASTRODUODENOSCOPY (EGD) WITH PROPOFOL  N/A 03/03/2022   Procedure: ESOPHAGOGASTRODUODENOSCOPY (EGD) WITH PROPOFOL ;  Surgeon: Cindie Carlin POUR, DO;  Location: [facility];  Service: Endoscopy;  Laterality: N/A;   foot right partial toe amputation Right    FOOT SURGERY Right    x3   hernia surgery x4     LUMBAR PERCUTANEOUS PEDICLE SCREW 3 LEVEL N/A 09/01/2023   Procedure: L2-5 LUMBAR PERCUTANEOUS PEDICLE SCREW 3 LEVEL;  Surgeon: Debby Dorn MATSU, MD;  Location: [facility] OR;  Service: Neurosurgery;  Laterality: N/A;   POLYPECTOMY  03/03/2022   Procedure: POLYPECTOMY;  Surgeon: Cindie Carlin POUR, DO;  Location: [facility];  Service: Endoscopy;;    SOCIAL HISTORY:   Social History   Tobacco Use   Smoking status: Every Day    Current packs/day: 0.25    Types: Cigarettes    Passive exposure: Never   Smokeless tobacco: Never   Tobacco comments:    Pt smokes a couple cigarettes a day (as of 08/26/23)  Substance Use Topics   Alcohol use: Not Currently    FAMILY HISTORY:   Family History  Problem Relation Age of Onset   Heart disease Mother    Seizures Father    Stroke Maternal Grandfather    Liver disease Paternal Grandmother     DRUG ALLERGIES:  No Known Allergies  REVIEW OF SYSTEMS:   ROS As per history of present illness. All pertinent systems were reviewed above. Constitutional, HEENT, cardiovascular, respiratory, GI, GU, musculoskeletal, neuro, psychiatric, endocrine, integumentary and hematologic systems were reviewed and are otherwise negative/unremarkable except for positive findings mentioned above in the HPI.   MEDICATIONS AT HOME:   Prior to Admission medications   Medication Sig Start Date End Date Taking? Authorizing Provider  acetaminophen  (TYLENOL ) 325 MG tablet Take 1-2 tablets (325-650 mg total) by mouth every 4 (four) hours as needed for mild pain (pain score 1-3). 09/24/23  Yes Setzer, Sandra J,  PA-C  albuterol  (PROAIR  HFA) 108 (90 Base) MCG/ACT inhaler 2 puffs every 4 hours as needed only  if your can't catch your breath 06/10/23  Yes Darlean Ozell NOVAK, MD  amLODipine  (NORVASC ) 10 MG tablet Take 10 mg by mouth daily as needed (diastolic blood pressure above 90).   Yes [provider]  Budeson-Glycopyrrol-Formoterol  (BREZTRI  AEROSPHERE) 160-9-4.8 MCG/ACT AERO Inhale 2 puffs into the lungs in the morning and at bedtime. Patient taking differently: Inhale 2 puffs into the lungs 2 (two) times daily as needed (shortness of breath). 06/10/23  Yes Darlean Ozell NOVAK, MD  carvedilol  (COREG ) 3.125 MG tablet Take 1 tablet (3.125 mg total) by mouth 2 (two) times daily with a meal. 09/24/23  Yes Setzer, Nena PARAS, PA-C  diphenoxylate-atropine (LOMOTIL) 2.5-0.025 MG tablet Take 1 tablet by mouth 4 (four) times daily.   Yes [provider]  fluticasone  (FLONASE ) 50 MCG/ACT nasal spray Place 2 sprays into both nostrils daily. 09/24/23  Yes Setzer, Nena PARAS, PA-C  furosemide (LASIX) 80 MG tablet Take 80 mg by mouth daily as needed for edema or fluid. 01/20/24  Yes [provider]  gabapentin  (NEURONTIN ) 400 MG capsule Take 400 mg by mouth 2 (two) times daily. 03/08/24  Yes [provider]  hydrOXYzine (ATARAX) 25 MG tablet Take 25 mg by mouth daily. 04/21/24  Yes [provider]  LANTUS  100 UNIT/ML injection Inject 5 Units into the skin at bedtime as needed (blood sugar over 150). 03/05/21  Yes [provider]  morphine  (MSIR) 30 MG tablet Take 30 mg by mouth 2 (two) times daily as needed for moderate pain (pain score 4-6).   Yes [provider]  oxybutynin (DITROPAN-XL) 10 MG 24 hr tablet Take 10 mg by mouth daily. 02/23/24  Yes [provider]  sodium bicarbonate  650 MG tablet Take 1 tablet (650 mg total) by mouth 3 (three) times daily. 09/24/23  Yes Setzer, Nena PARAS, PA-C      VITAL SIGNS:  Blood pressure (!) 161/73, pulse 77, temperature 98.3 F  (36.8 C), temperature source Oral, resp. rate 15, weight 62.8 kg, SpO2 97%.  PHYSICAL EXAMINATION:  Physical Exam  GENERAL:  63 y.o.-year-old Caucasian male patient lying in the bed with no acute distress.  EYES: Pupils equal, round, reactive to light and accommodation. No scleral icterus. Extraocular muscles intact.  HEENT: Head atraumatic, normocephalic. Oropharynx and nasopharynx clear.  NECK:  Supple, no jugular venous distention. No thyroid enlargement, no tenderness.  LUNGS: Normal breath sounds bilaterally, no wheezing, rales,rhonchi or crepitation. No use of accessory muscles of respiration.  CARDIOVASCULAR: Regular rate and rhythm, S1, S2 normal. No murmurs, rubs, or gallops.  ABDOMEN: Soft, nondistended, nontender. Bowel sounds present. No organomegaly or mass.  EXTREMITIES: No pedal edema, cyanosis, or clubbing.  NEUROLOGIC: Cranial nerves II through XII are intact. Muscle strength 5/5 in all extremities. Sensation intact. Gait not checked.  PSYCHIATRIC: The patient is alert and oriented x 3.  Normal affect and good eye contact. SKIN: Mild right gluteal fluctuance without tenderness or induration or significant erythema.  No obvious other rash, lesion, or ulcer.   LABORATORY PANEL:   CBC Recent Labs  Lab 05/04/24 1606  WBC 11.2*  HGB 12.1*  HCT 37.6*  PLT 264   ------------------------------------------------------------------------------------------------------------------  Chemistries  Recent Labs  Lab 05/04/24 1606  NA 143  K 4.4  CL 107  CO2 23  GLUCOSE 127*  BUN 52*  CREATININE 6.24*  CALCIUM  8.6*  AST 14*  ALT 9  ALKPHOS 52  BILITOT 0.6   ------------------------------------------------------------------------------------------------------------------  Cardiac Enzymes No results for input(s): TROPONINI in the last 168  hours. ------------------------------------------------------------------------------------------------------------------  RADIOLOGY:  CT ABDOMEN PELVIS WO CONTRAST Result Date: 05/04/2024 CLINICAL DATA:  Abdominal pain. EXAM: CT ABDOMEN AND PELVIS WITHOUT CONTRAST TECHNIQUE: Multidetector CT imaging of the abdomen and pelvis was performed following the standard protocol without IV contrast. RADIATION DOSE REDUCTION: This exam was performed according to the departmental dose-optimization program which includes automated exposure control, adjustment of the mA and/or kV according to patient size and/or use of iterative reconstruction technique. COMPARISON:  None Available. FINDINGS: Evaluation of this exam is limited in the absence of intravenous contrast. Evaluation is also limited due to streak artifact caused by patient's arms and spinal hardware. Lower chest: The visualized lung bases are clear. There is coronary vascular calcification. No intra-abdominal free air or free fluid. Hepatobiliary: The liver is unremarkable. No biliary dilatation. The gallbladder is unremarkable. Pancreas: Unremarkable. No pancreatic ductal dilatation or surrounding inflammatory changes. Spleen: Normal in size without focal abnormality. Adrenals/Urinary Tract: The right adrenal gland is unremarkable. A 2 cm left adrenal adenoma.  There is no hydronephrosis or nephrolithiasis on either side. The visualized ureters and urinary bladder appear unremarkable. Stomach/Bowel: There is no bowel obstruction or active inflammation. The appendix is normal. Vascular/Lymphatic: Moderate aortoiliac atherosclerotic disease. The IVC is unremarkable. No portal venous gas. There is no adenopathy. Reproductive: The prostate and seminal vesicles are grossly unremarkable. No pelvic mass. Other: Partially visualized 2.5 x 5.0 cm indurated area in the subcutaneous soft tissues of the right buttock adjacent to the intergluteal cleft suspicious for a  perianal collection or developing abscess. Ultrasound may provide better evaluation. Musculoskeletal: Osteopenia with degenerative changes of the spine. Multilevel posterior fusion hardware. No acute osseous pathology. IMPRESSION: 1. No hydronephrosis or nephrolithiasis. 2. No bowel obstruction. Normal appendix. 3. Right perianal collection or developing abscess. Ultrasound may provide better evaluation. 4.  Aortic Atherosclerosis (ICD10-I70.0). Electronically Signed   By: Vanetta Chou M.D.   On: 05/04/2024 17:44   DG Chest Port 1 View Result Date: 05/04/2024 CLINICAL DATA:  106001 Syncope 106001 EXAM: PORTABLE CHEST - 1 VIEW COMPARISON:  None available. FINDINGS: No focal airspace consolidation, pleural effusion, or pneumothorax. No cardiomegaly. Aortic atherosclerosis. No acute fracture or destructive lesions. Multilevel thoracic osteophytosis. Partially visualized cervical fusion hardware IMPRESSION: No acute cardiopulmonary abnormality. Electronically Signed   By: Rogelia Myers M.D.   On: 05/04/2024 16:32      IMPRESSION AND PLAN:  Assessment and Plan: * Acute metabolic encephalopathy - This was associated with unresponsiveness/syncope. - The patient will be admitted to an observation medical telemetry bed. - Will follow neurochecks every 4 hours for 24 hours. - Will check orthostatics every 12 hours. - Will be monitored for arrhythmias. - Differential diagnosis would include neurally mediated syncope, cardiogenic, arrhythmia related, orthostatic hypotension and less likely hypoglycemia.  End-stage renal disease on hemodialysis [facility]) - He missed his hemodialysis session on Wednesday given his symptoms. - Nephrology consult will be obtained. - I notified Dr. Marlee.  Essential hypertension Will continue antihypertensive therapy.  Chronic obstructive pulmonary disease (COPD) (HCC) - Will continue his inhalers.  Type 2 diabetes mellitus with peripheral neuropathy (HCC) - The  patient will be placed on supplemental coverage with NovoLog . - Will continue basal coverage with - Will continue Neurontin .   DVT prophylaxis: Lovenox. Advanced Care Planning:  Code Status: full code. Family Communication:  The plan of care was discussed in details with the patient (and family). I answered all questions. The patient agreed to proceed with the above mentioned plan. Further management will depend upon hospital course. Disposition Plan: Back to previous home environment Consults called: Nephrology. All the records are reviewed and case discussed with ED provider.  Status is: Observation  I certify that at the time of admission, it is my clinical judgment that the patient will require inpatient hospital care extending more than 2 midnights.                            Dispo: The patient is from: Home              Anticipated d/c is to: Home              Patient currently is not medically stable to d/c.              Difficult to place patient: No  Madison DELENA Peaches M.D on 05/05/2024 at 2:27 AM  [facility]   From 7 PM-7 AM, contact night-coverage [email or web address]  CC: Primary care physician; Gerome,  Tillman CROME, FNP

## 2024-05-04 NOTE — ED Notes (Signed)
 Called out due to patient oxygen saturations dropping in the 70s. Patient asleep and easy to arouse. Patient does not have OSA according to him. Patient was placed on 2lpm of oxygen via [place].

## 2024-05-04 NOTE — ED Notes (Signed)
 Patient transported to CT

## 2024-05-04 NOTE — ED Notes (Signed)
 Patient stated that he normally takes 15mg  of Morphine  a day, but was just switched to 30mg . He took his first dose today of the 30mg  today.

## 2024-05-04 NOTE — ED Triage Notes (Signed)
 BIB [facility]. EMS from home. Called out for general weakness in HD pt. EMS arrived to unresponsive pt, GCS 3. Gradually arousable to GCS 15. Pt arrives alert, NAD, calm, interactive, resps e/u, speaking in clear complete sentences. BS 121. VSS. NSR on monitor. HD MWF. LAst HD Monday, due today, has not had HD today. NSL 18g R AC. L arm restriction. Rates bodyaches 8/10. Denies NVD, fever, CP, sob. +bruit/thrill.

## 2024-05-05 ENCOUNTER — Observation Stay (HOSPITAL_COMMUNITY)

## 2024-05-05 DIAGNOSIS — G9341 Metabolic encephalopathy: Secondary | ICD-10-CM | POA: Diagnosis not present

## 2024-05-05 DIAGNOSIS — J449 Chronic obstructive pulmonary disease, unspecified: Secondary | ICD-10-CM | POA: Diagnosis not present

## 2024-05-05 DIAGNOSIS — N186 End stage renal disease: Secondary | ICD-10-CM

## 2024-05-05 DIAGNOSIS — R55 Syncope and collapse: Secondary | ICD-10-CM | POA: Diagnosis not present

## 2024-05-05 DIAGNOSIS — E1142 Type 2 diabetes mellitus with diabetic polyneuropathy: Secondary | ICD-10-CM | POA: Diagnosis present

## 2024-05-05 DIAGNOSIS — Z992 Dependence on renal dialysis: Secondary | ICD-10-CM

## 2024-05-05 DIAGNOSIS — I10 Essential (primary) hypertension: Secondary | ICD-10-CM | POA: Diagnosis present

## 2024-05-05 LAB — GASTROINTESTINAL PANEL BY PCR, STOOL (REPLACES STOOL CULTURE)

## 2024-05-05 LAB — CBC
HCT: 35.1 % — ABNORMAL LOW (ref 39.0–52.0)
Hemoglobin: 11.3 g/dL — ABNORMAL LOW (ref 13.0–17.0)
MCH: 30.3 pg (ref 26.0–34.0)
MCHC: 32.2 g/dL (ref 30.0–36.0)
MCV: 94.1 fL (ref 80.0–100.0)
Platelets: 286 10*3/uL (ref 150–400)
RBC: 3.73 MIL/uL — ABNORMAL LOW (ref 4.22–5.81)
RDW: 14.9 % (ref 11.5–15.5)
WBC: 10.6 10*3/uL — ABNORMAL HIGH (ref 4.0–10.5)
nRBC: 0 % (ref 0.0–0.2)

## 2024-05-05 LAB — ECHOCARDIOGRAM COMPLETE
Area-P 1/2: 4.29 cm2
S' Lateral: 2.9 cm
Weight: 2215.18 [oz_av]

## 2024-05-05 LAB — BASIC METABOLIC PANEL WITH GFR
Anion gap: 12 (ref 5–15)
BUN: 55 mg/dL — ABNORMAL HIGH (ref 8–23)
CO2: 23 mmol/L (ref 22–32)
Calcium: 8.3 mg/dL — ABNORMAL LOW (ref 8.9–10.3)
Chloride: 108 mmol/L (ref 98–111)
Creatinine, Ser: 7 mg/dL — ABNORMAL HIGH (ref 0.61–1.24)
GFR, Estimated: 8 mL/min — ABNORMAL LOW (ref >60)
Glucose, Bld: 101 mg/dL — ABNORMAL HIGH (ref 70–99)
Potassium: 5 mmol/L (ref 3.5–5.1)
Sodium: 143 mmol/L (ref 135–145)

## 2024-05-05 LAB — OCCULT BLOOD, POC DEVICE: Fecal Occult Bld: NEGATIVE

## 2024-05-05 LAB — HIV ANTIBODY (ROUTINE TESTING W REFLEX): HIV Screen 4th Generation wRfx: NONREACTIVE

## 2024-05-05 MED ORDER — HYDROMORPHONE HCL 4 MG PO TABS: 4.0000 mg | ORAL_TABLET | Freq: Four times a day (QID) | ORAL | Status: DC | PRN

## 2024-05-05 MED ORDER — HEPARIN SODIUM (PORCINE) 1000 UNIT/ML DIALYSIS: 20.0000 [IU]/kg | INTRAMUSCULAR | Status: DC | PRN

## 2024-05-05 MED ORDER — HYDROXYZINE HCL 25 MG PO TABS
25.0000 mg | ORAL_TABLET | Freq: Once | ORAL | Status: DC
  Filled 2024-05-05: qty 1

## 2024-05-05 MED ORDER — PENTAFLUOROPROP-TETRAFLUOROETH EX AERO: 1.0000 | INHALATION_SPRAY | CUTANEOUS | Status: DC | PRN

## 2024-05-05 MED ORDER — LIDOCAINE-PRILOCAINE 2.5-2.5 % EX CREA: 1.0000 | TOPICAL_CREAM | CUTANEOUS | Status: DC | PRN

## 2024-05-05 MED ORDER — LIDOCAINE HCL (PF) 1 % IJ SOLN: 5.0000 mL | INTRAMUSCULAR | Status: DC | PRN

## 2024-05-05 NOTE — Assessment & Plan Note (Signed)
Will continue antihypertensive therapy.

## 2024-05-05 NOTE — Progress Notes (Addendum)
 PROGRESS NOTE   Curtis Hartman  FMW:[id number] DOB: 05/06/1961 DOA: 05/04/2024 PCP: Gerome Tillman LITTIE, FNP   Chief Complaint  Patient presents with   Loss of Consciousness   Level of care: Telemetry  Brief Admission History:   62 y.o. male with medical history significant for end-stage renal disease on hemodialysis on Monday Wednesday Fridays, COPD, type 2 diabetes mellitus, GERD, osteoarthritis, hypertension, chronic hepatitis C and peripheral neuropathy, who presented to the emergency room with acute onset of generalized weakness while he was in dialysis with unresponsiveness/syncope after which the patient was mildly confused.  No witnessed seizures.  He stated that he took 30 mg of p.o. morphine  sulfate this morning rather than 15 mg as his dose was recently increased.  He denied any fever or chills.  No chest pain or dyspnea or cough or wheezing.  No dysuria, urinary frequency or urgency, hematuria or flank pain.  He was unable to stand in the ER due to tremors in all 4 extremities.  He had diarrhea couple days ago which has resolved.  He admits to lower abdominal suprapubic pain.  He continues to smoke 5 cigarettes/day.  No paresthesias or focal muscle weakness.  No headache or dizziness or blurred vision.  No tinnitus or vertigo.  No bleeding diathesis.   ED Course: When he came to the ER, temperature was 99.5 and otherwise vital signs were within normal.  Labs revealed a BUN of 52 and creatinine 6.24, albumin  of 3.2 and CBC showed leukocytosis of 11.2 with neutrophilia and mild anemia.  GI panel was sent by the ED physician.   Imaging: Abdominal pelvic CT scan without contrast: 1. No hydronephrosis or nephrolithiasis. 2. No bowel obstruction. Normal appendix. 3. Right perianal collection or developing abscess. Ultrasound may provide better evaluation. 4.  Aortic Atherosclerosis.   Assessment and Plan:  Acute metabolic encephalopathy - This was associated with  unresponsiveness/syncope. - suspect related to adverse reaction to morphine  - DC morphine  -- agree with nephrologist; avoid morphine , can use hydromorphine, fentanyl , methadone for chronic pain - Will follow neurochecks every 4 hours for 24 hours. - syncope work up in progress  -- he remains confused, not back to baseline; recommend dialysis treatment  End-stage renal disease on hemodialysis - He missed his hemodialysis session on 7/2, planning to get it on 7/3 - Nephrology consult will be obtained. - Dr. Marlee arranging for HD treatment today 05/05/24   Chronic obstructive pulmonary disease (COPD)  - Will continue his inhalers.  Type 2 diabetes mellitus with peripheral neuropathy - The patient will be placed on supplemental coverage with NovoLog . - Will continue basal coverage with - Will continue Neurontin .  Essential hypertension Will continue antihypertensive therapy.  Question of developing anal abscess -- examined by Dr. Francesca in ED with a chaperone and he has some small fluctuance in this area but no tenderness, erythema or warmth to suggest acute infection  -- follow up outpatient with PCP and general surgeon Dr LOISE. Teppara   DVT prophylaxis: enoxaparin  Code Status: Full  Family Communication:  Disposition: home    Consultants:  Nephrology  Procedures:  HD Antimicrobials:    Subjective: Pt remains confused, not back to baseline.   Objective: Vitals:   05/04/24 2300 05/05/24 0428 05/05/24 0917 05/05/24 1253  BP: (!) 161/73 (!) 143/90 (!) 168/74 133/70  Pulse:  77 80 83  Resp:  18    Temp: 98.3 F (36.8 C) 98.3 F (36.8 C) 99 F (37.2 C) 98 F (36.7  C)  TempSrc: Oral Oral Oral Axillary  SpO2:  98% 97% 96%  Weight: 62.8 kg       Intake/Output Summary (Last 24 hours) at 05/05/2024 1536 Last data filed at 05/05/2024 1309 Gross per 24 hour  Intake 980 ml  Output 100 ml  Net 880 ml   Filed Weights   05/04/24 1602 05/04/24 2300  Weight: 72.6 kg 62.8 kg    Examination:  General exam: Appears calm and comfortable  Respiratory system: Clear to auscultation. Respiratory effort normal. Cardiovascular system: normal S1 & S2 heard. No JVD, murmurs, rubs, gallops or clicks. No pedal edema. Gastrointestinal system: Abdomen is nondistended, soft and nontender. No organomegaly or masses felt. Normal bowel sounds heard. Central nervous system: Alert and oriented to person, place. No focal neurological deficits. Extremities: Symmetric 5 x 5 power. Skin: No rashes, lesions or ulcers. Psychiatry: Judgement and insight appear diminished. Mood & affect appropriate.   Data Reviewed: I have personally reviewed following labs and imaging studies  CBC: Recent Labs  Lab 05/04/24 1606 05/05/24 0433  WBC 11.2* 10.6*  NEUTROABS 9.4*  --   HGB 12.1* 11.3*  HCT 37.6* 35.1*  MCV 93.8 94.1  PLT 264 286    Basic Metabolic Panel: Recent Labs  Lab 05/04/24 1606 05/05/24 0433  NA 143 143  K 4.4 5.0  CL 107 108  CO2 23 23  GLUCOSE 127* 101*  BUN 52* 55*  CREATININE 6.24* 7.00*  CALCIUM  8.6* 8.3*    CBG: Recent Labs  Lab 05/04/24 1619 05/04/24 2345  GLUCAP 135* 84    Recent Results (from the past 240 hours)  Gastrointestinal Panel by PCR , Stool     Status: None   Collection Time: 05/04/24  4:39 PM   Specimen: STOOL  Result Value Ref Range Status   Campylobacter species NOT DETECTED NOT DETECTED Final   Plesimonas shigelloides NOT DETECTED NOT DETECTED Final   Salmonella species NOT DETECTED NOT DETECTED Final   Yersinia enterocolitica NOT DETECTED NOT DETECTED Final   Vibrio species NOT DETECTED NOT DETECTED Final   Vibrio cholerae NOT DETECTED NOT DETECTED Final   Enteroaggregative E coli (EAEC) NOT DETECTED NOT DETECTED Final   Enteropathogenic E coli (EPEC) NOT DETECTED NOT DETECTED Final   Enterotoxigenic E coli (ETEC) NOT DETECTED NOT DETECTED Final   Shiga like toxin producing E coli (STEC) NOT DETECTED NOT DETECTED Final    Shigella/Enteroinvasive E coli (EIEC) NOT DETECTED NOT DETECTED Final   Cryptosporidium NOT DETECTED NOT DETECTED Final   Cyclospora cayetanensis NOT DETECTED NOT DETECTED Final   Entamoeba histolytica NOT DETECTED NOT DETECTED Final   Giardia lamblia NOT DETECTED NOT DETECTED Final   Adenovirus F40/41 NOT DETECTED NOT DETECTED Final   Astrovirus NOT DETECTED NOT DETECTED Final   Norovirus GI/GII NOT DETECTED NOT DETECTED Final   Rotavirus A NOT DETECTED NOT DETECTED Final   Sapovirus (I, II, IV, and V) NOT DETECTED NOT DETECTED Final    Comment: Performed at [facility], [address], [place], [place] [address]     Radiology Studies: US  Carotid Bilateral Result Date: 05/05/2024 CLINICAL DATA:  63 year old male with history of syncope and collapse. EXAM: BILATERAL CAROTID DUPLEX ULTRASOUND TECHNIQUE: Elnor scale imaging, color Doppler and duplex ultrasound were performed of bilateral carotid and vertebral arteries in the neck. COMPARISON:  None Available. FINDINGS: Criteria: Quantification of carotid stenosis is based on velocity parameters that correlate the residual internal carotid diameter with NASCET-based stenosis levels, using the diameter of  the distal internal carotid lumen as the denominator for stenosis measurement. The following velocity measurements were obtained: RIGHT ICA: Peak systolic velocity 119 cm/sec, End diastolic velocity 35 cm/sec CCA: Peak systolic velocity 113 cm/sec SYSTOLIC ICA/CCA RATIO:  1.1 ECA: Peak systolic velocity 162 cm/sec LEFT ICA: Peak systolic velocity 152 cm/sec, End diastolic velocity 25 cm/sec CCA: 110 cm/sec SYSTOLIC ICA/CCA RATIO:  1.4 ECA: 4138 cm/sec RIGHT CAROTID ARTERY: Moderate scattered atherosclerotic plaque formation most prominent about the carotid bulb and bifurcation. No significant tortuosity. Normal low resistance waveforms. RIGHT VERTEBRAL ARTERY:  Antegrade flow. LEFT CAROTID ARTERY: Moderate scattered atherosclerotic plaque  formationmost prominent about the carotid bulb and bifurcation. No significant tortuosity. Normal low resistance waveforms. LEFT VERTEBRAL ARTERY:  Antegrade flow. Upper extremity non-invasive blood pressures: Not obtained. IMPRESSION: 1. Right carotid artery system: Less than 50% stenosis secondary to moderate scattered atherosclerotic plaque formation, most prominent about the carotid bulb and bifurcation. 2. Left carotid artery system: Less than 50% stenosis secondary to moderate scattered atherosclerotic plaque formation, most prominent about the carotid bulb and bifurcation. 3.  Vertebral artery system: Patent with antegrade flow bilaterally. Ester Sides, MD [facility] Electronically Signed   By: Ester Sides M.D.   On: 05/05/2024 10:44   CT ABDOMEN PELVIS WO CONTRAST Result Date: 05/04/2024 CLINICAL DATA:  Abdominal pain. EXAM: CT ABDOMEN AND PELVIS WITHOUT CONTRAST TECHNIQUE: Multidetector CT imaging of the abdomen and pelvis was performed following the standard protocol without IV contrast. RADIATION DOSE REDUCTION: This exam was performed according to the departmental dose-optimization program which includes automated exposure control, adjustment of the mA and/or kV according to patient size and/or use of iterative reconstruction technique. COMPARISON:  None Available. FINDINGS: Evaluation of this exam is limited in the absence of intravenous contrast. Evaluation is also limited due to streak artifact caused by patient's arms and spinal hardware. Lower chest: The visualized lung bases are clear. There is coronary vascular calcification. No intra-abdominal free air or free fluid. Hepatobiliary: The liver is unremarkable. No biliary dilatation. The gallbladder is unremarkable. Pancreas: Unremarkable. No pancreatic ductal dilatation or surrounding inflammatory changes. Spleen: Normal in size without focal abnormality. Adrenals/Urinary Tract: The  right adrenal gland is unremarkable. A 2 cm left adrenal adenoma. There is no hydronephrosis or nephrolithiasis on either side. The visualized ureters and urinary bladder appear unremarkable. Stomach/Bowel: There is no bowel obstruction or active inflammation. The appendix is normal. Vascular/Lymphatic: Moderate aortoiliac atherosclerotic disease. The IVC is unremarkable. No portal venous gas. There is no adenopathy. Reproductive: The prostate and seminal vesicles are grossly unremarkable. No pelvic mass. Other: Partially visualized 2.5 x 5.0 cm indurated area in the subcutaneous soft tissues of the right buttock adjacent to the intergluteal cleft suspicious for a perianal collection or developing abscess. Ultrasound may provide better evaluation. Musculoskeletal: Osteopenia with degenerative changes of the spine. Multilevel posterior fusion hardware. No acute osseous pathology. IMPRESSION: 1. No hydronephrosis or nephrolithiasis. 2. No bowel obstruction. Normal appendix. 3. Right perianal collection or developing abscess. Ultrasound may provide better evaluation. 4.  Aortic Atherosclerosis (ICD10-I70.0). Electronically Signed   By: Vanetta Chou M.D.   On: 05/04/2024 17:44   DG Chest Port 1 View Result Date: 05/04/2024 CLINICAL DATA:  106001 Syncope 106001 EXAM: PORTABLE CHEST - 1 VIEW COMPARISON:  None available. FINDINGS: No focal airspace consolidation, pleural effusion, or pneumothorax. No cardiomegaly. Aortic atherosclerosis. No acute fracture or destructive lesions. Multilevel thoracic osteophytosis. Partially visualized cervical fusion hardware IMPRESSION: No acute cardiopulmonary abnormality. Electronically  Signed   By: Rogelia Myers M.D.   On: 05/04/2024 16:32    Scheduled Meds:  carvedilol   3.125 mg Oral BID WC   diphenoxylate-atropine  1 tablet Oral QID   enoxaparin (LOVENOX) injection  30 mg Subcutaneous Q24H   fluticasone   2 spray Each Nare Daily   gabapentin   400 mg Oral BID    hydrOXYzine  25 mg Oral Daily   insulin  glargine-yfgn  5 Units Subcutaneous QHS   oxybutynin  10 mg Oral Daily   sodium bicarbonate   650 mg Oral TID   Continuous Infusions:   LOS: 0 days   Time spent: 52 mins  Mayar Whittier Vicci, MD How to contact the [facility] Attending or Consulting provider 7A - 7P or covering provider during after hours 7P -7A, for this patient?  Check the care team in [facility] and look for a) attending/consulting [facility] provider listed and b) the [facility] team listed Log into [email or web address] to find provider on call.  Locate the [facility] provider you are looking for under [facility] and page to a number that you can be directly reached. If you still have difficulty reaching the provider, please page the [facility] (Director on Call) for the Hospitalists listed on amion for assistance.  05/05/2024, 3:36 PM

## 2024-05-05 NOTE — Assessment & Plan Note (Signed)
-   Will continue his inhalers.

## 2024-05-05 NOTE — Plan of Care (Signed)

## 2024-05-05 NOTE — Consult Note (Addendum)
 Curtis Hartman Admit Date: 05/04/2024 05/05/2024 Curtis Hartman Requesting Physician:  Lawence MD  Reason for Consult:  ESRD Comanagement HPI:  66M ESRD MWF [facility] admitted overnight after presenting from the dialysis unit with acute onset weakness and confusion.  This is in the context of recent initiation and up titration of oral morphine , currently 30 mg a day.  Admitted for observation.  Chest x-ray unremarkable.  Independently reviewed.  CT of the abdomen with a question of a right perianal fluid collection or developing abscess, undergoing evaluation with primary care.  He did not receive dialysis yesterday on schedule.  This morning he continues to feel confused.  No fevers.  On room air.  Blood pressures are normal to mildly elevated.  Labs reviewed, K5.0, BUN 55, hemoglobin 11.3, calcium  8.3.  PMH Incudes: Chronic pain, DM2, COPD, hypertension, hepatitis C, peripheral neuropathy  ROS Balance of 12 systems is negative w/ exceptions as above  PMH  Past Medical History:  Diagnosis Date   Arthritis    CKD (chronic kidney disease)    COPD (chronic obstructive pulmonary disease) (HCC)    Diabetes mellitus without complication (HCC)    type 2   GERD (gastroesophageal reflux disease)    Headache    Hx Migraines   Hepatitis    Hypertension    Neuropathy    PSH  Past Surgical History:  Procedure Laterality Date   ANTERIOR CERVICAL DECOMP/DISCECTOMY FUSION N/A 11/05/2022   Procedure: Anterior Cervical Decompression/discectomy Fusion Cervical Four-Cervical Five;  Surgeon: Debby Dorn MATSU, MD;  Location: [facility] OR;  Service: Neurosurgery;  Laterality: N/A;  3C   ANTERIOR LAT LUMBAR FUSION N/A 09/01/2023   Procedure: DIRECT LUMBAR INTERBODY FUSION, LUMBAR TWO-LUMBAR THREE, LUMBAR THREE-LUMBAR FOUR, LUMBAR FOUR-LUMBAR FIVE, RIGHT PRONE TRANSPSOAS, LUMBAR TWO LUMBAR FIVE POSTERIOR PERCUTANEOUS FUSION LUMBAR FOUR-LUMBAR FIVE MINIMALLY INVASIVE LAMINECTOMY, FORAMINOTOMY;  Surgeon:  Debby Dorn MATSU, MD;  Location: [facility] OR;  Service: Neurosurgery;  Laterality: N/A;   AV FISTULA PLACEMENT Left 09/10/2023   Procedure: LEFT ARM ARTERIOVENOUS (AV) FISTULA CREATION;  Surgeon: Pearline Norman RAMAN, MD;  Location: [facility] OR;  Service: Vascular;  Laterality: Left;   BACK SURGERY  2023   Cervical Spine   BIOPSY  03/03/2022   Procedure: BIOPSY;  Surgeon: Cindie Carlin POUR, DO;  Location: [facility] ENDO SUITE;  Service: Endoscopy;;   COLONOSCOPY WITH PROPOFOL  N/A 03/03/2022   Procedure: COLONOSCOPY WITH PROPOFOL ;  Surgeon: Cindie Carlin POUR, DO;  Location: [facility] ENDO SUITE;  Service: Endoscopy;  Laterality: N/A;  8:00am   ESOPHAGOGASTRODUODENOSCOPY (EGD) WITH PROPOFOL  N/A 03/03/2022   Procedure: ESOPHAGOGASTRODUODENOSCOPY (EGD) WITH PROPOFOL ;  Surgeon: Cindie Carlin POUR, DO;  Location: [facility] ENDO SUITE;  Service: Endoscopy;  Laterality: N/A;   foot right partial toe amputation Right    FOOT SURGERY Right    x3   hernia surgery x4     LUMBAR PERCUTANEOUS PEDICLE SCREW 3 LEVEL N/A 09/01/2023   Procedure: L2-5 LUMBAR PERCUTANEOUS PEDICLE SCREW 3 LEVEL;  Surgeon: Debby Dorn MATSU, MD;  Location: [facility] OR;  Service: Neurosurgery;  Laterality: N/A;   POLYPECTOMY  03/03/2022   Procedure: POLYPECTOMY;  Surgeon: Cindie Carlin POUR, DO;  Location: [facility] ENDO SUITE;  Service: Endoscopy;;   FH  Family History  Problem Relation Age of Onset   Heart disease Mother    Seizures Father    Stroke Maternal Grandfather    Liver disease Paternal Grandmother    SH  reports that he has been smoking cigarettes. He has never been exposed  to tobacco smoke. He has never used smokeless tobacco. He reports that he does not currently use alcohol. He reports that he does not currently use drugs. Allergies No Known Allergies Home medications Prior to Admission medications   Medication Sig Start Date End Date Taking? Authorizing Provider  acetaminophen  (TYLENOL ) 325 MG tablet Take 1-2 tablets (325-650 mg total) by mouth every 4  (four) hours as needed for mild pain (pain score 1-3). 09/24/23  Yes Setzer, Sandra J, PA-C  albuterol  (PROAIR  HFA) 108 (90 Base) MCG/ACT inhaler 2 puffs every 4 hours as needed only  if your can't catch your breath 06/10/23  Yes Darlean Ozell NOVAK, MD  amLODipine  (NORVASC ) 10 MG tablet Take 10 mg by mouth daily as needed (diastolic blood pressure above 90).   Yes [provider]  Budeson-Glycopyrrol-Formoterol  (BREZTRI  AEROSPHERE) 160-9-4.8 MCG/ACT AERO Inhale 2 puffs into the lungs in the morning and at bedtime. Patient taking differently: Inhale 2 puffs into the lungs 2 (two) times daily as needed (shortness of breath). 06/10/23  Yes Darlean Ozell NOVAK, MD  carvedilol  (COREG ) 3.125 MG tablet Take 1 tablet (3.125 mg total) by mouth 2 (two) times daily with a meal. 09/24/23  Yes Setzer, Nena PARAS, PA-C  diphenoxylate-atropine (LOMOTIL) 2.5-0.025 MG tablet Take 1 tablet by mouth 4 (four) times daily.   Yes [provider]  fluticasone  (FLONASE ) 50 MCG/ACT nasal spray Place 2 sprays into both nostrils daily. 09/24/23  Yes Setzer, Nena PARAS, PA-C  furosemide (LASIX) 80 MG tablet Take 80 mg by mouth daily as needed for edema or fluid. 01/20/24  Yes [provider]  gabapentin  (NEURONTIN ) 400 MG capsule Take 400 mg by mouth 2 (two) times daily. 03/08/24  Yes [provider]  hydrOXYzine (ATARAX) 25 MG tablet Take 25 mg by mouth daily. 04/21/24  Yes [provider]  LANTUS  100 UNIT/ML injection Inject 5 Units into the skin at bedtime as needed (blood sugar over 150). 03/05/21  Yes [provider]  morphine  (MSIR) 30 MG tablet Take 30 mg by mouth 2 (two) times daily as needed for moderate pain (pain score 4-6).   Yes [provider]  oxybutynin (DITROPAN-XL) 10 MG 24 hr tablet Take 10 mg by mouth daily. 02/23/24  Yes [provider]  sodium bicarbonate  650 MG tablet Take 1 tablet (650 mg total) by mouth 3 (three) times daily. 09/24/23  Yes Setzer, Nena PARAS, PA-C    Current Medications Scheduled Meds:  carvedilol   3.125 mg Oral BID WC   diphenoxylate-atropine  1 tablet Oral QID   enoxaparin (LOVENOX) injection  30 mg Subcutaneous Q24H   fluticasone   2 spray Each Nare Daily   gabapentin   400 mg Oral BID   hydrOXYzine  25 mg Oral Daily   insulin  glargine-yfgn  5 Units Subcutaneous QHS   oxybutynin  10 mg Oral Daily   sodium bicarbonate   650 mg Oral TID   Continuous Infusions: PRN Meds:.acetaminophen  **OR** acetaminophen , albuterol , amLODipine , furosemide, morphine , ondansetron  **OR** ondansetron  (ZOFRAN ) IV, traZODone  CBC Recent Labs  Lab 05/04/24 1606 05/05/24 0433  WBC 11.2* 10.6*  NEUTROABS 9.4*  --   HGB 12.1* 11.3*  HCT 37.6* 35.1*  MCV 93.8 94.1  PLT 264 286   Basic Metabolic Panel Recent Labs  Lab 05/04/24 1606 05/05/24 0433  NA 143 143  K 4.4 5.0  CL 107 108  CO2 23 23  GLUCOSE 127* 101*  BUN 52* 55*  CREATININE 6.24* 7.00*  CALCIUM  8.6* 8.3*  Physical Exam  Blood pressure (!) 168/74, pulse 80, temperature 99 F (37.2 C), temperature source Oral, resp. rate 18, weight 62.8 kg, SpO2 97%. GEN: Chronically ill-appearing, no distress, conversant but does not feel well ENT: NCAT EYES: EOMI CV: Regular, no murmur or rub PULM: Clear bilaterally ABD: Soft, nontender SKIN: No rashes or lesions EXT: No significant edema AVF + B/T  Assessment 1M ESRD MWF iHD with weakness/near syncope/confusion after increasing morphine  dosing  ESRD MWF: Off schedule missing 05/04/2024: Dialysis today using AV fistula, 1 to 2 L UF, 3.5 hours 2K bath AMS / Weakness: Suspect that morphine  contributory.  Recommend against morphine  for chronic pain and would prefer synthetic opioids such as hydromorphone , fentanyl , methadone. 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

## 2024-05-05 NOTE — Assessment & Plan Note (Signed)
-   He missed his hemodialysis session on Wednesday given his symptoms. - Nephrology consult will be obtained. - I notified Dr. Marlee.

## 2024-05-05 NOTE — Progress Notes (Signed)
 Pt escorted to HD in his bed by [place], RN and [place], [place] at [phone]

## 2024-05-05 NOTE — Assessment & Plan Note (Addendum)
-   This was associated with unresponsiveness/syncope. - The patient will be admitted to an observation medical telemetry bed. - Will follow neurochecks every 4 hours for 24 hours. - Will check orthostatics every 12 hours. - Will be monitored for arrhythmias. - Differential diagnosis would include neurally mediated syncope, cardiogenic, arrhythmia related, orthostatic hypotension and less likely hypoglycemia.

## 2024-05-05 NOTE — Progress Notes (Signed)
   05/05/24 0919  TOC Brief Assessment  Insurance and Status Reviewed  Patient has primary care physician Yes  Home environment has been reviewed From home  Prior level of function: Min assist  Prior/Current Home Services No current home services  Social Drivers of Health Review SDOH reviewed interventions complete (smoking cessation added to AVS)  Readmission risk has been reviewed Yes  Transition of care needs no transition of care needs at this time   Transition of Care Department [facility]) has reviewed patient and no TOC needs have been identified at this time. We will continue to monitor patient advancement through interdisciplinary progression rounds. If new patient transition needs arise, please place a TOC consult.

## 2024-05-05 NOTE — Assessment & Plan Note (Signed)
-   The patient will be placed on supplemental coverage with NovoLog . - Will continue basal coverage with - Will continue Neurontin .

## 2024-05-05 NOTE — Hospital Course (Signed)
 63 y.o. male with medical history significant for end-stage renal disease on hemodialysis on Monday Wednesday Fridays, COPD, type 2 diabetes mellitus, GERD, osteoarthritis, hypertension, chronic hepatitis C and peripheral neuropathy, who presented to the emergency room with acute onset of generalized weakness while he was in dialysis with unresponsiveness/syncope after which the patient was mildly confused.  No witnessed seizures.  He stated that he took 30 mg of p.o. morphine  sulfate this morning rather than 15 mg as his dose was recently increased.  He denied any fever or chills.  No chest pain or dyspnea or cough or wheezing.  No dysuria, urinary frequency or urgency, hematuria or flank pain.  He was unable to stand in the ER due to tremors in all 4 extremities.  He had diarrhea couple days ago which has resolved.  He admits to lower abdominal suprapubic pain.  He continues to smoke 5 cigarettes/day.  No paresthesias or focal muscle weakness.  No headache or dizziness or blurred vision.  No tinnitus or vertigo.  No bleeding diathesis.   ED Course: When he came to the ER, temperature was 99.5 and otherwise vital signs were within normal.  Labs revealed a BUN of 52 and creatinine 6.24, albumin  of 3.2 and CBC showed leukocytosis of 11.2 with neutrophilia and mild anemia.  GI panel was sent by the ED physician.   Imaging: Abdominal pelvic CT scan without contrast: 1. No hydronephrosis or nephrolithiasis. 2. No bowel obstruction. Normal appendix. 3. Right perianal collection or developing abscess. Ultrasound may provide better evaluation. 4.  Aortic Atherosclerosis.

## 2024-05-05 NOTE — Plan of Care (Signed)
   Problem: Health Behavior/Discharge Planning: Goal: Ability to manage health-related needs will improve Outcome: Progressing

## 2024-05-06 ENCOUNTER — Observation Stay (HOSPITAL_COMMUNITY)

## 2024-05-06 ENCOUNTER — Encounter (HOSPITAL_COMMUNITY): Payer: Self-pay | Admitting: Family Medicine

## 2024-05-06 ENCOUNTER — Inpatient Hospital Stay (HOSPITAL_COMMUNITY)

## 2024-05-06 DIAGNOSIS — T402X1A Poisoning by other opioids, accidental (unintentional), initial encounter: Secondary | ICD-10-CM

## 2024-05-06 DIAGNOSIS — E1142 Type 2 diabetes mellitus with diabetic polyneuropathy: Secondary | ICD-10-CM

## 2024-05-06 DIAGNOSIS — K610 Anal abscess: Secondary | ICD-10-CM | POA: Diagnosis present

## 2024-05-06 DIAGNOSIS — I639 Cerebral infarction, unspecified: Principal | ICD-10-CM | POA: Diagnosis present

## 2024-05-06 DIAGNOSIS — J449 Chronic obstructive pulmonary disease, unspecified: Secondary | ICD-10-CM | POA: Diagnosis present

## 2024-05-06 DIAGNOSIS — R29700 NIHSS score 0: Secondary | ICD-10-CM

## 2024-05-06 DIAGNOSIS — B182 Chronic viral hepatitis C: Secondary | ICD-10-CM | POA: Diagnosis present

## 2024-05-06 DIAGNOSIS — R4182 Altered mental status, unspecified: Secondary | ICD-10-CM | POA: Diagnosis not present

## 2024-05-06 DIAGNOSIS — Z992 Dependence on renal dialysis: Secondary | ICD-10-CM | POA: Diagnosis not present

## 2024-05-06 DIAGNOSIS — R251 Tremor, unspecified: Secondary | ICD-10-CM

## 2024-05-06 DIAGNOSIS — Z7982 Long term (current) use of aspirin: Secondary | ICD-10-CM | POA: Diagnosis not present

## 2024-05-06 DIAGNOSIS — G928 Other toxic encephalopathy: Secondary | ICD-10-CM | POA: Diagnosis present

## 2024-05-06 DIAGNOSIS — Z794 Long term (current) use of insulin: Secondary | ICD-10-CM | POA: Diagnosis not present

## 2024-05-06 DIAGNOSIS — E1122 Type 2 diabetes mellitus with diabetic chronic kidney disease: Secondary | ICD-10-CM | POA: Diagnosis present

## 2024-05-06 DIAGNOSIS — Z8673 Personal history of transient ischemic attack (TIA), and cerebral infarction without residual deficits: Secondary | ICD-10-CM | POA: Diagnosis not present

## 2024-05-06 DIAGNOSIS — Z716 Tobacco abuse counseling: Secondary | ICD-10-CM | POA: Diagnosis not present

## 2024-05-06 DIAGNOSIS — T402X5A Adverse effect of other opioids, initial encounter: Secondary | ICD-10-CM | POA: Diagnosis present

## 2024-05-06 DIAGNOSIS — F17210 Nicotine dependence, cigarettes, uncomplicated: Secondary | ICD-10-CM | POA: Diagnosis present

## 2024-05-06 DIAGNOSIS — E669 Obesity, unspecified: Secondary | ICD-10-CM | POA: Diagnosis present

## 2024-05-06 DIAGNOSIS — I63531 Cerebral infarction due to unspecified occlusion or stenosis of right posterior cerebral artery: Secondary | ICD-10-CM | POA: Diagnosis not present

## 2024-05-06 DIAGNOSIS — I120 Hypertensive chronic kidney disease with stage 5 chronic kidney disease or end stage renal disease: Secondary | ICD-10-CM | POA: Diagnosis present

## 2024-05-06 DIAGNOSIS — Z8249 Family history of ischemic heart disease and other diseases of the circulatory system: Secondary | ICD-10-CM | POA: Diagnosis not present

## 2024-05-06 DIAGNOSIS — R531 Weakness: Secondary | ICD-10-CM | POA: Diagnosis present

## 2024-05-06 DIAGNOSIS — D631 Anemia in chronic kidney disease: Secondary | ICD-10-CM | POA: Diagnosis present

## 2024-05-06 DIAGNOSIS — G9341 Metabolic encephalopathy: Secondary | ICD-10-CM | POA: Diagnosis not present

## 2024-05-06 DIAGNOSIS — R569 Unspecified convulsions: Secondary | ICD-10-CM | POA: Diagnosis not present

## 2024-05-06 DIAGNOSIS — N186 End stage renal disease: Secondary | ICD-10-CM | POA: Diagnosis present

## 2024-05-06 DIAGNOSIS — Z823 Family history of stroke: Secondary | ICD-10-CM | POA: Diagnosis not present

## 2024-05-06 DIAGNOSIS — R404 Transient alteration of awareness: Secondary | ICD-10-CM

## 2024-05-06 DIAGNOSIS — G8929 Other chronic pain: Secondary | ICD-10-CM | POA: Diagnosis present

## 2024-05-06 DIAGNOSIS — K219 Gastro-esophageal reflux disease without esophagitis: Secondary | ICD-10-CM | POA: Diagnosis present

## 2024-05-06 DIAGNOSIS — R29710 NIHSS score 10: Secondary | ICD-10-CM | POA: Diagnosis not present

## 2024-05-06 DIAGNOSIS — I63542 Cerebral infarction due to unspecified occlusion or stenosis of left cerebellar artery: Secondary | ICD-10-CM | POA: Diagnosis not present

## 2024-05-06 LAB — RENAL FUNCTION PANEL
Albumin: 2.7 g/dL — ABNORMAL LOW (ref 3.5–5.0)
Anion gap: 10 (ref 5–15)
BUN: 36 mg/dL — ABNORMAL HIGH (ref 8–23)
CO2: 27 mmol/L (ref 22–32)
Calcium: 8.4 mg/dL — ABNORMAL LOW (ref 8.9–10.3)
Chloride: 97 mmol/L — ABNORMAL LOW (ref 98–111)
Creatinine, Ser: 5.16 mg/dL — ABNORMAL HIGH (ref 0.61–1.24)
GFR, Estimated: 12 mL/min — ABNORMAL LOW (ref >60)
Glucose, Bld: 95 mg/dL (ref 70–99)
Phosphorus: 5.5 mg/dL — ABNORMAL HIGH (ref 2.5–4.6)
Potassium: 4.6 mmol/L (ref 3.5–5.1)
Sodium: 134 mmol/L — ABNORMAL LOW (ref 135–145)

## 2024-05-06 LAB — HEPATITIS B SURFACE ANTIGEN: Hepatitis B Surface Ag: NONREACTIVE

## 2024-05-06 LAB — LIPID PANEL
Cholesterol: 123 mg/dL (ref 0–200)
HDL: 42 mg/dL (ref >40)
LDL Cholesterol: 67 mg/dL (ref 0–99)
Total CHOL/HDL Ratio: 2.9 ratio
Triglycerides: 71 mg/dL (ref <150)
VLDL: 14 mg/dL (ref 0–40)

## 2024-05-06 LAB — GLUCOSE, CAPILLARY
Glucose-Capillary: 121 mg/dL — ABNORMAL HIGH (ref 70–99)
Glucose-Capillary: 104 mg/dL — ABNORMAL HIGH (ref 70–99)
Glucose-Capillary: 93 mg/dL (ref 70–99)

## 2024-05-06 LAB — FOLATE: Folate: 6.3 ng/mL (ref >5.9)

## 2024-05-06 LAB — HEMOGLOBIN A1C
Hgb A1c MFr Bld: 4.9 % (ref 4.8–5.6)
Mean Plasma Glucose: 93.93 mg/dL

## 2024-05-06 LAB — AMMONIA: Ammonia: 14 umol/L (ref 9–35)

## 2024-05-06 LAB — VITAMIN B12: Vitamin B-12: 361 pg/mL (ref 180–914)

## 2024-05-06 LAB — TSH: TSH: 1.038 u[IU]/mL (ref 0.350–4.500)

## 2024-05-06 MED ORDER — ASPIRIN 81 MG PO CHEW
81.0000 mg | CHEWABLE_TABLET | Freq: Every day | ORAL | Status: DC
  Administered 2024-05-07 – 2024-05-11 (×5): 81 mg via ORAL
  Filled 2024-05-06 (×5): qty 1

## 2024-05-06 MED ORDER — CLOPIDOGREL BISULFATE 75 MG PO TABS
75.0000 mg | ORAL_TABLET | Freq: Every day | ORAL | Status: DC
  Administered 2024-05-06 – 2024-05-11 (×6): 75 mg via ORAL
  Filled 2024-05-06 (×6): qty 1

## 2024-05-06 MED ORDER — DIPHENOXYLATE-ATROPINE 2.5-0.025 MG PO TABS: 1.0000 | ORAL_TABLET | Freq: Four times a day (QID) | ORAL | Status: DC | PRN

## 2024-05-06 MED ORDER — HYDROMORPHONE HCL 2 MG PO TABS
2.0000 mg | ORAL_TABLET | Freq: Four times a day (QID) | ORAL | Status: DC | PRN
  Administered 2024-05-06 – 2024-05-11 (×8): 2 mg via ORAL
  Filled 2024-05-06 (×8): qty 1

## 2024-05-06 MED ORDER — CHLORHEXIDINE GLUCONATE CLOTH 2 % EX PADS
6.0000 | MEDICATED_PAD | Freq: Every day | CUTANEOUS | Status: DC
  Administered 2024-05-06 – 2024-05-10 (×3): 6 via TOPICAL

## 2024-05-06 MED ORDER — STROKE: EARLY STAGES OF RECOVERY BOOK: Freq: Once | Status: AC

## 2024-05-06 MED ORDER — INSULIN ASPART 100 UNIT/ML IJ SOLN
0.0000 [IU] | Freq: Three times a day (TID) | INTRAMUSCULAR | Status: DC
  Administered 2024-05-09: 3 [IU] via SUBCUTANEOUS
  Administered 2024-05-10: 1 [IU] via SUBCUTANEOUS

## 2024-05-06 MED ORDER — ASPIRIN 325 MG PO TABS
325.0000 mg | ORAL_TABLET | Freq: Once | ORAL | Status: AC
  Administered 2024-05-06: 325 mg via ORAL
  Filled 2024-05-06: qty 1

## 2024-05-06 MED ORDER — HYDROXYZINE HCL 25 MG PO TABS
25.0000 mg | ORAL_TABLET | Freq: Three times a day (TID) | ORAL | Status: DC | PRN
  Administered 2024-05-08: 25 mg via ORAL
  Filled 2024-05-06: qty 1

## 2024-05-06 NOTE — Plan of Care (Signed)
   Problem: Activity: Goal: Risk for activity intolerance will decrease Outcome: Progressing   Problem: Coping: Goal: Level of anxiety will decrease Outcome: Progressing   Problem: Safety: Goal: Ability to remain free from injury will improve Outcome: Progressing

## 2024-05-06 NOTE — Progress Notes (Signed)
 Received patient in bed to unit.  Alert and oriented.  Informed consent signed and in chart.   TX duration:3.5hours  Patient tolerated well.  Transported back to the room  Alert, without acute distress.  Hand-off given to patient's nurse.   Access used: fistula Access issues: none  Total UF removed: 3000 ml Medication(s) given: none Post HD VS: 137/50 Post HD weight: 62.3 kg    05/06/24 0145  Vitals  Temp 98 F (36.7 C)  Temp Source Oral  BP (!) 152/73  MAP (mmHg) 94  BP Location Right Arm  BP Method Automatic  Patient Position (if appropriate) Lying  Pulse Rate 81  Pulse Rate Source Monitor  ECG Heart Rate 81  Resp 16  Oxygen Therapy  SpO2 100 %  O2 Device Room Air  Patient Activity (if Appropriate) In bed  Pulse Oximetry Type Continuous  Post Treatment  Dialyzer Clearance Lightly streaked  Hemodialysis Intake (mL) 0 mL  Liters Processed 74.7  Fluid Removed (mL) 3000 mL  Tolerated HD Treatment Yes  Post-Hemodialysis Comments HD tx completed as expected, , tolerated well, pt is stable.  AVG/AVF Arterial Site Held (minutes) 10 minutes  AVG/AVF Venous Site Held (minutes) 10 minutes  Note  Patient Observations pt is in bed resting, eyes closed.  Fistula / Graft Left Forearm Arteriovenous fistula  Placement Date/Time: 09/10/23 1209   Placed prior to admission: No  Orientation: Left  Access Location: Forearm  Access Type: Arteriovenous fistula  Site Condition No complications  Fistula / Graft Assessment Present;Thrill;Bruit  Status Deaccessed  Drainage Description None      [facility]

## 2024-05-06 NOTE — Consult Note (Signed)
 I connected with  Curtis Hartman on 05/06/24 by a video enabled telemedicine application and verified that I am speaking with the correct person using two identifiers.   I discussed the limitations of evaluation and management by telemedicine. The patient expressed understanding and agreed to proceed.  Location of patient: [facility] Location of physician: [facility]  Neurology Consultation Reason for Consult: Stroke Referring Physician: Dr. Afton Louder  CC: Altered mental status  History is obtained from: Patient, chart review  HPI: Curtis Hartman is a 63 y.o. 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  Reportedly took 30 mg of p.o. morphine  and 0 50 mg due to recent change in dose that morning.  Subsequently while in dialysis he was noted to be weak and confused/not responding briefly.  On arrival to ER he had tremors in all 4 extremities while standing up.   Blood pressure was 161/73 with normal blood glucose.  Last known normal: 05/04/2024 Event happened at dialysis center No tPA at outside window No thrombectomy as outside window mRS 2   ROS: All other systems reviewed and negative except as noted in the HPI.    Past Medical History:  Diagnosis Date   Arthritis    CKD (chronic kidney disease)    COPD (chronic obstructive pulmonary disease) (HCC)    Diabetes mellitus without complication (HCC)    type 2   GERD (gastroesophageal reflux disease)    Headache    Hx Migraines   Hepatitis    Hypertension    Neuropathy     Family History  Problem Relation Age of Onset   Heart disease Mother    Seizures Father    Stroke Maternal Grandfather    Liver disease Paternal Grandmother     Social History:  reports that he has been smoking cigarettes. He has never been exposed to tobacco smoke. He has never used smokeless tobacco. He  reports that he does not currently use alcohol. He reports that he does not currently use drugs.   Medications Prior to Admission  Medication Sig Dispense Refill Last Dose/Taking   acetaminophen  (TYLENOL ) 325 MG tablet Take 1-2 tablets (325-650 mg total) by mouth every 4 (four) hours as needed for mild pain (pain score 1-3).   05/04/2024 Morning   albuterol  (PROAIR  HFA) 108 (90 Base) MCG/ACT inhaler 2 puffs every 4 hours as needed only  if your can't catch your breath 18 g 11 Past Week   amLODipine  (NORVASC ) 10 MG tablet Take 10 mg by mouth daily as needed (diastolic blood pressure above 90).   Past Week   Budeson-Glycopyrrol-Formoterol  (BREZTRI  AEROSPHERE) 160-9-4.8 MCG/ACT AERO Inhale 2 puffs into the lungs in the morning and at bedtime. (Patient taking differently: Inhale 2 puffs into the lungs 2 (two) times daily as needed (shortness of breath).)   Past Week   carvedilol  (COREG ) 3.125 MG tablet Take 1 tablet (3.125 mg total) by mouth 2 (two) times daily with a meal. 60 tablet 0 05/04/2024 Morning   diphenoxylate -atropine  (LOMOTIL ) 2.5-0.025 MG tablet Take 1 tablet by mouth 4 (four) times daily.   Past Week   fluticasone  (FLONASE ) 50 MCG/ACT nasal spray Place 2 sprays into both nostrils daily.   Past Week   furosemide  (LASIX ) 80 MG tablet Take 80 mg by mouth daily as needed for edema or fluid.   Past Week   gabapentin  (NEURONTIN ) 400 MG capsule  Take 400 mg by mouth 2 (two) times daily.   Past Week   hydrOXYzine  (ATARAX ) 25 MG tablet Take 25 mg by mouth daily.   05/04/2024 Morning   LANTUS  100 UNIT/ML injection Inject 5 Units into the skin at bedtime as needed (blood sugar over 150).   Past Month   morphine  (MSIR) 30 MG tablet Take 30 mg by mouth 2 (two) times daily as needed for moderate pain (pain score 4-6).   05/04/2024 Morning   oxybutynin  (DITROPAN -XL) 10 MG 24 hr tablet Take 10 mg by mouth daily.   Past Week   sodium bicarbonate  650 MG tablet Take 1 tablet (650 mg total) by mouth 3 (three) times  daily. 90 tablet 0 05/04/2024 Morning      Exam: Current vital signs: BP 138/70 (BP Location: Right Arm)   Pulse 83   Temp 99 F (37.2 C) (Oral)   Resp 16   Wt 62.3 kg   SpO2 97%   BMI 20.28 kg/m  Vital signs in last 24 hours: Temp:  [98 F (36.7 C)-99 F (37.2 C)] 99 F (37.2 C) (07/04 0259) Pulse Rate:  [75-84] 83 (07/04 0259) Resp:  [11-21] 16 (07/04 0259) BP: (118-177)/(66-149) 138/70 (07/04 0259) SpO2:  [96 %-100 %] 97 % (07/04 0259) Weight:  [62.3 kg] 62.3 kg (07/04 0201)   Physical Exam  Constitutional: Appears well-developed and well-nourished.  Psych: Affect appropriate to situation Neuro: AO x 3, no aphasia, mild dysarthria (could be because patient is edentulous), cranial nerves grossly intact, antigravity strength with drift in all 4 extremities, sensory intact to light touch, FTN intact, has what looks like negative myoclonus on tele in bilateral upper extremities when arms are outstretched  NIHSS 0    I have reviewed labs in epic and the results pertinent to this consultation are: CBC:  Recent Labs  Lab 05/04/24 1606 05/05/24 0433  WBC 11.2* 10.6*  NEUTROABS 9.4*  --   HGB 12.1* 11.3*  HCT 37.6* 35.1*  MCV 93.8 94.1  PLT 264 286    Basic Metabolic Panel:  Lab Results  Component Value Date   NA 143 05/05/2024   K 5.0 05/05/2024   CO2 23 05/05/2024   GLUCOSE 101 (H) 05/05/2024   BUN 55 (H) 05/05/2024   CREATININE 7.00 (H) 05/05/2024   CALCIUM  8.3 (L) 05/05/2024   GFRNONAA 8 (L) 05/05/2024   Lipid Panel: No results found for: LDLCALC HgbA1c:  Lab Results  Component Value Date   HGBA1C 5.7 (H) 08/26/2023   Urine Drug Screen: No results found for: LABOPIA, COCAINSCRNUR, LABBENZ, AMPHETMU, THCU, LABBARB  Alcohol Level No results found for: ETH   I have reviewed the images obtained: MRI brain without contrast 05/06/2024: Positive for multiple subcentimeter acute lacunar type infarcts, including the posterior right frontal  lobe white matter, left lateral cerebellum. Possible additional punctate involvement including in the right midbrain. Negative for evidence of acute intracranial hemorrhage or mass effect. Evidence of underlying moderately advanced chronic small vessel ischemia in the brain, including chronic lacunar infarcts in the right pons, chronic microhemorrhage in the left pons and left cerebellum.  Carotid ultrasound 05/05/2024: 1. Right carotid artery system: Less than 50% stenosis secondary to moderate scattered atherosclerotic plaque formation, most prominent about the carotid bulb and bifurcation.  2. Left carotid artery system: Less than 50% stenosis secondary to moderate scattered atherosclerotic plaque formation, most prominent about the carotid bulb and bifurcation.  3.  Vertebral artery system: Patent with antegrade flow bilaterally.  ASSESSMENT/PLAN: 63 year old male who was found to have an episode of altered mental status/syncope while he was at dialysis.  Reportedly he took 30 mg of morphine  instead of his previous dose of 15 mg that morning.   Acute ischemic stroke (incidental) Etiology: Likely due to hypoperfusion versus embolic  Recommendations: - Recommend loading with aspirin  325 mg and then starting apirin 81 mg daily and Plavix  75 mg daily - Lipid panel ordered and pending.  Please start high intensity atorvastatin if LDL more than 70 - MRA head ordered and pending.  If there is significant intracranial stenosis, recommend dual antiplatelets for 3 months followed by aspirin  81 mg daily.  If there is no intracranial stenosis, recommend dual antiplatelets for 3 weeks followed by aspirin  81 mg daily - Cardiac monitor to look for paroxysmal A-fib at the time of discharge - Hold blood pressure: Normotension -PT/OT -Stroke education -Smoking cessation counseling -Follow-up with neurology in 2 to 3 months (order placed)  Alteration of awareness -Most likely secondary to  higher dose of opioid use in the morning - Tremor-like movements as well as semiology of episode unlikely to be due to seizure.  However will get routine EEG to assess for epileptogenicity.  Will not start antiseizure medications unless definite abnormality on EEG -Differentials also include orthostatic tremor.  Orthostatic vital signs ordered and pending - Will also check ammonia, TSH, B12, folate, RPR - If above workup negative, will defer further workup to primary team   Thank you for allowing us  to participate in the care of this patient. If you have any further questions, please contact  me or neurohospitalist.   Arlin Krebs [facility]

## 2024-05-06 NOTE — Plan of Care (Signed)
  Problem: Activity: Goal: Risk for activity intolerance will decrease 05/06/2024 2208 by Jalene Lacko B, LPN Outcome: Progressing 05/06/2024 2208 by Raimi Guillermo B, LPN Outcome: Progressing   Problem: Coping: Goal: Level of anxiety will decrease 05/06/2024 2208 by Elizeo Rodriques B, LPN Outcome: Progressing 05/06/2024 2208 by Aleighya Mcanelly B, LPN Outcome: Progressing   Problem: Safety: Goal: Ability to remain free from injury will improve Outcome: Progressing

## 2024-05-06 NOTE — Progress Notes (Signed)
 [facility] Progress Note  Name: Curtis Hartman MRN: [id number] DOB: 12-Sep-1961   Subjective:  Last HD on 7/3 with 3 kg UF.   Had 100 mL UOP over 7/3 as well as one other unmeasured urine void.  Team is getting an MRI due to confusion.  Patient doesn't remember getting dialysis here yesterday   Review of systems:  Unable to reliably obtain given AMS   Intake/Output Summary (Last 24 hours) at 05/06/2024 1032 Last data filed at 05/06/2024 0145 Gross per 24 hour  Intake 240 ml  Output 3000 ml  Net -2760 ml    Vitals:  Vitals:   05/06/24 0131 05/06/24 0145 05/06/24 0201 05/06/24 0259  BP: (!) 162/66 (!) 152/73 133/70 138/70  Pulse: 81 81 83 83  Resp: (!) 21 16 15 16   Temp:  98 F (36.7 C)  99 F (37.2 C)  TempSrc:  Oral  Oral  SpO2: 100% 100% 100% 97%  Weight:   62.3 kg      Physical Exam:  General adult male in bed in no acute distress HEENT normocephalic atraumatic extraocular movements intact sclera anicteric Neck supple trachea midline Lungs clear to auscultation bilaterally normal work of breathing at rest  Heart regular rate and rhythm no rubs or gallops appreciated Abdomen soft nontender nondistended Extremities no edema  Psych normal mood and affect Left AVF with bruit and thrill    Medications reviewed   Labs:     Latest Ref Rng & Units 05/05/2024    4:33 AM 05/04/2024    4:06 PM 01/04/2024    6:00 PM  BMP  Glucose 70 - 99 mg/dL 898  872  892   BUN 8 - 23 mg/dL 55  52  83   Creatinine 0.61 - 1.24 mg/dL 2.99  3.75  3.44   Sodium 135 - 145 mmol/L 143  143  141   Potassium 3.5 - 5.1 mmol/L 5.0  4.4  5.4   Chloride 98 - 111 mmol/L 108  107  111   CO2 22 - 32 mmol/L 23  23  20    Calcium  8.9 - 10.3 mg/dL 8.3  8.6  8.2      Assessment/Plan:   ESRD MWF:   Had HD off schedule this week after missing a tx outpatient.  Last tx on 7/3 and no urgent indication for HD today HD tomorrow here off schedule    Stop bicarbonate  I have stopped the PRN  lasix  order while here - we can reassess and would most likely resume PRN on non-HD days on discharge  Ordered renal panel for today and in AM  AMS / Weakness: Suspect that morphine  contributory.  Recommend against morphine  for chronic pain and would prefer synthetic opioids such as hydromorphone , fentanyl , methadone. Please reduce gabapentin  to no more than 300 mg daily given his ESRD. Spoke with primary team and they are actually stopping given confusion. 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 Continue lasix  as needed at home Perianal fluid collection: Per primary team   Disposition - Chart will be reviewed over the weekend.  Patient will not be seen by nephrology MD over the weekend.  Note that he is most appropriate for inpatient status given that AMS and planned MRI   Katheryn JAYSON Saba, MD 05/06/2024 10:51 AM

## 2024-05-06 NOTE — Progress Notes (Signed)
 PROGRESS NOTE   Curtis Hartman  FMW:[id number] DOB: September 18, 1961 DOA: 05/04/2024 PCP: Gerome Tillman LITTIE, FNP   Chief Complaint  Patient presents with   Loss of Consciousness   Level of care: Telemetry  Brief Admission History:   63 y.o. male with medical history significant for end-stage renal disease on hemodialysis on Monday Wednesday Fridays, COPD, type 2 diabetes mellitus, GERD, osteoarthritis, hypertension, chronic hepatitis C and peripheral neuropathy, who presented to the emergency room with acute onset of generalized weakness while he was in dialysis with unresponsiveness/syncope after which the patient was mildly confused.  No witnessed seizures.  He stated that he took 30 mg of p.o. morphine  sulfate this morning rather than 15 mg as his dose was recently increased.  He denied any fever or chills.  No chest pain or dyspnea or cough or wheezing.  No dysuria, urinary frequency or urgency, hematuria or flank pain.  He was unable to stand in the ER due to tremors in all 4 extremities.  He had diarrhea couple days ago which has resolved.  He admits to lower abdominal suprapubic pain.  He continues to smoke 5 cigarettes/day.  No paresthesias or focal muscle weakness.  No headache or dizziness or blurred vision.  No tinnitus or vertigo.  No bleeding diathesis.   ED Course: When he came to the ER, temperature was 99.5 and otherwise vital signs were within normal.  Labs revealed a BUN of 52 and creatinine 6.24, albumin  of 3.2 and CBC showed leukocytosis of 11.2 with neutrophilia and mild anemia.  GI panel was sent by the ED physician.   Imaging: Abdominal pelvic CT scan without contrast: 1. No hydronephrosis or nephrolithiasis. 2. No bowel obstruction. Normal appendix. 3. Right perianal collection or developing abscess. Ultrasound may provide better evaluation. 4.  Aortic Atherosclerosis.   Assessment and Plan:  Acute metabolic encephalopathy - associated with unresponsiveness/syncope  -  suspect related to adverse reaction to morphine  - DC morphine  -- agree with nephrologist; avoid morphine , can use hydromorphine, fentanyl , methadone for chronic pain - follow neurochecks - syncope work up in progress  -- he remains confused, not back to baseline; had dialysis treatment on 7/3, ordered MRI brain  -- DC gabapentin  due to ongoing confusion   Acute CVA  -- appreciate neurologist consultation and recommendations  -- pt starting aspirin  and plavix  therapy for secondary prevention  -- MRA ordered and pending to help guide timing for DAPT -- stroke work up ongoing -- follow up lipid panel -- pending and will plan to start statin pending LDL findings -- awaiting bedside swallow test -- smoking cessation counseling when confusion is resolved  End-stage renal disease on hemodialysis - He missed his hemodialysis session on 7/2, received inpatient HD on 7/3 - Nephrology consult appreciated  Chronic obstructive pulmonary disease (COPD)  - Will continue his inhalers.  Type 2 diabetes mellitus with peripheral neuropathy - The patient will be placed on supplemental coverage with NovoLog . - Will continue basal coverage  -- added SSI coverage and CBG check every 4 hours while NPO   Essential hypertension Will continue antihypertensive therapy Ok for normotension per neuro  Question of developing anal abscess -- examined by Dr. Francesca in ED with a chaperone and he has some small fluctuance in this area but no tenderness, erythema or warmth to suggest acute infection  -- follow up outpatient with PCP and general surgeon Dr LOISE. Teppara   DVT prophylaxis: enoxaparin   Code Status: Full  Family Communication:  Disposition: home  Consultants:  Nephrology  Procedures:  HD Antimicrobials:    Subjective: Pt remains confused, not back to baseline.   Objective: Vitals:   05/06/24 0131 05/06/24 0145 05/06/24 0201 05/06/24 0259  BP: (!) 162/66 (!) 152/73 133/70 138/70  Pulse:  81 81 83 83  Resp: (!) 21 16 15 16   Temp:  98 F (36.7 C)  99 F (37.2 C)  TempSrc:  Oral  Oral  SpO2: 100% 100% 100% 97%  Weight:   62.3 kg     Intake/Output Summary (Last 24 hours) at 05/06/2024 1202 Last data filed at 05/06/2024 0145 Gross per 24 hour  Intake 240 ml  Output 3000 ml  Net -2760 ml   Filed Weights   05/04/24 1602 05/04/24 2300 05/06/24 0201  Weight: 72.6 kg 62.8 kg 62.3 kg   Examination:  General exam: Appears calm and comfortable  Respiratory system: Clear to auscultation. Respiratory effort normal. Cardiovascular system: normal S1 & S2 heard. No JVD, murmurs, rubs, gallops or clicks. No pedal edema. Gastrointestinal system: Abdomen is nondistended, soft and nontender. No organomegaly or masses felt. Normal bowel sounds heard. Central nervous system: Alert and oriented to person, place. No focal neurological deficits. Extremities: Symmetric 5 x 5 power. Skin: No rashes, lesions or ulcers. Psychiatry: Judgement and insight appear diminished. Mood & affect appropriate.   Data Reviewed: I have personally reviewed following labs and imaging studies  CBC: Recent Labs  Lab 05/04/24 1606 05/05/24 0433  WBC 11.2* 10.6*  NEUTROABS 9.4*  --   HGB 12.1* 11.3*  HCT 37.6* 35.1*  MCV 93.8 94.1  PLT 264 286    Basic Metabolic Panel: Recent Labs  Lab 05/04/24 1606 05/05/24 0433  NA 143 143  K 4.4 5.0  CL 107 108  CO2 23 23  GLUCOSE 127* 101*  BUN 52* 55*  CREATININE 6.24* 7.00*  CALCIUM  8.6* 8.3*    CBG: Recent Labs  Lab 05/04/24 1619 05/04/24 2345 05/06/24 0257  GLUCAP 135* 84 121*    Recent Results (from the past 240 hours)  Gastrointestinal Panel by PCR , Stool     Status: None   Collection Time: 05/04/24  4:39 PM   Specimen: STOOL  Result Value Ref Range Status   Campylobacter species NOT DETECTED NOT DETECTED Final   Plesimonas shigelloides NOT DETECTED NOT DETECTED Final   Salmonella species NOT DETECTED NOT DETECTED Final   Yersinia  enterocolitica NOT DETECTED NOT DETECTED Final   Vibrio species NOT DETECTED NOT DETECTED Final   Vibrio cholerae NOT DETECTED NOT DETECTED Final   Enteroaggregative E coli (EAEC) NOT DETECTED NOT DETECTED Final   Enteropathogenic E coli (EPEC) NOT DETECTED NOT DETECTED Final   Enterotoxigenic E coli (ETEC) NOT DETECTED NOT DETECTED Final   Shiga like toxin producing E coli (STEC) NOT DETECTED NOT DETECTED Final   Shigella/Enteroinvasive E coli (EIEC) NOT DETECTED NOT DETECTED Final   Cryptosporidium NOT DETECTED NOT DETECTED Final   Cyclospora cayetanensis NOT DETECTED NOT DETECTED Final   Entamoeba histolytica NOT DETECTED NOT DETECTED Final   Giardia lamblia NOT DETECTED NOT DETECTED Final   Adenovirus F40/41 NOT DETECTED NOT DETECTED Final   Astrovirus NOT DETECTED NOT DETECTED Final   Norovirus GI/GII NOT DETECTED NOT DETECTED Final   Rotavirus A NOT DETECTED NOT DETECTED Final   Sapovirus (I, II, IV, and V) NOT DETECTED NOT DETECTED Final    Comment: Performed at [facility], [address] [place], [place] [address]     Radiology Studies:  MR BRAIN WO CONTRAST Result Date: 05/06/2024 CLINICAL DATA:  63 year old male with altered mental status. End stage renal disease. Weakness. EXAM: MRI HEAD WITHOUT CONTRAST TECHNIQUE: Multiplanar, multiecho pulse sequences of the brain and surrounding structures were obtained without intravenous contrast. COMPARISON:  None Available. FINDINGS: The examination was discontinued prior to completion by the patient. Diagnostic axial and coronal DWI, axial FLAIR and T2 * imaging only was obtained. Punctate superior posterior right frontal lobe white matter restricted diffusion on series 2, image 42 and series 3, image 42. No discrete FLAIR hyperintensity. Additional subcentimeter focus of restricted diffusion in the left lateral cerebellum on series 2, image 9. Furthermore, difficult to exclude additional punctate restricted diffusion in the medial  left parietal lobe subcortical white matter (also on image 42), right midbrain (series 2, image 25). No midline shift, mass effect, evidence of mass lesion, ventriculomegaly, extra-axial collection or acute intracranial hemorrhage. Vague but confluent periventricular white matter T2 hyperintensity. This is moderately advanced. Chronic lacunar infarct in the right pons on series 6, image 17. Additional pontine signal heterogeneity. And T2 * images do suggest chronic microhemorrhage in the left pons, also the left cerebellum. No definite cortical encephalomalacia. Chronic small vessel ischemia and microhemorrhage in the brainstem. Confluent cerebral white matter signal changes also most commonly due to small vessel disease. IMPRESSION: 1. Truncated exam. 2. Positive for multiple subcentimeter acute lacunar type infarcts, including the posterior right frontal lobe white matter, left lateral cerebellum. Possible additional punctate involvement including in the right midbrain. Negative for evidence of acute intracranial hemorrhage or mass effect. 3. Evidence of underlying moderately advanced chronic small vessel ischemia in the brain, including chronic lacunar infarcts in the right pons, chronic microhemorrhage in the left pons and left cerebellum. Electronically Signed   By: VEAR Hurst M.D.   On: 05/06/2024 11:04   ECHOCARDIOGRAM COMPLETE Result Date: 05/05/2024    ECHOCARDIOGRAM REPORT   Patient Name:   MURLE OTTING Date of Exam: 05/05/2024 Medical Rec #:  [id number]        Height:       69.0 in Accession #:    [id number]       Weight:       138.4 lb Date of Birth:  February 17, 1961        BSA:          1.767 m Patient Age:    62 years         BP:           133/70 mmHg Patient Gender: M                HR:           84 bpm. Exam Location:  Inpatient Procedure: 2D Echo, Color Doppler and Cardiac Doppler (Both Spectral and Color            Flow Doppler were utilized during procedure). Indications:    Syncope  History:         Patient has no prior history of Echocardiogram examinations.  Sonographer:    Tinnie Gosling RDCS Referring Phys: [phone] Daymian Lill L Camil Hausmann IMPRESSIONS  1. Left ventricular ejection fraction, by estimation, is 65 to 70%. The left ventricle has normal function. The left ventricle has no regional wall motion abnormalities. There is moderate concentric left ventricular hypertrophy. Left ventricular diastolic parameters were normal.  2. Right ventricular systolic function is normal. The right ventricular size is normal. Tricuspid regurgitation signal is inadequate for assessing PA pressure.  3. Left  atrial size was moderately dilated.  4. The mitral valve is degenerative. Trivial mitral valve regurgitation.  5. The aortic valve is tricuspid. There is mild calcification of the aortic valve. Aortic valve regurgitation is not visualized.  6. The inferior vena cava is normal in size with greater than 50% respiratory variability, suggesting right atrial pressure of 3 mmHg. Comparison(s): No prior Echocardiogram. FINDINGS  Left Ventricle: Left ventricular ejection fraction, by estimation, is 65 to 70%. The left ventricle has normal function. The left ventricle has no regional wall motion abnormalities. The left ventricular internal cavity size was normal in size. There is  moderate concentric left ventricular hypertrophy. Left ventricular diastolic parameters were normal. Right Ventricle: The right ventricular size is normal. No increase in right ventricular wall thickness. Right ventricular systolic function is normal. Tricuspid regurgitation signal is inadequate for assessing PA pressure. Left Atrium: Left atrial size was moderately dilated. Right Atrium: Right atrial size was normal in size. Pericardium: Trivial pericardial effusion is present. The pericardial effusion is posterior to the left ventricle. Mitral Valve: The mitral valve is degenerative in appearance. There is mild calcification of the mitral valve leaflet(s).  Mild mitral annular calcification. Trivial mitral valve regurgitation. Tricuspid Valve: The tricuspid valve is grossly normal. Tricuspid valve regurgitation is mild. Aortic Valve: The aortic valve is tricuspid. There is mild calcification of the aortic valve. There is mild aortic valve annular calcification. Aortic valve regurgitation is not visualized. Pulmonic Valve: The pulmonic valve was grossly normal. Pulmonic valve regurgitation is trivial. Aorta: The aortic root is normal in size and structure. Venous: The inferior vena cava is normal in size with greater than 50% respiratory variability, suggesting right atrial pressure of 3 mmHg. IAS/Shunts: No atrial level shunt detected by color flow Doppler. Additional Comments: 3D was performed not requiring image post processing on an independent workstation and was indeterminate.  LEFT VENTRICLE PLAX 2D LVIDd:         4.90 cm   Diastology LVIDs:         2.90 cm   LV e' medial:    9.14 cm/s LV PW:         1.50 cm   LV E/e' medial:  9.7 LV IVS:        1.50 cm   LV e' lateral:   8.16 cm/s LVOT diam:     2.50 cm   LV E/e' lateral: 10.9 LV SV:         90 LV SV Index:   51 LVOT Area:     4.91 cm  RIGHT VENTRICLE             IVC RV S prime:     20.00 cm/s  IVC diam: 1.10 cm TAPSE (M-mode): 3.5 cm LEFT ATRIUM           Index        RIGHT ATRIUM           Index LA diam:      3.50 cm 1.98 cm/m   RA Area:     13.70 cm LA Vol (A4C): 88.5 ml 50.08 ml/m  RA Volume:   32.50 ml  18.39 ml/m  AORTIC VALVE LVOT Vmax:   93.80 cm/s LVOT Vmean:  59.800 cm/s LVOT VTI:    0.184 m  AORTA Ao Root diam: 3.00 cm Ao Asc diam:  3.10 cm MITRAL VALVE MV Area (PHT): 4.29 cm    SHUNTS MV Decel Time: 177 msec    Systemic VTI:  0.18 m  MV E velocity: 88.80 cm/s  Systemic Diam: 2.50 cm MV A velocity: 77.80 cm/s MV E/A ratio:  1.14 Jayson Sierras MD Electronically signed by Jayson Sierras MD Signature Date/Time: 05/05/2024/3:49:27 PM    Final    US  Carotid Bilateral Result Date:  05/05/2024 CLINICAL DATA:  63 year old male with history of syncope and collapse. EXAM: BILATERAL CAROTID DUPLEX ULTRASOUND TECHNIQUE: Elnor scale imaging, color Doppler and duplex ultrasound were performed of bilateral carotid and vertebral arteries in the neck. COMPARISON:  None Available. FINDINGS: Criteria: Quantification of carotid stenosis is based on velocity parameters that correlate the residual internal carotid diameter with NASCET-based stenosis levels, using the diameter of the distal internal carotid lumen as the denominator for stenosis measurement. The following velocity measurements were obtained: RIGHT ICA: Peak systolic velocity 119 cm/sec, End diastolic velocity 35 cm/sec CCA: Peak systolic velocity 113 cm/sec SYSTOLIC ICA/CCA RATIO:  1.1 ECA: Peak systolic velocity 162 cm/sec LEFT ICA: Peak systolic velocity 152 cm/sec, End diastolic velocity 25 cm/sec CCA: 110 cm/sec SYSTOLIC ICA/CCA RATIO:  1.4 ECA: 4138 cm/sec RIGHT CAROTID ARTERY: Moderate scattered atherosclerotic plaque formation most prominent about the carotid bulb and bifurcation. No significant tortuosity. Normal low resistance waveforms. RIGHT VERTEBRAL ARTERY:  Antegrade flow. LEFT CAROTID ARTERY: Moderate scattered atherosclerotic plaque formationmost prominent about the carotid bulb and bifurcation. No significant tortuosity. Normal low resistance waveforms. LEFT VERTEBRAL ARTERY:  Antegrade flow. Upper extremity non-invasive blood pressures: Not obtained. IMPRESSION: 1. Right carotid artery system: Less than 50% stenosis secondary to moderate scattered atherosclerotic plaque formation, most prominent about the carotid bulb and bifurcation. 2. Left carotid artery system: Less than 50% stenosis secondary to moderate scattered atherosclerotic plaque formation, most prominent about the carotid bulb and bifurcation. 3.  Vertebral artery system: Patent with antegrade flow bilaterally. Ester Sides, MD [facility] Radiology Electronically Signed   By: Ester Sides M.D.   On: 05/05/2024 10:44   CT ABDOMEN PELVIS WO CONTRAST Result Date: 05/04/2024 CLINICAL DATA:  Abdominal pain. EXAM: CT ABDOMEN AND PELVIS WITHOUT CONTRAST TECHNIQUE: Multidetector CT imaging of the abdomen and pelvis was performed following the standard protocol without IV contrast. RADIATION DOSE REDUCTION: This exam was performed according to the departmental dose-optimization program which includes automated exposure control, adjustment of the mA and/or kV according to patient size and/or use of iterative reconstruction technique. COMPARISON:  None Available. FINDINGS: Evaluation of this exam is limited in the absence of intravenous contrast. Evaluation is also limited due to streak artifact caused by patient's arms and spinal hardware. Lower chest: The visualized lung bases are clear. There is coronary vascular calcification. No intra-abdominal free air or free fluid. Hepatobiliary: The liver is unremarkable. No biliary dilatation. The gallbladder is unremarkable. Pancreas: Unremarkable. No pancreatic ductal dilatation or surrounding inflammatory changes. Spleen: Normal in size without focal abnormality. Adrenals/Urinary Tract: The right adrenal gland is unremarkable. A 2 cm left adrenal adenoma. There is no hydronephrosis or nephrolithiasis on either side. The visualized ureters and urinary bladder appear unremarkable. Stomach/Bowel: There is no bowel obstruction or active inflammation. The appendix is normal. Vascular/Lymphatic: Moderate aortoiliac atherosclerotic disease. The IVC is unremarkable. No portal venous gas. There is no adenopathy. Reproductive: The prostate and seminal vesicles are grossly unremarkable. No pelvic mass. Other: Partially visualized 2.5 x 5.0 cm indurated area in the subcutaneous soft tissues of the right buttock adjacent to the intergluteal cleft suspicious for a perianal collection or developing  abscess. Ultrasound may provide better evaluation. Musculoskeletal: Osteopenia with degenerative changes  of the spine. Multilevel posterior fusion hardware. No acute osseous pathology. IMPRESSION: 1. No hydronephrosis or nephrolithiasis. 2. No bowel obstruction. Normal appendix. 3. Right perianal collection or developing abscess. Ultrasound may provide better evaluation. 4.  Aortic Atherosclerosis (ICD10-I70.0). Electronically Signed   By: Vanetta Chou M.D.   On: 05/04/2024 17:44   DG Chest Port 1 View Result Date: 05/04/2024 CLINICAL DATA:  106001 Syncope 106001 EXAM: PORTABLE CHEST - 1 VIEW COMPARISON:  None available. FINDINGS: No focal airspace consolidation, pleural effusion, or pneumothorax. No cardiomegaly. Aortic atherosclerosis. No acute fracture or destructive lesions. Multilevel thoracic osteophytosis. Partially visualized cervical fusion hardware IMPRESSION: No acute cardiopulmonary abnormality. Electronically Signed   By: Rogelia Myers M.D.   On: 05/04/2024 16:32   Scheduled Meds:  [START ON 05/07/2024]  stroke: early stages of recovery book   Does not apply Once   [START ON 05/07/2024] aspirin   81 mg Oral Daily   aspirin   325 mg Oral Once   carvedilol   3.125 mg Oral BID WC   Chlorhexidine  Gluconate Cloth  6 each Topical Q0600   clopidogrel   75 mg Oral Daily   enoxaparin  (LOVENOX ) injection  30 mg Subcutaneous Q24H   fluticasone   2 spray Each Nare Daily   insulin  aspart  0-6 Units Subcutaneous TID WC   insulin  glargine-yfgn  5 Units Subcutaneous QHS   oxybutynin   10 mg Oral Daily   Continuous Infusions:   LOS: 0 days   Time spent: 64 mins  Eneida Evers Vicci, MD How to contact the [facility] Attending or Consulting provider 7A - 7P or covering provider during after hours 7P -7A, for this patient?  Check the care team in [facility] and look for a) attending/consulting [facility] provider listed and b) the TRH team listed Log into [email or web address] to find provider on call.  Locate the [facility] provider you  are looking for under [facility] and page to a number that you can be directly reached. If you still have difficulty reaching the provider, please page the [facility] (Director on Call) for the Hospitalists listed on amion for assistance.  05/06/2024, 12:02 PM

## 2024-05-07 ENCOUNTER — Other Ambulatory Visit: Payer: Self-pay

## 2024-05-07 ENCOUNTER — Inpatient Hospital Stay (HOSPITAL_COMMUNITY)

## 2024-05-07 DIAGNOSIS — E1142 Type 2 diabetes mellitus with diabetic polyneuropathy: Secondary | ICD-10-CM | POA: Diagnosis not present

## 2024-05-07 DIAGNOSIS — K610 Anal abscess: Secondary | ICD-10-CM | POA: Diagnosis not present

## 2024-05-07 DIAGNOSIS — J449 Chronic obstructive pulmonary disease, unspecified: Secondary | ICD-10-CM | POA: Diagnosis not present

## 2024-05-07 DIAGNOSIS — G9341 Metabolic encephalopathy: Secondary | ICD-10-CM | POA: Diagnosis not present

## 2024-05-07 DIAGNOSIS — N186 End stage renal disease: Secondary | ICD-10-CM | POA: Diagnosis not present

## 2024-05-07 LAB — CBC
HCT: 33.7 % — ABNORMAL LOW (ref 39.0–52.0)
Hemoglobin: 10.4 g/dL — ABNORMAL LOW (ref 13.0–17.0)
MCH: 29.1 pg (ref 26.0–34.0)
MCHC: 30.9 g/dL (ref 30.0–36.0)
MCV: 94.1 fL (ref 80.0–100.0)
Platelets: 281 10*3/uL (ref 150–400)
RBC: 3.58 MIL/uL — ABNORMAL LOW (ref 4.22–5.81)
RDW: 14.4 % (ref 11.5–15.5)
WBC: 14.5 10*3/uL — ABNORMAL HIGH (ref 4.0–10.5)
nRBC: 0 % (ref 0.0–0.2)

## 2024-05-07 LAB — GLUCOSE, CAPILLARY
Glucose-Capillary: 133 mg/dL — ABNORMAL HIGH (ref 70–99)
Glucose-Capillary: 145 mg/dL — ABNORMAL HIGH (ref 70–99)
Glucose-Capillary: 229 mg/dL — ABNORMAL HIGH (ref 70–99)
Glucose-Capillary: 85 mg/dL (ref 70–99)
Glucose-Capillary: 90 mg/dL (ref 70–99)
Glucose-Capillary: 91 mg/dL (ref 70–99)

## 2024-05-07 LAB — RENAL FUNCTION PANEL
Albumin: 2.7 g/dL — ABNORMAL LOW (ref 3.5–5.0)
Anion gap: 10 (ref 5–15)
BUN: 49 mg/dL — ABNORMAL HIGH (ref 8–23)
CO2: 25 mmol/L (ref 22–32)
Calcium: 8.3 mg/dL — ABNORMAL LOW (ref 8.9–10.3)
Chloride: 98 mmol/L (ref 98–111)
Creatinine, Ser: 6.3 mg/dL — ABNORMAL HIGH (ref 0.61–1.24)
GFR, Estimated: 9 mL/min — ABNORMAL LOW (ref >60)
Glucose, Bld: 70 mg/dL (ref 70–99)
Phosphorus: 6.4 mg/dL — ABNORMAL HIGH (ref 2.5–4.6)
Potassium: 4.5 mmol/L (ref 3.5–5.1)
Sodium: 133 mmol/L — ABNORMAL LOW (ref 135–145)

## 2024-05-07 LAB — RPR: RPR Ser Ql: NONREACTIVE

## 2024-05-07 MED ORDER — PIPERACILLIN-TAZOBACTAM 3.375 G IVPB
3.3750 g | Freq: Two times a day (BID) | INTRAVENOUS | Status: DC
  Administered 2024-05-07 – 2024-05-10 (×7): 3.375 g via INTRAVENOUS
  Filled 2024-05-07 (×7): qty 50

## 2024-05-07 MED ORDER — LIDOCAINE HCL (PF) 1 % IJ SOLN
30.0000 mL | Freq: Once | INTRAMUSCULAR | Status: AC
  Administered 2024-05-07: 30 mL via INTRADERMAL
  Filled 2024-05-07: qty 30

## 2024-05-07 MED ORDER — HYDROMORPHONE HCL 1 MG/ML IJ SOLN
0.5000 mg | Freq: Once | INTRAMUSCULAR | Status: AC
  Administered 2024-05-07: 0.5 mg via INTRAVENOUS
  Filled 2024-05-07: qty 0.5

## 2024-05-07 NOTE — Progress Notes (Signed)
 The patient tolerated dialysis treatment well. Net UF off 1.5 L. Patient had 3 times bowel movement during tx. Patient alert and stable.  05/07/24 1950  Vitals  Temp 98.4 F (36.9 C)  Temp Source Oral  BP (!) 147/78  BP Location Right Arm  BP Method Automatic  Patient Position (if appropriate) Lying  Pulse Rate 74  Resp 18  Oxygen Therapy  SpO2 100 %  O2 Device Room Air  During Treatment Monitoring  Intra-Hemodialysis Comments Tx completed  Post Treatment  Dialyzer Clearance Lightly streaked  Hemodialysis Intake (mL) 0 mL  Liters Processed 70  Fluid Removed (mL) 1500 mL  Tolerated HD Treatment Yes  Post-Hemodialysis Comments goal met.  AVG/AVF Arterial Site Held (minutes) 10 minutes  AVG/AVF Venous Site Held (minutes) 10 minutes  Fistula / Graft Left Forearm Arteriovenous fistula  Placement Date/Time: 09/10/23 1209   Placed prior to admission: No  Orientation: Left  Access Location: Forearm  Access Type: Arteriovenous fistula  Site Condition No complications  Fistula / Graft Assessment Present;Thrill;Bruit  Status Deaccessed  Needle Size 15  Drainage Description None

## 2024-05-07 NOTE — Progress Notes (Signed)
Inpatient Rehab Admissions Coordinator:   Per therapy recommendations, patient was screened for CIR candidacy by Bethann Qualley, MS, CCC-SLP. At this time, Pt. does not appear to demonstrate medical necessity to justify in hospital rehabilitation/CIR. will not pursue a rehab consult for this Pt.   Recommend other rehab venues to be pursued.  Please contact me with any questions.  Kerrilyn Azbill, MS, CCC-SLP Rehab Admissions Coordinator  [phone] (celll) [phone] (office)   

## 2024-05-07 NOTE — Progress Notes (Signed)
 PROGRESS NOTE   Curtis Hartman  FMW:[id number] DOB: [birth year] DOA: 05/04/2024 PCP: Gerome Tillman LITTIE, FNP   Chief Complaint  Patient presents with   Loss of Consciousness   Level of care: Telemetry  Brief Admission History:   63 y.o. male with medical history significant for end-stage renal disease on hemodialysis on Monday Wednesday Fridays, COPD, type 2 diabetes mellitus, GERD, osteoarthritis, hypertension, chronic hepatitis C and peripheral neuropathy, who presented to the emergency room with acute onset of generalized weakness while he was in dialysis with unresponsiveness/syncope after which the patient was mildly confused.  No witnessed seizures.  He stated that he took 30 mg of p.o. morphine  sulfate this morning rather than 15 mg as his dose was recently increased.  He denied any fever or chills.  No chest pain or dyspnea or cough or wheezing.  No dysuria, urinary frequency or urgency, hematuria or flank pain.  He was unable to stand in the ER due to tremors in all 4 extremities.  He had diarrhea couple days ago which has resolved.  He admits to lower abdominal suprapubic pain.  He continues to smoke 5 cigarettes/day.  No paresthesias or focal muscle weakness.  No headache or dizziness or blurred vision.  No tinnitus or vertigo.  No bleeding diathesis.   ED Course: When he came to the ER, temperature was 99.5 and otherwise vital signs were within normal.  Labs revealed a BUN of 52 and creatinine 6.24, albumin  of 3.2 and CBC showed leukocytosis of 11.2 with neutrophilia and mild anemia.  GI panel was sent by the ED physician.   Imaging: Abdominal pelvic CT scan without contrast: 1. No hydronephrosis or nephrolithiasis. 2. No bowel obstruction. Normal appendix. 3. Right perianal collection or developing abscess. Ultrasound may provide better evaluation. 4.  Aortic Atherosclerosis.   Assessment and Plan:  Acute metabolic encephalopathy--IMPROVING - associated with  unresponsiveness/syncope - suspect related to adverse reaction to morphine  - DC morphine  -- agree with nephrologist; avoid morphine , can use hydromorphine, fentanyl , methadone for chronic pain - follow neurochecks  -- he is less confused, closer to baseline; had dialysis treatment on 7/3, ordered MRI brain on 7/4  -- DC gabapentin  due to ongoing confusion   Acute CVA  -- appreciate neurologist consultation and recommendations  -- pt starting aspirin  and plavix  therapy for secondary prevention  -- MRA ordered and no findings of severe intracranial stenosis, therefore recommendation is for 3 weeks of DAPT, followed by aspirin  81 mg daily  -- stroke work  -- follow up lipid panel -- LDL is optimal at 67 therefore statin was NOT started  -- bedside swallow test was reassuring and placed on dysphagia diet for now -- smoking cessation counseling at bedside  End-stage renal disease on hemodialysis - He missed his hemodialysis session on 7/2, received inpatient HD on 7/3 - Nephrology consult appreciated  Chronic obstructive pulmonary disease (COPD)  - Will continue his inhalers.  Type 2 diabetes mellitus with peripheral neuropathy - The patient will be placed on supplemental coverage with NovoLog . - Will continue basal coverage  -- added SSI coverage and CBG check every 4 hours while NPO  CBG (last 3)  Recent Labs    05/07/24 0749 05/07/24 1140 05/07/24 1555  GLUCAP 85 91 133*   Essential hypertension Will continue antihypertensive therapy Ok for normotension per neuro  Question of developing anal abscess -- examined by Dr. Francesca in ED with a chaperone and he has some small fluctuance in this area but no  tenderness, erythema or warmth to suggest acute infection  -- given his AMS and increasing WBC and fever, I asked for surgery consult, pt had I&D at bedside and now on zosyn    DVT prophylaxis: enoxaparin   Code Status: Full  Family Communication: I called his sister and updated   Disposition: home    Consultants:  Nephrology  Procedures:  HD Antimicrobials:    Subjective: Pt remains confused, not back to baseline.   Objective: Vitals:   05/06/24 2237 05/07/24 0038 05/07/24 0409 05/07/24 1334  BP:  (!) 149/76 (!) 149/64 104/65  Pulse:  74 73 64  Resp:  16 16 16   Temp: 99.3 F (37.4 C) 99.7 F (37.6 C) 98.6 F (37 C) 98.4 F (36.9 C)  TempSrc: Oral Oral Oral Oral  SpO2:  100% 99% 100%  Weight:        Intake/Output Summary (Last 24 hours) at 05/07/2024 1611 Last data filed at 05/07/2024 1540 Gross per 24 hour  Intake 269.87 ml  Output --  Net 269.87 ml   Filed Weights   05/04/24 1602 05/04/24 2300 05/06/24 0201  Weight: 72.6 kg 62.8 kg 62.3 kg   Examination:  General exam: Appears calm and comfortable  Respiratory system: Clear to auscultation. Respiratory effort normal. Cardiovascular system: normal S1 & S2 heard. No JVD, murmurs, rubs, gallops or clicks. No pedal edema. Gastrointestinal system: Abdomen is nondistended, soft and nontender. No organomegaly or masses felt. Normal bowel sounds heard. Central nervous system: Alert and oriented to person, place. No focal neurological deficits. Extremities: Symmetric 5 x 5 power. Skin: No rashes, lesions or ulcers. Psychiatry: Judgement and insight appear diminished. Mood & affect appropriate.   Data Reviewed: I have personally reviewed following labs and imaging studies  CBC: Recent Labs  Lab 05/04/24 1606 05/05/24 0433 05/07/24 0301  WBC 11.2* 10.6* 14.5*  NEUTROABS 9.4*  --   --   HGB 12.1* 11.3* 10.4*  HCT 37.6* 35.1* 33.7*  MCV 93.8 94.1 94.1  PLT 264 286 281    Basic Metabolic Panel: Recent Labs  Lab 05/04/24 1606 05/05/24 0433 05/06/24 1137 05/07/24 0301  NA 143 143 134* 133*  K 4.4 5.0 4.6 4.5  CL 107 108 97* 98  CO2 23 23 27 25   GLUCOSE 127* 101* 95 70  BUN 52* 55* 36* 49*  CREATININE 6.24* 7.00* 5.16* 6.30*  CALCIUM  8.6* 8.3* 8.4* 8.3*  PHOS  --   --  5.5* 6.4*     CBG: Recent Labs  Lab 05/07/24 0040 05/07/24 0412 05/07/24 0749 05/07/24 1140 05/07/24 1555  GLUCAP 145* 90 85 91 133*    Recent Results (from the past 240 hours)  Gastrointestinal Panel by PCR , Stool     Status: None   Collection Time: 05/04/24  4:39 PM   Specimen: STOOL  Result Value Ref Range Status   Campylobacter species NOT DETECTED NOT DETECTED Final   Plesimonas shigelloides NOT DETECTED NOT DETECTED Final   Salmonella species NOT DETECTED NOT DETECTED Final   Yersinia enterocolitica NOT DETECTED NOT DETECTED Final   Vibrio species NOT DETECTED NOT DETECTED Final   Vibrio cholerae NOT DETECTED NOT DETECTED Final   Enteroaggregative E coli (EAEC) NOT DETECTED NOT DETECTED Final   Enteropathogenic E coli (EPEC) NOT DETECTED NOT DETECTED Final   Enterotoxigenic E coli (ETEC) NOT DETECTED NOT DETECTED Final   Shiga like toxin producing E coli (STEC) NOT DETECTED NOT DETECTED Final   Shigella/Enteroinvasive E coli (EIEC) NOT DETECTED NOT DETECTED Final  Cryptosporidium NOT DETECTED NOT DETECTED Final   Cyclospora cayetanensis NOT DETECTED NOT DETECTED Final   Entamoeba histolytica NOT DETECTED NOT DETECTED Final   Giardia lamblia NOT DETECTED NOT DETECTED Final   Adenovirus F40/41 NOT DETECTED NOT DETECTED Final   Astrovirus NOT DETECTED NOT DETECTED Final   Norovirus GI/GII NOT DETECTED NOT DETECTED Final   Rotavirus A NOT DETECTED NOT DETECTED Final   Sapovirus (I, II, IV, and V) NOT DETECTED NOT DETECTED Final    Comment: Performed at [facility], [address] [place], [place] [address]     Radiology Studies: DG CHEST PORT 1 VIEW Result Date: 05/07/2024 CLINICAL DATA:  Fever. EXAM: PORTABLE CHEST 1 VIEW COMPARISON:  Radiographs 05/04/2024. FINDINGS: 0805 hours. The heart size and mediastinal contours are normal. The lungs are clear. There is no pleural effusion or pneumothorax. No acute osseous findings are identified. Mild degenerative changes in  the spine. Previous lower cervical fusion, partially imaged. IMPRESSION: No evidence of acute cardiopulmonary process. Electronically Signed   By: Elsie Perone M.D.   On: 05/07/2024 12:08   MR ANGIO HEAD WO CONTRAST Result Date: 05/06/2024 CLINICAL DATA:  Stroke follow-up EXAM: MRA HEAD WITHOUT CONTRAST TECHNIQUE: Angiographic images of the Circle of Willis were acquired using MRA technique without intravenous contrast. COMPARISON:  MRI head earlier same day. FINDINGS: Anterior circulation: The intracranial internal carotid arteries are patent. Atherosclerosis noted along the bilateral cavernous ICAs without evidence of high-grade stenosis. The right M1 segment and right MCA bifurcation are patent. Proximal right M2 branches are patent. Distal right MCA branches are patent. The left M1 segment and left MCA bifurcation are patent. Proximal left M2 branches are patent. Distal left MCA branches are patent. The A1 and A2 segments are patent bilaterally. Distal ACA branches are symmetric. Posterior circulation: The visualized intracranial vertebral arteries are patent. Basilar artery is patent. The bilateral posterior cerebral arteries are patent. Superior cerebellar arteries are patent bilaterally. The PICA patent bilaterally. Anatomic variants: None significant Other: None. IMPRESSION: Patent intracranial arterial vasculature. Atherosclerosis of the bilateral cavernous ICAs without high-grade stenosis. Electronically Signed   By: Donnice Mania M.D.   On: 05/06/2024 17:00   MR BRAIN WO CONTRAST Result Date: 05/06/2024 CLINICAL DATA:  63 year old male with altered mental status. End stage renal disease. Weakness. EXAM: MRI HEAD WITHOUT CONTRAST TECHNIQUE: Multiplanar, multiecho pulse sequences of the brain and surrounding structures were obtained without intravenous contrast. COMPARISON:  None Available. FINDINGS: The examination was discontinued prior to completion by the patient. Diagnostic axial and coronal  DWI, axial FLAIR and T2 * imaging only was obtained. Punctate superior posterior right frontal lobe white matter restricted diffusion on series 2, image 42 and series 3, image 42. No discrete FLAIR hyperintensity. Additional subcentimeter focus of restricted diffusion in the left lateral cerebellum on series 2, image 9. Furthermore, difficult to exclude additional punctate restricted diffusion in the medial left parietal lobe subcortical white matter (also on image 42), right midbrain (series 2, image 25). No midline shift, mass effect, evidence of mass lesion, ventriculomegaly, extra-axial collection or acute intracranial hemorrhage. Vague but confluent periventricular white matter T2 hyperintensity. This is moderately advanced. Chronic lacunar infarct in the right pons on series 6, image 17. Additional pontine signal heterogeneity. And T2 * images do suggest chronic microhemorrhage in the left pons, also the left cerebellum. No definite cortical encephalomalacia. Chronic small vessel ischemia and microhemorrhage in the brainstem. Confluent cerebral white matter signal changes also most commonly due to small vessel disease.  IMPRESSION: 1. Truncated exam. 2. Positive for multiple subcentimeter acute lacunar type infarcts, including the posterior right frontal lobe white matter, left lateral cerebellum. Possible additional punctate involvement including in the right midbrain. Negative for evidence of acute intracranial hemorrhage or mass effect. 3. Evidence of underlying moderately advanced chronic small vessel ischemia in the brain, including chronic lacunar infarcts in the right pons, chronic microhemorrhage in the left pons and left cerebellum. Electronically Signed   By: VEAR Hurst M.D.   On: 05/06/2024 11:04   Scheduled Meds:  aspirin   81 mg Oral Daily   carvedilol   3.125 mg Oral BID WC   Chlorhexidine  Gluconate Cloth  6 each Topical Q0600   clopidogrel   75 mg Oral Daily   enoxaparin  (LOVENOX ) injection  30  mg Subcutaneous Q24H   fluticasone   2 spray Each Nare Daily   insulin  aspart  0-6 Units Subcutaneous TID WC   insulin  glargine-yfgn  5 Units Subcutaneous QHS   oxybutynin   10 mg Oral Daily   Continuous Infusions:  piperacillin -tazobactam (ZOSYN )  IV 12.5 mL/hr at 05/07/24 1540     LOS: 1 day   Time spent: 50 mins  Maisee Vollman Vicci, MD How to contact the [facility] Attending or Consulting provider 7A - 7P or covering provider during after hours 7P -7A, for this patient?  Check the care team in [facility] and look for a) attending/consulting [facility] provider listed and b) the [facility] team listed Log into [email or web address] to find provider on call.  Locate the [facility] provider you are looking for under [facility] and page to a number that you can be directly reached. If you still have difficulty reaching the provider, please page the [facility] (Director on Call) for the Hospitalists listed on amion for assistance.  05/07/2024, 4:11 PM

## 2024-05-07 NOTE — Plan of Care (Signed)
  Problem: Acute Rehab PT Goals(only PT should resolve) Goal: Pt Will Go Supine/Side To Sit Outcome: Progressing Flowsheets (Taken 05/07/2024 1447) Pt will go Supine/Side to Sit:  with modified independence  with supervision Goal: Patient Will Transfer Sit To/From Stand Outcome: Progressing Flowsheets (Taken 05/07/2024 1447) Patient will transfer sit to/from stand:  with supervision  with contact guard assist Goal: Pt Will Transfer Bed To Chair/Chair To Bed Outcome: Progressing Flowsheets (Taken 05/07/2024 1447) Pt will Transfer Bed to Chair/Chair to Bed:  with supervision  with contact guard assist Goal: Pt Will Ambulate Outcome: Progressing Flowsheets (Taken 05/07/2024 1447) Pt will Ambulate:  75 feet  with contact guard assist  with supervision   2:47 PM, 05/07/24 Lynwood Music, MPT Physical Therapist with [facility] [phone] office [phone] mobile phone

## 2024-05-07 NOTE — Consult Note (Signed)
 [facility] Consult  Reason for Consult: Perianal abscess  Referring Physician: Dr. Vicci  Chief Complaint   Loss of Consciousness     HPI: Curtis Hartman is a 63 y.o. male with multiple medical issues including ESRD On dialysis, HTN, diabetes, recent CVA, on plavix . He had worsening leukocytosis and pain in the rectum. He had a CT on admission 7/2 with some concern for a perianal abscess developing but the ED did not note anything on exam. He describes some discomfort in the area.  He never had a perianal abscess before. He did fall on his butt and started hurting after that fall.    Past Medical History:  Diagnosis Date   Arthritis    CKD (chronic kidney disease)    COPD (chronic obstructive pulmonary disease) (HCC)    Diabetes mellitus without complication (HCC)    type 2   GERD (gastroesophageal reflux disease)    Headache    Hx Migraines   Hepatitis    Hypertension    Neuropathy     Past Surgical History:  Procedure Laterality Date   ANTERIOR CERVICAL DECOMP/DISCECTOMY FUSION N/A 11/05/2022   Procedure: Anterior Cervical Decompression/discectomy Fusion Cervical Four-Cervical Five;  Surgeon: Debby Dorn MATSU, MD;  Location: [facility] OR;  Service: Neurosurgery;  Laterality: N/A;  3C   ANTERIOR LAT LUMBAR FUSION N/A 09/01/2023   Procedure: DIRECT LUMBAR INTERBODY FUSION, LUMBAR TWO-LUMBAR THREE, LUMBAR THREE-LUMBAR FOUR, LUMBAR FOUR-LUMBAR FIVE, RIGHT PRONE TRANSPSOAS, LUMBAR TWO LUMBAR FIVE POSTERIOR PERCUTANEOUS FUSION LUMBAR FOUR-LUMBAR FIVE MINIMALLY INVASIVE LAMINECTOMY, FORAMINOTOMY;  Surgeon: Debby Dorn MATSU, MD;  Location: [facility] OR;  Service: Neurosurgery;  Laterality: N/A;   AV FISTULA PLACEMENT Left 09/10/2023   Procedure: LEFT ARM ARTERIOVENOUS (AV) FISTULA CREATION;  Surgeon: Pearline Norman RAMAN, MD;  Location: [facility] OR;  Service: Vascular;  Laterality: Left;   BACK SURGERY  2023   Cervical Spine   BIOPSY  03/03/2022   Procedure: BIOPSY;  Surgeon:  Cindie Carlin POUR, DO;  Location: [facility] END[facility] SUITE;  Service: Endoscopy;;   COLONOSCOPY WITH PROPOFOL  N/A 03/03/2022   Procedure: COLONOSCOPY WITH PROPOFOL ;  Surgeon: Cindie Carlin POUR, DO;  Location: [facility]ITE;  Service: Endoscopy;  Laterality: N/A;  8:00am   ESOPHAGOGASTRODUODENOSCOPY (EGD) WITH PROPOFOL  N/A 03/03/2022   Procedure: ESOPHAGOGASTRODUODENOSCOPY (EGD) WITH PROPOFOL ;  Surgeon: Cindie Carlin POUR, DO;  Location: [facility];  Service: Endoscopy;  Laterality: N/A;   foot right partial toe amputation Right    FOOT SURGERY Right    x3   hernia surgery x4     LUMBAR PERCUTANEOUS PEDICLE SCREW 3 LEVEL N/A 09/01/2023   Procedure: L2-5 LUMBAR PERCUTANEOUS PEDICLE SCREW 3 LEVEL;  Surgeon: Debby Dorn MATSU, MD;  Location: [facility] OR;  Service: Neurosurgery;  Laterality: N/A;   POLYPECTOMY  03/03/2022   Procedure: POLYPECTOMY;  Surgeon: Cindie Carlin POUR, DO;  Location: [facility];  Service: Endoscopy;;    Family History  Problem Relation Age of Onset   Heart disease Mother    Seizures Father    Stroke Maternal Grandfather    Liver disease Paternal Grandmother     Social History   Tobacco Use   Smoking status: Every Day    Current packs/day: 0.25    Types: Cigarettes    Passive exposure: Never   Smokeless tobacco: Never   Tobacco comments:    Pt smokes a couple cigarettes a day (as of 08/26/23)  Vaping Use   Vaping status: Never Used  Substance Use Topics   Alcohol  use: Not Currently   Drug use: Not Currently    Medications: I have reviewed the patient's current medications. Prior to Admission:  Medications Prior to Admission  Medication Sig Dispense Refill Last Dose/Taking   acetaminophen  (TYLENOL ) 325 MG tablet Take 1-2 tablets (325-650 mg total) by mouth every 4 (four) hours as needed for mild pain (pain score 1-3).   05/04/2024 Morning   albuterol  (PROAIR  HFA) 108 (90 Base) MCG/ACT inhaler 2 puffs every 4 hours as needed only  if your can't catch your breath 18 g  11 Past Week   amLODipine  (NORVASC ) 10 MG tablet Take 10 mg by mouth daily as needed (diastolic blood pressure above 90).   Past Week   Budeson-Glycopyrrol-Formoterol  (BREZTRI  AEROSPHERE) 160-9-4.8 MCG/ACT AERO Inhale 2 puffs into the lungs in the morning and at bedtime. (Patient taking differently: Inhale 2 puffs into the lungs 2 (two) times daily as needed (shortness of breath).)   Past Week   carvedilol  (COREG ) 3.125 MG tablet Take 1 tablet (3.125 mg total) by mouth 2 (two) times daily with a meal. 60 tablet 0 05/04/2024 Morning   diphenoxylate -atropine  (LOMOTIL ) 2.5-0.025 MG tablet Take 1 tablet by mouth 4 (four) times daily.   Past Week   fluticasone  (FLONASE ) 50 MCG/ACT nasal spray Place 2 sprays into both nostrils daily.   Past Week   furosemide  (LASIX ) 80 MG tablet Take 80 mg by mouth daily as needed for edema or fluid.   Past Week   gabapentin  (NEURONTIN ) 400 MG capsule Take 400 mg by mouth 2 (two) times daily.   Past Week   hydrOXYzine  (ATARAX ) 25 MG tablet Take 25 mg by mouth daily.   05/04/2024 Morning   LANTUS  100 UNIT/ML injection Inject 5 Units into the skin at bedtime as needed (blood sugar over 150).   Past Month   morphine  (MSIR) 30 MG tablet Take 30 mg by mouth 2 (two) times daily as needed for moderate pain (pain score 4-6).   05/04/2024 Morning   oxybutynin  (DITROPAN -XL) 10 MG 24 hr tablet Take 10 mg by mouth daily.   Past Week   sodium bicarbonate  650 MG tablet Take 1 tablet (650 mg total) by mouth 3 (three) times daily. 90 tablet 0 05/04/2024 Morning   Scheduled:  aspirin   81 mg Oral Daily   carvedilol   3.125 mg Oral BID WC   Chlorhexidine  Gluconate Cloth  6 each Topical Q0600   clopidogrel   75 mg Oral Daily   enoxaparin  (LOVENOX ) injection  30 mg Subcutaneous Q24H   fluticasone   2 spray Each Nare Daily   insulin  aspart  0-6 Units Subcutaneous TID WC   insulin  glargine-yfgn  5 Units Subcutaneous QHS   oxybutynin   10 mg Oral Daily   Continuous:  piperacillin -tazobactam (ZOSYN )   IV 3.375 g (05/07/24 1251)   PRN:acetaminophen  **OR** acetaminophen , albuterol , amLODipine , diphenoxylate -atropine , HYDROmorphone , hydrOXYzine , ondansetron  **OR** ondansetron  (ZOFRAN ) IV, traZODone   No Known Allergies   ROS:  A comprehensive review of systems was negative except for: Cardiovascular: positive for CVA Gastrointestinal: positive for perianal pain, diarrhea Genitourinary: positive for ESRD  Blood pressure 104/65, pulse 64, temperature 98.4 F (36.9 C), temperature source Oral, resp. rate 16, weight 62.3 kg, SpO2 100%. Physical Exam Vitals reviewed. Exam conducted with a chaperone present.  Constitutional:      Appearance: He is underweight.  HENT:     Nose: Nose normal.  Cardiovascular:     Rate and Rhythm: Normal rate.  Pulmonary:     Effort: Pulmonary effort is  normal.  Abdominal:     General: There is no distension.     Palpations: Abdomen is soft.     Tenderness: There is no abdominal tenderness.     Comments: Perianal exam with fluctuance of the right perianal region   Skin:    General: Skin is warm.  Neurological:     General: No focal deficit present.     Mental Status: He is alert and oriented to person, place, and time.  Psychiatric:        Mood and Affect: Mood normal.     Results: Results for orders placed or performed during the hospital encounter of 05/04/24 (from the past 48 hours)  Hepatitis B surface antigen     Status: None   Collection Time: 05/06/24  2:36 AM  Result Value Ref Range   Hepatitis B Surface Ag NON REACTIVE NON REACTIVE    Comment: Performed at [facility], [address], [place], [place] [address]  Glucose, capillary     Status: Abnormal   Collection Time: 05/06/24  2:57 AM  Result Value Ref Range   Glucose-Capillary 121 (H) 70 - 99 mg/dL    Comment: Glucose reference range applies only to samples taken after fasting for at least 8 hours.  Renal function panel     Status: Abnormal   Collection Time: 05/06/24  11:37 AM  Result Value Ref Range   Sodium 134 (L) 135 - 145 mmol/L    Comment: DELTA CHECK NOTED   Potassium 4.6 3.5 - 5.1 mmol/L   Chloride 97 (L) 98 - 111 mmol/L   CO2 27 22 - 32 mmol/L   Glucose, Bld 95 70 - 99 mg/dL    Comment: Glucose reference range applies only to samples taken after fasting for at least 8 hours.   BUN 36 (H) 8 - 23 mg/dL   Creatinine, Ser 4.83 (H) 0.61 - 1.24 mg/dL   Calcium  8.4 (L) 8.9 - 10.3 mg/dL   Phosphorus 5.5 (H) 2.5 - 4.6 mg/dL   Albumin  2.7 (L) 3.5 - 5.0 g/dL   GFR, Estimated 12 (L) >60 mL/min    Comment: (NOTE) Calculated using the CKD-EPI Creatinine Equation (2021)    Anion gap 10 5 - 15    Comment: Performed at [facility], [address], [place], [place] [address]  Hemoglobin A1c     Status: None   Collection Time: 05/06/24 11:37 AM  Result Value Ref Range   Hgb A1c MFr Bld 4.9 4.8 - 5.6 %    Comment: (NOTE) Diagnosis of Diabetes The following HbA1c ranges recommended by the American Diabetes Association (ADA) may be used as an aid in the diagnosis of diabetes mellitus.  Hemoglobin             Suggested A1C NGSP%              Diagnosis  <5.7                   Non Diabetic  5.7-6.4                Pre-Diabetic  >6.4                   Diabetic  <7.0                   Glycemic control for  adults with diabetes.     Mean Plasma Glucose 93.93 mg/dL    Comment: Performed at [facility], [address] [place], [place] [address]  Lipid panel     Status: None   Collection Time: 05/06/24 11:37 AM  Result Value Ref Range   Cholesterol 123 0 - 200 mg/dL   Triglycerides 71 <849 mg/dL   HDL 42 >59 mg/dL   Total CHOL/HDL Ratio 2.9 RATIO   VLDL 14 0 - 40 mg/dL   LDL Cholesterol 67 0 - 99 mg/dL    Comment:        Total Cholesterol/HDL:CHD Risk Coronary Heart Disease Risk Table                     Men   Women  1/2 Average Risk   3.4   3.3  Average Risk       5.0   4.4  2 X Average Risk   9.6   7.1  3 X  Average Risk  23.4   11.0        Use the calculated Patient Ratio above and the CHD Risk Table to determine the patient's CHD Risk.        ATP III CLASSIFICATION (LDL):  <100     mg/dL   Optimal  899-870  mg/dL   Near or Above                    Optimal  130-159  mg/dL   Borderline  839-810  mg/dL   High  >809     mg/dL   Very High Performed at [facility], [address] [place], [place] [address]   Ammonia     Status: None   Collection Time: 05/06/24 12:11 PM  Result Value Ref Range   Ammonia 14 9 - 35 umol/L    Comment: Performed at [facility], [address], [place], [place] [address]  Vitamin B12     Status: None   Collection Time: 05/06/24 12:11 PM  Result Value Ref Range   Vitamin B-12 361 180 - 914 pg/mL    Comment: (NOTE) This assay is not validated for testing neonatal or myeloproliferative syndrome specimens for Vitamin B12 levels. Performed at [facility], [address] [place], [place] [address]   Folate     Status: None   Collection Time: 05/06/24 12:11 PM  Result Value Ref Range   Folate 6.3 >5.9 ng/mL    Comment: Performed at [facility], [address] [place], [place] [address]  TSH     Status: None   Collection Time: 05/06/24 12:11 PM  Result Value Ref Range   TSH 1.038 0.350 - 4.500 uIU/mL    Comment: Performed by a 3rd Generation assay with a functional sensitivity of <=0.01 uIU/mL. Performed at [facility], [address], [place], [place] [address]   RPR     Status: None   Collection Time: 05/06/24 12:11 PM  Result Value Ref Range   RPR Ser Ql NON REACTIVE NON REACTIVE    Comment: Performed at [facility], [address], [place], [place] [address]  Glucose, capillary     Status: Abnormal   Collection Time: 05/06/24  4:08 PM  Result Value Ref Range   Glucose-Capillary 104 (H) 70 - 99 mg/dL    Comment: Glucose reference range applies only to samples taken after fasting for at least 8 hours.  Glucose, capillary  Status: None    Collection Time: 05/06/24  9:21 PM  Result Value Ref Range   Glucose-Capillary 93 70 - 99 mg/dL    Comment: Glucose reference range applies only to samples taken after fasting for at least 8 hours.  Glucose, capillary     Status: Abnormal   Collection Time: 05/07/24 12:40 AM  Result Value Ref Range   Glucose-Capillary 145 (H) 70 - 99 mg/dL    Comment: Glucose reference range applies only to samples taken after fasting for at least 8 hours.  Renal function panel     Status: Abnormal   Collection Time: 05/07/24  3:01 AM  Result Value Ref Range   Sodium 133 (L) 135 - 145 mmol/L   Potassium 4.5 3.5 - 5.1 mmol/L   Chloride 98 98 - 111 mmol/L   CO2 25 22 - 32 mmol/L   Glucose, Bld 70 70 - 99 mg/dL    Comment: Glucose reference range applies only to samples taken after fasting for at least 8 hours.   BUN 49 (H) 8 - 23 mg/dL   Creatinine, Ser 3.69 (H) 0.61 - 1.24 mg/dL   Calcium  8.3 (L) 8.9 - 10.3 mg/dL   Phosphorus 6.4 (H) 2.5 - 4.6 mg/dL   Albumin  2.7 (L) 3.5 - 5.0 g/dL   GFR, Estimated 9 (L) >60 mL/min    Comment: (NOTE) Calculated using the CKD-EPI Creatinine Equation (2021)    Anion gap 10 5 - 15    Comment: Performed at [facility], [address], [place], [place] [address]  CBC     Status: Abnormal   Collection Time: 05/07/24  3:01 AM  Result Value Ref Range   WBC 14.5 (H) 4.0 - 10.5 K/uL   RBC 3.58 (L) 4.22 - 5.81 MIL/uL   Hemoglobin 10.4 (L) 13.0 - 17.0 g/dL   HCT 66.2 (L) 60.9 - 47.9 %   MCV 94.1 80.0 - 100.0 fL   MCH 29.1 26.0 - 34.0 pg   MCHC 30.9 30.0 - 36.0 g/dL   RDW 85.5 88.4 - 84.4 %   Platelets 281 150 - 400 K/uL   nRBC 0.0 0.0 - 0.2 %    Comment: Performed at [facility], [address] [place], [place] [address]  Glucose, capillary     Status: None   Collection Time: 05/07/24  4:12 AM  Result Value Ref Range   Glucose-Capillary 90 70 - 99 mg/dL    Comment: Glucose reference range applies only to samples taken after fasting for at least 8 hours.  Glucose,  capillary     Status: None   Collection Time: 05/07/24  7:49 AM  Result Value Ref Range   Glucose-Capillary 85 70 - 99 mg/dL    Comment: Glucose reference range applies only to samples taken after fasting for at least 8 hours.  Glucose, capillary     Status: None   Collection Time: 05/07/24 11:40 AM  Result Value Ref Range   Glucose-Capillary 91 70 - 99 mg/dL    Comment: Glucose reference range applies only to samples taken after fasting for at least 8 hours.   Personally reviewed CT 7/2- collection developing in the right perianal region  DG CHEST PORT 1 VIEW Result Date: 05/07/2024 CLINICAL DATA:  Fever. EXAM: PORTABLE CHEST 1 VIEW COMPARISON:  Radiographs 05/04/2024. FINDINGS: 0805 hours. The heart size and mediastinal contours are normal. The lungs are clear. There is no pleural effusion or pneumothorax. No acute osseous findings are identified. Mild degenerative changes in  the spine. Previous lower cervical fusion, partially imaged. IMPRESSION: No evidence of acute cardiopulmonary process. Electronically Signed   By: Elsie Perone M.D.   On: 05/07/2024 12:08   MR ANGIO HEAD WO CONTRAST Result Date: 05/06/2024 CLINICAL DATA:  Stroke follow-up EXAM: MRA HEAD WITHOUT CONTRAST TECHNIQUE: Angiographic images of the Circle of Willis were acquired using MRA technique without intravenous contrast. COMPARISON:  MRI head earlier same day. FINDINGS: Anterior circulation: The intracranial internal carotid arteries are patent. Atherosclerosis noted along the bilateral cavernous ICAs without evidence of high-grade stenosis. The right M1 segment and right MCA bifurcation are patent. Proximal right M2 branches are patent. Distal right MCA branches are patent. The left M1 segment and left MCA bifurcation are patent. Proximal left M2 branches are patent. Distal left MCA branches are patent. The A1 and A2 segments are patent bilaterally. Distal ACA branches are symmetric. Posterior circulation: The visualized  intracranial vertebral arteries are patent. Basilar artery is patent. The bilateral posterior cerebral arteries are patent. Superior cerebellar arteries are patent bilaterally. The PICA patent bilaterally. Anatomic variants: None significant Other: None. IMPRESSION: Patent intracranial arterial vasculature. Atherosclerosis of the bilateral cavernous ICAs without high-grade stenosis. Electronically Signed   By: Donnice Mania M.D.   On: 05/06/2024 17:00   MR BRAIN WO CONTRAST Result Date: 05/06/2024 CLINICAL DATA:  63 year old male with altered mental status. End stage renal disease. Weakness. EXAM: MRI HEAD WITHOUT CONTRAST TECHNIQUE: Multiplanar, multiecho pulse sequences of the brain and surrounding structures were obtained without intravenous contrast. COMPARISON:  None Available. FINDINGS: The examination was discontinued prior to completion by the patient. Diagnostic axial and coronal DWI, axial FLAIR and T2 * imaging only was obtained. Punctate superior posterior right frontal lobe white matter restricted diffusion on series 2, image 42 and series 3, image 42. No discrete FLAIR hyperintensity. Additional subcentimeter focus of restricted diffusion in the left lateral cerebellum on series 2, image 9. Furthermore, difficult to exclude additional punctate restricted diffusion in the medial left parietal lobe subcortical white matter (also on image 42), right midbrain (series 2, image 25). No midline shift, mass effect, evidence of mass lesion, ventriculomegaly, extra-axial collection or acute intracranial hemorrhage. Vague but confluent periventricular white matter T2 hyperintensity. This is moderately advanced. Chronic lacunar infarct in the right pons on series 6, image 17. Additional pontine signal heterogeneity. And T2 * images do suggest chronic microhemorrhage in the left pons, also the left cerebellum. No definite cortical encephalomalacia. Chronic small vessel ischemia and microhemorrhage in the  brainstem. Confluent cerebral white matter signal changes also most commonly due to small vessel disease. IMPRESSION: 1. Truncated exam. 2. Positive for multiple subcentimeter acute lacunar type infarcts, including the posterior right frontal lobe white matter, left lateral cerebellum. Possible additional punctate involvement including in the right midbrain. Negative for evidence of acute intracranial hemorrhage or mass effect. 3. Evidence of underlying moderately advanced chronic small vessel ischemia in the brain, including chronic lacunar infarcts in the right pons, chronic microhemorrhage in the left pons and left cerebellum. Electronically Signed   By: VEAR Hurst M.D.   On: 05/06/2024 11:04   ECHOCARDIOGRAM COMPLETE Result Date: 05/05/2024    ECHOCARDIOGRAM REPORT   Patient Name:   Curtis Hartman Date of Exam: 05/05/2024 Medical Rec #:  [id number]        Height:       69.0 in Accession #:    [id number]       Weight:       138.4 lb Date of  Birth:  Apr 17, 1961        BSA:          1.767 m Patient Age:    62 years         BP:           133/70 mmHg Patient Gender: M                HR:           84 bpm. Exam Location:  Inpatient Procedure: 2D Echo, Color Doppler and Cardiac Doppler (Both Spectral and Color            Flow Doppler were utilized during procedure). Indications:    Syncope  History:        Patient has no prior history of Echocardiogram examinations.  Sonographer:    Tinnie Gosling RDCS Referring Phys: [phone] [place] L [place] IMPRESSIONS  1. Left ventricular ejection fraction, by estimation, is 65 to 70%. The left ventricle has normal function. The left ventricle has no regional wall motion abnormalities. There is moderate concentric left ventricular hypertrophy. Left ventricular diastolic parameters were normal.  2. Right ventricular systolic function is normal. The right ventricular size is normal. Tricuspid regurgitation signal is inadequate for assessing PA pressure.  3. Left atrial size was moderately  dilated.  4. The mitral valve is degenerative. Trivial mitral valve regurgitation.  5. The aortic valve is tricuspid. There is mild calcification of the aortic valve. Aortic valve regurgitation is not visualized.  6. The inferior vena cava is normal in size with greater than 50% respiratory variability, suggesting right atrial pressure of 3 mmHg. Comparison(s): No prior Echocardiogram. FINDINGS  Left Ventricle: Left ventricular ejection fraction, by estimation, is 65 to 70%. The left ventricle has normal function. The left ventricle has no regional wall motion abnormalities. The left ventricular internal cavity size was normal in size. There is  moderate concentric left ventricular hypertrophy. Left ventricular diastolic parameters were normal. Right Ventricle: The right ventricular size is normal. No increase in right ventricular wall thickness. Right ventricular systolic function is normal. Tricuspid regurgitation signal is inadequate for assessing PA pressure. Left Atrium: Left atrial size was moderately dilated. Right Atrium: Right atrial size was normal in size. Pericardium: Trivial pericardial effusion is present. The pericardial effusion is posterior to the left ventricle. Mitral Valve: The mitral valve is degenerative in appearance. There is mild calcification of the mitral valve leaflet(s). Mild mitral annular calcification. Trivial mitral valve regurgitation. Tricuspid Valve: The tricuspid valve is grossly normal. Tricuspid valve regurgitation is mild. Aortic Valve: The aortic valve is tricuspid. There is mild calcification of the aortic valve. There is mild aortic valve annular calcification. Aortic valve regurgitation is not visualized. Pulmonic Valve: The pulmonic valve was grossly normal. Pulmonic valve regurgitation is trivial. Aorta: The aortic root is normal in size and structure. Venous: The inferior vena cava is normal in size with greater than 50% respiratory variability, suggesting right atrial  pressure of 3 mmHg. IAS/Shunts: No atrial level shunt detected by color flow Doppler. Additional Comments: 3D was performed not requiring image post processing on an independent workstation and was indeterminate.  LEFT VENTRICLE PLAX 2D LVIDd:         4.90 cm   Diastology LVIDs:         2.90 cm   LV e' medial:    9.14 cm/s LV PW:         1.50 cm   LV E/e' medial:  9.7 LV IVS:  1.50 cm   LV e' lateral:   8.16 cm/s LVOT diam:     2.50 cm   LV E/e' lateral: 10.9 LV SV:         90 LV SV Index:   51 LVOT Area:     4.91 cm  RIGHT VENTRICLE             IVC RV S prime:     20.00 cm/s  IVC diam: 1.10 cm TAPSE (M-mode): 3.5 cm LEFT ATRIUM           Index        RIGHT ATRIUM           Index LA diam:      3.50 cm 1.98 cm/m   RA Area:     13.70 cm LA Vol (A4C): 88.5 ml 50.08 ml/m  RA Volume:   32.50 ml  18.39 ml/m  AORTIC VALVE LVOT Vmax:   93.80 cm/s LVOT Vmean:  59.800 cm/s LVOT VTI:    0.184 m  AORTA Ao Root diam: 3.00 cm Ao Asc diam:  3.10 cm MITRAL VALVE MV Area (PHT): 4.29 cm    SHUNTS MV Decel Time: 177 msec    Systemic VTI:  0.18 m MV E velocity: 88.80 cm/s  Systemic Diam: 2.50 cm MV A velocity: 77.80 cm/s MV E/A ratio:  1.14 Jayson Sierras MD Electronically signed by Jayson Sierras MD Signature Date/Time: 05/05/2024/3:49:27 PM    Final    CLINICAL DATA:  Abdominal pain.   EXAM: CT ABDOMEN AND PELVIS WITHOUT CONTRAST   TECHNIQUE: Multidetector CT imaging of the abdomen and pelvis was performed following the standard protocol without IV contrast.   RADIATION DOSE REDUCTION: This exam was performed according to the departmental dose-optimization program which includes automated exposure control, adjustment of the mA and/or kV according to patient size and/or use of iterative reconstruction technique.   COMPARISON:  None Available.   FINDINGS: Evaluation of this exam is limited in the absence of intravenous contrast. Evaluation is also limited due to streak artifact caused by patient's arms  and spinal hardware.   Lower chest: The visualized lung bases are clear. There is coronary vascular calcification.   No intra-abdominal free air or free fluid.   Hepatobiliary: The liver is unremarkable. No biliary dilatation. The gallbladder is unremarkable.   Pancreas: Unremarkable. No pancreatic ductal dilatation or surrounding inflammatory changes.   Spleen: Normal in size without focal abnormality.   Adrenals/Urinary Tract: The right adrenal gland is unremarkable. A 2 cm left adrenal adenoma. There is no hydronephrosis or nephrolithiasis on either side. The visualized ureters and urinary bladder appear unremarkable.   Stomach/Bowel: There is no bowel obstruction or active inflammation. The appendix is normal.   Vascular/Lymphatic: Moderate aortoiliac atherosclerotic disease. The IVC is unremarkable. No portal venous gas. There is no adenopathy.   Reproductive: The prostate and seminal vesicles are grossly unremarkable. No pelvic mass.   Other: Partially visualized 2.5 x 5.0 cm indurated area in the subcutaneous soft tissues of the right buttock adjacent to the intergluteal cleft suspicious for a perianal collection or developing abscess. Ultrasound may provide better evaluation.   Musculoskeletal: Osteopenia with degenerative changes of the spine. Multilevel posterior fusion hardware. No acute osseous pathology.   IMPRESSION: 1. No hydronephrosis or nephrolithiasis. 2. No bowel obstruction. Normal appendix. 3. Right perianal collection or developing abscess. Ultrasound may provide better evaluation. 4.  Aortic Atherosclerosis (ICD10-I70.0).     Electronically Signed   By: Vanetta Shelia HERO.D.  On: 05/04/2024 17:44   Procedure: Incision and drainage of perianal abscess Diagnosis: Perianal abscess Description: The patient was placed on his left side. The RN was present and a time out was performed. Dilaudid  0.5 mg was given. Pain ease was given at the perianal  region. Betadine was used to clean the area. I then injected lidocaine  1% at the fluctuant area. A stab incision with a 11 blade was performed. Cloudy, milky blood fluid was evacuated. Culture was obtained. Iodoform 1 inch packing was placed and gauze. The patient tolerated the procedure well.    Assessment & Plan:  Curtis Hartman is a 63 y.o. male with perianal abscess on exam. Discussed I&D at the bedside or in the OR. Discussed risk of bleeding, infection, needing more surgery or fistula formation. Discussed increased risk of bleeding with the Plavix . He wanted to proceed at the bedside to get things to improve sooner. See procedure note above.  He did report a fall and I wonder if that fall caused a hematoma and then this got infected.   -IV antibiotics- zoysn -Culture sent -packing should fall out -Will check on tomorrow  -Reinforce dressing with any bleeding   All questions were answered to the satisfaction of the patient.    Manuelita JAYSON Pander 05/07/2024, 2:25 PM

## 2024-05-07 NOTE — Evaluation (Signed)
 Physical Therapy Evaluation Patient Details Name: ANTWANN PREZIOSI MRN: [id number] DOB: Apr 11, 1961 Today's Date: 05/07/2024  History of Present Illness  Curtis Hartman is a 63 y.o. male with medical history significant for end-stage renal disease on hemodialysis on Monday Wednesday Fridays, COPD, type 2 diabetes mellitus, GERD, osteoarthritis, hypertension, chronic hepatitis C and peripheral neuropathy, who presented to the emergency room with acute onset of generalized weakness while he was in dialysis with unresponsiveness/syncope after which the patient was mildly confused.  No witnessed seizures.  He stated that he took 30 mg of p.o. morphine  sulfate this morning rather than 15 mg as his dose was recently increased.  He denied any fever or chills.  No chest pain or dyspnea or cough or wheezing.  No dysuria, urinary frequency or urgency, hematuria or flank pain.  He was unable to stand in the ER due to tremors in all 4 extremities.  He had diarrhea couple days ago which has resolved.  He admits to lower abdominal suprapubic pain.  He continues to smoke 5 cigarettes/day.  No paresthesias or focal muscle weakness.  No headache or dizziness or blurred vision.  No tinnitus or vertigo.  No bleeding diathesis.   Clinical Impression  Patient has to use his hands for moving RLE during supine to sitting, had difficulty completing sit to stands from bedside due requiring verbal cueing for proper hand placement with fair/good carryover, has to hike left hip during ambulation due to baseline left foot drop, but has difficulty advancing RLE which is new per patient and limited mostly due to fatigue. Patient tolerated sitting up in chair after therapy. Patient will benefit from continued skilled physical therapy in hospital and recommended venue below to increase strength, balance, endurance for safe ADLs and gait.       If plan is discharge home, recommend the following: Help with stairs or ramp for entrance;A  lot of help with walking and/or transfers;A lot of help with bathing/dressing/bathroom;Assistance with cooking/housework   Can travel by private vehicle        Equipment Recommendations None recommended by PT  Recommendations for Other Services       Functional Status Assessment Patient has had a recent decline in their functional status and demonstrates the ability to make significant improvements in function in a reasonable and predictable amount of time.     Precautions / Restrictions Precautions Precautions: Fall Restrictions Weight Bearing Restrictions Per Provider Order: No      Mobility  Bed Mobility Overal bed mobility: Needs Assistance Bed Mobility: Supine to Sit     Supine to sit: Supervision, Contact guard     General bed mobility comments: labored movement    Transfers Overall transfer level: Needs assistance Equipment used: Rolling walker (2 wheels) Transfers: Sit to/from Stand, Bed to chair/wheelchair/BSC Sit to Stand: Min assist   Step pivot transfers: Min assist       General transfer comment: verbal, tactile cueing for proper hand placement on RW during sit to stands with fair/good carryover, labored movement    Ambulation/Gait Ambulation/Gait assistance: Min assist, Mod assist Gait Distance (Feet): 40 Feet Assistive device: Rolling walker (2 wheels) Gait Pattern/deviations: Decreased step length - right, Decreased step length - left, Decreased stride length, Decreased dorsiflexion - left, Steppage Gait velocity: decreased     General Gait Details: slow labored movement having to hike left hip due to baseline foot drop, limited mostly due to fatigue  Stairs  Wheelchair Mobility     Tilt Bed    Modified Rankin (Stroke Patients Only)       Balance Overall balance assessment: Needs assistance Sitting-balance support: Feet supported, No upper extremity supported Sitting balance-Leahy Scale: Fair Sitting balance -  Comments: fair/good seated at EOB   Standing balance support: During functional activity, Bilateral upper extremity supported, Reliant on assistive device for balance Standing balance-Leahy Scale: Poor Standing balance comment: fair/poor using RW                             Pertinent Vitals/Pain Pain Assessment Pain Assessment: Faces Faces Pain Scale: Hurts little more Pain Location: base line left shoulder with end range movements Pain Descriptors / Indicators: Sore, Grimacing, Guarding, Discomfort Pain Intervention(s): Limited activity within patient's tolerance, Monitored during session, Repositioned    Home Living Family/patient expects to be discharged to:: Private residence Living Arrangements: Other relatives Available Help at Discharge: Family;Available 24 hours/day Type of Home: House Home Access: Stairs to enter   [employer] of Steps: 2   Home Layout: One level Home Equipment: Rollator (4 wheels);BSC/3in1;Adaptive equipment;Shower seat;Tub bench      Prior Function Prior Level of Function : Independent/Modified Independent             Mobility Comments: uses rollator outside, and generally no AD in the home as he furniture walks, leans on grocery carts or uses powe scooters in stores ADLs Comments: Independent for household, assisted for some community ADLs, does not drive     Extremity/Trunk Assessment   Upper Extremity Assessment Upper Extremity Assessment: Defer to OT evaluation    Lower Extremity Assessment Lower Extremity Assessment: Generalized weakness;RLE deficits/detail;LLE deficits/detail RLE Deficits / Details: grossly -4/5 RLE Sensation: WNL RLE Coordination: decreased gross motor LLE Deficits / Details: grossly 4+/5 except ankle dorsiflexion 0/5 (baseline) LLE Sensation: WNL LLE Coordination: decreased fine motor    Cervical / Trunk Assessment Cervical / Trunk Assessment: Normal  Communication    Communication Communication: No apparent difficulties    Cognition Arousal: Alert Behavior During Therapy: WFL for tasks assessed/performed   PT - Cognitive impairments: No apparent impairments                         Following commands: Intact       Cueing       General Comments      Exercises     Assessment/Plan    PT Assessment Patient needs continued PT services  PT Problem List Decreased strength;Decreased activity tolerance;Decreased balance;Decreased mobility;Pain       PT Treatment Interventions DME instruction;Gait training;Stair training;Functional mobility training;Therapeutic activities;Therapeutic exercise;Balance training;Patient/family education    PT Goals (Current goals can be found in the Care Plan section)  Acute Rehab PT Goals Patient Stated Goal: return home with family to assist PT Goal Formulation: With patient Time For Goal Achievement: 05/21/24 Potential to Achieve Goals: Good    Frequency Min 4X/week     Co-evaluation               AM-PAC PT 6 Clicks Mobility  Outcome Measure Help needed turning from your back to your side while in a flat bed without using bedrails?: A Little Help needed moving from lying on your back to sitting on the side of a flat bed without using bedrails?: A Little Help needed moving to and from a bed to a chair (including a wheelchair)?: A Little  Help needed standing up from a chair using your arms (e.g., wheelchair or bedside chair)?: A Little Help needed to walk in hospital room?: A Lot Help needed climbing 3-5 steps with a railing? : A Lot 6 Click Score: 16    End of Session   Activity Tolerance: Patient tolerated treatment well;Patient limited by fatigue Patient left: in chair;with call bell/phone within reach;with chair alarm set Nurse Communication: Mobility status PT Visit Diagnosis: Unsteadiness on feet (R26.81);Other abnormalities of gait and mobility (R26.89);Muscle weakness  (generalized) (M62.81)    Time: 8942-8871 PT Time Calculation (min) (ACUTE ONLY): 31 min   Charges:   PT Evaluation $PT Eval Moderate Complexity: 1 Mod PT Treatments $Therapeutic Activity: 23-37 mins PT General Charges $$ ACUTE PT VISIT: 1 Visit         2:45 PM, 05/07/24 Lynwood Music, MPT Physical Therapist with [facility] [phone] office [phone] mobile phone

## 2024-05-08 DIAGNOSIS — J449 Chronic obstructive pulmonary disease, unspecified: Secondary | ICD-10-CM | POA: Diagnosis not present

## 2024-05-08 DIAGNOSIS — G9341 Metabolic encephalopathy: Secondary | ICD-10-CM | POA: Diagnosis not present

## 2024-05-08 DIAGNOSIS — N186 End stage renal disease: Secondary | ICD-10-CM | POA: Diagnosis not present

## 2024-05-08 DIAGNOSIS — E1142 Type 2 diabetes mellitus with diabetic polyneuropathy: Secondary | ICD-10-CM | POA: Diagnosis not present

## 2024-05-08 LAB — RENAL FUNCTION PANEL
Albumin: 2.6 g/dL — ABNORMAL LOW (ref 3.5–5.0)
Anion gap: 14 (ref 5–15)
BUN: 36 mg/dL — ABNORMAL HIGH (ref 8–23)
CO2: 27 mmol/L (ref 22–32)
Calcium: 8.6 mg/dL — ABNORMAL LOW (ref 8.9–10.3)
Chloride: 93 mmol/L — ABNORMAL LOW (ref 98–111)
Creatinine, Ser: 4.53 mg/dL — ABNORMAL HIGH (ref 0.61–1.24)
GFR, Estimated: 14 mL/min — ABNORMAL LOW (ref >60)
Glucose, Bld: 100 mg/dL — ABNORMAL HIGH (ref 70–99)
Phosphorus: 5 mg/dL — ABNORMAL HIGH (ref 2.5–4.6)
Potassium: 4.3 mmol/L (ref 3.5–5.1)
Sodium: 134 mmol/L — ABNORMAL LOW (ref 135–145)

## 2024-05-08 LAB — CBC WITH DIFFERENTIAL/PLATELET
Abs Immature Granulocytes: 0.05 K/uL (ref 0.00–0.07)
Basophils Absolute: 0.1 K/uL (ref 0.0–0.1)
Basophils Relative: 1 %
Eosinophils Absolute: 0.2 K/uL (ref 0.0–0.5)
Eosinophils Relative: 2 %
HCT: 34.9 % — ABNORMAL LOW (ref 39.0–52.0)
Hemoglobin: 10.9 g/dL — ABNORMAL LOW (ref 13.0–17.0)
Immature Granulocytes: 1 %
Lymphocytes Relative: 13 %
Lymphs Abs: 1.3 K/uL (ref 0.7–4.0)
MCH: 28.9 pg (ref 26.0–34.0)
MCHC: 31.2 g/dL (ref 30.0–36.0)
MCV: 92.6 fL (ref 80.0–100.0)
Monocytes Absolute: 0.9 K/uL (ref 0.1–1.0)
Monocytes Relative: 9 %
Neutro Abs: 7.5 K/uL (ref 1.7–7.7)
Neutrophils Relative %: 74 %
Platelets: 318 K/uL (ref 150–400)
RBC: 3.77 MIL/uL — ABNORMAL LOW (ref 4.22–5.81)
RDW: 14.2 % (ref 11.5–15.5)
WBC: 10 K/uL (ref 4.0–10.5)
nRBC: 0 % (ref 0.0–0.2)

## 2024-05-08 LAB — GLUCOSE, CAPILLARY
Glucose-Capillary: 107 mg/dL — ABNORMAL HIGH (ref 70–99)
Glucose-Capillary: 134 mg/dL — ABNORMAL HIGH (ref 70–99)
Glucose-Capillary: 138 mg/dL — ABNORMAL HIGH (ref 70–99)
Glucose-Capillary: 144 mg/dL — ABNORMAL HIGH (ref 70–99)
Glucose-Capillary: 212 mg/dL — ABNORMAL HIGH (ref 70–99)
Glucose-Capillary: 81 mg/dL (ref 70–99)
Glucose-Capillary: 99 mg/dL (ref 70–99)

## 2024-05-08 MED ORDER — SACCHAROMYCES BOULARDII 250 MG PO CAPS
250.0000 mg | ORAL_CAPSULE | Freq: Two times a day (BID) | ORAL | Status: DC
  Administered 2024-05-08 – 2024-05-11 (×7): 250 mg via ORAL
  Filled 2024-05-08 (×7): qty 1

## 2024-05-08 NOTE — Evaluation (Signed)
 Speech Language Pathology Evaluation Patient Details Name: Curtis Hartman MRN: [id number] DOB: Sep 12, 1961 Today's Date: 05/08/2024 Time: 8953-8942 SLP Time Calculation (min) (ACUTE ONLY): 11 min  Problem List:  Patient Active Problem List   Diagnosis Date Noted   Perianal abscess 05/07/2024   Acute CVA (cerebrovascular accident) (HCC) 05/06/2024   Essential hypertension 05/05/2024   Type 2 diabetes mellitus with peripheral neuropathy (HCC) 05/05/2024   Chronic obstructive pulmonary disease (COPD) (HCC) 05/05/2024   End-stage renal disease on hemodialysis (HCC) 05/05/2024   Acute metabolic encephalopathy 05/04/2024   Coping style affecting medical condition 09/17/2023   Lumbar stenosis 09/11/2023   Lumbar spinal stenosis 09/01/2023   COPD GOLD ? 06/10/2023   Cigarette smoker 06/10/2023   S/P cervical spinal fusion 11/05/2022   Chronic hepatitis C (HCC) 06/25/2022   IBS (irritable bowel syndrome) 06/25/2022   GERD (gastroesophageal reflux disease) 06/25/2022   Impaired mobility and ADLs 11/05/2021   CKD (chronic kidney disease) stage 3, GFR 30-59 ml/min (HCC) 10/29/2021   Hypertension 10/29/2021   T2DM (type 2 diabetes mellitus) (HCC) 10/29/2021   Cervical myelopathy with cervical radiculopathy (HCC) 09/23/2021   Past Medical History:  Past Medical History:  Diagnosis Date   Arthritis    CKD (chronic kidney disease)    COPD (chronic obstructive pulmonary disease) (HCC)    Diabetes mellitus without complication (HCC)    type 2   GERD (gastroesophageal reflux disease)    Headache    Hx Migraines   Hepatitis    Hypertension    Neuropathy    Past Surgical History:  Past Surgical History:  Procedure Laterality Date   ANTERIOR CERVICAL DECOMP/DISCECTOMY FUSION N/A 11/05/2022   Procedure: Anterior Cervical Decompression/discectomy Fusion Cervical Four-Cervical Five;  Surgeon: Debby Dorn MATSU, MD;  Location: [facility] OR;  Service: Neurosurgery;  Laterality: N/A;  3C   ANTERIOR  LAT LUMBAR FUSION N/A 09/01/2023   Procedure: DIRECT LUMBAR INTERBODY FUSION, LUMBAR TWO-LUMBAR THREE, LUMBAR THREE-LUMBAR FOUR, LUMBAR FOUR-LUMBAR FIVE, RIGHT PRONE TRANSPSOAS, LUMBAR TWO LUMBAR FIVE POSTERIOR PERCUTANEOUS FUSION LUMBAR FOUR-LUMBAR FIVE MINIMALLY INVASIVE LAMINECTOMY, FORAMINOTOMY;  Surgeon: Debby Dorn MATSU, MD;  Location: [facility] OR;  Service: Neurosurgery;  Laterality: N/A;   AV FISTULA PLACEMENT Left 09/10/2023   Procedure: LEFT ARM ARTERIOVENOUS (AV) FISTULA CREATION;  Surgeon: Pearline Norman RAMAN, MD;  Location: [facility] OR;  Service: Vascular;  Laterality: Left;   BACK SURGERY  2023   Cervical Spine   BIOPSY  03/03/2022   Procedure: BIOPSY;  Surgeon: Cindie Carlin POUR, DO;  Location: [facility] SUITE;  Service: Endoscopy;;   COLONOSCOPY WITH PROPOFOL  N/A 03/03/2022   Procedure: COLONOSCOPY WITH PROPOFOL ;  Surgeon: Cindie Carlin POUR, DO;  Location: [facility];  Service: Endoscopy;  Laterality: N/A;  8:00am   ESOPHAGOGASTRODUODENOSCOPY (EGD) WITH PROPOFOL  N/A 03/03/2022   Procedure: ESOPHAGOGASTRODUODENOSCOPY (EGD) WITH PROPOFOL ;  Surgeon: Cindie Carlin POUR, DO;  Location: [facility];  Service: Endoscopy;  Laterality: N/A;   foot right partial toe amputation Right    FOOT SURGERY Right    x3   hernia surgery x4     LUMBAR PERCUTANEOUS PEDICLE SCREW 3 LEVEL N/A 09/01/2023   Procedure: L2-5 LUMBAR PERCUTANEOUS PEDICLE SCREW 3 LEVEL;  Surgeon: Debby Dorn MATSU, MD;  Location: [facility] OR;  Service: Neurosurgery;  Laterality: N/A;   POLYPECTOMY  03/03/2022   Procedure: POLYPECTOMY;  Surgeon: Cindie Carlin POUR, DO;  Location: [facility];  Service: Endoscopy;;   HPI:  Curtis Hartman is a 63 y.o. male with medical history significant  for end-stage renal disease on hemodialysis on Monday Wednesday Fridays, COPD, type 2 diabetes mellitus, GERD, osteoarthritis, hypertension, chronic hepatitis C and peripheral neuropathy, who presented to the emergency room on 05/04/2024 with acute onset of  generalized weakness while he was in dialysis with unresponsiveness/syncope after which the patient was mildly confused.  No witnessed seizures.  He stated that he took 30 mg of p.o. morphine  sulfate this morning rather than 15 mg as his dose was recently increased.   MRI on 05/06/2024 revealed IMPRESSION:  1. Truncated exam.     2. Positive for multiple subcentimeter acute lacunar type infarcts,  including the posterior right frontal lobe white matter, left  lateral cerebellum. Possible additional punctate involvement  including in the right midbrain.  Negative for evidence of acute intracranial hemorrhage or mass  effect.     3. Evidence of underlying moderately advanced chronic small vessel  ischemia in the brain, including chronic lacunar infarcts in the  right pons, chronic microhemorrhage in the left pons and left  cerebellum.   Assessment / Plan / Recommendation Clinical Impression  When this writer entered pt's room, he was sitting on bedside commode eating ice cream. He requested that this writer stay. Informal cognitive communication evaluation provided. Pt reports subjective improvement in cognitive function - he was able to demonstrate orientation x 4, follow basic directions, demonstrate selective attention and basic problem solving abilities. Chart review also indicated that his cognitive abilities were functional with interdisciplinary team. At this time, pt's abilities appear grossly functional. Skilled ST services are not indicated in this setting.    SLP Assessment  SLP Recommendation/Assessment: Patient does not need any further Speech Language Pathology Services SLP Visit Diagnosis: Cognitive communication deficit (R41.841)        Functional Status Assessment Patient has not had a recent decline in their functional status        SLP Evaluation Cognition  Overall Cognitive Status: Within Functional Limits for tasks assessed Arousal/Alertness: Awake/alert Orientation Level:  Oriented X4       Comprehension  Auditory Comprehension Overall Auditory Comprehension: Appears within functional limits for tasks assessed Visual Recognition/Discrimination Discrimination: Not tested Reading Comprehension Reading Status: Not tested    Expression Expression Primary Mode of Expression: Verbal Verbal Expression Overall Verbal Expression: Appears within functional limits for tasks assessed Written Expression Dominant Hand: Right Written Expression: Not tested   Oral / Motor  Oral Motor/Sensory Function Overall Oral Motor/Sensory Function: Within functional limits Motor Speech Overall Motor Speech: Appears within functional limits for tasks assessed           Ashana Tullo B. Rubbie, M.S., CCC-SLP, Tree surgeon Certified Brain Injury Specialist [facility]  [facility] Rehabilitation Services Office [phone] Ascom [phone] Fax [phone]

## 2024-05-08 NOTE — Plan of Care (Signed)

## 2024-05-08 NOTE — Progress Notes (Signed)
 [facility]  No issues. Says feels better. WBC down. Had bms.   Culture with WBC but no growth to date.  BP 137/62   Pulse 63   Temp 98.1 F (36.7 C) (Oral)   Resp 18   Ht 5' 5 (1.651 m)   Wt 62 kg   SpO2 100%   BMI 22.75 kg/m  Soft, perianal region, less tender, packing partially out, removed, no bleeding  Patient s/p I&D of perianal abscess. Doing well.  Zosyn  can change to augmentin  at dc Sitz baths today and at home    Manuelita Pander, MD [facility] [address] [place] [address] [place], [place] [address] [phone] (office)

## 2024-05-08 NOTE — Progress Notes (Signed)
 PROGRESS NOTE   Curtis TROTTA  FMW:[id number] DOB: 07-27-1961 DOA: 05/04/2024 PCP: Gerome Tillman LITTIE, FNP   Chief Complaint  Patient presents with   Loss of Consciousness   Level of care: Telemetry  Brief Admission History:   63 y.o. male with medical history significant for end-stage renal disease on hemodialysis on Monday Wednesday Fridays, COPD, type 2 diabetes mellitus, GERD, osteoarthritis, hypertension, chronic hepatitis C and peripheral neuropathy, who presented to the emergency room with acute onset of generalized weakness while he was in dialysis with unresponsiveness/syncope after which the patient was mildly confused.  No witnessed seizures.  He stated that he took 30 mg of p.o. morphine  sulfate this morning rather than 15 mg as his dose was recently increased.  He denied any fever or chills.  No chest pain or dyspnea or cough or wheezing.  No dysuria, urinary frequency or urgency, hematuria or flank pain.  He was unable to stand in the ER due to tremors in all 4 extremities.  He had diarrhea couple days ago which has resolved.  He admits to lower abdominal suprapubic pain.  He continues to smoke 5 cigarettes/day.  No paresthesias or focal muscle weakness.  No headache or dizziness or blurred vision.  No tinnitus or vertigo.  No bleeding diathesis.   ED Course: When he came to the ER, temperature was 99.5 and otherwise vital signs were within normal.  Labs revealed a BUN of 52 and creatinine 6.24, albumin  of 3.2 and CBC showed leukocytosis of 11.2 with neutrophilia and mild anemia.  GI panel was sent by the ED physician.   Imaging: Abdominal pelvic CT scan without contrast: 1. No hydronephrosis or nephrolithiasis. 2. No bowel obstruction. Normal appendix. 3. Right perianal collection or developing abscess. Ultrasound may provide better evaluation. 4.  Aortic Atherosclerosis.   Assessment and Plan:  Acute metabolic encephalopathy--IMPROVING - associated with  unresponsiveness/syncope - suspect related to adverse reaction to morphine  - DC morphine  -- agree with nephrologist; avoid morphine , can use hydromorphine, fentanyl , methadone for chronic pain - we followed neurochecks  -- he is back to baseline; had dialysis treatment on 7/3, ordered MRI brain on 7/4  -- DC gabapentin  due to ongoing confusion   Acute CVA  -- appreciate neurologist consultation and recommendations  -- pt starting aspirin  and plavix  therapy for secondary prevention  -- MRA ordered and no findings of severe intracranial stenosis, therefore recommendation is for 3 weeks of DAPT, followed by aspirin  81 mg daily  -- stroke work  -- follow up lipid panel -- LDL is optimal at 67 therefore statin was NOT started  -- bedside swallow test was reassuring and placed on dysphagia diet for now -- smoking cessation counseling at bedside -- request sent to [facility] office to provide him a 30 day cardiac monitor per neuro recs  End-stage renal disease on hemodialysis - He missed his hemodialysis session on 7/2, received inpatient HD on 7/3 - Nephrology consult appreciated  Chronic obstructive pulmonary disease (COPD)  - Will continue his inhalers.  Type 2 diabetes mellitus with peripheral neuropathy - The patient will be placed on supplemental coverage with NovoLog . - Will continue basal coverage  -- added SSI coverage and CBG check every 4 hours while NPO  CBG (last 3)  Recent Labs    05/08/24 0410 05/08/24 0746 05/08/24 1141  GLUCAP 107* 99 134*   Essential hypertension Will continue antihypertensive therapy Ok for normotension per neuro  Question of developing anal abscess -- examined  by Dr. Francesca in ED with a chaperone and he has some small fluctuance in this area but no tenderness, erythema or warmth to suggest acute infection  -- given his AMS and increasing WBC and fever, I asked for surgery consult, pt had I&D at bedside and now on zosyn    DVT  prophylaxis: enoxaparin   Code Status: Full  Family Communication: I called his sister and updated  Disposition: home    Consultants:  Nephrology  Procedures:  HD Antimicrobials:    Subjective: Pt remains confused, not back to baseline.   Objective: Vitals:   05/07/24 2051 05/07/24 2051 05/08/24 0408 05/08/24 0856  BP: 102/63 102/63 (!) 157/79 137/62  Pulse: 74 74 65 63  Resp: 20 20 18    Temp: 99.2 F (37.3 C) 99.2 F (37.3 C) 98.1 F (36.7 C)   TempSrc: Oral Oral Oral   SpO2:  96% 100%   Weight: 62 kg     Height: 5' 5 (1.651 m)       Intake/Output Summary (Last 24 hours) at 05/08/2024 1430 Last data filed at 05/08/2024 0907 Gross per 24 hour  Intake 580 ml  Output 1500 ml  Net -920 ml   Filed Weights   05/07/24 1619 05/07/24 1949 05/07/24 2051  Weight: 63.8 kg 62 kg 62 kg   Examination:  General exam: Appears calm and comfortable  Respiratory system: Clear to auscultation. Respiratory effort normal. Cardiovascular system: normal S1 & S2 heard. No JVD, murmurs, rubs, gallops or clicks. No pedal edema. Gastrointestinal system: Abdomen is nondistended, soft and nontender. No organomegaly or masses felt. Normal bowel sounds heard. Central nervous system: Alert and oriented to person, place. No focal neurological deficits. Extremities: Symmetric 5 x 5 power. Skin: No rashes, lesions or ulcers. Psychiatry: Judgement and insight appear diminished. Mood & affect appropriate.   Data Reviewed: I have personally reviewed following labs and imaging studies  CBC: Recent Labs  Lab 05/04/24 1606 05/05/24 0433 05/07/24 0301 05/08/24 0330  WBC 11.2* 10.6* 14.5* 10.0  NEUTROABS 9.4*  --   --  7.5  HGB 12.1* 11.3* 10.4* 10.9*  HCT 37.6* 35.1* 33.7* 34.9*  MCV 93.8 94.1 94.1 92.6  PLT 264 286 281 318    Basic Metabolic Panel: Recent Labs  Lab 05/04/24 1606 05/05/24 0433 05/06/24 1137 05/07/24 0301 05/08/24 0330  NA 143 143 134* 133* 134*  K 4.4 5.0 4.6 4.5 4.3   CL 107 108 97* 98 93*  CO2 23 23 27 25 27   GLUCOSE 127* 101* 95 70 100*  BUN 52* 55* 36* 49* 36*  CREATININE 6.24* 7.00* 5.16* 6.30* 4.53*  CALCIUM  8.6* 8.3* 8.4* 8.3* 8.6*  PHOS  --   --  5.5* 6.4* 5.0*    CBG: Recent Labs  Lab 05/07/24 2056 05/08/24 0050 05/08/24 0410 05/08/24 0746 05/08/24 1141  GLUCAP 229* 138* 107* 99 134*    Recent Results (from the past 240 hours)  Gastrointestinal Panel by PCR , Stool     Status: None   Collection Time: 05/04/24  4:39 PM   Specimen: STOOL  Result Value Ref Range Status   Campylobacter species NOT DETECTED NOT DETECTED Final   Plesimonas shigelloides NOT DETECTED NOT DETECTED Final   Salmonella species NOT DETECTED NOT DETECTED Final   Yersinia enterocolitica NOT DETECTED NOT DETECTED Final   Vibrio species NOT DETECTED NOT DETECTED Final   Vibrio cholerae NOT DETECTED NOT DETECTED Final   Enteroaggregative E coli (EAEC) NOT DETECTED NOT DETECTED Final  Enteropathogenic E coli (EPEC) NOT DETECTED NOT DETECTED Final   Enterotoxigenic E coli (ETEC) NOT DETECTED NOT DETECTED Final   Shiga like toxin producing E coli (STEC) NOT DETECTED NOT DETECTED Final   Shigella/Enteroinvasive E coli (EIEC) NOT DETECTED NOT DETECTED Final   Cryptosporidium NOT DETECTED NOT DETECTED Final   Cyclospora cayetanensis NOT DETECTED NOT DETECTED Final   Entamoeba histolytica NOT DETECTED NOT DETECTED Final   Giardia lamblia NOT DETECTED NOT DETECTED Final   Adenovirus F40/41 NOT DETECTED NOT DETECTED Final   Astrovirus NOT DETECTED NOT DETECTED Final   Norovirus GI/GII NOT DETECTED NOT DETECTED Final   Rotavirus A NOT DETECTED NOT DETECTED Final   Sapovirus (I, II, IV, and V) NOT DETECTED NOT DETECTED Final    Comment: Performed at [facility], [address] [place], [place] [address]  Aerobic/Anaerobic Culture w Gram Stain (surgical/deep wound)     Status: None (Preliminary result)   Collection Time: 05/07/24 10:48 AM   Specimen:  Perirectal; Abscess  Result Value Ref Range Status   Specimen Description   Final    PERIRECTAL ABSCESS Performed at [facility], [address], [place], [place] [address]    Special Requests   Final    PERIANUS Performed at [facility], [address] [place], [place] [address]    Gram Stain   Final    RARE WBC PRESENT, PREDOMINANTLY PMN NO ORGANISMS SEEN    Culture   Final    TOO YOUNG TO READ Performed at [facility], [address], [place], [place] [address]    Report Status PENDING  Incomplete     Radiology Studies: DG CHEST PORT 1 VIEW Result Date: 05/07/2024 CLINICAL DATA:  Fever. EXAM: PORTABLE CHEST 1 VIEW COMPARISON:  Radiographs 05/04/2024. FINDINGS: 0805 hours. The heart size and mediastinal contours are normal. The lungs are clear. There is no pleural effusion or pneumothorax. No acute osseous findings are identified. Mild degenerative changes in the spine. Previous lower cervical fusion, partially imaged. IMPRESSION: No evidence of acute cardiopulmonary process. Electronically Signed   By: Elsie Perone M.D.   On: 05/07/2024 12:08   MR ANGIO HEAD WO CONTRAST Result Date: 05/06/2024 CLINICAL DATA:  Stroke follow-up EXAM: MRA HEAD WITHOUT CONTRAST TECHNIQUE: Angiographic images of the Circle of Willis were acquired using MRA technique without intravenous contrast. COMPARISON:  MRI head earlier same day. FINDINGS: Anterior circulation: The intracranial internal carotid arteries are patent. Atherosclerosis noted along the bilateral cavernous ICAs without evidence of high-grade stenosis. The right M1 segment and right MCA bifurcation are patent. Proximal right M2 branches are patent. Distal right MCA branches are patent. The left M1 segment and left MCA bifurcation are patent. Proximal left M2 branches are patent. Distal left MCA branches are patent. The A1 and A2 segments are patent bilaterally. Distal ACA branches are symmetric. Posterior circulation: The  visualized intracranial vertebral arteries are patent. Basilar artery is patent. The bilateral posterior cerebral arteries are patent. Superior cerebellar arteries are patent bilaterally. The PICA patent bilaterally. Anatomic variants: None significant Other: None. IMPRESSION: Patent intracranial arterial vasculature. Atherosclerosis of the bilateral cavernous ICAs without high-grade stenosis. Electronically Signed   By: Donnice Mania M.D.   On: 05/06/2024 17:00   Scheduled Meds:  aspirin   81 mg Oral Daily   carvedilol   3.125 mg Oral BID WC   Chlorhexidine  Gluconate Cloth  6 each Topical Q0600   clopidogrel   75 mg Oral Daily   enoxaparin  (LOVENOX ) injection  30 mg Subcutaneous Q24H   fluticasone   2 spray Each Nare Daily   insulin  aspart  0-6 Units Subcutaneous TID WC   insulin  glargine-yfgn  5 Units Subcutaneous QHS   oxybutynin   10 mg Oral Daily   saccharomyces boulardii  250 mg Oral BID   Continuous Infusions:  piperacillin -tazobactam (ZOSYN )  IV 3.375 g (05/08/24 1056)     LOS: 2 days   Time spent: 50 mins  Toyoko Silos Vicci, MD How to contact the [facility] Attending or Consulting provider 7A - 7P or covering provider during after hours 7P -7A, for this patient?  Check the care team in [facility] and look for a) attending/consulting TR[facility] provider listed and b) the TRH team listed Log into [email or web address] to find provider on call.  Locate the TRH provider you are looking for under [facility] and page to a number that you can be directly reached. If you still have difficulty reaching the provider, please page the [facility] (Director on Call) for the Hospitalists listed on amion for assistance.  05/08/2024, 2:30 PM

## 2024-05-08 NOTE — Evaluation (Signed)
 Clinical/Bedside Swallow Evaluation Patient Details  Name: Curtis Hartman MRN: [id number] Date of Birth: 13-Aug-1961  Today's Date: 05/08/2024 Time: SLP Start Time (ACUTE ONLY): 1036 SLP Stop Time (ACUTE ONLY): 1046 SLP Time Calculation (min) (ACUTE ONLY): 10 min  Past Medical History:  Past Medical History:  Diagnosis Date   Arthritis    CKD (chronic kidney disease)    COPD (chronic obstructive pulmonary disease) (HCC)    Diabetes mellitus without complication (HCC)    type 2   GERD (gastroesophageal reflux disease)    Headache    Hx Migraines   Hepatitis    Hypertension    Neuropathy    Past Surgical History:  Past Surgical History:  Procedure Laterality Date   ANTERIOR CERVICAL DECOMP/DISCECTOMY FUSION N/A 11/05/2022   Procedure: Anterior Cervical Decompression/discectomy Fusion Cervical Four-Cervical Five;  Surgeon: Debby Dorn MATSU, MD;  Location: [facility] OR;  Service: Neurosurgery;  Laterality: N/A;  3C   ANTERIOR LAT LUMBAR FUSION N/A 09/01/2023   Procedure: DIRECT LUMBAR INTERBODY FUSION, LUMBAR TWO-LUMBAR THREE, LUMBAR THREE-LUMBAR FOUR, LUMBAR FOUR-LUMBAR FIVE, RIGHT PRONE TRANSPSOAS, LUMBAR TWO LUMBAR FIVE POSTERIOR PERCUTANEOUS FUSION LUMBAR FOUR-LUMBAR FIVE MINIMALLY INVASIVE LAMINECTOMY, FORAMINOTOMY;  Surgeon: Debby Dorn MATSU, MD;  Location: [facility] OR;  Service: Neurosurgery;  Laterality: N/A;   AV FISTULA PLACEMENT Left 09/10/2023   Procedure: LEFT ARM ARTERIOVENOUS (AV) FISTULA CREATION;  Surgeon: Pearline Norman RAMAN, MD;  Location: [facility] OR;  Service: Vascular;  Laterality: Left;   BACK SURGERY  2023   Cervical Spine   BIOPSY  03/03/2022   Procedure: BIOPSY;  Surgeon: Cindie Carlin POUR, DO;  Location: [facility] ENDO SUITE;  Service: Endoscopy;;   COLONOSCOPY WITH PROPOFOL  N/A 03/03/2022   Procedure: COLONOSCOPY WITH PROPOFOL ;  Surgeon: Cindie Carlin POUR, DO;  Location: [facility] END[facility]ITE;  Service: Endoscopy;  Laterality: N/A;  8:00am   ESOPHAGOGASTRODUODENOSCOPY (EGD) WITH PROPOFOL   N/A 03/03/2022   Procedure: ESOPHAGOGASTRODUODENOSCOPY (EGD) WITH PROPOFOL ;  Surgeon: Cindie Carlin POUR, DO;  Location: [facility] END[facility] SUITE;  Service: Endoscopy;  Laterality: N/A;   foot right partial toe amputation Right    FOOT SURGERY Right    x3   hernia surgery x4     LUMBAR PERCUTANEOUS PEDICLE SCREW 3 LEVEL N/A 09/01/2023   Procedure: L2-5 LUMBAR PERCUTANEOUS PEDICLE SCREW 3 LEVEL;  Surgeon: Debby Dorn MATSU, MD;  Location: [facility] OR;  Service: Neurosurgery;  Laterality: N/A;   POLYPECTOMY  03/03/2022   Procedure: POLYPECTOMY;  Surgeon: Cindie Carlin POUR, DO;  Location: [facility];  Service: Endoscopy;;   HPI:  Curtis Hartman is a 63 y.o. male with medical history significant for end-stage renal disease on hemodialysis on Monday Wednesday Fridays, COPD, type 2 diabetes mellitus, GERD, osteoarthritis, hypertension, chronic hepatitis C and peripheral neuropathy, who presented to the emergency room on 05/04/2024 with acute onset of generalized weakness while he was in dialysis with unresponsiveness/syncope after which the patient was mildly confused.  No witnessed seizures.  He stated that he took 30 mg of p.o. morphine  sulfate this morning rather than 15 mg as his dose was recently increased.   MRI on 05/06/2024 revealed IMPRESSION:  1. Truncated exam.     2. Positive for multiple subcentimeter acute lacunar type infarcts,  including the posterior right frontal lobe white matter, left  lateral cerebellum. Possible additional punctate involvement  including in the right midbrain.  Negative for evidence of acute intracranial hemorrhage or mass  effect.     3. Evidence of underlying moderately advanced chronic small vessel  ischemia in the brain, including chronic lacunar infarcts in the  right pons, chronic microhemorrhage in the left pons and left  cerebellum.    Assessment / Plan / Recommendation  Clinical Impression  When this writer entered pt's room, he was sitting on bedside commode eating ice  cream. He requested that this writer stay and expressed desire to consume the graham crackers that I had in my hand. Pt was observed eating the strawberry ice cream without any observed s/s of aspiration as well as the graham crackers and he passed stroke swallow screen. Pt doesn't have any history of dysphagia. At this time, recommend diet upgrade to regular with thin liquids, medicine whole with thin liquids. He risks of aspiration appears reduced when following general aspiration precautions.  SLP Visit Diagnosis: Dysphagia, unspecified (R13.10)    Aspiration Risk  No limitations    Diet Recommendation Regular;Thin liquid    Liquid Administration via: Cup;Straw Medication Administration: Whole meds with liquid Supervision: Patient able to self feed Compensations: Minimize environmental distractions;Slow rate;Small sips/bites Postural Changes: Seated upright at 90 degrees;Remain upright for at least 30 minutes after po intake    Other  Recommendations Oral Care Recommendations: Oral care BID        Functional Status Assessment Patient has not had a recent decline in their functional status   Swallow Study   General Date of Onset: 05/04/24 HPI: Curtis Hartman is a 63 y.o. male with medical history significant for end-stage renal disease on hemodialysis on Monday Wednesday Fridays, COPD, type 2 diabetes mellitus, GERD, osteoarthritis, hypertension, chronic hepatitis C and peripheral neuropathy, who presented to the emergency room on 05/04/2024 with acute onset of generalized weakness while he was in dialysis with unresponsiveness/syncope after which the patient was mildly confused.  No witnessed seizures.  He stated that he took 30 mg of p.o. morphine  sulfate this morning rather than 15 mg as his dose was recently increased.   MRI on 05/06/2024 revealed IMPRESSION:  1. Truncated exam.     2. Positive for multiple subcentimeter acute lacunar type infarcts,  including the posterior right  frontal lobe white matter, left  lateral cerebellum. Possible additional punctate involvement  including in the right midbrain.  Negative for evidence of acute intracranial hemorrhage or mass  effect.     3. Evidence of underlying moderately advanced chronic small vessel  ischemia in the brain, including chronic lacunar infarcts in the  right pons, chronic microhemorrhage in the left pons and left  cerebellum. Type of Study: Bedside Swallow Evaluation Previous Swallow Assessment: none present in chart Diet Prior to this Study: Dysphagia 3 (mechanical soft);Thin liquids (Level 0) Temperature Spikes Noted: No Respiratory Status: Room air History of Recent Intubation: No Behavior/Cognition: Alert;Cooperative;Pleasant mood Oral Cavity Assessment: Within Functional Limits Oral Care Completed by SLP: No Oral Cavity - Dentition: Adequate natural dentition Vision: Functional for self-feeding Self-Feeding Abilities: Able to feed self Patient Positioning:  (sitting on bedside commode) Baseline Vocal Quality: Normal Volitional Cough: Strong Volitional Swallow: Able to elicit    Oral/Motor/Sensory Function Overall Oral Motor/Sensory Function: Within functional limits   Ice Chips Ice chips: Not tested   Thin Liquid Thin Liquid: Within functional limits Presentation: Self Fed;Straw    Nectar Thick Nectar Thick Liquid: Not tested   Honey Thick Honey Thick Liquid: Not tested   Puree Puree: Within functional limits Presentation: Self Fed;Spoon   Solid     Solid: Within functional limits Presentation: Self Fed     Merdis Snodgrass B. Rubbie, M.S.,  CCC-SLP, Warehouse manager Injury Specialist [facility]  [facility] Office [phone] Ascom [phone] Fax [phone]

## 2024-05-09 ENCOUNTER — Inpatient Hospital Stay (HOSPITAL_COMMUNITY)
Admit: 2024-05-09 | Discharge: 2024-05-09 | Disposition: A | Discharge status: Home / Self Care | Attending: Neurology | Admitting: Neurology

## 2024-05-09 ENCOUNTER — Inpatient Hospital Stay (HOSPITAL_COMMUNITY)

## 2024-05-09 DIAGNOSIS — I639 Cerebral infarction, unspecified: Secondary | ICD-10-CM | POA: Diagnosis not present

## 2024-05-09 DIAGNOSIS — R4182 Altered mental status, unspecified: Secondary | ICD-10-CM

## 2024-05-09 DIAGNOSIS — G9341 Metabolic encephalopathy: Secondary | ICD-10-CM | POA: Diagnosis not present

## 2024-05-09 DIAGNOSIS — R569 Unspecified convulsions: Secondary | ICD-10-CM | POA: Diagnosis not present

## 2024-05-09 DIAGNOSIS — N186 End stage renal disease: Secondary | ICD-10-CM | POA: Diagnosis not present

## 2024-05-09 DIAGNOSIS — J449 Chronic obstructive pulmonary disease, unspecified: Secondary | ICD-10-CM | POA: Diagnosis not present

## 2024-05-09 LAB — GLUCOSE, CAPILLARY
Glucose-Capillary: 88 mg/dL (ref 70–99)
Glucose-Capillary: 109 mg/dL — ABNORMAL HIGH (ref 70–99)
Glucose-Capillary: 252 mg/dL — ABNORMAL HIGH (ref 70–99)
Glucose-Capillary: 72 mg/dL (ref 70–99)
Glucose-Capillary: 177 mg/dL — ABNORMAL HIGH (ref 70–99)

## 2024-05-09 LAB — CBC WITH DIFFERENTIAL/PLATELET
Abs Immature Granulocytes: 0.05 K/uL (ref 0.00–0.07)
Basophils Absolute: 0.1 K/uL (ref 0.0–0.1)
Basophils Relative: 1 %
Eosinophils Absolute: 0.2 K/uL (ref 0.0–0.5)
Eosinophils Relative: 3 %
HCT: 36.2 % — ABNORMAL LOW (ref 39.0–52.0)
Hemoglobin: 11.5 g/dL — ABNORMAL LOW (ref 13.0–17.0)
Immature Granulocytes: 1 %
Lymphocytes Relative: 16 %
Lymphs Abs: 1.4 K/uL (ref 0.7–4.0)
MCH: 28.8 pg (ref 26.0–34.0)
MCHC: 31.8 g/dL (ref 30.0–36.0)
MCV: 90.7 fL (ref 80.0–100.0)
Monocytes Absolute: 1 K/uL (ref 0.1–1.0)
Monocytes Relative: 11 %
Neutro Abs: 6.1 K/uL (ref 1.7–7.7)
Neutrophils Relative %: 68 %
Platelets: 352 K/uL (ref 150–400)
RBC: 3.99 MIL/uL — ABNORMAL LOW (ref 4.22–5.81)
RDW: 13.9 % (ref 11.5–15.5)
WBC: 8.9 K/uL (ref 4.0–10.5)
nRBC: 0 % (ref 0.0–0.2)

## 2024-05-09 LAB — RENAL FUNCTION PANEL
Albumin: 2.8 g/dL — ABNORMAL LOW (ref 3.5–5.0)
Anion gap: 15 (ref 5–15)
BUN: 54 mg/dL — ABNORMAL HIGH (ref 8–23)
CO2: 25 mmol/L (ref 22–32)
Calcium: 8.9 mg/dL (ref 8.9–10.3)
Chloride: 96 mmol/L — ABNORMAL LOW (ref 98–111)
Creatinine, Ser: 6.15 mg/dL — ABNORMAL HIGH (ref 0.61–1.24)
GFR, Estimated: 10 mL/min — ABNORMAL LOW (ref >60)
Glucose, Bld: 101 mg/dL — ABNORMAL HIGH (ref 70–99)
Phosphorus: 6.7 mg/dL — ABNORMAL HIGH (ref 2.5–4.6)
Potassium: 5 mmol/L (ref 3.5–5.1)
Sodium: 136 mmol/L (ref 135–145)

## 2024-05-09 LAB — HEPATITIS B SURFACE ANTIBODY, QUANTITATIVE: Hep B S AB Quant (Post): <3.5 m[IU]/mL — ABNORMAL LOW

## 2024-05-09 MED ORDER — INSULIN ASPART 100 UNIT/ML IJ SOLN
3.0000 [IU] | Freq: Three times a day (TID) | INTRAMUSCULAR | Status: DC
  Administered 2024-05-09 – 2024-05-10 (×3): 3 [IU] via SUBCUTANEOUS

## 2024-05-09 MED ORDER — SACCHAROMYCES BOULARDII 250 MG PO CAPS: 250.0000 mg | ORAL_CAPSULE | Freq: Two times a day (BID) | ORAL | 0 refills | Status: AC

## 2024-05-09 MED ORDER — ASPIRIN 81 MG PO CHEW: 81.0000 mg | CHEWABLE_TABLET | Freq: Every day | ORAL | 2 refills | Status: DC

## 2024-05-09 MED ORDER — AMOXICILLIN-POT CLAVULANATE 875-125 MG PO TABS: 1.0000 | ORAL_TABLET | Freq: Two times a day (BID) | ORAL | 0 refills | Status: DC

## 2024-05-09 MED ORDER — CLOPIDOGREL BISULFATE 75 MG PO TABS: 75.0000 mg | ORAL_TABLET | Freq: Every day | ORAL | 0 refills | Status: AC

## 2024-05-09 NOTE — Procedures (Signed)
 Patient Name: JUSITN SALSGIVER  MRN: [id number]  Epilepsy Attending: Arlin MALVA Krebs  Referring Physician/Provider: Krebs Arlin MALVA, MD  Date: 05/09/2024 Duration: 22.06 mins  Patient history: 63 y.o. 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 EEG to evaluate for seizure  Level of alertness: Awake, asleep  AEDs during EEG study: None  Technical aspects: This EEG study was done with scalp electrodes positioned according to the 10-20 International system of electrode placement. Electrical activity was reviewed with band pass filter of 1-70Hz , sensitivity of 7 uV/mm, display speed of 78mm/sec with a 60Hz  notched filter applied as appropriate. EEG data were recorded continuously and digitally stored.  Video monitoring was available and reviewed as appropriate.  Description: The posterior dominant rhythm consists of 7.5 Hz activity of moderate voltage (25-35 uV) seen predominantly in posterior head regions, symmetric and reactive to eye opening and eye closing. Sleep was characterized by vertex waves, sleep spindles (12 to 14 Hz), maximal frontocentral region. EEG showed intermittent generalized  3 to 6 Hz theta-delta slowing. Hyperventilation and photic stimulation were not performed.     ABNORMALITY - Intermittent slow, generalized  IMPRESSION: This study is suggestive of mild diffuse encephalopathy. No seizures or epileptiform discharges were seen throughout the recording.  Hilja Kintzel O Vonzell Lindblad

## 2024-05-09 NOTE — Progress Notes (Signed)
 [facility]  No complaints about perianal region. Close to going home.  BP 110/75 (BP Location: Right Arm)   Pulse 60   Temp 98 F (36.7 C) (Oral)   Resp 18   Ht 5' 5 (1.651 m)   Wt 62 kg   SpO2 100%   BMI 22.75 kg/m  Sitting in chair  Patient s/p I&D of perianal abscess. Culture with WBC but no growth. Given his co-morbidities I would still send on Augmentin  to complete a course.  Sitz baths and keep area clean after Bm Keep stools regular and soft  Future Appointments  Date Time Provider Department Center  06/02/2024  1:45 PM Kallie Manuelita BROCKS, MD [facility] None    Manuelita Kallie, MD [facility] [address] [place], [place] [address] [phone] (office)

## 2024-05-09 NOTE — Plan of Care (Signed)

## 2024-05-09 NOTE — Progress Notes (Signed)
 05/09/2024 2:18 PM  DC held today, no HD nurse at [facility] today, spoke with nephrologist, he can have HD tomorrow, anticipate DC home tomorrow after HD. Updated staff.  KYM Louder, MD

## 2024-05-09 NOTE — Discharge Summary (Addendum)
 Physician Discharge Summary  Curtis Hartman FMW:[id number] DOB: 12-Sep-1961 DOA: 05/04/2024  PCP: Gerome Tillman LITTIE, FNP  Admit date: 05/04/2024 Discharge date: 05/09/2024  Admitted From:  HOME  Disposition:  HOME with outpatient PT   Recommendations for Outpatient Follow-up:  Follow up with PCP in 1 weeks Resume regular outpatient HD schedule AVOID MORPHINE  AND LIMIT GABAPENTIN  TO 300 mg DAILY if needed Follow up with Dr. Kallie 05/19/24  Follow up with [facility] in 4-6 weeks for stroke follow up  Continue plavix  and aspirin  daily thru 05/27/24, then stop plavix  but continue aspirin  daily  A cardiac monitor will be mailed to patient arranged by [facility] Office   Post Operative Instructions after Perianal Abscess Treatment -Keep your stools soft and have a BM daily. Take fiber (Metamucil) over the counter daily. Take Colace twice daily if fiber does not keep your stools soft.  Take Miralax  as needed if you still have not had a BM in 2 Days. -Take Sitz Baths (warm water baths) every 4-6 hours for the first week and after every BM and when having discomfort.  -Take Tylenol  and Narcotic pain medication for breakthrough pain. -Some bleeding is normal, but if you have excessive bleeding, fevers, chills, or more pain than prior, call or go to the ED. -Wear a pad for drainage as needed.   Home Health: OUTPATIENT PT   Discharge Condition: STABLE   CODE STATUS: FULL DIET: RESUME RENAL DIET    Brief Hospitalization Summary: Please see all hospital notes, images, labs for full details of the hospitalization. ADMISSION PROVIDER HPI:   63 y.o. male with medical history significant for end-stage renal disease on hemodialysis on Monday Wednesday Fridays, COPD, type 2 diabetes mellitus, GERD, osteoarthritis, hypertension, chronic hepatitis C and peripheral neuropathy, who presented to the emergency room with acute onset of generalized weakness while he was in dialysis with  unresponsiveness/syncope after which the patient was mildly confused.  No witnessed seizures.  He stated that he took 30 mg of p.o. morphine  sulfate this morning rather than 15 mg as his dose was recently increased.  He denied any fever or chills.  No chest pain or dyspnea or cough or wheezing.  No dysuria, urinary frequency or urgency, hematuria or flank pain.  He was unable to stand in the ER due to tremors in all 4 extremities.  He had diarrhea couple days ago which has resolved.  He admits to lower abdominal suprapubic pain.  He continues to smoke 5 cigarettes/day.  No paresthesias or focal muscle weakness.  No headache or dizziness or blurred vision.  No tinnitus or vertigo.  No bleeding diathesis.   ED Course: When he came to the ER, temperature was 99.5 and otherwise vital signs were within normal.  Labs revealed a BUN of 52 and creatinine 6.24, albumin  of 3.2 and CBC showed leukocytosis of 11.2 with neutrophilia and mild anemia.  GI panel was sent by the ED physician.   Imaging: Abdominal pelvic CT scan without contrast: 1. No hydronephrosis or nephrolithiasis. 2. No bowel obstruction. Normal appendix. 3. Right perianal collection or developing abscess. Ultrasound may provide better evaluation. 4.  Aortic Atherosclerosis.  HOSPITAL COURSE BY LISTED PROBLEMS ADDRESSED   Acute metabolic encephalopathy--IMPROVED - associated with unresponsiveness/syncope before arrival - suspect related to adverse reaction to morphine  - DC morphine  -- agree with nephrologist; avoid morphine , can use hydromorphine, fentanyl , methadone for pain - we followed neurochecks  -- he is back to baseline; had dialysis treatment on 7/3,  ordered MRI brain on 7/4  -- DC gabapentin  due to ongoing confusion    Acute CVA  -- appreciate neurologist consultation and recommendations  -- pt starting aspirin  and plavix  therapy for secondary prevention  -- MRA ordered and no findings of severe intracranial stenosis, therefore  recommendation is for 3 weeks of DAPT, followed by aspirin  81 mg daily  -- stroke work completed -- follow up lipid panel -- LDL is optimal at 67 therefore statin was NOT started  -- bedside swallow test was reassuring and placed on dysphagia diet for now -- smoking cessation counseling at bedside -- request sent to [facility] office to provide him a 30 day cardiac monitor per neuro recs   End-stage renal disease on hemodialysis - He missed his hemodialysis session on 7/2, received inpatient HD on 7/3, 7/5, 7/7 - Nephrology consult appreciated   Chronic obstructive pulmonary disease (COPD)  - Will continue his inhalers.   Type 2 diabetes mellitus with peripheral neuropathy - The patient will be placed on supplemental coverage with NovoLog . - Will continue basal coverage  -- added SSI coverage in hospital only -- resume home treatments at discharge with CBG monitoring CBG (last 3)  Recent Labs (last 2 labs)       Recent Labs    05/08/24 0410 05/08/24 0746 05/08/24 1141  GLUCAP 107* 99 134*      Essential hypertension Will continue antihypertensive therapy Ok for normotension per neuro   Peri-anal abscess -- given his AMS and increasing WBC and fever, I asked for surgery consult, pt had I&D at bedside and now on zosyn  -- pt did well, postprocedure instructions given to patient per surgeon and pt will follow up with Surgeon 7/17. DC home on 5 more days of augmentin .    Discharge Diagnoses:  Principal Problem:   Acute CVA (cerebrovascular accident) [facility]) Active Problems:   Acute metabolic encephalopathy   Essential hypertension   Type 2 diabetes mellitus with peripheral neuropathy (HCC)   Chronic obstructive pulmonary disease (COPD) (HCC)   End-stage renal disease on hemodialysis (HCC)   Perianal abscess   Discharge Instructions: Discharge Instructions     Ambulatory referral to Neurology   Complete by: As directed    An appointment is requested in  approximately: 8-12 weeks   Ambulatory referral to Physical Therapy   Complete by: As directed       Allergies as of 05/09/2024   No Known Allergies      Medication List     STOP taking these medications    gabapentin  400 MG capsule Commonly known as: NEURONTIN    morphine  30 MG tablet Commonly known as: MSIR   sodium bicarbonate  650 MG tablet       TAKE these medications    acetaminophen  325 MG tablet Commonly known as: TYLENOL  Take 1-2 tablets (325-650 mg total) by mouth every 4 (four) hours as needed for mild pain (pain score 1-3).   albuterol  108 (90 Base) MCG/ACT inhaler Commonly known as: ProAir  HFA 2 puffs every 4 hours as needed only  if your can't catch your breath   amLODipine  10 MG tablet Commonly known as: NORVASC  Take 10 mg by mouth daily as needed (diastolic blood pressure above 90).   amoxicillin -clavulanate 875-125 MG tablet Commonly known as: AUGMENTIN  Take 1 tablet by mouth 2 (two) times daily for 5 days.   aspirin  81 MG chewable tablet Chew 1 tablet (81 mg total) by mouth daily. Start taking on: May 10, 2024  Breztri  Aerosphere 160-9-4.8 MCG/ACT Aero inhaler Generic drug: budesonide-glycopyrrolate-formoterol  Inhale 2 puffs into the lungs in the morning and at bedtime. What changed:  when to take this reasons to take this   carvedilol  3.125 MG tablet Commonly known as: COREG  Take 1 tablet (3.125 mg total) by mouth 2 (two) times daily with a meal.   clopidogrel  75 MG tablet Commonly known as: PLAVIX  Take 1 tablet (75 mg total) by mouth daily for 17 days. Start taking on: May 10, 2024   diphenoxylate -atropine  2.5-0.025 MG tablet Commonly known as: LOMOTIL  Take 1 tablet by mouth 4 (four) times daily.   fluticasone  50 MCG/ACT nasal spray Commonly known as: FLONASE  Place 2 sprays into both nostrils daily.   furosemide  80 MG tablet Commonly known as: LASIX  Take 80 mg by mouth daily as needed for edema or fluid.   hydrOXYzine  25 MG  tablet Commonly known as: ATARAX  Take 25 mg by mouth daily.   Lantus  100 UNIT/ML injection Generic drug: insulin  glargine Inject 5 Units into the skin at bedtime as needed (blood sugar over 150).   oxybutynin  10 MG 24 hr tablet Commonly known as: DITROPAN -XL Take 10 mg by mouth daily.   saccharomyces boulardii 250 MG capsule Commonly known as: FLORASTOR Take 1 capsule (250 mg total) by mouth 2 (two) times daily for 14 days.        Follow-up Information     Kallie Manuelita BROCKS, MD Follow up on 05/19/2024.   Specialty: General Surgery Contact information: [address] [place] [facility] [address] [phone]         Gerome Tillman CROME, FNP. Schedule an appointment as soon as possible for a visit in 1 week(s).   Specialty: Family Medicine Why: Hospital Follow Up Contact information: [address] [place] [address] [phone]         [facility]. Schedule an appointment as soon as possible for a visit in 1 month(s).   Specialty: Neurology Why: Hospital Follow Up Contact information: [address] [place]  [address] [phone]               No Known Allergies Allergies as of 05/09/2024   No Known Allergies      Medication List     STOP taking these medications    gabapentin  400 MG capsule Commonly known as: NEURONTIN    morphine  30 MG tablet Commonly known as: MSIR   sodium bicarbonate  650 MG tablet       TAKE these medications    acetaminophen  325 MG tablet Commonly known as: TYLENOL  Take 1-2 tablets (325-650 mg total) by mouth every 4 (four) hours as needed for mild pain (pain score 1-3).   albuterol  108 (90 Base) MCG/ACT inhaler Commonly known as: ProAir  HFA 2 puffs every 4 hours as needed only  if your can't catch your breath   amLODipine  10 MG tablet Commonly known as: NORVASC  Take 10 mg by mouth daily as needed (diastolic blood pressure above 90).    amoxicillin -clavulanate 875-125 MG tablet Commonly known as: AUGMENTIN  Take 1 tablet by mouth 2 (two) times daily for 5 days.   aspirin  81 MG chewable tablet Chew 1 tablet (81 mg total) by mouth daily. Start taking on: May 10, 2024   Breztri  Aerosphere 160-9-4.8 MCG/ACT Aero inhaler Generic drug: budesonide-glycopyrrolate-formoterol  Inhale 2 puffs into the lungs in the morning and at bedtime. What changed:  when to take this reasons to take this   carvedilol  3.125 MG tablet  Commonly known as: COREG  Take 1 tablet (3.125 mg total) by mouth 2 (two) times daily with a meal.   clopidogrel  75 MG tablet Commonly known as: PLAVIX  Take 1 tablet (75 mg total) by mouth daily for 17 days. Start taking on: May 10, 2024   diphenoxylate -atropine  2.5-0.025 MG tablet Commonly known as: LOMOTIL  Take 1 tablet by mouth 4 (four) times daily.   fluticasone  50 MCG/ACT nasal spray Commonly known as: FLONASE  Place 2 sprays into both nostrils daily.   furosemide  80 MG tablet Commonly known as: LASIX  Take 80 mg by mouth daily as needed for edema or fluid.   hydrOXYzine  25 MG tablet Commonly known as: ATARAX  Take 25 mg by mouth daily.   Lantus  100 UNIT/ML injection Generic drug: insulin  glargine Inject 5 Units into the skin at bedtime as needed (blood sugar over 150).   oxybutynin  10 MG 24 hr tablet Commonly known as: DITROPAN -XL Take 10 mg by mouth daily.   saccharomyces boulardii 250 MG capsule Commonly known as: FLORASTOR Take 1 capsule (250 mg total) by mouth 2 (two) times daily for 14 days.        Procedures/Studies: DG CHEST PORT 1 VIEW Result Date: 05/07/2024 CLINICAL DATA:  Fever. EXAM: PORTABLE CHEST 1 VIEW COMPARISON:  Radiographs 05/04/2024. FINDINGS: 0805 hours. The heart size and mediastinal contours are normal. The lungs are clear. There is no pleural effusion or pneumothorax. No acute osseous findings are identified. Mild degenerative changes in the spine. Previous lower  cervical fusion, partially imaged. IMPRESSION: No evidence of acute cardiopulmonary process. Electronically Signed   By: Elsie Perone M.D.   On: 05/07/2024 12:08   MR ANGIO HEAD WO CONTRAST Result Date: 05/06/2024 CLINICAL DATA:  Stroke follow-up EXAM: MRA HEAD WITHOUT CONTRAST TECHNIQUE: Angiographic images of the Circle of Willis were acquired using MRA technique without intravenous contrast. COMPARISON:  MRI head earlier same day. FINDINGS: Anterior circulation: The intracranial internal carotid arteries are patent. Atherosclerosis noted along the bilateral cavernous ICAs without evidence of high-grade stenosis. The right M1 segment and right MCA bifurcation are patent. Proximal right M2 branches are patent. Distal right MCA branches are patent. The left M1 segment and left MCA bifurcation are patent. Proximal left M2 branches are patent. Distal left MCA branches are patent. The A1 and A2 segments are patent bilaterally. Distal ACA branches are symmetric. Posterior circulation: The visualized intracranial vertebral arteries are patent. Basilar artery is patent. The bilateral posterior cerebral arteries are patent. Superior cerebellar arteries are patent bilaterally. The PICA patent bilaterally. Anatomic variants: None significant Other: None. IMPRESSION: Patent intracranial arterial vasculature. Atherosclerosis of the bilateral cavernous ICAs without high-grade stenosis. Electronically Signed   By: Donnice Mania M.D.   On: 05/06/2024 17:00   MR BRAIN WO CONTRAST Result Date: 05/06/2024 CLINICAL DATA:  63 year old male with altered mental status. End stage renal disease. Weakness. EXAM: MRI HEAD WITHOUT CONTRAST TECHNIQUE: Multiplanar, multiecho pulse sequences of the brain and surrounding structures were obtained without intravenous contrast. COMPARISON:  None Available. FINDINGS: The examination was discontinued prior to completion by the patient. Diagnostic axial and coronal DWI, axial FLAIR and T2 *  imaging only was obtained. Punctate superior posterior right frontal lobe white matter restricted diffusion on series 2, image 42 and series 3, image 42. No discrete FLAIR hyperintensity. Additional subcentimeter focus of restricted diffusion in the left lateral cerebellum on series 2, image 9. Furthermore, difficult to exclude additional punctate restricted diffusion in the medial left parietal lobe subcortical white matter (also on  image 42), right midbrain (series 2, image 25). No midline shift, mass effect, evidence of mass lesion, ventriculomegaly, extra-axial collection or acute intracranial hemorrhage. Vague but confluent periventricular white matter T2 hyperintensity. This is moderately advanced. Chronic lacunar infarct in the right pons on series 6, image 17. Additional pontine signal heterogeneity. And T2 * images do suggest chronic microhemorrhage in the left pons, also the left cerebellum. No definite cortical encephalomalacia. Chronic small vessel ischemia and microhemorrhage in the brainstem. Confluent cerebral white matter signal changes also most commonly due to small vessel disease. IMPRESSION: 1. Truncated exam. 2. Positive for multiple subcentimeter acute lacunar type infarcts, including the posterior right frontal lobe white matter, left lateral cerebellum. Possible additional punctate involvement including in the right midbrain. Negative for evidence of acute intracranial hemorrhage or mass effect. 3. Evidence of underlying moderately advanced chronic small vessel ischemia in the brain, including chronic lacunar infarcts in the right pons, chronic microhemorrhage in the left pons and left cerebellum. Electronically Signed   By: VEAR Hurst M.D.   On: 05/06/2024 11:04   ECHOCARDIOGRAM COMPLETE Result Date: 05/05/2024    ECHOCARDIOGRAM REPORT   Patient Name:   BAILEN GEFFRE Date of Exam: 05/05/2024 Medical Rec #:  [id number]        Height:       69.0 in Accession #:    [id number]       Weight:        138.4 lb Date of Birth:  [birth year]        BSA:          1.767 m Patient Age:    62 years         BP:           133/70 mmHg Patient Gender: M                HR:           84 bpm. Exam Location:  Inpatient Procedure: 2D Echo, Color Doppler and Cardiac Doppler (Both Spectral and Color            Flow Doppler were utilized during procedure). Indications:    Syncope  History:        Patient has no prior history of Echocardiogram examinations.  Sonographer:    Tinnie Gosling RDCS Referring Phys: [phone] Michal Callicott L Ryhanna Dunsmore IMPRESSIONS  1. Left ventricular ejection fraction, by estimation, is 65 to 70%. The left ventricle has normal function. The left ventricle has no regional wall motion abnormalities. There is moderate concentric left ventricular hypertrophy. Left ventricular diastolic parameters were normal.  2. Right ventricular systolic function is normal. The right ventricular size is normal. Tricuspid regurgitation signal is inadequate for assessing PA pressure.  3. Left atrial size was moderately dilated.  4. The mitral valve is degenerative. Trivial mitral valve regurgitation.  5. The aortic valve is tricuspid. There is mild calcification of the aortic valve. Aortic valve regurgitation is not visualized.  6. The inferior vena cava is normal in size with greater than 50% respiratory variability, suggesting right atrial pressure of 3 mmHg. Comparison(s): No prior Echocardiogram. FINDINGS  Left Ventricle: Left ventricular ejection fraction, by estimation, is 65 to 70%. The left ventricle has normal function. The left ventricle has no regional wall motion abnormalities. The left ventricular internal cavity size was normal in size. There is  moderate concentric left ventricular hypertrophy. Left ventricular diastolic parameters were normal. Right Ventricle: The right ventricular size is normal. No increase in right ventricular wall  thickness. Right ventricular systolic function is normal. Tricuspid regurgitation signal is  inadequate for assessing PA pressure. Left Atrium: Left atrial size was moderately dilated. Right Atrium: Right atrial size was normal in size. Pericardium: Trivial pericardial effusion is present. The pericardial effusion is posterior to the left ventricle. Mitral Valve: The mitral valve is degenerative in appearance. There is mild calcification of the mitral valve leaflet(s). Mild mitral annular calcification. Trivial mitral valve regurgitation. Tricuspid Valve: The tricuspid valve is grossly normal. Tricuspid valve regurgitation is mild. Aortic Valve: The aortic valve is tricuspid. There is mild calcification of the aortic valve. There is mild aortic valve annular calcification. Aortic valve regurgitation is not visualized. Pulmonic Valve: The pulmonic valve was grossly normal. Pulmonic valve regurgitation is trivial. Aorta: The aortic root is normal in size and structure. Venous: The inferior vena cava is normal in size with greater than 50% respiratory variability, suggesting right atrial pressure of 3 mmHg. IAS/Shunts: No atrial level shunt detected by color flow Doppler. Additional Comments: 3D was performed not requiring image post processing on an independent workstation and was indeterminate.  LEFT VENTRICLE PLAX 2D LVIDd:         4.90 cm   Diastology LVIDs:         2.90 cm   LV e' medial:    9.14 cm/s LV PW:         1.50 cm   LV E/e' medial:  9.7 LV IVS:        1.50 cm   LV e' lateral:   8.16 cm/s LVOT diam:     2.50 cm   LV E/e' lateral: 10.9 LV SV:         90 LV SV Index:   51 LVOT Area:     4.91 cm  RIGHT VENTRICLE             IVC RV S prime:     20.00 cm/s  IVC diam: 1.10 cm TAPSE (M-mode): 3.5 cm LEFT ATRIUM           Index        RIGHT ATRIUM           Index LA diam:      3.50 cm 1.98 cm/m   RA Area:     13.70 cm LA Vol (A4C): 88.5 ml 50.08 ml/m  RA Volume:   32.50 ml  18.39 ml/m  AORTIC VALVE LVOT Vmax:   93.80 cm/s LVOT Vmean:  59.800 cm/s LVOT VTI:    0.184 m  AORTA Ao Root diam: 3.00 cm Ao  Asc diam:  3.10 cm MITRAL VALVE MV Area (PHT): 4.29 cm    SHUNTS MV Decel Time: 177 msec    Systemic VTI:  0.18 m MV E velocity: 88.80 cm/s  Systemic Diam: 2.50 cm MV A velocity: 77.80 cm/s MV E/A ratio:  1.14 Jayson Sierras MD Electronically signed by Jayson Sierras MD Signature Date/Time: 05/05/2024/3:49:27 PM    Final    US  Carotid Bilateral Result Date: 05/05/2024 CLINICAL DATA:  63 year old male with history of syncope and collapse. EXAM: BILATERAL CAROTID DUPLEX ULTRASOUND TECHNIQUE: Elnor scale imaging, color Doppler and duplex ultrasound were performed of bilateral carotid and vertebral arteries in the neck. COMPARISON:  None Available. FINDINGS: Criteria: Quantification of carotid stenosis is based on velocity parameters that correlate the residual internal carotid diameter with NASCET-based stenosis levels, using the diameter of the distal internal carotid lumen as the denominator for stenosis measurement. The following velocity measurements were obtained: RIGHT  ICA: Peak systolic velocity 119 cm/sec, End diastolic velocity 35 cm/sec CCA: Peak systolic velocity 113 cm/sec SYSTOLIC ICA/CCA RATIO:  1.1 ECA: Peak systolic velocity 162 cm/sec LEFT ICA: Peak systolic velocity 152 cm/sec, End diastolic velocity 25 cm/sec CCA: 110 cm/sec SYSTOLIC ICA/CCA RATIO:  1.4 ECA: 4138 cm/sec RIGHT CAROTID ARTERY: Moderate scattered atherosclerotic plaque formation most prominent about the carotid bulb and bifurcation. No significant tortuosity. Normal low resistance waveforms. RIGHT VERTEBRAL ARTERY:  Antegrade flow. LEFT CAROTID ARTERY: Moderate scattered atherosclerotic plaque formationmost prominent about the carotid bulb and bifurcation. No significant tortuosity. Normal low resistance waveforms. LEFT VERTEBRAL ARTERY:  Antegrade flow. Upper extremity non-invasive blood pressures: Not obtained. IMPRESSION: 1. Right carotid artery system: Less than 50% stenosis secondary to moderate scattered atherosclerotic plaque  formation, most prominent about the carotid bulb and bifurcation. 2. Left carotid artery system: Less than 50% stenosis secondary to moderate scattered atherosclerotic plaque formation, most prominent about the carotid bulb and bifurcation. 3.  Vertebral artery system: Patent with antegrade flow bilaterally. Ester Sides, MD [facility] Electronically Signed   By: Ester Sides M.D.   On: 05/05/2024 10:44   CT ABDOMEN PELVIS WO CONTRAST Result Date: 05/04/2024 CLINICAL DATA:  Abdominal pain. EXAM: CT ABDOMEN AND PELVIS WITHOUT CONTRAST TECHNIQUE: Multidetector CT imaging of the abdomen and pelvis was performed following the standard protocol without IV contrast. RADIATION DOSE REDUCTION: This exam was performed according to the departmental dose-optimization program which includes automated exposure control, adjustment of the mA and/or kV according to patient size and/or use of iterative reconstruction technique. COMPARISON:  None Available. FINDINGS: Evaluation of this exam is limited in the absence of intravenous contrast. Evaluation is also limited due to streak artifact caused by patient's arms and spinal hardware. Lower chest: The visualized lung bases are clear. There is coronary vascular calcification. No intra-abdominal free air or free fluid. Hepatobiliary: The liver is unremarkable. No biliary dilatation. The gallbladder is unremarkable. Pancreas: Unremarkable. No pancreatic ductal dilatation or surrounding inflammatory changes. Spleen: Normal in size without focal abnormality. Adrenals/Urinary Tract: The right adrenal gland is unremarkable. A 2 cm left adrenal adenoma. There is no hydronephrosis or nephrolithiasis on either side. The visualized ureters and urinary bladder appear unremarkable. Stomach/Bowel: There is no bowel obstruction or active inflammation. The appendix is normal. Vascular/Lymphatic: Moderate aortoiliac atherosclerotic  disease. The IVC is unremarkable. No portal venous gas. There is no adenopathy. Reproductive: The prostate and seminal vesicles are grossly unremarkable. No pelvic mass. Other: Partially visualized 2.5 x 5.0 cm indurated area in the subcutaneous soft tissues of the right buttock adjacent to the intergluteal cleft suspicious for a perianal collection or developing abscess. Ultrasound may provide better evaluation. Musculoskeletal: Osteopenia with degenerative changes of the spine. Multilevel posterior fusion hardware. No acute osseous pathology. IMPRESSION: 1. No hydronephrosis or nephrolithiasis. 2. No bowel obstruction. Normal appendix. 3. Right perianal collection or developing abscess. Ultrasound may provide better evaluation. 4.  Aortic Atherosclerosis (ICD10-I70.0). Electronically Signed   By: Vanetta Chou M.D.   On: 05/04/2024 17:44   DG Chest Port 1 View Result Date: 05/04/2024 CLINICAL DATA:  106001 Syncope 106001 EXAM: PORTABLE CHEST - 1 VIEW COMPARISON:  None available. FINDINGS: No focal airspace consolidation, pleural effusion, or pneumothorax. No cardiomegaly. Aortic atherosclerosis. No acute fracture or destructive lesions. Multilevel thoracic osteophytosis. Partially visualized cervical fusion hardware IMPRESSION: No acute cardiopulmonary abnormality. Electronically Signed   By: Rogelia Myers M.D.   On: 05/04/2024 16:32     Subjective:  Pt reports feeling much better having a lot of questions about his stroke.   Discharge Exam: Vitals:   05/09/24 0332 05/09/24 0846  BP: 119/63 110/75  Pulse: 73 60  Resp: 18   Temp: 98 F (36.7 C) 98 F (36.7 C)  SpO2: 100% 100%   Vitals:   05/08/24 1437 05/08/24 2012 05/09/24 0332 05/09/24 0846  BP: 137/81 (!) 179/85 119/63 110/75  Pulse: 65 71 73 60  Resp: 18 18 18    Temp: 98.1 F (36.7 C) 98.2 F (36.8 C) 98 F (36.7 C) 98 F (36.7 C)  TempSrc: Oral Oral Oral Oral  SpO2: 100% 99% 100% 100%  Weight:      Height:       General:  Pt is alert, awake, not in acute distress Cardiovascular: normal S1/S2 +, no rubs, no gallops Respiratory: CTA bilaterally, no wheezing, no rhonchi Abdominal: Soft, NT, ND, bowel sounds + Extremities: no edema, no cyanosis   The results of significant diagnostics from this hospitalization (including imaging, microbiology, ancillary and laboratory) are listed below for reference.     Microbiology: Recent Results (from the past 240 hours)  Gastrointestinal Panel by PCR , Stool     Status: None   Collection Time: 05/04/24  4:39 PM   Specimen: STOOL  Result Value Ref Range Status   Campylobacter species NOT DETECTED NOT DETECTED Final   Plesimonas shigelloides NOT DETECTED NOT DETECTED Final   Salmonella species NOT DETECTED NOT DETECTED Final   Yersinia enterocolitica NOT DETECTED NOT DETECTED Final   Vibrio species NOT DETECTED NOT DETECTED Final   Vibrio cholerae NOT DETECTED NOT DETECTED Final   Enteroaggregative E coli (EAEC) NOT DETECTED NOT DETECTED Final   Enteropathogenic E coli (EPEC) NOT DETECTED NOT DETECTED Final   Enterotoxigenic E coli (ETEC) NOT DETECTED NOT DETECTED Final   Shiga like toxin producing E coli (STEC) NOT DETECTED NOT DETECTED Final   Shigella/Enteroinvasive E coli (EIEC) NOT DETECTED NOT DETECTED Final   Cryptosporidium NOT DETECTED NOT DETECTED Final   Cyclospora cayetanensis NOT DETECTED NOT DETECTED Final   Entamoeba histolytica NOT DETECTED NOT DETECTED Final   Giardia lamblia NOT DETECTED NOT DETECTED Final   Adenovirus F40/41 NOT DETECTED NOT DETECTED Final   Astrovirus NOT DETECTED NOT DETECTED Final   Norovirus GI/GII NOT DETECTED NOT DETECTED Final   Rotavirus A NOT DETECTED NOT DETECTED Final   Sapovirus (I, II, IV, and V) NOT DETECTED NOT DETECTED Final    Comment: Performed at [facility], [address] [place], [place] [address]  Aerobic/Anaerobic Culture w Gram Stain (surgical/deep wound)     Status: None (Preliminary result)    Collection Time: 05/07/24 10:48 AM   Specimen: Perirectal; Abscess  Result Value Ref Range Status   Specimen Description   Final    PERIRECTAL ABSCESS Performed at [facility], [address] [place], [place] [address]    Special Requests   Final    PERIANUS Performed at [facility], [address] [place], [place] [address]    Gram Stain   Final    RARE WBC PRESENT, PREDOMINANTLY PMN NO ORGANISMS SEEN    Culture   Final    CULTURE REINCUBATED FOR BETTER GROWTH HOLDING FOR POSSIBLE ANAEROBE Performed at [facility], [address] [place], [place] [address]    Report Status PENDING  Incomplete     Labs: BNP (last 3 results) No results for input(s): BNP in the last 8760 hours. Basic  Metabolic Panel: Recent Labs  Lab 05/05/24 0433 05/06/24 1137 05/07/24 0301 05/08/24 0330 05/09/24 0504  NA 143 134* 133* 134* 136  K 5.0 4.6 4.5 4.3 5.0  CL 108 97* 98 93* 96*  CO2 23 27 25 27 25   GLUCOSE 101* 95 70 100* 101*  BUN 55* 36* 49* 36* 54*  CREATININE 7.00* 5.16* 6.30* 4.53* 6.15*  CALCIUM  8.3* 8.4* 8.3* 8.6* 8.9  PHOS  --  5.5* 6.4* 5.0* 6.7*   Liver Function Tests: Recent Labs  Lab 05/04/24 1606 05/06/24 1137 05/07/24 0301 05/08/24 0330 05/09/24 0504  AST 14*  --   --   --   --   ALT 9  --   --   --   --   ALKPHOS 52  --   --   --   --   BILITOT 0.6  --   --   --   --   PROT 6.6  --   --   --   --   ALBUMIN  3.2* 2.7* 2.7* 2.6* 2.8*   No results for input(s): LIPASE, AMYLASE in the last 168 hours. Recent Labs  Lab 05/06/24 1211  AMMONIA 14   CBC: Recent Labs  Lab 05/04/24 1606 05/05/24 0433 05/07/24 0301 05/08/24 0330 05/09/24 0504  WBC 11.2* 10.6* 14.5* 10.0 8.9  NEUTROABS 9.4*  --   --  7.5 6.1  HGB 12.1* 11.3* 10.4* 10.9* 11.5*  HCT 37.6* 35.1* 33.7* 34.9* 36.2*  MCV 93.8 94.1 94.1 92.6 90.7  PLT 264 286 281 318 352   Cardiac Enzymes: No results for input(s): CKTOTAL, CKMB, CKMBINDEX, TROPONINI in the last 168  hours. BNP: Invalid input(s): POCBNP CBG: Recent Labs  Lab 05/08/24 2010 05/08/24 2353 05/09/24 0334 05/09/24 0732 05/09/24 1139  GLUCAP 212* 81 88 109* 252*   D-Dimer No results for input(s): DDIMER in the last 72 hours. Hgb A1c No results for input(s): HGBA1C in the last 72 hours. Lipid Profile No results for input(s): CHOL, HDL, LDLCALC, TRIG, CHOLHDL, LDLDIRECT in the last 72 hours. Thyroid function studies No results for input(s): TSH, T4TOTAL, T3FREE, THYROIDAB in the last 72 hours.  Invalid input(s): FREET3 Anemia work up No results for input(s): VITAMINB12, FOLATE, FERRITIN, TIBC, IRON , RETICCTPCT in the last 72 hours. Urinalysis No results found for: COLORURINE, APPEARANCEUR, LABSPEC, PHURINE, GLUCOSEU, HGBUR, BILIRUBINUR, KETONESUR, PROTEINUR, UROBILINOGEN, NITRITE, LEUKOCYTESUR Sepsis Labs Recent Labs  Lab 05/05/24 0433 05/07/24 0301 05/08/24 0330 05/09/24 0504  WBC 10.6* 14.5* 10.0 8.9   Microbiology Recent Results (from the past 240 hours)  Gastrointestinal Panel by PCR , Stool     Status: None   Collection Time: 05/04/24  4:39 PM   Specimen: STOOL  Result Value Ref Range Status   Campylobacter species NOT DETECTED NOT DETECTED Final   Plesimonas shigelloides NOT DETECTED NOT DETECTED Final   Salmonella species NOT DETECTED NOT DETECTED Final   Yersinia enterocolitica NOT DETECTED NOT DETECTED Final   Vibrio species NOT DETECTED NOT DETECTED Final   Vibrio cholerae NOT DETECTED NOT DETECTED Final   Enteroaggregative E coli (EAEC) NOT DETECTED NOT DETECTED Final   Enteropathogenic E coli (EPEC) NOT DETECTED NOT DETECTED Final   Enterotoxigenic E coli (ETEC) NOT DETECTED NOT DETECTED Final   Shiga like toxin producing E coli (STEC) NOT DETECTED NOT DETECTED Final   Shigella/Enteroinvasive E coli (EIEC) NOT DETECTED NOT DETECTED Final   Cryptosporidium NOT DETECTED NOT DETECTED Final    Cyclospora cayetanensis NOT DETECTED NOT DETECTED Final  Entamoeba histolytica NOT DETECTED NOT DETECTED Final   Giardia lamblia NOT DETECTED NOT DETECTED Final   Adenovirus F40/41 NOT DETECTED NOT DETECTED Final   Astrovirus NOT DETECTED NOT DETECTED Final   Norovirus GI/GII NOT DETECTED NOT DETECTED Final   Rotavirus A NOT DETECTED NOT DETECTED Final   Sapovirus (I, II, IV, and V) NOT DETECTED NOT DETECTED Final    Comment: Performed at [facility], [address] [place], [place] [address]  Aerobic/Anaerobic Culture w Gram Stain (surgical/deep wound)     Status: None (Preliminary result)   Collection Time: 05/07/24 10:48 AM   Specimen: Perirectal; Abscess  Result Value Ref Range Status   Specimen Description   Final    PERIRECTAL ABSCESS Performed at [facility], [address], [place], [place] [address]    Special Requests   Final    PERIANUS Performed at [facility], [address], [place], [place] [address]    Gram Stain   Final    RARE WBC PRESENT, PREDOMINANTLY PMN NO ORGANISMS SEEN    Culture   Final    CULTURE REINCUBATED FOR BETTER GROWTH HOLDING FOR POSSIBLE ANAEROBE Performed at [facility], [address] [place], [place] [address]    Report Status PENDING  Incomplete   Time coordinating discharge:  41 mins  SIGNED:  Afton Louder, MD  [facility] 05/09/2024, 1:04 PM How to contact the [facility] Attending or Consulting provider 7A - 7P or covering provider during after hours 7P -7A, for this patient?  Check the care team in [facility] and look for a) attending/consulting [facility] provider listed and b) the [facility] team listed Log into [email or web address] and use [place] universal password to access. If you do not have the password, please contact the hospital operator. Locate the [facility] provider you are looking for under [facility] and page to a number that you can be directly reached. If you still have difficulty reaching the provider,  please page the [facility] (Director on Call) for the Hospitalists listed on amion for assistance.

## 2024-05-09 NOTE — Progress Notes (Signed)
 Physical Therapy Treatment Patient Details Name: Curtis Hartman MRN: [id number] DOB: 1961/09/30 Today's Date: 05/09/2024   History of Present Illness Curtis Hartman is a 63 y.o. male with medical history significant for end-stage renal disease on hemodialysis on Monday Wednesday Fridays, COPD, type 2 diabetes mellitus, GERD, osteoarthritis, hypertension, chronic hepatitis C and peripheral neuropathy, who presented to the emergency room with acute onset of generalized weakness while he was in dialysis with unresponsiveness/syncope after which the patient was mildly confused.  No witnessed seizures.  He stated that he took 30 mg of p.o. morphine  sulfate this morning rather than 15 mg as his dose was recently increased.  He denied any fever or chills.  No chest pain or dyspnea or cough or wheezing.  No dysuria, urinary frequency or urgency, hematuria or flank pain.  He was unable to stand in the ER due to tremors in all 4 extremities.  He had diarrhea couple days ago which has resolved.  He admits to lower abdominal suprapubic pain.  He continues to smoke 5 cigarettes/day.  No paresthesias or focal muscle weakness.  No headache or dizziness or blurred vision.  No tinnitus or vertigo.  No bleeding diathesis.    PT Comments  Patient demonstrates increased endurance/distance for gait training without loss of balance using RW and limited mostly due to fatigue, fair/good return for standing over commode to urinate leaning on RW and tolerated sitting up in chair to eat breakfast after therapy. Patient will benefit from continued skilled physical therapy in hospital and recommended venue below to increase strength, balance, endurance for safe ADLs and gait.     If plan is discharge home, recommend the following: Help with stairs or ramp for entrance;A lot of help with walking and/or transfers;Assistance with cooking/housework;A little help with walking and/or transfers;A little help with bathing/dressing/bathroom    Can travel by private vehicle        Equipment Recommendations  None recommended by PT    Recommendations for Other Services       Precautions / Restrictions Precautions Precautions: Fall Recall of Precautions/Restrictions: Intact Restrictions Weight Bearing Restrictions Per Provider Order: No     Mobility  Bed Mobility Overal bed mobility: Modified Independent             General bed mobility comments: had to use bed rail with HOB flat    Transfers Overall transfer level: Needs assistance Equipment used: Rolling walker (2 wheels) Transfers: Sit to/from Stand, Bed to chair/wheelchair/BSC Sit to Stand: Contact guard assist   Step pivot transfers: Contact guard assist       General transfer comment: slightly labored unsteady movement    Ambulation/Gait Ambulation/Gait assistance: Contact guard assist Gait Distance (Feet): 75 Feet Assistive device: Rolling walker (2 wheels) Gait Pattern/deviations: Decreased step length - right, Decreased step length - left, Decreased stride length, Decreased dorsiflexion - left, Steppage Gait velocity: decreased     General Gait Details: increased endurance/distance for gait training with slightly labored movment, no loss of balance and limited mostly due to c/o fatigue   Stairs             Wheelchair Mobility     Tilt Bed    Modified Rankin (Stroke Patients Only)       Balance Overall balance assessment: Needs assistance Sitting-balance support: Feet supported, No upper extremity supported Sitting balance-Leahy Scale: Good Sitting balance - Comments: seated at EOB   Standing balance support: Bilateral upper extremity supported, During functional activity, Reliant on assistive  device for balance Standing balance-Leahy Scale: Fair Standing balance comment: using RW                            Communication Communication Communication: No apparent difficulties  Cognition Arousal:  Alert Behavior During Therapy: WFL for tasks assessed/performed   PT - Cognitive impairments: No apparent impairments                         Following commands: Intact      Cueing Cueing Techniques: Verbal cues  Exercises      General Comments        Pertinent Vitals/Pain Pain Assessment Pain Assessment: Faces Faces Pain Scale: Hurts a little bit Pain Location: back pain Pain Descriptors / Indicators: Discomfort Pain Intervention(s): Limited activity within patient's tolerance, Monitored during session, Repositioned    Home Living                          Prior Function            PT Goals (current goals can now be found in the care plan section) Acute Rehab PT Goals Patient Stated Goal: return home with family to assist PT Goal Formulation: With patient Time For Goal Achievement: 05/12/24 Potential to Achieve Goals: Good Progress towards PT goals: Progressing toward goals    Frequency    Min 4X/week      PT Plan      Co-evaluation PT/OT/SLP Co-Evaluation/Treatment: Yes Reason for Co-Treatment: To address functional/ADL transfers PT goals addressed during session: Mobility/safety with mobility;Balance;Proper use of DME        AM-PAC PT 6 Clicks Mobility   Outcome Measure  Help needed turning from your back to your side while in a flat bed without using bedrails?: None Help needed moving from lying on your back to sitting on the side of a flat bed without using bedrails?: A Little Help needed moving to and from a bed to a chair (including a wheelchair)?: A Little Help needed standing up from a chair using your arms (e.g., wheelchair or bedside chair)?: A Little Help needed to walk in hospital room?: A Little Help needed climbing 3-5 steps with a railing? : A Lot 6 Click Score: 18    End of Session   Activity Tolerance: Patient tolerated treatment well;Patient limited by fatigue Patient left: in chair;with call bell/phone  within reach Nurse Communication: Mobility status PT Visit Diagnosis: Unsteadiness on feet (R26.81);Other abnormalities of gait and mobility (R26.89);Muscle weakness (generalized) (M62.81)     Time: 9193-9163 PT Time Calculation (min) (ACUTE ONLY): 30 min  Charges:    $Gait Training: 8-22 mins $Therapeutic Activity: 8-22 mins PT General Charges $$ ACUTE PT VISIT: 1 Visit                     1:50 PM, 05/09/24 Lynwood Music, MPT Physical Therapist with [facility] [phone] office [phone] mobile phone

## 2024-05-09 NOTE — TOC Transition Note (Signed)
 Transition of Care [facility]) - Discharge Note   Patient Details  Name: Curtis Hartman MRN: [id number] Date of Birth: 1961-04-09  Transition of [facility]) CM/SW Contact:  Lucie Lunger, LCSWA Phone Number: 05/09/2024, 11:06 AM  Clinical Narrative:    CSW updated that PT worked with pt again this morning and he has improved. Recommendation is now for OP PT services. CSW met with pt at bedside to review OP PT recommendation. Pt states he is agreeable to this referral being made to [facility] OP PT clinic, CSW made referral at this time. Pt states he has transport provided through [facility]. TOC signing off.   Final next level of care: OP Rehab Barriers to Discharge: Barriers Resolved   Patient Goals and CMS Choice Patient states their goals for this hospitalization and ongoing recovery are:: reutrn home CMS Medicare.gov Compare Post Acute Care list provided to:: Patient Choice offered to / list presented to : Patient      Discharge Placement                       Discharge Plan and Services Additional resources added to the After Visit Summary for                                       Social Drivers of Health (SDOH) Interventions SDOH Screenings   Food Insecurity: No Food Insecurity (05/05/2024)  Housing: Low Risk  (05/05/2024)  Transportation Needs: No Transportation Needs (05/05/2024)  Utilities: Not At Risk (05/05/2024)  Financial Resource Strain: High Risk (01/15/2024)   Received from [facility]  Physical Activity: Unknown (01/15/2024)   Received from [facility]  Social Connections: Socially Isolated (05/05/2024)  Stress: Stress Concern Present (02/29/2024)   Received from [facility]  Tobacco Use: High Risk (05/04/2024)     Readmission Risk Interventions    05/09/2024   11:05 AM  Readmission Risk Prevention Plan  Transportation Screening Complete  HRI or Home Care Consult Complete  Social Work Consult for Recovery Care Planning/Counseling Complete  Palliative  Care Screening Not Applicable  Medication Review Oceanographer) Complete

## 2024-05-09 NOTE — Progress Notes (Signed)
 [facility] Progress Note  Name: Curtis Hartman MRN: [id number] DOB: February 23, 1961   Subjective:  No c/o this AM For HD today on schedule About to work with PT   Intake/Output Summary (Last 24 hours) at 05/09/2024 0832 Last data filed at 05/09/2024 0400 Gross per 24 hour  Intake 1300 ml  Output 100 ml  Net 1200 ml    Vitals:  Vitals:   05/08/24 0856 05/08/24 1437 05/08/24 2012 05/09/24 0332  BP: 137/62 137/81 (!) 179/85 119/63  Pulse: 63 65 71 73  Resp:  18 18 18   Temp:  98.1 F (36.7 C) 98.2 F (36.8 C) 98 F (36.7 C)  TempSrc:  Oral Oral Oral  SpO2:  100% 99% 100%  Weight:      Height:         Physical Exam:  NAD, more awake and cogent than when I saw him last week RRR CTAB AVF+B/T No sig LEE   Medications reviewed   Labs:     Latest Ref Rng & Units 05/09/2024    5:04 AM 05/08/2024    3:30 AM 05/07/2024    3:01 AM  BMP  Glucose 70 - 99 mg/dL 898  899  70   BUN 8 - 23 mg/dL 54  36  49   Creatinine 0.61 - 1.24 mg/dL 3.84  5.46  3.69   Sodium 135 - 145 mmol/L 136  134  133   Potassium 3.5 - 5.1 mmol/L 5.0  4.3  4.5   Chloride 98 - 111 mmol/L 96  93  98   CO2 22 - 32 mmol/L 25  27  25    Calcium  8.9 - 10.3 mg/dL 8.9  8.6  8.3      Assessment/Plan:   ESRD MWF: HD back on usual MWF schedule today AMS / Weakness / acute ischemic CVA / medication effect MRI confirmed sev ischemic CVA per TRH/Neuro Gabapentin  and morphine  stopped : Problem #3 also contributing Improving Perianal abscess s/p I&D 05/07/24 per Surgery; currently on zosyn  which is not available outpt at HD Anemia: Hemoglobin stable, no active needs, continue to monitor CKD-BMD: Ca ok, P variable, CTM outpt mgmt Hypertension/volume: carvedilol .  reasonable control, labile. CTM   [name], MD 05/09/2024 8:32 AM

## 2024-05-09 NOTE — Evaluation (Signed)
 Occupational Therapy Evaluation Patient Details Name: Curtis Hartman MRN: [id number] DOB: [birth year] Today's Date: 05/09/2024   History of Present Illness   Curtis Hartman is a 63 y.o. male with medical history significant for end-stage renal disease on hemodialysis on Monday Wednesday Fridays, COPD, type 2 diabetes mellitus, GERD, osteoarthritis, hypertension, chronic hepatitis C and peripheral neuropathy, who presented to the emergency room with acute onset of generalized weakness while he was in dialysis with unresponsiveness/syncope after which the patient was mildly confused.  No witnessed seizures.  He stated that he took 30 mg of p.o. morphine  sulfate this morning rather than 15 mg as his dose was recently increased.  He denied any fever or chills.  No chest pain or dyspnea or cough or wheezing.  No dysuria, urinary frequency or urgency, hematuria or flank pain.  He was unable to stand in the ER due to tremors in all 4 extremities.  He had diarrhea couple days ago which has resolved.  He admits to lower abdominal suprapubic pain.  He continues to smoke 5 cigarettes/day.  No paresthesias or focal muscle weakness.  No headache or dizziness or blurred vision.  No tinnitus or vertigo.  No bleeding diathesis.     Clinical Impressions Pt agreeable to OT and PT co-evaluation/treatment. Pt demonstrates unsteady functional ambulation with need of CGA. Mod I for bed mobility and able to don and doff sock without physical assist. New weakness noted in R UE as well as decreased fine and gross motor skills bilaterally. Pt left in the chair with call bell within reach. Pt reports having enough support at home by family. Pt will benefit from continued OT in the hospital and recommended venue below to increase strength, balance, and endurance for safe ADL's.        If plan is discharge home, recommend the following:   A little help with walking and/or transfers;A little help with  bathing/dressing/bathroom;Assistance with cooking/housework;Assist for transportation;Help with stairs or ramp for entrance     Functional Status Assessment   Patient has had a recent decline in their functional status and demonstrates the ability to make significant improvements in function in a reasonable and predictable amount of time.     Equipment Recommendations   None recommended by OT             Precautions/Restrictions   Precautions Precautions: Fall Recall of Precautions/Restrictions: Intact Restrictions Weight Bearing Restrictions Per Provider Order: No     Mobility Bed Mobility Overal bed mobility: Modified Independent             General bed mobility comments: mild labored movement    Transfers Overall transfer level: Needs assistance Equipment used: Rolling walker (2 wheels) Transfers: Sit to/from Stand, Bed to chair/wheelchair/BSC Sit to Stand: Contact guard assist     Step pivot transfers: Contact guard assist     General transfer comment: unsteady at times      Balance Overall balance assessment: Needs assistance Sitting-balance support: No upper extremity supported, Feet supported Sitting balance-Leahy Scale: Good Sitting balance - Comments: seated at EOB   Standing balance support: Bilateral upper extremity supported, During functional activity, Reliant on assistive device for balance Standing balance-Leahy Scale: Fair Standing balance comment: using RW                           ADL either performed or assessed with clinical judgement   ADL Overall ADL's : Needs assistance/impaired  Grooming: Contact guard assist;Standing;Wash/dry hands       Lower Body Bathing: Set up;Sitting/lateral leans   Upper Body Dressing : Set up;Sitting   Lower Body Dressing: Contact guard assist;Sitting/lateral leans Lower Body Dressing Details (indicate cue type and reason): Able to doff and don sock seated at EOB. Toilet  Transfer: Contact guard assist;Ambulation;Rolling walker (2 wheels) Toilet Transfer Details (indicate cue type and reason): Ambulated to the toilet with RW and CGA. Toileting- Clothing Manipulation and Hygiene: Contact guard assist;Sit to/from stand Toileting - Clothing Manipulation Details (indicate cue type and reason): Pt urinated at the toilet while standing.     Functional mobility during ADLs: Contact guard assist;Rolling walker (2 wheels)       Vision Baseline Vision/History: 1 Wears glasses Ability to See in Adequate Light: 1 Impaired Patient Visual Report: No change from baseline Vision Assessment?: No apparent visual deficits     Perception Perception: Not tested       Praxis Praxis: Not tested       Pertinent Vitals/Pain Pain Assessment Pain Assessment: Faces Faces Pain Scale: Hurts a little bit Pain Location: back pain Pain Descriptors / Indicators: Discomfort Pain Intervention(s): Monitored during session, Repositioned     Extremity/Trunk Assessment Upper Extremity Assessment Upper Extremity Assessment: RUE deficits/detail;LUE deficits/detail RUE Deficits / Details: New weakness. 3+/5 shoudler flexion; 4+/5 elbow flexion; 4-/5 elbow extension; 4+/5 wrist flexion and extension; 4-/5 gross grasp. RUE Sensation: WNL RUE Coordination: decreased fine motor;decreased gross motor LUE Deficits / Details: Limited at baseline for shoulder A/ROM; 2+/5 shoudler flexion; 4+/5 elbow flexion; 4-/5 elbow extension; 4+/5 wrist flexion and extension; 4-/5 gross grasp. LUE Sensation: WNL LUE Coordination: decreased fine motor;decreased gross motor   Lower Extremity Assessment Lower Extremity Assessment: Overall WFL for tasks assessed   Cervical / Trunk Assessment Cervical / Trunk Assessment: Kyphotic   Communication Communication Communication: No apparent difficulties   Cognition Arousal: Alert Behavior During Therapy: WFL for tasks assessed/performed Cognition: No  apparent impairments                               Following commands: Intact       Cueing  General Comments   Cueing Techniques: Verbal cues                 Home Living Family/patient expects to be discharged to:: Private residence Living Arrangements: Other relatives Available Help at Discharge: Family;Available 24 hours/day Type of Home: House Home Access: Stairs to enter [employer] of Steps: 2   Home Layout: One level     Bathroom Shower/Tub: Chief Strategy Officer: Standard Bathroom Accessibility: Yes   Home Equipment: Rollator (4 wheels);BSC/3in1;Adaptive equipment;Shower seat;Tub bench Adaptive Equipment: Reacher        Prior Functioning/Environment Prior Level of Function : Independent/Modified Independent             Mobility Comments: uses rollator outside, and generally no AD in the home as he furniture walks, leans on grocery carts or uses powe scooters in stores (per PT note) ADLs Comments: Independent for household, assisted for some community ADLs, does not drive (per pt and physical therapy note)    OT Problem List: Decreased strength;Decreased range of motion;Decreased activity tolerance;Impaired balance (sitting and/or standing)   OT Treatment/Interventions: Self-care/ADL training;Therapeutic exercise;Neuromuscular education;Therapeutic activities;Patient/family education;Balance training      OT Goals(Current goals can be found in the care plan section)   Acute Rehab OT Goals Patient  Stated Goal: return home OT Goal Formulation: With patient Time For Goal Achievement: 05/23/24 Potential to Achieve Goals: Good   OT Frequency:  Min 2X/week    Co-evaluation PT/OT/SLP Co-Evaluation/Treatment: Yes Reason for Co-Treatment: To address functional/ADL transfers   OT goals addressed during session: ADL's and self-care      AM-PAC OT 6 Clicks Daily Activity     Outcome Measure                  End of Session Equipment Utilized During Treatment: Rolling walker (2 wheels)  Activity Tolerance: Patient tolerated treatment well Patient left: in chair;with call bell/phone within reach  OT Visit Diagnosis: Unsteadiness on feet (R26.81);Other abnormalities of gait and mobility (R26.89);Muscle weakness (generalized) (M62.81);Other symptoms and signs involving the nervous system (R29.898);Hemiplegia and hemiparesis Hemiplegia - Right/Left: Right Hemiplegia - caused by: Cerebral infarction                Time: 0820-0839 OT Time Calculation (min): 19 min Charges:  OT General Charges $OT Visit: 1 Visit OT Evaluation $OT Eval Low Complexity: 1 Low  Gael Delude OT, MOT  Jayson Person 05/09/2024, 9:17 AM

## 2024-05-09 NOTE — Plan of Care (Signed)
  Problem: Acute Rehab OT Goals (only OT should resolve) Goal: Pt. Will Perform Grooming Flowsheets (Taken 05/09/2024 0920) Pt Will Perform Grooming: with modified independence Goal: Pt. Will Perform Lower Body Dressing Flowsheets (Taken 05/09/2024 0920) Pt Will Perform Lower Body Dressing: with modified independence Goal: Pt. Will Transfer To Toilet Flowsheets (Taken 05/09/2024 0920) Pt Will Transfer to Toilet: with modified independence Goal: Pt/Caregiver Will Perform Home Exercise Program Flowsheets (Taken 05/09/2024 0920) Pt/caregiver will Perform Home Exercise Program:  Increased strength  Increased ROM  Independently  Right Upper extremity  Left upper extremity  Varie Machamer OT, MOT

## 2024-05-09 NOTE — Discharge Instructions (Signed)
 Post Operative Instructions after Perianal  -Keep your stools soft and have a BM daily. Take fiber (Metamucil) over the counter daily. Take Colace twice daily if fiber does not keep your stools soft.  Take Miralax  as needed if you still have not had a BM in 2 Days. -Take Sitz Baths (warm water baths) every 4-6 hours for the first week and after every BM and when having discomfort.  -Take Tylenol  and Narcotic pain medication for breakthrough pain. -Some bleeding is normal, but if you have excessive bleeding, fevers, chills, or more pain than prior, call or go to the ED. -Wear a pad for drainage as needed.

## 2024-05-09 NOTE — Progress Notes (Signed)
 EEG complete - results pending

## 2024-05-10 DIAGNOSIS — N186 End stage renal disease: Secondary | ICD-10-CM | POA: Diagnosis not present

## 2024-05-10 DIAGNOSIS — G9341 Metabolic encephalopathy: Secondary | ICD-10-CM | POA: Diagnosis not present

## 2024-05-10 DIAGNOSIS — K610 Anal abscess: Secondary | ICD-10-CM | POA: Diagnosis not present

## 2024-05-10 DIAGNOSIS — I639 Cerebral infarction, unspecified: Secondary | ICD-10-CM | POA: Diagnosis not present

## 2024-05-10 LAB — AEROBIC/ANAEROBIC CULTURE W GRAM STAIN (SURGICAL/DEEP WOUND)

## 2024-05-10 LAB — RENAL FUNCTION PANEL
Albumin: 2.8 g/dL — ABNORMAL LOW (ref 3.5–5.0)
Anion gap: 13 (ref 5–15)
BUN: 75 mg/dL — ABNORMAL HIGH (ref 8–23)
CO2: 23 mmol/L (ref 22–32)
Calcium: 8.7 mg/dL — ABNORMAL LOW (ref 8.9–10.3)
Chloride: 100 mmol/L (ref 98–111)
Creatinine, Ser: 7.03 mg/dL — ABNORMAL HIGH (ref 0.61–1.24)
GFR, Estimated: 8 mL/min — ABNORMAL LOW (ref >60)
Glucose, Bld: 130 mg/dL — ABNORMAL HIGH (ref 70–99)
Phosphorus: 7.1 mg/dL — ABNORMAL HIGH (ref 2.5–4.6)
Potassium: 4.6 mmol/L (ref 3.5–5.1)
Sodium: 136 mmol/L (ref 135–145)

## 2024-05-10 LAB — GLUCOSE, CAPILLARY
Glucose-Capillary: 127 mg/dL — ABNORMAL HIGH (ref 70–99)
Glucose-Capillary: 121 mg/dL — ABNORMAL HIGH (ref 70–99)
Glucose-Capillary: 199 mg/dL — ABNORMAL HIGH (ref 70–99)
Glucose-Capillary: 106 mg/dL — ABNORMAL HIGH (ref 70–99)

## 2024-05-10 MED ORDER — AMOXICILLIN-POT CLAVULANATE 500-125 MG PO TABS
500.0000 mg | ORAL_TABLET | ORAL | Status: DC
  Administered 2024-05-10: 1 via ORAL
  Filled 2024-05-10: qty 1

## 2024-05-10 MED ORDER — AMOXICILLIN-POT CLAVULANATE 500-125 MG PO TABS: 500.0000 mg | ORAL_TABLET | ORAL | 0 refills | Status: DC

## 2024-05-10 NOTE — Progress Notes (Signed)
 Patient transported to [facility] for hemodialysis session via [facility]. Plan is for patient to return to [facility] after treatment.

## 2024-05-10 NOTE — Progress Notes (Signed)
 Physical Therapy Treatment Patient Details Name: Curtis Hartman MRN: [id number] DOB: 29-Jul-1961 Today's Date: 05/10/2024   History of Present Illness Curtis Hartman is a 63 y.o. male with medical history significant for end-stage renal disease on hemodialysis on Monday Wednesday Fridays, COPD, type 2 diabetes mellitus, GERD, osteoarthritis, hypertension, chronic hepatitis C and peripheral neuropathy, who presented to the emergency room with acute onset of generalized weakness while he was in dialysis with unresponsiveness/syncope after which the patient was mildly confused.  No witnessed seizures.  He stated that he took 30 mg of p.o. morphine  sulfate this morning rather than 15 mg as his dose was recently increased.  He denied any fever or chills.  No chest pain or dyspnea or cough or wheezing.  No dysuria, urinary frequency or urgency, hematuria or flank pain.  He was unable to stand in the ER due to tremors in all 4 extremities.  He had diarrhea couple days ago which has resolved.  He admits to lower abdominal suprapubic pain.  He continues to smoke 5 cigarettes/day.  No paresthesias or focal muscle weakness.  No headache or dizziness or blurred vision.  No tinnitus or vertigo.  No bleeding diathesis.    PT Comments  Patient demonstrates good return for completing sit to stands using RW, increased endurance/distance for gait training with less steppage gain on LLE without loss of balance and tolerated sitting up at bedside after therapy. Patient will benefit from continued skilled physical therapy in hospital and recommended venue below to increase strength, balance, endurance for safe ADLs and gait.     If plan is discharge home, recommend the following: Help with stairs or ramp for entrance;A lot of help with walking and/or transfers;Assistance with cooking/housework;A little help with walking and/or transfers;A little help with bathing/dressing/bathroom   Can travel by private vehicle         Equipment Recommendations  None recommended by PT    Recommendations for Other Services       Precautions / Restrictions Precautions Precautions: Fall Recall of Precautions/Restrictions: Intact Restrictions Weight Bearing Restrictions Per Provider Order: No     Mobility  Bed Mobility Overal bed mobility: Modified Independent             General bed mobility comments: good return for bed mobility    Transfers Overall transfer level: Needs assistance Equipment used: Rolling walker (2 wheels) Transfers: Sit to/from Stand, Bed to chair/wheelchair/BSC Sit to Stand: Supervision   Step pivot transfers: Supervision       General transfer comment: good return for completing sit to stands transfers without loss of balance    Ambulation/Gait   Gait Distance (Feet): 100 Feet Assistive device: Rolling walker (2 wheels) Gait Pattern/deviations: Decreased step length - right, Decreased step length - left, Decreased stride length, Decreased dorsiflexion - left Gait velocity: decreased     General Gait Details: increased endurance/distance for gait training with less steppage gait on LLE, no loss of blaance   Stairs             Wheelchair Mobility     Tilt Bed    Modified Rankin (Stroke Patients Only)       Balance Overall balance assessment: Needs assistance Sitting-balance support: Feet supported, No upper extremity supported Sitting balance-Leahy Scale: Good Sitting balance - Comments: seated at EOB   Standing balance support: Bilateral upper extremity supported, During functional activity, Reliant on assistive device for balance Standing balance-Leahy Scale: Fair Standing balance comment: fair/good using RW  Communication Communication Communication: No apparent difficulties  Cognition Arousal: Alert Behavior During Therapy: WFL for tasks assessed/performed   PT - Cognitive impairments: No apparent  impairments                         Following commands: Intact      Cueing Cueing Techniques: Verbal cues  Exercises General Exercises - Lower Extremity Long Arc Quad: Seated, AROM, Strengthening, Both, 10 reps Hip Flexion/Marching: Seated, AROM, Strengthening, Both, 10 reps Toe Raises: Seated, AROM, Strengthening, Right, 10 reps Heel Raises: Seated, AROM, Strengthening, Right, 10 reps    General Comments        Pertinent Vitals/Pain Pain Assessment Pain Assessment: Faces Faces Pain Scale: Hurts a little bit Pain Location: low back discomfort, mostly skin Pain Descriptors / Indicators: Discomfort Pain Intervention(s): Monitored during session    Home Living                          Prior Function            PT Goals (current goals can now be found in the care plan section) Acute Rehab PT Goals Patient Stated Goal: return home with family to assist PT Goal Formulation: With patient Time For Goal Achievement: 05/12/24 Potential to Achieve Goals: Good Progress towards PT goals: Progressing toward goals    Frequency    Min 3X/week      PT Plan      Co-evaluation              AM-PAC PT 6 Clicks Mobility   Outcome Measure  Help needed turning from your back to your side while in a flat bed without using bedrails?: None Help needed moving from lying on your back to sitting on the side of a flat bed without using bedrails?: None Help needed moving to and from a bed to a chair (including a wheelchair)?: A Little Help needed standing up from a chair using your arms (e.g., wheelchair or bedside chair)?: A Little Help needed to walk in hospital room?: A Little Help needed climbing 3-5 steps with a railing? : A Lot 6 Click Score: 19    End of Session   Activity Tolerance: Patient tolerated treatment well;Patient limited by fatigue Patient left: in bed;with call bell/phone within reach Nurse Communication: Mobility status PT Visit  Diagnosis: Unsteadiness on feet (R26.81);Other abnormalities of gait and mobility (R26.89);Muscle weakness (generalized) (M62.81)     Time: 8858-8794 PT Time Calculation (min) (ACUTE ONLY): 24 min  Charges:    $Gait Training: 8-22 mins $Therapeutic Exercise: 8-22 mins PT General Charges $$ ACUTE PT VISIT: 1 Visit                     2:56 PM, 05/10/24 Lynwood Music, MPT Physical Therapist with [facility] 336 [phone] office [phone] mobile phone

## 2024-05-10 NOTE — Progress Notes (Signed)
 [facility] Progress Note  Name: Curtis Hartman MRN: [id number] DOB: [birth year]   Subjective:  No c/o this AM HD delayed to today 2/2 staffing issues   Intake/Output Summary (Last 24 hours) at 05/10/2024 0904 Last data filed at 05/10/2024 0803 Gross per 24 hour  Intake 700 ml  Output --  Net 700 ml    Vitals:  Vitals:   05/09/24 1423 05/09/24 1929 05/10/24 0415 05/10/24 0750  BP: 126/86 139/84 (!) 169/79 (!) 180/90  Pulse: 76 94 71 71  Resp:  18 18 18   Temp: 98 F (36.7 C) 98.1 F (36.7 C) 98 F (36.7 C) (!) 97.5 F (36.4 C)  TempSrc: Oral Oral Oral Oral  SpO2: 100% 100% 98% 100%  Weight:      Height:         Physical Exam:  NAD, more awake and cogent than when I saw him last week RRR CTAB AVF+B/T No sig LEE   Medications reviewed   Labs:     Latest Ref Rng & Units 05/10/2024    4:27 AM 05/09/2024    5:04 AM 05/08/2024    3:30 AM  BMP  Glucose 70 - 99 mg/dL 869  898  899   BUN 8 - 23 mg/dL 75  54  36   Creatinine 0.61 - 1.24 mg/dL 2.96  3.84  5.46   Sodium 135 - 145 mmol/L 136  136  134   Potassium 3.5 - 5.1 mmol/L 4.6  5.0  4.3   Chloride 98 - 111 mmol/L 100  96  93   CO2 22 - 32 mmol/L 23  25  27    Calcium  8.9 - 10.3 mg/dL 8.7  8.9  8.6      Assessment/Plan:   ESRD MWF: HD today and then again tomorrow on usual schedule -- can be outpt tomorrow AMS / Weakness / acute ischemic CVA / medication effect MRI confirmed sev ischemic CVA per TR[facility]/Neuro Gabapentin  and morphine  stopped : Problem #3 also contributing Improving Perianal abscess s/p I&D 05/07/24 per Surgery; to transition to augmentin  Anemia: Hemoglobin stable, no active needs, continue to monitor CKD-BMD: Ca ok, P variable, CTM outpt mgmt Hypertension/volume: carvedilol .  reasonable control, labile. CTM   [name], MD 05/10/2024 9:04 AM

## 2024-05-10 NOTE — Plan of Care (Signed)
  Problem: Clinical Measurements: Goal: Will remain free from infection Outcome: Progressing   Problem: Activity: Goal: Risk for activity intolerance will decrease Outcome: Progressing   Problem: Coping: Goal: Level of anxiety will decrease Outcome: Progressing   Problem: Elimination: Goal: Will not experience complications related to bowel motility Outcome: Progressing Goal: Will not experience complications related to urinary retention Outcome: Progressing   Problem: Pain Managment: Goal: General experience of comfort will improve and/or be controlled Outcome: Progressing   Problem: Safety: Goal: Ability to remain free from injury will improve Outcome: Progressing   Problem: Skin Integrity: Goal: Risk for impaired skin integrity will decrease Outcome: Progressing   Problem: Education: Goal: Knowledge of disease or condition will improve Outcome: Progressing Goal: Knowledge of secondary prevention will improve (MUST DOCUMENT ALL) Outcome: Progressing Goal: Knowledge of patient specific risk factors will improve (DELETE if not current risk factor) Outcome: Progressing

## 2024-05-10 NOTE — Progress Notes (Signed)
 BP (!) 204/98 (BP Location: Right Arm)   Pulse 82   Temp 98.1 F (36.7 C) (Oral)   Resp 18   Ht 5' 5 (1.651 m)   Wt 59.5 kg   SpO2 100%   BMI 21.83 kg/m   Elevated bp. Prn Amlodipine  given per orders. Will increase VS frequency. Pt Irritable

## 2024-05-10 NOTE — TOC Progression Note (Signed)
 Transition of Care [facility]) - Progression Note    Patient Details  Name: Curtis Hartman MRN: [id number] Date of Birth: 05/31/1961  [facility]) CM/SW Contact  Lucie Lunger, [place] Phone Number: 05/10/2024, 3:48 PM  Clinical Narrative:    CSW updated that pts sister left note about pt needing higher level of care prior to returning home as she cannot assist in pts care. CSW also updated by PT that pt worked well with them again today and ambulated independently. PT spoke with pt and he states he is able to care for himself and do his ADLs as needed. CSW attempted to speak with pt but he was off the floor at [facility] for HD treatment. TOC to follow.     Barriers to Discharge: Barriers Resolved  Expected Discharge Plan and Services         Expected Discharge Date: 05/09/24                                     Social Determinants of Health (SDOH) Interventions SDOH Screenings   Food Insecurity: No Food Insecurity (05/05/2024)  Housing: Low Risk  (05/05/2024)  Transportation Needs: No Transportation Needs (05/05/2024)  Utilities: Not At Risk (05/05/2024)  Financial Resource Strain: High Risk (01/15/2024)   Received from [facility]  Physical Activity: Unknown (01/15/2024)   Received from [facility]  Social Connections: Socially Isolated (05/05/2024)  Stress: Stress Concern Present (02/29/2024)   Received from [facility]  Tobacco Use: High Risk (05/04/2024)    Readmission Risk Interventions    05/09/2024   11:05 AM  Readmission Risk Prevention Plan  Transportation Screening Complete  HRI or Home Care Consult Complete  Social Work Consult for Recovery Care Planning/Counseling Complete  Palliative Care Screening Not Applicable  Medication Review Ocean[employer]) Complete

## 2024-05-10 NOTE — Progress Notes (Signed)
 This pts sister left a note for the attending day shift dr concerning her brother being discharged home today. Pt lives with sister and her boyfriend. She feels he is unable to care for himself,  and a 'facility' or 'rehab' would be best, or at least allow time for her to clean the space where he inhabits. Pts sister referenced pt is weak with terrible balance, she is unable to hold or pick him up when he falls. This nurse is forwarding this detailed note to day shift nurse as well as making notation in pts chart for the dr. Soyla place note in pts chart as well.

## 2024-05-10 NOTE — Progress Notes (Signed)
 05/10/2024 2:27 PM  PROGRESS NOTE   05/10/2024 2:27 PM  [facility] was seen and examined.  His discharge was held yesterday due to inability for us  to provide HD due to no HD nurse available.  Patient is being roundtrip transfer to [facility] today for hemodialysis treatment at [facility].  Also of note, notified by [facility] social worker that sister left on letter explaining that she was not able to take patient back into her home.  TOC is working on arrangements for safe discharge.  Please see dictated discharge summary dated 05/09/2024.  The patient is stable, doing well, just needs dialysis and safe discharge plan.   Vitals:   05/10/24 0415 05/10/24 0750  BP: (!) 169/79 (!) 180/90  Pulse: 71 71  Resp: 18 18  Temp: 98 F (36.7 C) (!) 97.5 F (36.4 C)  SpO2: 98% 100%    Results for orders placed or performed during the hospital encounter of 05/04/24  Basic metabolic panel   Collection Time: 05/04/24  4:06 PM  Result Value Ref Range   Sodium 143 135 - 145 mmol/L   Potassium 4.4 3.5 - 5.1 mmol/L   Chloride 107 98 - 111 mmol/L   CO2 23 22 - 32 mmol/L   Glucose, Bld 127 (H) 70 - 99 mg/dL   BUN 52 (H) 8 - 23 mg/dL   Creatinine, Ser 3.75 (H) 0.61 - 1.24 mg/dL   Calcium  8.6 (L) 8.9 - 10.3 mg/dL   GFR, Estimated 9 (L) >60 mL/min   Anion gap 13 5 - 15  CBC with Differential   Collection Time: 05/04/24  4:06 PM  Result Value Ref Range   WBC 11.2 (H) 4.0 - 10.5 K/uL   RBC 4.01 (L) 4.22 - 5.81 MIL/uL   Hemoglobin 12.1 (L) 13.0 - 17.0 g/dL   HCT 62.3 (L) 60.9 - 47.9 %   MCV 93.8 80.0 - 100.0 fL   MCH 30.2 26.0 - 34.0 pg   MCHC 32.2 30.0 - 36.0 g/dL   RDW 84.9 88.4 - 84.4 %   Platelets 264 150 - 400 K/uL   nRBC 0.0 0.0 - 0.2 %   Neutrophils Relative % 83 %   Neutro Abs 9.4 (H) 1.7 - 7.7 K/uL   Lymphocytes Relative 7 %   Lymphs Abs 0.7 0.7 - 4.0 K/uL   Monocytes Relative 8 %   Monocytes Absolute 0.9 0.1 - 1.0 K/uL   Eosinophils Relative 1 %   Eosinophils Absolute 0.2 0.0 - 0.5  K/uL   Basophils Relative 0 %   Basophils Absolute 0.1 0.0 - 0.1 K/uL   Immature Granulocytes 1 %   Abs Immature Granulocytes 0.06 0.00 - 0.07 K/uL  Hepatic function panel   Collection Time: 05/04/24  4:06 PM  Result Value Ref Range   Total Protein 6.6 6.5 - 8.1 g/dL   Albumin  3.2 (L) 3.5 - 5.0 g/dL   AST 14 (L) 15 - 41 U/L   ALT 9 0 - 44 U/L   Alkaline Phosphatase 52 38 - 126 U/L   Total Bilirubin 0.6 0.0 - 1.2 mg/dL   Bilirubin, Direct <9.8 0.0 - 0.2 mg/dL   Indirect Bilirubin NOT CALCULATED 0.3 - 0.9 mg/dL  CBG monitoring, ED   Collection Time: 05/04/24  4:19 PM  Result Value Ref Range   Glucose-Capillary 135 (H) 70 - 99 mg/dL  Gastrointestinal Panel by PCR , Stool   Collection Time: 05/04/24  4:39 PM   Specimen: STOOL  Result  Value Ref Range   Campylobacter species NOT DETECTED NOT DETECTED   Plesimonas shigelloides NOT DETECTED NOT DETECTED   Salmonella species NOT DETECTED NOT DETECTED   Yersinia enterocolitica NOT DETECTED NOT DETECTED   Vibrio species NOT DETECTED NOT DETECTED   Vibrio cholerae NOT DETECTED NOT DETECTED   Enteroaggregative E coli (EAEC) NOT DETECTED NOT DETECTED   Enteropathogenic E coli (EPEC) NOT DETECTED NOT DETECTED   Enterotoxigenic E coli (ETEC) NOT DETECTED NOT DETECTED   Shiga like toxin producing E coli (STEC) NOT DETECTED NOT DETECTED   Shigella/Enteroinvasive E coli (EIEC) NOT DETECTED NOT DETECTED   Cryptosporidium NOT DETECTED NOT DETECTED   Cyclospora cayetanensis NOT DETECTED NOT DETECTED   Entamoeba histolytica NOT DETECTED NOT DETECTED   Giardia lamblia NOT DETECTED NOT DETECTED   Adenovirus F40/41 NOT DETECTED NOT DETECTED   Astrovirus NOT DETECTED NOT DETECTED   Norovirus GI/GII NOT DETECTED NOT DETECTED   Rotavirus A NOT DETECTED NOT DETECTED   Sapovirus (I, II, IV, and V) NOT DETECTED NOT DETECTED  Occult blood, poc device   Collection Time: 05/04/24  4:52 PM  Result Value Ref Range   Fecal Occult Bld NEGATIVE NEGATIVE  CBG  monitoring, ED   Collection Time: 05/04/24 11:45 PM  Result Value Ref Range   Glucose-Capillary 84 70 - 99 mg/dL  HIV Antibody (routine testing w rflx)   Collection Time: 05/05/24  4:33 AM  Result Value Ref Range   HIV Screen 4th Generation wRfx Non Reactive Non Reactive  Basic metabolic panel   Collection Time: 05/05/24  4:33 AM  Result Value Ref Range   Sodium 143 135 - 145 mmol/L   Potassium 5.0 3.5 - 5.1 mmol/L   Chloride 108 98 - 111 mmol/L   CO2 23 22 - 32 mmol/L   Glucose, Bld 101 (H) 70 - 99 mg/dL   BUN 55 (H) 8 - 23 mg/dL   Creatinine, Ser 2.99 (H) 0.61 - 1.24 mg/dL   Calcium  8.3 (L) 8.9 - 10.3 mg/dL   GFR, Estimated 8 (L) >60 mL/min   Anion gap 12 5 - 15  CBC   Collection Time: 05/05/24  4:33 AM  Result Value Ref Range   WBC 10.6 (H) 4.0 - 10.5 K/uL   RBC 3.73 (L) 4.22 - 5.81 MIL/uL   Hemoglobin 11.3 (L) 13.0 - 17.0 g/dL   HCT 64.8 (L) 60.9 - 47.9 %   MCV 94.1 80.0 - 100.0 fL   MCH 30.3 26.0 - 34.0 pg   MCHC 32.2 30.0 - 36.0 g/dL   RDW 85.0 88.4 - 84.4 %   Platelets 286 150 - 400 K/uL   nRBC 0.0 0.0 - 0.2 %  ECHOCARDIOGRAM COMPLETE   Collection Time: 05/05/24  2:52 PM  Result Value Ref Range   Weight 2,215.18 oz   BP 133/70 mmHg   Area-P 1/2 4.29 cm2   S' Lateral 2.90 cm   Est EF 65 - 70%   Hepatitis B surface antigen   Collection Time: 05/06/24  2:36 AM  Result Value Ref Range   Hepatitis B Surface Ag NON REACTIVE NON REACTIVE  Hepatitis B surface antibody,quantitative   Collection Time: 05/06/24  2:36 AM  Result Value Ref Range   Hep B S AB Quant (Post) <3.5 (L) Immunity>10 mIU/mL  Glucose, capillary   Collection Time: 05/06/24  2:57 AM  Result Value Ref Range   Glucose-Capillary 121 (H) 70 - 99 mg/dL  Renal function panel   Collection Time: 05/06/24 11:37  AM  Result Value Ref Range   Sodium 134 (L) 135 - 145 mmol/L   Potassium 4.6 3.5 - 5.1 mmol/L   Chloride 97 (L) 98 - 111 mmol/L   CO2 27 22 - 32 mmol/L   Glucose, Bld 95 70 - 99 mg/dL   BUN  36 (H) 8 - 23 mg/dL   Creatinine, Ser 4.83 (H) 0.61 - 1.24 mg/dL   Calcium  8.4 (L) 8.9 - 10.3 mg/dL   Phosphorus 5.5 (H) 2.5 - 4.6 mg/dL   Albumin  2.7 (L) 3.5 - 5.0 g/dL   GFR, Estimated 12 (L) >60 mL/min   Anion gap 10 5 - 15  Hemoglobin A1c   Collection Time: 05/06/24 11:37 AM  Result Value Ref Range   Hgb A1c MFr Bld 4.9 4.8 - 5.6 %   Mean Plasma Glucose 93.93 mg/dL  Lipid panel   Collection Time: 05/06/24 11:37 AM  Result Value Ref Range   Cholesterol 123 0 - 200 mg/dL   Triglycerides 71 <849 mg/dL   HDL 42 >59 mg/dL   Total CHOL/HDL Ratio 2.9 RATIO   VLDL 14 0 - 40 mg/dL   LDL Cholesterol 67 0 - 99 mg/dL  Ammonia   Collection Time: 05/06/24 12:11 PM  Result Value Ref Range   Ammonia 14 9 - 35 umol/L  Vitamin B12   Collection Time: 05/06/24 12:11 PM  Result Value Ref Range   Vitamin B-12 361 180 - 914 pg/mL  Folate   Collection Time: 05/06/24 12:11 PM  Result Value Ref Range   Folate 6.3 >5.9 ng/mL  TSH   Collection Time: 05/06/24 12:11 PM  Result Value Ref Range   TSH 1.038 0.350 - 4.500 uIU/mL  RPR   Collection Time: 05/06/24 12:11 PM  Result Value Ref Range   RPR Ser Ql NON REACTIVE NON REACTIVE  Glucose, capillary   Collection Time: 05/06/24  4:08 PM  Result Value Ref Range   Glucose-Capillary 104 (H) 70 - 99 mg/dL  Glucose, capillary   Collection Time: 05/06/24  9:21 PM  Result Value Ref Range   Glucose-Capillary 93 70 - 99 mg/dL  Glucose, capillary   Collection Time: 05/07/24 12:40 AM  Result Value Ref Range   Glucose-Capillary 145 (H) 70 - 99 mg/dL  Renal function panel   Collection Time: 05/07/24  3:01 AM  Result Value Ref Range   Sodium 133 (L) 135 - 145 mmol/L   Potassium 4.5 3.5 - 5.1 mmol/L   Chloride 98 98 - 111 mmol/L   CO2 25 22 - 32 mmol/L   Glucose, Bld 70 70 - 99 mg/dL   BUN 49 (H) 8 - 23 mg/dL   Creatinine, Ser 3.69 (H) 0.61 - 1.24 mg/dL   Calcium  8.3 (L) 8.9 - 10.3 mg/dL   Phosphorus 6.4 (H) 2.5 - 4.6 mg/dL   Albumin  2.7 (L) 3.5  - 5.0 g/dL   GFR, Estimated 9 (L) >60 mL/min   Anion gap 10 5 - 15  CBC   Collection Time: 05/07/24  3:01 AM  Result Value Ref Range   WBC 14.5 (H) 4.0 - 10.5 K/uL   RBC 3.58 (L) 4.22 - 5.81 MIL/uL   Hemoglobin 10.4 (L) 13.0 - 17.0 g/dL   HCT 66.2 (L) 60.9 - 47.9 %   MCV 94.1 80.0 - 100.0 fL   MCH 29.1 26.0 - 34.0 pg   MCHC 30.9 30.0 - 36.0 g/dL   RDW 85.5 88.4 - 84.4 %   Platelets 281 150 - 400  K/uL   nRBC 0.0 0.0 - 0.2 %  Glucose, capillary   Collection Time: 05/07/24  4:12 AM  Result Value Ref Range   Glucose-Capillary 90 70 - 99 mg/dL  Glucose, capillary   Collection Time: 05/07/24  7:49 AM  Result Value Ref Range   Glucose-Capillary 85 70 - 99 mg/dL  Aerobic/Anaerobic Culture w Gram Stain (surgical/deep wound)   Collection Time: 05/07/24 10:48 AM   Specimen: Perirectal; Abscess  Result Value Ref Range   Specimen Description      PERIRECTAL ABSCESS Performed at [facility], [address] [place], [place] [address]    Special Requests      PERIANUS Performed at [facility], [address], [place], [place] [address]    Gram Stain      RARE WBC PRESENT, PREDOMINANTLY PMN NO ORGANISMS SEEN    Culture      RARE MULTIPLE ORGANISMS PRESENT, NONE PREDOMINANT NO GROUP A STREP (S.PYOGENES) ISOLATED NO STAPHYLOCOCCUS AUREUS ISOLATED MODERATE BACTEROIDES THETAIOTAOMICRON BETA LACTAMASE POSITIVE Performed at [facility], [address], [place], [place] [address]    Report Status 05/10/2024 FINAL   Glucose, capillary   Collection Time: 05/07/24 11:40 AM  Result Value Ref Range   Glucose-Capillary 91 70 - 99 mg/dL  Glucose, capillary   Collection Time: 05/07/24  3:55 PM  Result Value Ref Range   Glucose-Capillary 133 (H) 70 - 99 mg/dL  Glucose, capillary   Collection Time: 05/07/24  8:56 PM  Result Value Ref Range   Glucose-Capillary 229 (H) 70 - 99 mg/dL   Comment 1 Notify RN    Comment 2 Document in Chart   Glucose, capillary   Collection Time:  05/08/24 12:50 AM  Result Value Ref Range   Glucose-Capillary 138 (H) 70 - 99 mg/dL  Renal function panel   Collection Time: 05/08/24  3:30 AM  Result Value Ref Range   Sodium 134 (L) 135 - 145 mmol/L   Potassium 4.3 3.5 - 5.1 mmol/L   Chloride 93 (L) 98 - 111 mmol/L   CO2 27 22 - 32 mmol/L   Glucose, Bld 100 (H) 70 - 99 mg/dL   BUN 36 (H) 8 - 23 mg/dL   Creatinine, Ser 5.46 (H) 0.61 - 1.24 mg/dL   Calcium  8.6 (L) 8.9 - 10.3 mg/dL   Phosphorus 5.0 (H) 2.5 - 4.6 mg/dL   Albumin  2.6 (L) 3.5 - 5.0 g/dL   GFR, Estimated 14 (L) >60 mL/min   Anion gap 14 5 - 15  CBC with Differential/Platelet   Collection Time: 05/08/24  3:30 AM  Result Value Ref Range   WBC 10.0 4.0 - 10.5 K/uL   RBC 3.77 (L) 4.22 - 5.81 MIL/uL   Hemoglobin 10.9 (L) 13.0 - 17.0 g/dL   HCT 65.0 (L) 60.9 - 47.9 %   MCV 92.6 80.0 - 100.0 fL   MCH 28.9 26.0 - 34.0 pg   MCHC 31.2 30.0 - 36.0 g/dL   RDW 85.7 88.4 - 84.4 %   Platelets 318 150 - 400 K/uL   nRBC 0.0 0.0 - 0.2 %   Neutrophils Relative % 74 %   Neutro Abs 7.5 1.7 - 7.7 K/uL   Lymphocytes Relative 13 %   Lymphs Abs 1.3 0.7 - 4.0 K/uL   Monocytes Relative 9 %   Monocytes Absolute 0.9 0.1 - 1.0 K/uL   Eosinophils Relative 2 %   Eosinophils Absolute 0.2 0.0 - 0.5 K/uL   Basophils Relative 1 %  Basophils Absolute 0.1 0.0 - 0.1 K/uL   Immature Granulocytes 1 %   Abs Immature Granulocytes 0.05 0.00 - 0.07 K/uL  Glucose, capillary   Collection Time: 05/08/24  4:10 AM  Result Value Ref Range   Glucose-Capillary 107 (H) 70 - 99 mg/dL  Glucose, capillary   Collection Time: 05/08/24  7:46 AM  Result Value Ref Range   Glucose-Capillary 99 70 - 99 mg/dL  Glucose, capillary   Collection Time: 05/08/24 11:41 AM  Result Value Ref Range   Glucose-Capillary 134 (H) 70 - 99 mg/dL  Glucose, capillary   Collection Time: 05/08/24  3:47 PM  Result Value Ref Range   Glucose-Capillary 144 (H) 70 - 99 mg/dL  Glucose, capillary   Collection Time: 05/08/24  8:10 PM   Result Value Ref Range   Glucose-Capillary 212 (H) 70 - 99 mg/dL  Glucose, capillary   Collection Time: 05/08/24 11:53 PM  Result Value Ref Range   Glucose-Capillary 81 70 - 99 mg/dL  Glucose, capillary   Collection Time: 05/09/24  3:34 AM  Result Value Ref Range   Glucose-Capillary 88 70 - 99 mg/dL  Renal function panel   Collection Time: 05/09/24  5:04 AM  Result Value Ref Range   Sodium 136 135 - 145 mmol/L   Potassium 5.0 3.5 - 5.1 mmol/L   Chloride 96 (L) 98 - 111 mmol/L   CO2 25 22 - 32 mmol/L   Glucose, Bld 101 (H) 70 - 99 mg/dL   BUN 54 (H) 8 - 23 mg/dL   Creatinine, Ser 3.84 (H) 0.61 - 1.24 mg/dL   Calcium  8.9 8.9 - 10.3 mg/dL   Phosphorus 6.7 (H) 2.5 - 4.6 mg/dL   Albumin  2.8 (L) 3.5 - 5.0 g/dL   GFR, Estimated 10 (L) >60 mL/min   Anion gap 15 5 - 15  CBC with Differential/Platelet   Collection Time: 05/09/24  5:04 AM  Result Value Ref Range   WBC 8.9 4.0 - 10.5 K/uL   RBC 3.99 (L) 4.22 - 5.81 MIL/uL   Hemoglobin 11.5 (L) 13.0 - 17.0 g/dL   HCT 63.7 (L) 60.9 - 47.9 %   MCV 90.7 80.0 - 100.0 fL   MCH 28.8 26.0 - 34.0 pg   MCHC 31.8 30.0 - 36.0 g/dL   RDW 86.0 88.4 - 84.4 %   Platelets 352 150 - 400 K/uL   nRBC 0.0 0.0 - 0.2 %   Neutrophils Relative % 68 %   Neutro Abs 6.1 1.7 - 7.7 K/uL   Lymphocytes Relative 16 %   Lymphs Abs 1.4 0.7 - 4.0 K/uL   Monocytes Relative 11 %   Monocytes Absolute 1.0 0.1 - 1.0 K/uL   Eosinophils Relative 3 %   Eosinophils Absolute 0.2 0.0 - 0.5 K/uL   Basophils Relative 1 %   Basophils Absolute 0.1 0.0 - 0.1 K/uL   Immature Granulocytes 1 %   Abs Immature Granulocytes 0.05 0.00 - 0.07 K/uL  Glucose, capillary   Collection Time: 05/09/24  7:32 AM  Result Value Ref Range   Glucose-Capillary 109 (H) 70 - 99 mg/dL   Comment 1 Document in Chart   Glucose, capillary   Collection Time: 05/09/24 11:39 AM  Result Value Ref Range   Glucose-Capillary 252 (H) 70 - 99 mg/dL   Comment 1 Document in Chart   Glucose, capillary    Collection Time: 05/09/24  4:29 PM  Result Value Ref Range   Glucose-Capillary 72 70 - 99 mg/dL   Comment  1 Document in Chart   Glucose, capillary   Collection Time: 05/09/24  7:28 PM  Result Value Ref Range   Glucose-Capillary 177 (H) 70 - 99 mg/dL  Glucose, capillary   Collection Time: 05/10/24  3:48 AM  Result Value Ref Range   Glucose-Capillary 127 (H) 70 - 99 mg/dL  Renal function panel   Collection Time: 05/10/24  4:27 AM  Result Value Ref Range   Sodium 136 135 - 145 mmol/L   Potassium 4.6 3.5 - 5.1 mmol/L   Chloride 100 98 - 111 mmol/L   CO2 23 22 - 32 mmol/L   Glucose, Bld 130 (H) 70 - 99 mg/dL   BUN 75 (H) 8 - 23 mg/dL   Creatinine, Ser 2.96 (H) 0.61 - 1.24 mg/dL   Calcium  8.7 (L) 8.9 - 10.3 mg/dL   Phosphorus 7.1 (H) 2.5 - 4.6 mg/dL   Albumin  2.8 (L) 3.5 - 5.0 g/dL   GFR, Estimated 8 (L) >60 mL/min   Anion gap 13 5 - 15  Glucose, capillary   Collection Time: 05/10/24  7:32 AM  Result Value Ref Range   Glucose-Capillary 121 (H) 70 - 99 mg/dL   Comment 1 Document in Chart   Glucose, capillary   Collection Time: 05/10/24 11:37 AM  Result Value Ref Range   Glucose-Capillary 199 (H) 70 - 99 mg/dL   Comment 1 Document in Chart    Exam:  General exam: Appears calm and comfortable  Respiratory system: Clear to auscultation. Respiratory effort normal. Cardiovascular system: normal S1 & S2 heard. No JVD, murmurs, rubs, gallops or clicks. No pedal edema. Gastrointestinal system: Abdomen is nondistended, soft and nontender. No organomegaly or masses felt. Normal bowel sounds heard. Central nervous system: Alert and oriented to person, place. No focal neurological deficits. Extremities: Symmetric 5 x 5 power. Skin: No rashes, lesions or ulcers. Psychiatry: Judgement and insight appear diminished. Mood & affect appropriate.   KYM Louder, MD [facility]   05/04/2024  3:55 PM How to contact the [facility] Attending or Consulting provider 7A - 7P or covering provider  during after hours 7P -7A, for this patient?  Check the care team in [facility] and look for a) attending/consulting TRH provider listed and b) the TRH team listed Log into [email or web address] and use [place]'s universal password to access. If you do not have the password, please contact the hospital operator. Locate the TRH provider you are looking for under [facility] and page to a number that you can be directly reached. If you still have difficulty reaching the provider, please page the [facility] (Director on Call) for the Hospitalists listed on amion for assistance.

## 2024-05-10 NOTE — Progress Notes (Signed)
   05/10/24 1810  Vitals  Temp 98 F (36.7 C)  Pulse Rate 74  Resp 16  BP (!) 196/104  SpO2 100 %  O2 Device Room Air  Post Treatment  Dialyzer Clearance Clear  Hemodialysis Intake (mL) 0 mL  Liters Processed 61.4  Fluid Removed (mL) 1500 mL  Tolerated HD Treatment Yes  AVG/AVF Arterial Site Held (minutes) 10 minutes  AVG/AVF Venous Site Held (minutes) 10 minutes   Received patient in bed to unit.  Alert and oriented.  Informed consent signed and in chart.   TX duration:3HRS  Patient tolerated well.  Transported back to the room  Alert, without acute distress.  Hand-off given to patient's nurse.   Access used: lavg Access issues: NONE  Total UF removed: 1.5L Medication(s) given: TYLENOL    [facility]

## 2024-05-11 ENCOUNTER — Telehealth: Payer: Self-pay | Admitting: *Deleted

## 2024-05-11 DIAGNOSIS — Z992 Dependence on renal dialysis: Secondary | ICD-10-CM | POA: Diagnosis not present

## 2024-05-11 DIAGNOSIS — I639 Cerebral infarction, unspecified: Secondary | ICD-10-CM | POA: Diagnosis not present

## 2024-05-11 DIAGNOSIS — N186 End stage renal disease: Secondary | ICD-10-CM | POA: Diagnosis not present

## 2024-05-11 DIAGNOSIS — K610 Anal abscess: Secondary | ICD-10-CM | POA: Diagnosis not present

## 2024-05-11 LAB — RENAL FUNCTION PANEL
Albumin: 3.1 g/dL — ABNORMAL LOW (ref 3.5–5.0)
Anion gap: 15 (ref 5–15)
BUN: 43 mg/dL — ABNORMAL HIGH (ref 8–23)
CO2: 26 mmol/L (ref 22–32)
Calcium: 9.1 mg/dL (ref 8.9–10.3)
Chloride: 96 mmol/L — ABNORMAL LOW (ref 98–111)
Creatinine, Ser: 4.73 mg/dL — ABNORMAL HIGH (ref 0.61–1.24)
GFR, Estimated: 13 mL/min — ABNORMAL LOW (ref >60)
Glucose, Bld: 100 mg/dL — ABNORMAL HIGH (ref 70–99)
Phosphorus: 5.5 mg/dL — ABNORMAL HIGH (ref 2.5–4.6)
Potassium: 4 mmol/L (ref 3.5–5.1)
Sodium: 137 mmol/L (ref 135–145)

## 2024-05-11 LAB — GLUCOSE, CAPILLARY
Glucose-Capillary: 117 mg/dL — ABNORMAL HIGH (ref 70–99)
Glucose-Capillary: 107 mg/dL — ABNORMAL HIGH (ref 70–99)
Glucose-Capillary: 171 mg/dL — ABNORMAL HIGH (ref 70–99)
Glucose-Capillary: 178 mg/dL — ABNORMAL HIGH (ref 70–99)

## 2024-05-11 MED ORDER — CARVEDILOL 6.25 MG PO TABS: 6.2500 mg | ORAL_TABLET | Freq: Two times a day (BID) | ORAL | 1 refills | Status: DC

## 2024-05-11 MED ORDER — AMOXICILLIN-POT CLAVULANATE 500-125 MG PO TABS: 500.0000 mg | ORAL_TABLET | ORAL | 0 refills | Status: DC

## 2024-05-11 MED ORDER — CARVEDILOL 3.125 MG PO TABS: 6.2500 mg | ORAL_TABLET | Freq: Two times a day (BID) | ORAL | Status: DC

## 2024-05-11 MED ORDER — ORAL CARE MOUTH RINSE
15.0000 mL | OROMUCOSAL | Status: DC | PRN
Start: 2024-05-11 — End: 2024-05-11

## 2024-05-11 NOTE — Plan of Care (Signed)
 Problem: Education: Goal: Knowledge of General Education information will improve Description: Including pain rating scale, medication(s)/side effects and non-pharmacologic comfort measures Outcome: Progressing   Problem: Health Behavior/Discharge Planning: Goal: Ability to manage health-related needs will improve Outcome: Progressing   Problem: Clinical Measurements: Goal: Ability to maintain clinical measurements within normal limits will improve Outcome: Progressing Goal: Will remain free from infection Outcome: Progressing Goal: Diagnostic test results will improve Outcome: Progressing Goal: Respiratory complications will improve Outcome: Progressing Goal: Cardiovascular complication will be avoided Outcome: Progressing   Problem: Activity: Goal: Risk for activity intolerance will decrease Outcome: Progressing   Problem: Nutrition: Goal: Adequate nutrition will be maintained Outcome: Progressing   Problem: Coping: Goal: Level of anxiety will decrease Outcome: Progressing   Problem: Elimination: Goal: Will not experience complications related to bowel motility Outcome: Progressing Goal: Will not experience complications related to urinary retention Outcome: Progressing   Problem: Pain Managment: Goal: General experience of comfort will improve and/or be controlled Outcome: Progressing   Problem: Safety: Goal: Ability to remain free from injury will improve Outcome: Progressing   Problem: Skin Integrity: Goal: Risk for impaired skin integrity will decrease Outcome: Progressing   Problem: Education: Goal: Knowledge of disease or condition will improve Outcome: Progressing Goal: Knowledge of secondary prevention will improve (MUST DOCUMENT ALL) Outcome: Progressing Goal: Knowledge of patient specific risk factors will improve (DELETE if not current risk factor) Outcome: Progressing   Problem: Ischemic Stroke/TIA Tissue Perfusion: Goal: Complications of  ischemic stroke/TIA will be minimized Outcome: Progressing   Problem: Coping: Goal: Will verbalize positive feelings about self Outcome: Progressing Goal: Will identify appropriate support needs Outcome: Progressing   Problem: Health Behavior/Discharge Planning: Goal: Ability to manage health-related needs will improve Outcome: Progressing Goal: Goals will be collaboratively established with patient/family Outcome: Progressing   Problem: Self-Care: Goal: Ability to participate in self-care as condition permits will improve Outcome: Progressing Goal: Verbalization of feelings and concerns over difficulty with self-care will improve Outcome: Progressing Goal: Ability to communicate needs accurately will improve Outcome: Progressing   Problem: Nutrition: Goal: Risk of aspiration will decrease Outcome: Progressing Goal: Dietary intake will improve Outcome: Progressing   Problem: Education: Goal: Ability to describe self-care measures that may prevent or decrease complications (Diabetes Survival Skills Education) will improve Outcome: Progressing Goal: Individualized Educational Video(s) Outcome: Progressing   Problem: Coping: Goal: Ability to adjust to condition or change in health will improve Outcome: Progressing   Problem: Fluid Volume: Goal: Ability to maintain a balanced intake and output will improve Outcome: Progressing   Problem: Health Behavior/Discharge Planning: Goal: Ability to identify and utilize available resources and services will improve Outcome: Progressing Goal: Ability to manage health-related needs will improve Outcome: Progressing   Problem: Metabolic: Goal: Ability to maintain appropriate glucose levels will improve Outcome: Progressing   Problem: Education: Goal: Knowledge of disease or condition will improve Outcome: Progressing Goal: Knowledge of secondary prevention will improve (MUST DOCUMENT ALL) Outcome: Progressing Goal: Knowledge  of patient specific risk factors will improve (DELETE if not current risk factor) Outcome: Progressing   Problem: Ischemic Stroke/TIA Tissue Perfusion: Goal: Complications of ischemic stroke/TIA will be minimized Outcome: Progressing   Problem: Coping: Goal: Will verbalize positive feelings about self Outcome: Progressing Goal: Will identify appropriate support needs Outcome: Progressing   Problem: Health Behavior/Discharge Planning: Goal: Ability to manage health-related needs will improve Outcome: Progressing Goal: Goals will be collaboratively established with patient/family Outcome: Progressing   Problem: Self-Care: Goal: Ability to participate in self-care as condition permits will improve  Outcome: Progressing Goal: Verbalization of feelings and concerns over difficulty with self-care will improve Outcome: Progressing Goal: Ability to communicate needs accurately will improve Outcome: Progressing   Problem: Nutrition: Goal: Risk of aspiration will decrease Outcome: Progressing Goal: Dietary intake will improve Outcome: Progressing

## 2024-05-11 NOTE — Telephone Encounter (Signed)
-----   Message from [facility] sent at 05/07/2024  5:37 PM EDT ----- Regarding: 30 day cardiac monitor request May 07, 2024  Dear Reynaldo, I am writing to request a 30-day cardiac monitor for this patient that is being worked up for acute stroke in the hospital as part of the neurologist recommendations.  He will likely be discharged from the hospital in the next 48 hours.  He likely will discharge to skilled rehab at his monitor can be mailed to him.  Thank you for your assistance.  Sincerely,   KYM Louder, MD How to contact the [facility] Attending or Consulting provider 7A - 7P or covering provider during after hours 7P -7A, for this patient?  Check the care team in [facility] and look for a) attending/consulting TRH provider listed and b) the TR[facility] team listed Log into [email or web address] and use [place]'s universal password to access. If you do not have the password, please contact the hospital operator. Locate the [facility] provider you are looking for under [facility] and page to a number that you can be directly reached. If you still have difficulty reaching the provider, please page the [facility] (Director on Call) for the Hospitalists listed on amion for assistance.

## 2024-05-11 NOTE — Plan of Care (Signed)
   Problem: Education: Goal: Knowledge of General Education information will improve Description Including pain rating scale, medication(s)/side effects and non-pharmacologic comfort measures Outcome: Progressing   Problem: Health Behavior/Discharge Planning: Goal: Ability to manage health-related needs will improve Outcome: Progressing

## 2024-05-11 NOTE — TOC Transition Note (Signed)
 Transition of Care [facility]) - Discharge Note   Patient Details  Name: Curtis Hartman MRN: [id number] Date of Birth: [birth year]  Transition of Care [facility]) CM/SW Contact:  Lucie Lunger, LCSWA Phone Number: 05/11/2024, 11:06 AM   Clinical Narrative:    CSW spoke with pt at bedside about plan for return home. Pt states he is comfortable returning home and that he does not need family assistance. Pt worked well with PT and recommendation is for OP PT, CSW made referral to [facility] PT clinic. TOC signing off.   Final next level of care: OP Rehab Barriers to Discharge: No Barriers Identified   Patient Goals and CMS Choice Patient states their goals for this hospitalization and ongoing recovery are:: reutrn home CMS Medicare.gov Compare Post Acute Care list provided to:: Patient Choice offered to / list presented to : Patient      Discharge Placement                       Discharge Plan and Services Additional resources added to the After Visit Summary for                                       Social Drivers of Health (SDOH) Interventions SDOH Screenings   Food Insecurity: No Food Insecurity (05/05/2024)  Housing: Low Risk  (05/05/2024)  Transportation Needs: No Transportation Needs (05/05/2024)  Utilities: Not At Risk (05/05/2024)  Financial Resource Strain: High Risk (01/15/2024)   Received from [facility]  Physical Activity: Unknown (01/15/2024)   Received from [facility]  Social Connections: Socially Isolated (05/05/2024)  Stress: Stress Concern Present (02/29/2024)   Received from [facility]  Tobacco Use: High Risk (05/04/2024)     Readmission Risk Interventions    05/09/2024   11:05 AM  Readmission Risk Prevention Plan  Transportation Screening Complete  HRI or Home Care Consult Complete  Social Work Consult for Recovery Care Planning/Counseling Complete  Palliative Care Screening Not Applicable  Medication Review Oceanographer) Complete

## 2024-05-11 NOTE — Telephone Encounter (Signed)
 Order placed and monitor mailed to pt home.

## 2024-05-11 NOTE — Discharge Summary (Addendum)
 Physician Discharge Summary   Patient: Curtis Hartman MRN: [id number] DOB: 1961/03/28  Admit date:     05/04/2024  Discharge date: 05/11/24  Discharge Physician: Alm Adanely Reynoso   PCP: Gerome Tillman LITTIE, FNP   Recommendations at discharge:   Please follow up with primary care provider within 1-2 weeks  Please repeat BMP and CBC in one week  Please follow up on/with     Hospital Course:  63 y.o. male with medical history significant for end-stage renal disease on hemodialysis on Monday Wednesday Fridays, COPD, type 2 diabetes mellitus, GERD, osteoarthritis, hypertension, chronic hepatitis C and peripheral neuropathy, who presented to the emergency room with acute onset of generalized weakness while he was in dialysis with unresponsiveness/syncope after which the patient was mildly confused.  No witnessed seizures.  He stated that he took 30 mg of p.o. morphine  sulfate this morning rather than 15 mg as his dose was recently increased.  He denied any fever or chills.  No chest pain or dyspnea or cough or wheezing.  No dysuria, urinary frequency or urgency, hematuria or flank pain.  He was unable to stand in the ER due to tremors in all 4 extremities.  He had diarrhea couple days ago which has resolved.  He admits to lower abdominal suprapubic pain.  He continues to smoke 5 cigarettes/day.  No paresthesias or focal muscle weakness.  No headache or dizziness or blurred vision.  No tinnitus or vertigo.  No bleeding diathesis.   ED Course: When he came to the ER, temperature was 99.5 and otherwise vital signs were within normal.  Labs revealed a BUN of 52 and creatinine 6.24, albumin  of 3.2 and CBC showed leukocytosis of 11.2 with neutrophilia and mild anemia.  GI panel was sent by the ED physician.   Imaging: Abdominal pelvic CT scan without contrast: 1. No hydronephrosis or nephrolithiasis. 2. No bowel obstruction. Normal appendix. 3. Right perianal collection or developing abscess. Ultrasound may provide  better evaluation. 4.  Aortic Atherosclerosis.  Assessment and Plan:  Acute metabolic encephalopathy--IMPROVED - associated with unresponsiveness/syncope before arrival - suspect related to adverse reaction to morphine  - DC morphine  -- agree with nephrologist; avoid morphine , can use hydromorphine, fentanyl , methadone for pain - we followed neurochecks  -- he is back to baseline; had dialysis treatment on 7/3, ordered MRI brain on 7/4  -- DC gabapentin >>can restart after d/c   Acute ischemic stroke  -- appreciate neurologist consultation and recommendations  -- pt starting aspirin  and plavix  therapy for secondary prevention  -- MRA ordered and no findings of severe intracranial stenosis, therefore recommendation is for 3 weeks of DAPT, followed by aspirin  81 mg daily  -- stroke work completed -- follow up lipid panel -- LDL is optimal at 67 therefore statin was NOT started  -- bedside swallow test was reassuring and placed on dysphagia diet for now -- smoking cessation counseling at bedside -- request sent to [facility] [place] office to provide him a 30 day cardiac monitor per neuro recs   End-stage renal disease on hemodialysis - He missed his hemodialysis session on 7/2, received inpatient HD on 7/3, 7/5, 7/8 - Nephrology consult appreciated   Chronic obstructive pulmonary disease (COPD)  - Will continue his inhalers.   Type 2 diabetes mellitus with peripheral neuropathy - The patient will be placed on supplemental coverage with NovoLog . - Will continue basal coverage  -- added SSI coverage in hospital only -- resume home treatments at discharge with CBG monitoring --7/4  A1C--4.9  Essential hypertension Will continue antihypertensive therapy Ok for normotension per neuro -increase coreg  to 6.25 mg bid -continue prn amlodipine    Peri-anal abscess -- given his AMS and increasing WBC and fever, I asked for surgery consult, pt had I&D at bedside - initially on  zosyn  -- pt did well, postprocedure instructions given to patient per surgeon and pt will follow up with Surgeon 7/17. DC home on 8 more days of augmentin .         Consultants: renal Procedures performed: bedside I&D 7/5  Disposition: Home Diet recommendation:  Renal diet DISCHARGE MEDICATION: Allergies as of 05/11/2024   No Known Allergies      Medication List     STOP taking these medications    gabapentin  400 MG capsule Commonly known as: NEURONTIN    morphine  30 MG tablet Commonly known as: MSIR   sodium bicarbonate  650 MG tablet       TAKE these medications    acetaminophen  325 MG tablet Commonly known as: TYLENOL  Take 1-2 tablets (325-650 mg total) by mouth every 4 (four) hours as needed for mild pain (pain score 1-3).   albuterol  108 (90 Base) MCG/ACT inhaler Commonly known as: ProAir  HFA 2 puffs every 4 hours as needed only  if your can't catch your breath   amLODipine  10 MG tablet Commonly known as: NORVASC  Take 10 mg by mouth daily as needed (diastolic blood pressure above 90).   amoxicillin -clavulanate 500-125 MG tablet Commonly known as: AUGMENTIN  Take 1 tablet by mouth daily for 4 days.   aspirin  81 MG chewable tablet Chew 1 tablet (81 mg total) by mouth daily.   Breztri  Aerosphere 160-9-4.8 MCG/ACT Aero inhaler Generic drug: budesonide-glycopyrrolate-formoterol  Inhale 2 puffs into the lungs in the morning and at bedtime. What changed:  when to take this reasons to take this   carvedilol  6.25 MG tablet Commonly known as: COREG  Take 1 tablet (6.25 mg total) by mouth 2 (two) times daily with a meal. What changed:  medication strength how much to take   clopidogrel  75 MG tablet Commonly known as: PLAVIX  Take 1 tablet (75 mg total) by mouth daily for 17 days.   diphenoxylate -atropine  2.5-0.025 MG tablet Commonly known as: LOMOTIL  Take 1 tablet by mouth 4 (four) times daily.   fluticasone  50 MCG/ACT nasal spray Commonly known as:  FLONASE  Place 2 sprays into both nostrils daily.   furosemide  80 MG tablet Commonly known as: LASIX  Take 80 mg by mouth daily as needed for edema or fluid.   hydrOXYzine  25 MG tablet Commonly known as: ATARAX  Take 25 mg by mouth daily.   Lantus  100 UNIT/ML injection Generic drug: insulin  glargine Inject 5 Units into the skin at bedtime as needed (blood sugar over 150).   oxybutynin  10 MG 24 hr tablet Commonly known as: DITROPAN -XL Take 10 mg by mouth daily.   saccharomyces boulardii 250 MG capsule Commonly known as: FLORASTOR Take 1 capsule (250 mg total) by mouth 2 (two) times daily for 14 days.        Follow-up Information     Kallie Manuelita BROCKS, MD Follow up on 05/19/2024.   Specialty: General Surgery Contact information: [address] [place] [facility] [address] [phone]         Gerome Tillman CROME, FNP. Schedule an appointment as soon as possible for a visit in 1 week(s).   Specialty: Family Medicine Why: Hospital Follow Up Contact information: [address] [place] [address] [phone]  [facility]. Schedule an appointment as soon as possible for a visit in 1 month(s).   Specialty: Neurology Why: Hospital Follow Up Contact information: [address] [place]  [address] [phone]               Discharge Exam: Fredricka Weights   05/07/24 1949 05/07/24 2051 05/10/24 1423  Weight: 62 kg 62 kg 59.5 kg   HEENT:  [place]/AT, No thrush, no icterus CV:  RRR, no rub, no S3, no S4 Lung:  CTA, no wheeze, no rhonchi Abd:  soft/+BS, NT Ext:  No edema, no lymphangitis, no synovitis, no rash   Condition at discharge: stable  The results of significant diagnostics from this hospitalization (including imaging, microbiology, ancillary and laboratory) are listed below for reference.   Imaging Studies: EEG adult Result Date: 05/09/2024 Shelton Arlin KIDD, MD     05/09/2024  5:04 PM Patient  Name: JEVON SHELLS MRN: [id number] Epilepsy Attending: Arlin KIDD Shelton Referring Physician/Provider: Shelton Arlin KIDD, MD Date: 05/09/2024 Duration: 22.06 mins Patient history: 63 y.o. 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 EEG to evaluate for seizure Level of alertness: Awake, asleep AEDs during EEG study: None Technical aspects: This EEG study was done with scalp electrodes positioned according to the 10-20 International system of electrode placement. Electrical activity was reviewed with band pass filter of 1-70Hz , sensitivity of 7 uV/mm, display speed of 6mm/sec with a 60Hz  notched filter applied as appropriate. EEG data were recorded continuously and digitally stored.  Video monitoring was available and reviewed as appropriate. Description: The posterior dominant rhythm consists of 7.5 Hz activity of moderate voltage (25-35 uV) seen predominantly in posterior head regions, symmetric and reactive to eye opening and eye closing. Sleep was characterized by vertex waves, sleep spindles (12 to 14 Hz), maximal frontocentral region. EEG showed intermittent generalized  3 to 6 Hz theta-delta slowing. Hyperventilation and photic stimulation were not performed.   ABNORMALITY - Intermittent slow, generalized IMPRESSION: This study is suggestive of mild diffuse encephalopathy. No seizures or epileptiform discharges were seen throughout the recording. Arlin KIDD Shelton   DG Chest 2 View Result Date: 05/09/2024 CLINICAL DATA:  Weakness. EXAM: CHEST - 2 VIEW COMPARISON:  05/07/2024. FINDINGS: Stable cardiomediastinal contours. No focal consolidation, pleural effusion, or pneumothorax. Partially visualized lower cervical fusion and lumbar fusion hardware. Degenerative changes of the thoracic spine. No acute osseous abnormality. IMPRESSION: No acute cardiopulmonary findings. Electronically Signed    By: Harrietta Sherry M.D.   On: 05/09/2024 14:09   DG CHEST PORT 1 VIEW Result Date: 05/07/2024 CLINICAL DATA:  Fever. EXAM: PORTABLE CHEST 1 VIEW COMPARISON:  Radiographs 05/04/2024. FINDINGS: 0805 hours. The heart size and mediastinal contours are normal. The lungs are clear. There is no pleural effusion or pneumothorax. No acute osseous findings are identified. Mild degenerative changes in the spine. Previous lower cervical fusion, partially imaged. IMPRESSION: No evidence of acute cardiopulmonary process. Electronically Signed   By: Elsie Perone M.D.   On: 05/07/2024 12:08   MR ANGIO HEAD WO CONTRAST Result Date: 05/06/2024 CLINICAL DATA:  Stroke follow-up EXAM: MRA HEAD WITHOUT CONTRAST TECHNIQUE: Angiographic images of the Circle of Willis were acquired using MRA technique without intravenous contrast. COMPARISON:  MRI head earlier same day. FINDINGS: Anterior circulation: The intracranial internal carotid arteries are patent. Atherosclerosis noted along the bilateral cavernous ICAs without evidence of high-grade stenosis. The  right M1 segment and right MCA bifurcation are patent. Proximal right M2 branches are patent. Distal right MCA branches are patent. The left M1 segment and left MCA bifurcation are patent. Proximal left M2 branches are patent. Distal left MCA branches are patent. The A1 and A2 segments are patent bilaterally. Distal ACA branches are symmetric. Posterior circulation: The visualized intracranial vertebral arteries are patent. Basilar artery is patent. The bilateral posterior cerebral arteries are patent. Superior cerebellar arteries are patent bilaterally. The PICA patent bilaterally. Anatomic variants: None significant Other: None. IMPRESSION: Patent intracranial arterial vasculature. Atherosclerosis of the bilateral cavernous ICAs without high-grade stenosis. Electronically Signed   By: Donnice Mania M.D.   On: 05/06/2024 17:00   MR BRAIN WO CONTRAST Result Date:  05/06/2024 CLINICAL DATA:  63 year old male with altered mental status. End stage renal disease. Weakness. EXAM: MRI HEAD WITHOUT CONTRAST TECHNIQUE: Multiplanar, multiecho pulse sequences of the brain and surrounding structures were obtained without intravenous contrast. COMPARISON:  None Available. FINDINGS: The examination was discontinued prior to completion by the patient. Diagnostic axial and coronal DWI, axial FLAIR and T2 * imaging only was obtained. Punctate superior posterior right frontal lobe white matter restricted diffusion on series 2, image 42 and series 3, image 42. No discrete FLAIR hyperintensity. Additional subcentimeter focus of restricted diffusion in the left lateral cerebellum on series 2, image 9. Furthermore, difficult to exclude additional punctate restricted diffusion in the medial left parietal lobe subcortical white matter (also on image 42), right midbrain (series 2, image 25). No midline shift, mass effect, evidence of mass lesion, ventriculomegaly, extra-axial collection or acute intracranial hemorrhage. Vague but confluent periventricular white matter T2 hyperintensity. This is moderately advanced. Chronic lacunar infarct in the right pons on series 6, image 17. Additional pontine signal heterogeneity. And T2 * images do suggest chronic microhemorrhage in the left pons, also the left cerebellum. No definite cortical encephalomalacia. Chronic small vessel ischemia and microhemorrhage in the brainstem. Confluent cerebral white matter signal changes also most commonly due to small vessel disease. IMPRESSION: 1. Truncated exam. 2. Positive for multiple subcentimeter acute lacunar type infarcts, including the posterior right frontal lobe white matter, left lateral cerebellum. Possible additional punctate involvement including in the right midbrain. Negative for evidence of acute intracranial hemorrhage or mass effect. 3. Evidence of underlying moderately advanced chronic small vessel  ischemia in the brain, including chronic lacunar infarcts in the right pons, chronic microhemorrhage in the left pons and left cerebellum. Electronically Signed   By: VEAR Hurst M.D.   On: 05/06/2024 11:04   ECHOCARDIOGRAM COMPLETE Result Date: 05/05/2024    ECHOCARDIOGRAM REPORT   Patient Name:   MELCHIZEDEK ESPINOLA Date of Exam: 05/05/2024 Medical Rec #:  [id number]        Height:       69.0 in Accession #:    [id number]       Weight:       138.4 lb Date of Birth:  12-21-1960        BSA:          1.767 m Patient Age:    62 years         BP:           133/70 mmHg Patient Gender: M                HR:           84 bpm. Exam Location:  Inpatient Procedure: 2D Echo, Color Doppler and Cardiac Doppler (Both Spectral  and Color            Flow Doppler were utilized during procedure). Indications:    Syncope  History:        Patient has no prior history of Echocardiogram examinations.  Sonographer:    Tinnie Gosling RDCS Referring Phys: [phone] CLANFORD L JOHNSON IMPRESSIONS  1. Left ventricular ejection fraction, by estimation, is 65 to 70%. The left ventricle has normal function. The left ventricle has no regional wall motion abnormalities. There is moderate concentric left ventricular hypertrophy. Left ventricular diastolic parameters were normal.  2. Right ventricular systolic function is normal. The right ventricular size is normal. Tricuspid regurgitation signal is inadequate for assessing PA pressure.  3. Left atrial size was moderately dilated.  4. The mitral valve is degenerative. Trivial mitral valve regurgitation.  5. The aortic valve is tricuspid. There is mild calcification of the aortic valve. Aortic valve regurgitation is not visualized.  6. The inferior vena cava is normal in size with greater than 50% respiratory variability, suggesting right atrial pressure of 3 mmHg. Comparison(s): No prior Echocardiogram. FINDINGS  Left Ventricle: Left ventricular ejection fraction, by estimation, is 65 to 70%. The left ventricle  has normal function. The left ventricle has no regional wall motion abnormalities. The left ventricular internal cavity size was normal in size. There is  moderate concentric left ventricular hypertrophy. Left ventricular diastolic parameters were normal. Right Ventricle: The right ventricular size is normal. No increase in right ventricular wall thickness. Right ventricular systolic function is normal. Tricuspid regurgitation signal is inadequate for assessing PA pressure. Left Atrium: Left atrial size was moderately dilated. Right Atrium: Right atrial size was normal in size. Pericardium: Trivial pericardial effusion is present. The pericardial effusion is posterior to the left ventricle. Mitral Valve: The mitral valve is degenerative in appearance. There is mild calcification of the mitral valve leaflet(s). Mild mitral annular calcification. Trivial mitral valve regurgitation. Tricuspid Valve: The tricuspid valve is grossly normal. Tricuspid valve regurgitation is mild. Aortic Valve: The aortic valve is tricuspid. There is mild calcification of the aortic valve. There is mild aortic valve annular calcification. Aortic valve regurgitation is not visualized. Pulmonic Valve: The pulmonic valve was grossly normal. Pulmonic valve regurgitation is trivial. Aorta: The aortic root is normal in size and structure. Venous: The inferior vena cava is normal in size with greater than 50% respiratory variability, suggesting right atrial pressure of 3 mmHg. IAS/Shunts: No atrial level shunt detected by color flow Doppler. Additional Comments: 3D was performed not requiring image post processing on an independent workstation and was indeterminate.  LEFT VENTRICLE PLAX 2D LVIDd:         4.90 cm   Diastology LVIDs:         2.90 cm   LV e' medial:    9.14 cm/s LV PW:         1.50 cm   LV E/e' medial:  9.7 LV IVS:        1.50 cm   LV e' lateral:   8.16 cm/s LVOT diam:     2.50 cm   LV E/e' lateral: 10.9 LV SV:         90 LV SV  Index:   51 LVOT Area:     4.91 cm  RIGHT VENTRICLE             IVC RV S prime:     20.00 cm/s  IVC diam: 1.10 cm TAPSE (M-mode): 3.5 cm LEFT ATRIUM  Index        RIGHT ATRIUM           Index LA diam:      3.50 cm 1.98 cm/m   RA Area:     13.70 cm LA Vol (A4C): 88.5 ml 50.08 ml/m  RA Volume:   32.50 ml  18.39 ml/m  AORTIC VALVE LVOT Vmax:   93.80 cm/s LVOT Vmean:  59.800 cm/s LVOT VTI:    0.184 m  AORTA Ao Root diam: 3.00 cm Ao Asc diam:  3.10 cm MITRAL VALVE MV Area (PHT): 4.29 cm    SHUNTS MV Decel Time: 177 msec    Systemic VTI:  0.18 m MV E velocity: 88.80 cm/s  Systemic Diam: 2.50 cm MV A velocity: 77.80 cm/s MV E/A ratio:  1.14 Jayson Sierras MD Electronically signed by Jayson Sierras MD Signature Date/Time: 05/05/2024/3:49:27 PM    Final    US  Carotid Bilateral Result Date: 05/05/2024 CLINICAL DATA:  63 year old male with history of syncope and collapse. EXAM: BILATERAL CAROTID DUPLEX ULTRASOUND TECHNIQUE: Elnor scale imaging, color Doppler and duplex ultrasound were performed of bilateral carotid and vertebral arteries in the neck. COMPARISON:  None Available. FINDINGS: Criteria: Quantification of carotid stenosis is based on velocity parameters that correlate the residual internal carotid diameter with NASCET-based stenosis levels, using the diameter of the distal internal carotid lumen as the denominator for stenosis measurement. The following velocity measurements were obtained: RIGHT ICA: Peak systolic velocity 119 cm/sec, End diastolic velocity 35 cm/sec CCA: Peak systolic velocity 113 cm/sec SYSTOLIC ICA/CCA RATIO:  1.1 ECA: Peak systolic velocity 162 cm/sec LEFT ICA: Peak systolic velocity 152 cm/sec, End diastolic velocity 25 cm/sec CCA: 110 cm/sec SYSTOLIC ICA/CCA RATIO:  1.4 ECA: 4138 cm/sec RIGHT CAROTID ARTERY: Moderate scattered atherosclerotic plaque formation most prominent about the carotid bulb and bifurcation. No significant tortuosity. Normal low resistance waveforms. RIGHT  VERTEBRAL ARTERY:  Antegrade flow. LEFT CAROTID ARTERY: Moderate scattered atherosclerotic plaque formationmost prominent about the carotid bulb and bifurcation. No significant tortuosity. Normal low resistance waveforms. LEFT VERTEBRAL ARTERY:  Antegrade flow. Upper extremity non-invasive blood pressures: Not obtained. IMPRESSION: 1. Right carotid artery system: Less than 50% stenosis secondary to moderate scattered atherosclerotic plaque formation, most prominent about the carotid bulb and bifurcation. 2. Left carotid artery system: Less than 50% stenosis secondary to moderate scattered atherosclerotic plaque formation, most prominent about the carotid bulb and bifurcation. 3.  Vertebral artery system: Patent with antegrade flow bilaterally. Ester Sides, MD [facility] Electronically Signed   By: Ester Sides M.D.   On: 05/05/2024 10:44   CT ABDOMEN PELVIS WO CONTRAST Result Date: 05/04/2024 CLINICAL DATA:  Abdominal pain. EXAM: CT ABDOMEN AND PELVIS WITHOUT CONTRAST TECHNIQUE: Multidetector CT imaging of the abdomen and pelvis was performed following the standard protocol without IV contrast. RADIATION DOSE REDUCTION: This exam was performed according to the departmental dose-optimization program which includes automated exposure control, adjustment of the mA and/or kV according to patient size and/or use of iterative reconstruction technique. COMPARISON:  None Available. FINDINGS: Evaluation of this exam is limited in the absence of intravenous contrast. Evaluation is also limited due to streak artifact caused by patient's arms and spinal hardware. Lower chest: The visualized lung bases are clear. There is coronary vascular calcification. No intra-abdominal free air or free fluid. Hepatobiliary: The liver is unremarkable. No biliary dilatation. The gallbladder is unremarkable. Pancreas: Unremarkable. No pancreatic ductal dilatation or surrounding  inflammatory changes. Spleen: Normal in size without  focal abnormality. Adrenals/Urinary Tract: The right adrenal gland is unremarkable. A 2 cm left adrenal adenoma. There is no hydronephrosis or nephrolithiasis on either side. The visualized ureters and urinary bladder appear unremarkable. Stomach/Bowel: There is no bowel obstruction or active inflammation. The appendix is normal. Vascular/Lymphatic: Moderate aortoiliac atherosclerotic disease. The IVC is unremarkable. No portal venous gas. There is no adenopathy. Reproductive: The prostate and seminal vesicles are grossly unremarkable. No pelvic mass. Other: Partially visualized 2.5 x 5.0 cm indurated area in the subcutaneous soft tissues of the right buttock adjacent to the intergluteal cleft suspicious for a perianal collection or developing abscess. Ultrasound may provide better evaluation. Musculoskeletal: Osteopenia with degenerative changes of the spine. Multilevel posterior fusion hardware. No acute osseous pathology. IMPRESSION: 1. No hydronephrosis or nephrolithiasis. 2. No bowel obstruction. Normal appendix. 3. Right perianal collection or developing abscess. Ultrasound may provide better evaluation. 4.  Aortic Atherosclerosis (ICD10-I70.0). Electronically Signed   By: Vanetta Chou M.D.   On: 05/04/2024 17:44   DG Chest Port 1 View Result Date: 05/04/2024 CLINICAL DATA:  106001 Syncope 106001 EXAM: PORTABLE CHEST - 1 VIEW COMPARISON:  None available. FINDINGS: No focal airspace consolidation, pleural effusion, or pneumothorax. No cardiomegaly. Aortic atherosclerosis. No acute fracture or destructive lesions. Multilevel thoracic osteophytosis. Partially visualized cervical fusion hardware IMPRESSION: No acute cardiopulmonary abnormality. Electronically Signed   By: Rogelia Myers M.D.   On: 05/04/2024 16:32    Microbiology: Results for orders placed or performed during the hospital encounter of 05/04/24  Gastrointestinal Panel by PCR , Stool      Status: None   Collection Time: 05/04/24  4:39 PM   Specimen: STOOL  Result Value Ref Range Status   Campylobacter species NOT DETECTED NOT DETECTED Final   Plesimonas shigelloides NOT DETECTED NOT DETECTED Final   Salmonella species NOT DETECTED NOT DETECTED Final   Yersinia enterocolitica NOT DETECTED NOT DETECTED Final   Vibrio species NOT DETECTED NOT DETECTED Final   Vibrio cholerae NOT DETECTED NOT DETECTED Final   Enteroaggregative E coli (EAEC) NOT DETECTED NOT DETECTED Final   Enteropathogenic E coli (EPEC) NOT DETECTED NOT DETECTED Final   Enterotoxigenic E coli (ETEC) NOT DETECTED NOT DETECTED Final   Shiga like toxin producing E coli (STEC) NOT DETECTED NOT DETECTED Final   Shigella/Enteroinvasive E coli (EIEC) NOT DETECTED NOT DETECTED Final   Cryptosporidium NOT DETECTED NOT DETECTED Final   Cyclospora cayetanensis NOT DETECTED NOT DETECTED Final   Entamoeba histolytica NOT DETECTED NOT DETECTED Final   Giardia lamblia NOT DETECTED NOT DETECTED Final   Adenovirus F40/41 NOT DETECTED NOT DETECTED Final   Astrovirus NOT DETECTED NOT DETECTED Final   Norovirus GI/GII NOT DETECTED NOT DETECTED Final   Rotavirus A NOT DETECTED NOT DETECTED Final   Sapovirus (I, II, IV, and V) NOT DETECTED NOT DETECTED Final    Comment: Performed at [facility], [address] [place], [place] [address]  Aerobic/Anaerobic Culture w Gram Stain (surgical/deep wound)     Status: None   Collection Time: 05/07/24 10:48 AM   Specimen: Perirectal; Abscess  Result Value Ref Range Status   Specimen Description   Final    PERIRECTAL ABSCESS Performed at [facility], [address] [place], [place] [address]    Special Requests   Final    PERIANUS Performed at [facility], [address] [place], [place] [address]    Gram Stain   Final    RARE WBC PRESENT, PREDOMINANTLY PMN NO ORGANISMS SEEN  Culture   Final    RARE MULTIPLE ORGANISMS PRESENT, NONE PREDOMINANT NO GROUP  A STREP (S.PYOGENES) ISOLATED NO STAPHYLOCOCCUS AUREUS ISOLATED MODERATE BACTEROIDES THETAIOTAOMICRON BETA LACTAMASE POSITIVE Performed at [facility], [address], [place], [place] [address]    Report Status 05/10/2024 FINAL  Final    Labs: CBC: Recent Labs  Lab 05/04/24 1606 05/05/24 0433 05/07/24 0301 05/08/24 0330 05/09/24 0504  WBC 11.2* 10.6* 14.5* 10.0 8.9  NEUTROABS 9.4*  --   --  7.5 6.1  HGB 12.1* 11.3* 10.4* 10.9* 11.5*  HCT 37.6* 35.1* 33.7* 34.9* 36.2*  MCV 93.8 94.1 94.1 92.6 90.7  PLT 264 286 281 318 352   Basic Metabolic Panel: Recent Labs  Lab 05/07/24 0301 05/08/24 0330 05/09/24 0504 05/10/24 0427 05/11/24 0436  NA 133* 134* 136 136 137  K 4.5 4.3 5.0 4.6 4.0  CL 98 93* 96* 100 96*  CO2 25 27 25 23 26   GLUCOSE 70 100* 101* 130* 100*  BUN 49* 36* 54* 75* 43*  CREATININE 6.30* 4.53* 6.15* 7.03* 4.73*  CALCIUM  8.3* 8.6* 8.9 8.7* 9.1  PHOS 6.4* 5.0* 6.7* 7.1* 5.5*   Liver Function Tests: Recent Labs  Lab 05/04/24 1606 05/06/24 1137 05/07/24 0301 05/08/24 0330 05/09/24 0504 05/10/24 0427 05/11/24 0436  AST 14*  --   --   --   --   --   --   ALT 9  --   --   --   --   --   --   ALKPHOS 52  --   --   --   --   --   --   BILITOT 0.6  --   --   --   --   --   --   PROT 6.6  --   --   --   --   --   --   ALBUMIN  3.2*   < > 2.7* 2.6* 2.8* 2.8* 3.1*   < > = values in this interval not displayed.   CBG: Recent Labs  Lab 05/10/24 0732 05/10/24 1137 05/10/24 2128 05/11/24 0308 05/11/24 0817  GLUCAP 121* 199* 106* 117* 107*    Discharge time spent: greater than 30 minutes.  Signed: Alm Schneider, MD [facility] 05/11/2024
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
## 2024-05-11 NOTE — Progress Notes (Signed)
 OT Cancellation Note  Patient Details Name: Curtis Hartman MRN: [id number] DOB: 1961-05-28   Cancelled Treatment:    Reason Eval/Treat Not Completed: Pain limiting ability to participate. Pt declined due to stomach pain and reports that L arm port area had started bleeding. Will attempt treatment later as time permits.    Rosanne Wohlfarth OT, MOT  Jayson Person 05/11/2024, 9:25 AM

## 2024-05-13 ENCOUNTER — Encounter: Payer: Self-pay | Admitting: Internal Medicine

## 2024-05-20 NOTE — Telephone Encounter (Signed)
 Received a fax from [employer] stating that they have been unable to hook up pt d/t patient requesting a delay in hookup.

## 2024-05-24 ENCOUNTER — Encounter: Payer: Self-pay | Admitting: Internal Medicine

## 2024-05-30 ENCOUNTER — Encounter: Payer: Self-pay | Admitting: Gastroenterology

## 2024-05-31 NOTE — Progress Notes (Deleted)
 Curtis Hartman [id number] [birth year]   Chief Complaint:  Referring Provider: Gerome Tillman LITTIE, FNP Primary GI MD: Sampson (previously seen at [facility])  HPI: Curtis Hartman is a 63 y.o. male with past medical history of chronic hepatitis C, GERD, diarrhea predominant IBS, ESRD on hemodialysis on Monday/Wednesday/Friday, COPD, T2DM, HTN, peripheral neuropathy who presents today for a complaint of chronic diarrhea.    Hepatitis C history: Genotype 2, viral load 2.4 million HIV and hepatitis B negative. Right upper quadrant ultrasound with elastography unremarkable, median K PA 4.5. Completed 8 weeks of Mavyret Viral load unremarkable 06/18/2022.  Last seen by [facility] 04/21/2023 for follow-up of multiple complaints.  Chronic hepatitis C stable at that time and patient denied any jaundice, pruritus, or mental status changes.  Had a few intermittent flares of IBS with diarrhea, no melena or BRBPR.  Uses Imodium  and stool softener as needed.  Was on once daily PPI at that time with fairly good control of symptoms.  Uses famotidine OTC as needed for flares.  Had EGD/colonoscopy 03/03/2022 with finding of internal hemorrhoids, adenomatous colon polyps, with recommended recall in 5 years, and gastritis.  He was recently admitted to [facility] from 05/04/2024 - 05/11/2024 for acute ischemic stroke, after presenting to the ED with acute onset generalized weakness while he was doing dialysis with unresponsiveness/syncope.  CT A/P without contrast showed no hydronephrosis or nephrolithiasis, no bowel obstruction, but there was a right perianal collection or developing abscess noted. MRA ordered and no findings of severe intracranial stenosis, therefore recommendation was for 3 weeks of DAPT, followed by aspirin  81 mg daily.  Bedside swallow test was reassuring and placed on dysphagia diet.  Request was sent to [facility] [place] to provide him a 30-day cardiac monitor. Given  altered mental status and increasing WBC and fever, surgery was consulted and performed I&D at bedside of his perianal abscess, he was initially put on Zosyn  and did well postprocedure.  Discharged home on 8 more days of Augmentin .   Previous GI Procedures/Imaging   CT A/P 05/04/2024 1. No hydronephrosis or nephrolithiasis. 2. No bowel obstruction. Normal appendix. 3. Right perianal collection or developing abscess. Ultrasound may provide better evaluation. 4.  Aortic Atherosclerosis (ICD10-I70.0).  Colonoscopy 03/03/2022 - Non- bleeding internal hemorrhoids.  - One 6 mm polyp in the transverse colon, removed with a cold snare. Resected and retrieved.  - One 5 mm polyp in the sigmoid colon, removed with a cold snare. Resected and retrieved.  - The examined portion of the ileum was normal. - Biopsies were taken with a cold forceps from the ascending colon, transverse colon and descending colon for evaluation of microscopic colitis. - Recall 5 years Path: B. COLON, RANDOM, BIOPSY:  -  Benign colonic mucosa  -  No active inflammation or evidence of microscopic colitis  -  No high-grade dysplasia or malignancy identified   C. COLON, TRANSVERSE, POLYPECTOMY:  -  Tubular adenoma (1 fragments)  -  Multiple fragments of benign colonic mucosa  -  No high-grade dysplasia or malignancy identified   D. COLON, SIGMOID, POLYPECTOMY:  -  Tubular adenoma (1 of 4 fragments)  -  Benign colonic mucosa (3 of 4 fragments)  -  No high-grade dysplasia or malignancy identified   EGD 03/03/2022 - Z- line regular, 39 cm from the incisors.  - Gastritis. Biopsied.  - Erythematous duodenopathy. Path: A. STOMACH, BIOPSY:  -  Benign gastric mucosa  -  No H. pylori,  intestinal metaplasia or malignancy identified   Past Medical History:  Diagnosis Date   Arthritis    CKD (chronic kidney disease)    COPD (chronic obstructive pulmonary disease) (HCC)    Diabetes mellitus without complication (HCC)    type 2    GERD (gastroesophageal reflux disease)    Headache    Hx Migraines   Hepatitis    Hypertension    Neuropathy     Past Surgical History:  Procedure Laterality Date   ANTERIOR CERVICAL DECOMP/DISCECTOMY FUSION N/A 11/05/2022   Procedure: Anterior Cervical Decompression/discectomy Fusion Cervical Four-Cervical Five;  Surgeon: Debby Dorn MATSU, MD;  Location: [facility] OR;  Service: Neurosurgery;  Laterality: N/A;  3C   ANTERIOR LAT LUMBAR FUSION N/A 09/01/2023   Procedure: DIRECT LUMBAR INTERBODY FUSION, LUMBAR TWO-LUMBAR THREE, LUMBAR THREE-LUMBAR FOUR, LUMBAR FOUR-LUMBAR FIVE, RIGHT PRONE TRANSPSOAS, LUMBAR TWO LUMBAR FIVE POSTERIOR PERCUTANEOUS FUSION LUMBAR FOUR-LUMBAR FIVE MINIMALLY INVASIVE LAMINECTOMY, FORAMINOTOMY;  Surgeon: Debby Dorn MATSU, MD;  Location: [facility] OR;  Service: Neurosurgery;  Laterality: N/A;   AV FISTULA PLACEMENT Left 09/10/2023   Procedure: LEFT ARM ARTERIOVENOUS (AV) FISTULA CREATION;  Surgeon: Pearline Norman RAMAN, MD;  Location[facility] OR;  Service: Vascular;  Laterality: Left;   BACK SURGERY  2023   Cervical Spine   BIOPSY  03/03/2022   Procedure: BIOPSY;  Surgeon: Cindie Carlin POUR, DO;  Location: [facility]ITE;  Service: Endoscopy;;   COLONOSCOPY WITH PROPOFOL  N/A 03/03/2022   Procedure: COLONOSCOPY WITH PROPOFOL ;  Surgeon: Cindie Carlin POUR, DO;  Location: [facility];  Service: Endoscopy;  Laterality: N/A;  8:00am   ESOPHAGOGASTRODUODENOSCOPY (EGD) WITH PROPOFOL  N/A 03/03/2022   Procedure: ESOPHAGOGASTRODUODENOSCOPY (EGD) WITH PROPOFOL ;  Surgeon: Cindie Carlin POUR, DO;  Location: [facility];  Service: Endoscopy;  Laterality: N/A;   foot right partial toe amputation Right    FOOT SURGERY Right    x3   hernia surgery x4     LUMBAR PERCUTANEOUS PEDICLE SCREW 3 LEVEL N/A 09/01/2023   Procedure: L2-5 LUMBAR PERCUTANEOUS PEDICLE SCREW 3 LEVEL;  Surgeon: Debby Dorn MATSU, MD;  Location: [facility] OR;  Service: Neurosurgery;  Laterality: N/A;   POLYPECTOMY  03/03/2022    Procedure: POLYPECTOMY;  Surgeon: Cindie Carlin POUR, DO;  Location: [facility];  Service: Endoscopy;;    Current Outpatient Medications  Medication Sig Dispense Refill   acetaminophen  (TYLENOL ) 325 MG tablet Take 1-2 tablets (325-650 mg total) by mouth every 4 (four) hours as needed for mild pain (pain score 1-3).     albuterol  (PROAIR  HFA) 108 (90 Base) MCG/ACT inhaler 2 puffs every 4 hours as needed only  if your can't catch your breath 18 g 11   amLODipine  (NORVASC ) 10 MG tablet Take 10 mg by mouth daily as needed (diastolic blood pressure above 90).     amoxicillin -clavulanate (AUGMENTIN ) 500-125 MG tablet Take 1 tablet by mouth daily. 4 tablet 0   aspirin  81 MG chewable tablet Chew 1 tablet (81 mg total) by mouth daily. 30 tablet 2   Budeson-Glycopyrrol-Formoterol  (BREZTRI  AEROSPHERE) 160-9-4.8 MCG/ACT AERO Inhale 2 puffs into the lungs in the morning and at bedtime. (Patient taking differently: Inhale 2 puffs into the lungs 2 (two) times daily as needed (shortness of breath).)     carvedilol  (COREG ) 6.25 MG tablet Take 1 tablet (6.25 mg total) by mouth 2 (two) times daily with a meal. 60 tablet 1   diphenoxylate -atropine  (LOMOTIL ) 2.5-0.025 MG tablet Take 1 tablet by mouth 4 (four) times daily.  fluticasone  (FLONASE ) 50 MCG/ACT nasal spray Place 2 sprays into both nostrils daily.     furosemide  (LASIX ) 80 MG tablet Take 80 mg by mouth daily as needed for edema or fluid.     hydrOXYzine  (ATARAX ) 25 MG tablet Take 25 mg by mouth daily.     LANTUS  100 UNIT/ML injection Inject 5 Units into the skin at bedtime as needed (blood sugar over 150).     oxybutynin  (DITROPAN -XL) 10 MG 24 hr tablet Take 10 mg by mouth daily.     No current facility-administered medications for this visit.    Allergies as of 06/01/2024   (No Known Allergies)    Family History  Problem Relation Age of Onset   Heart disease Mother    Seizures Father    Stroke Maternal Grandfather    Liver disease Paternal  Grandmother     Social History   Tobacco Use   Smoking status: Every Day    Current packs/day: 0.25    Types: Cigarettes    Passive exposure: Never   Smokeless tobacco: Never   Tobacco comments:    Pt smokes a couple cigarettes a day (as of 08/26/23)  Vaping Use   Vaping status: Never Used  Substance Use Topics   Alcohol use: Not Currently   Drug use: Not Currently     Review of Systems:    Constitutional: No weight loss, fever, chills, weakness or fatigue Eyes: No change in vision Ears, Nose, Throat:  No change in hearing or congestion Skin: No rash or itching Cardiovascular: No chest pain, chest pressure or palpitations   Respiratory: No SOB or cough Gastrointestinal: See HPI and otherwise negative Genitourinary: No dysuria or change in urinary frequency Neurological: No headache, dizziness or syncope Musculoskeletal: No new muscle or joint pain Hematologic: No bleeding or bruising    Physical Exam:  Vital signs: There were no vitals taken for this visit.  Constitutional: NAD, Well developed, Well nourished, alert and cooperative Head:  Normocephalic and atraumatic.  Eyes: No scleral icterus. Conjunctiva pink. Mouth: No oral lesions. Respiratory: Respirations even and unlabored. Lungs clear to auscultation bilaterally.  No wheezes, crackles, or rhonchi.  Cardiovascular:  Regular rate and rhythm. No murmurs. No peripheral edema. Gastrointestinal:  Soft, nondistended, nontender. No rebound or guarding. Normal bowel sounds. No appreciable masses or hepatomegaly. Rectal:  Not performed.  Neurologic:  Alert and oriented x4;  grossly normal neurologically.  Skin:   Dry and intact without significant lesions or rashes. Psychiatric: Oriented to person, place and time. Demonstrates good judgement and reason without abnormal affect or behaviors.   RELEVANT LABS AND IMAGING: CBC    Component Value Date/Time   WBC 8.9 05/09/2024 0504   RBC 3.99 (L) 05/09/2024 0504   HGB  11.5 (L) 05/09/2024 0504   HCT 36.2 (L) 05/09/2024 0504   PLT 352 05/09/2024 0504   MCV 90.7 05/09/2024 0504   MCH 28.8 05/09/2024 0504   MCHC 31.8 05/09/2024 0504   RDW 13.9 05/09/2024 0504   LYMPHSABS 1.4 05/09/2024 0504   MONOABS 1.0 05/09/2024 0504   EOSABS 0.2 05/09/2024 0504   BASOSABS 0.1 05/09/2024 0504    CMP     Component Value Date/Time   NA 137 05/11/2024 0436   K 4.0 05/11/2024 0436   CL 96 (L) 05/11/2024 0436   CO2 26 05/11/2024 0436   GLUCOSE 100 (H) 05/11/2024 0436   BUN 43 (H) 05/11/2024 0436   CREATININE 4.73 (H) 05/11/2024 0436   CREATININE 2.78 (H)  11/27/2022 1317   CALCIUM  9.1 05/11/2024 0436   PROT 6.6 05/04/2024 1606   ALBUMIN  3.1 (L) 05/11/2024 0436   AST 14 (L) 05/04/2024 1606   ALT 9 05/04/2024 1606   ALKPHOS 52 05/04/2024 1606   BILITOT 0.6 05/04/2024 1606   GFRNONAA 13 (L) 05/11/2024 0436     Assessment/Plan:   Consider Xifaxan for IBS-D    Camie Furbish, PA-C [facility] 05/31/2024, 9:03 PM  Patient Care Team: Gerome Tillman CROME, FNP as PCP - General (Family Medicine) [place], [facility]

## 2024-06-01 ENCOUNTER — Ambulatory Visit: Admitting: Gastroenterology

## 2024-06-02 ENCOUNTER — Encounter: Admitting: General Surgery

## 2024-06-02 ENCOUNTER — Other Ambulatory Visit: Payer: Self-pay

## 2024-06-02 ENCOUNTER — Encounter (HOSPITAL_COMMUNITY): Payer: Self-pay

## 2024-06-02 ENCOUNTER — Emergency Department (HOSPITAL_COMMUNITY)

## 2024-06-02 ENCOUNTER — Inpatient Hospital Stay (HOSPITAL_COMMUNITY)
Admission: EM | Admit: 2024-06-02 | Discharge: 2024-06-08 | DRG: 640 | Disposition: A | Discharge status: Skilled Nursing Facility | Attending: Internal Medicine | Admitting: Internal Medicine

## 2024-06-02 DIAGNOSIS — G9341 Metabolic encephalopathy: Secondary | ICD-10-CM | POA: Diagnosis present

## 2024-06-02 DIAGNOSIS — N186 End stage renal disease: Secondary | ICD-10-CM | POA: Diagnosis present

## 2024-06-02 DIAGNOSIS — F1120 Opioid dependence, uncomplicated: Secondary | ICD-10-CM | POA: Diagnosis not present

## 2024-06-02 DIAGNOSIS — Z91158 Patient's noncompliance with renal dialysis for other reason: Secondary | ICD-10-CM

## 2024-06-02 DIAGNOSIS — Z823 Family history of stroke: Secondary | ICD-10-CM | POA: Diagnosis not present

## 2024-06-02 DIAGNOSIS — Z8249 Family history of ischemic heart disease and other diseases of the circulatory system: Secondary | ICD-10-CM

## 2024-06-02 DIAGNOSIS — F17210 Nicotine dependence, cigarettes, uncomplicated: Secondary | ICD-10-CM | POA: Diagnosis present

## 2024-06-02 DIAGNOSIS — Z8673 Personal history of transient ischemic attack (TIA), and cerebral infarction without residual deficits: Secondary | ICD-10-CM

## 2024-06-02 DIAGNOSIS — Z7982 Long term (current) use of aspirin: Secondary | ICD-10-CM | POA: Diagnosis not present

## 2024-06-02 DIAGNOSIS — Z79899 Other long term (current) drug therapy: Secondary | ICD-10-CM

## 2024-06-02 DIAGNOSIS — I120 Hypertensive chronic kidney disease with stage 5 chronic kidney disease or end stage renal disease: Secondary | ICD-10-CM | POA: Diagnosis present

## 2024-06-02 DIAGNOSIS — W010XXA Fall on same level from slipping, tripping and stumbling without subsequent striking against object, initial encounter: Secondary | ICD-10-CM | POA: Diagnosis present

## 2024-06-02 DIAGNOSIS — Z5982 Transportation insecurity: Secondary | ICD-10-CM

## 2024-06-02 DIAGNOSIS — S40011A Contusion of right shoulder, initial encounter: Secondary | ICD-10-CM

## 2024-06-02 DIAGNOSIS — S161XXA Strain of muscle, fascia and tendon at neck level, initial encounter: Secondary | ICD-10-CM

## 2024-06-02 DIAGNOSIS — K219 Gastro-esophageal reflux disease without esophagitis: Secondary | ICD-10-CM | POA: Diagnosis present

## 2024-06-02 DIAGNOSIS — D649 Anemia, unspecified: Secondary | ICD-10-CM | POA: Diagnosis not present

## 2024-06-02 DIAGNOSIS — Y92009 Unspecified place in unspecified non-institutional (private) residence as the place of occurrence of the external cause: Secondary | ICD-10-CM

## 2024-06-02 DIAGNOSIS — Z794 Long term (current) use of insulin: Secondary | ICD-10-CM

## 2024-06-02 DIAGNOSIS — E875 Hyperkalemia: Principal | ICD-10-CM | POA: Diagnosis present

## 2024-06-02 DIAGNOSIS — F419 Anxiety disorder, unspecified: Secondary | ICD-10-CM | POA: Diagnosis present

## 2024-06-02 DIAGNOSIS — I953 Hypotension of hemodialysis: Secondary | ICD-10-CM | POA: Diagnosis not present

## 2024-06-02 DIAGNOSIS — J449 Chronic obstructive pulmonary disease, unspecified: Secondary | ICD-10-CM | POA: Diagnosis present

## 2024-06-02 DIAGNOSIS — D631 Anemia in chronic kidney disease: Secondary | ICD-10-CM | POA: Diagnosis present

## 2024-06-02 DIAGNOSIS — E1142 Type 2 diabetes mellitus with diabetic polyneuropathy: Secondary | ICD-10-CM | POA: Diagnosis present

## 2024-06-02 DIAGNOSIS — I10 Essential (primary) hypertension: Secondary | ICD-10-CM | POA: Diagnosis present

## 2024-06-02 DIAGNOSIS — G9340 Encephalopathy, unspecified: Secondary | ICD-10-CM | POA: Diagnosis not present

## 2024-06-02 DIAGNOSIS — E785 Hyperlipidemia, unspecified: Secondary | ICD-10-CM | POA: Diagnosis present

## 2024-06-02 DIAGNOSIS — K589 Irritable bowel syndrome without diarrhea: Secondary | ICD-10-CM | POA: Diagnosis present

## 2024-06-02 DIAGNOSIS — E1122 Type 2 diabetes mellitus with diabetic chronic kidney disease: Secondary | ICD-10-CM | POA: Diagnosis present

## 2024-06-02 DIAGNOSIS — Z992 Dependence on renal dialysis: Secondary | ICD-10-CM | POA: Diagnosis not present

## 2024-06-02 DIAGNOSIS — Z981 Arthrodesis status: Secondary | ICD-10-CM

## 2024-06-02 DIAGNOSIS — G8929 Other chronic pain: Secondary | ICD-10-CM | POA: Diagnosis present

## 2024-06-02 DIAGNOSIS — R55 Syncope and collapse: Principal | ICD-10-CM

## 2024-06-02 DIAGNOSIS — S0990XA Unspecified injury of head, initial encounter: Secondary | ICD-10-CM

## 2024-06-02 LAB — COMPREHENSIVE METABOLIC PANEL WITH GFR
ALT: 7 U/L (ref 0–44)
AST: 12 U/L — ABNORMAL LOW (ref 15–41)
Albumin: 3 g/dL — ABNORMAL LOW (ref 3.5–5.0)
Alkaline Phosphatase: 53 U/L (ref 38–126)
Anion gap: 16 — ABNORMAL HIGH (ref 5–15)
BUN: 90 mg/dL — ABNORMAL HIGH (ref 8–23)
CO2: 18 mmol/L — ABNORMAL LOW (ref 22–32)
Calcium: 9 mg/dL (ref 8.9–10.3)
Chloride: 108 mmol/L (ref 98–111)
Creatinine, Ser: 10.13 mg/dL — ABNORMAL HIGH (ref 0.61–1.24)
GFR, Estimated: 5 mL/min — ABNORMAL LOW (ref >60)
Glucose, Bld: 89 mg/dL (ref 70–99)
Potassium: 6.4 mmol/L (ref 3.5–5.1)
Sodium: 142 mmol/L (ref 135–145)
Total Bilirubin: 1.1 mg/dL (ref 0.0–1.2)
Total Protein: 6.5 g/dL (ref 6.5–8.1)

## 2024-06-02 LAB — MRSA NEXT GEN BY PCR, NASAL: MRSA by PCR Next Gen: NOT DETECTED

## 2024-06-02 LAB — CBC WITH DIFFERENTIAL/PLATELET
Abs Immature Granulocytes: 0.03 K/uL (ref 0.00–0.07)
Basophils Absolute: 0.1 K/uL (ref 0.0–0.1)
Basophils Relative: 1 %
Eosinophils Absolute: 0.2 K/uL (ref 0.0–0.5)
Eosinophils Relative: 2 %
HCT: 29.3 % — ABNORMAL LOW (ref 39.0–52.0)
Hemoglobin: 9.3 g/dL — ABNORMAL LOW (ref 13.0–17.0)
Immature Granulocytes: 0 %
Lymphocytes Relative: 13 %
Lymphs Abs: 1.2 K/uL (ref 0.7–4.0)
MCH: 30.1 pg (ref 26.0–34.0)
MCHC: 31.7 g/dL (ref 30.0–36.0)
MCV: 94.8 fL (ref 80.0–100.0)
Monocytes Absolute: 0.9 K/uL (ref 0.1–1.0)
Monocytes Relative: 10 %
Neutro Abs: 6.8 K/uL (ref 1.7–7.7)
Neutrophils Relative %: 74 %
Platelets: 278 K/uL (ref 150–400)
RBC: 3.09 MIL/uL — ABNORMAL LOW (ref 4.22–5.81)
RDW: 14.3 % (ref 11.5–15.5)
WBC: 9.2 K/uL (ref 4.0–10.5)
nRBC: 0 % (ref 0.0–0.2)

## 2024-06-02 LAB — CBG MONITORING, ED: Glucose-Capillary: 80 mg/dL (ref 70–99)

## 2024-06-02 LAB — CK: Total CK: 197 U/L (ref 49–397)

## 2024-06-02 LAB — HEPATITIS B SURFACE ANTIGEN: Hepatitis B Surface Ag: NONREACTIVE

## 2024-06-02 LAB — GLUCOSE, CAPILLARY
Glucose-Capillary: 79 mg/dL (ref 70–99)
Glucose-Capillary: 80 mg/dL (ref 70–99)
Glucose-Capillary: 99 mg/dL (ref 70–99)

## 2024-06-02 LAB — POTASSIUM: Potassium: 6.3 mmol/L (ref 3.5–5.1)

## 2024-06-02 MED ORDER — ACETAMINOPHEN 650 MG RE SUPP
650.0000 mg | Freq: Four times a day (QID) | RECTAL | Status: DC | PRN
Start: 2024-06-02 — End: 2024-06-08

## 2024-06-02 MED ORDER — ALBUTEROL SULFATE (2.5 MG/3ML) 0.083% IN NEBU: 2.5000 mg | INHALATION_SOLUTION | Freq: Four times a day (QID) | RESPIRATORY_TRACT | Status: DC | PRN

## 2024-06-02 MED ORDER — HEPARIN SODIUM (PORCINE) 5000 UNIT/ML IJ SOLN
5000.0000 [IU] | Freq: Three times a day (TID) | INTRAMUSCULAR | Status: DC
  Administered 2024-06-02 – 2024-06-08 (×18): 5000 [IU] via SUBCUTANEOUS
  Filled 2024-06-02 (×18): qty 1

## 2024-06-02 MED ORDER — PROCHLORPERAZINE EDISYLATE 10 MG/2ML IJ SOLN: 5.0000 mg | Freq: Four times a day (QID) | INTRAMUSCULAR | Status: DC | PRN

## 2024-06-02 MED ORDER — CHLORHEXIDINE GLUCONATE CLOTH 2 % EX PADS
6.0000 | MEDICATED_PAD | Freq: Every day | CUTANEOUS | Status: DC
  Administered 2024-06-02 – 2024-06-08 (×5): 6 via TOPICAL

## 2024-06-02 MED ORDER — BUDESON-GLYCOPYRROL-FORMOTEROL 160-9-4.8 MCG/ACT IN AERO: 2.0000 | INHALATION_SPRAY | Freq: Two times a day (BID) | RESPIRATORY_TRACT | Status: DC

## 2024-06-02 MED ORDER — SODIUM CHLORIDE 0.9% FLUSH
3.0000 mL | Freq: Two times a day (BID) | INTRAVENOUS | Status: DC
  Administered 2024-06-02 – 2024-06-08 (×12): 3 mL via INTRAVENOUS

## 2024-06-02 MED ORDER — SODIUM ZIRCONIUM CYCLOSILICATE 5 G PO PACK
10.0000 g | PACK | Freq: Once | ORAL | Status: AC
  Administered 2024-06-02: 10 g via ORAL
  Filled 2024-06-02: qty 2

## 2024-06-02 MED ORDER — ACETAMINOPHEN 325 MG PO TABS
650.0000 mg | ORAL_TABLET | Freq: Four times a day (QID) | ORAL | Status: DC | PRN
  Administered 2024-06-03 – 2024-06-06 (×2): 650 mg via ORAL
  Filled 2024-06-02 (×2): qty 2

## 2024-06-02 MED ORDER — BUDESON-GLYCOPYRROL-FORMOTEROL 160-9-4.8 MCG/ACT IN AERO
2.0000 | INHALATION_SPRAY | Freq: Two times a day (BID) | RESPIRATORY_TRACT | Status: DC
  Administered 2024-06-02 – 2024-06-08 (×10): 2 via RESPIRATORY_TRACT
  Filled 2024-06-02 (×2): qty 5.9

## 2024-06-02 MED ORDER — INSULIN ASPART 100 UNIT/ML IJ SOLN
0.0000 [IU] | Freq: Three times a day (TID) | INTRAMUSCULAR | Status: DC
  Administered 2024-06-03 – 2024-06-07 (×3): 1 [IU] via SUBCUTANEOUS

## 2024-06-02 MED ORDER — CARVEDILOL 3.125 MG PO TABS
6.2500 mg | ORAL_TABLET | Freq: Two times a day (BID) | ORAL | Status: DC
  Administered 2024-06-02 – 2024-06-08 (×12): 6.25 mg via ORAL
  Filled 2024-06-02 (×12): qty 2

## 2024-06-02 MED ORDER — INSULIN ASPART 100 UNIT/ML IJ SOLN: 0.0000 [IU] | Freq: Every day | INTRAMUSCULAR | Status: DC

## 2024-06-02 MED ORDER — ASPIRIN 81 MG PO CHEW
81.0000 mg | CHEWABLE_TABLET | Freq: Every day | ORAL | Status: DC
  Administered 2024-06-02 – 2024-06-08 (×7): 81 mg via ORAL
  Filled 2024-06-02 (×7): qty 1

## 2024-06-02 MED ORDER — PENTAFLUOROPROP-TETRAFLUOROETH EX AERO: 1.0000 | INHALATION_SPRAY | CUTANEOUS | Status: DC | PRN

## 2024-06-02 MED ORDER — PENTAFLUOROPROP-TETRAFLUOROETH EX AERO
INHALATION_SPRAY | CUTANEOUS | Status: AC
  Filled 2024-06-02: qty 30

## 2024-06-02 MED ORDER — SENNA 8.6 MG PO TABS: 1.0000 | ORAL_TABLET | Freq: Every day | ORAL | Status: DC | PRN

## 2024-06-02 NOTE — Progress Notes (Signed)
 ASSUMPTION OF CARE NOTE   06/02/2024 1:15 PM  Curtis Hartman was seen and examined.  The H&P by the admitting provider, orders, imaging was reviewed.  Please see new orders.  Will continue to follow.   Vitals:   06/02/24 1230 06/02/24 1300  BP: 122/72 109/66  Pulse: 67 66  Resp: 17 14  Temp:    SpO2: 99% 100%    Results for orders placed or performed during the hospital encounter of 06/02/24  CK   Collection Time: 06/02/24  2:51 AM  Result Value Ref Range   Total CK 197 49 - 397 U/L  CBC with Differential   Collection Time: 06/02/24  2:51 AM  Result Value Ref Range   WBC 9.2 4.0 - 10.5 K/uL   RBC 3.09 (L) 4.22 - 5.81 MIL/uL   Hemoglobin 9.3 (L) 13.0 - 17.0 g/dL   HCT 70.6 (L) 60.9 - 47.9 %   MCV 94.8 80.0 - 100.0 fL   MCH 30.1 26.0 - 34.0 pg   MCHC 31.7 30.0 - 36.0 g/dL   RDW 85.6 88.4 - 84.4 %   Platelets 278 150 - 400 K/uL   nRBC 0.0 0.0 - 0.2 %   Neutrophils Relative % 74 %   Neutro Abs 6.8 1.7 - 7.7 K/uL   Lymphocytes Relative 13 %   Lymphs Abs 1.2 0.7 - 4.0 K/uL   Monocytes Relative 10 %   Monocytes Absolute 0.9 0.1 - 1.0 K/uL   Eosinophils Relative 2 %   Eosinophils Absolute 0.2 0.0 - 0.5 K/uL   Basophils Relative 1 %   Basophils Absolute 0.1 0.0 - 0.1 K/uL   Immature Granulocytes 0 %   Abs Immature Granulocytes 0.03 0.00 - 0.07 K/uL  Comprehensive metabolic panel   Collection Time: 06/02/24  2:51 AM  Result Value Ref Range   Sodium 142 135 - 145 mmol/L   Potassium 6.4 (HH) 3.5 - 5.1 mmol/L   Chloride 108 98 - 111 mmol/L   CO2 18 (L) 22 - 32 mmol/L   Glucose, Bld 89 70 - 99 mg/dL   BUN 90 (H) 8 - 23 mg/dL   Creatinine, Ser 89.86 (H) 0.61 - 1.24 mg/dL   Calcium  9.0 8.9 - 10.3 mg/dL   Total Protein 6.5 6.5 - 8.1 g/dL   Albumin  3.0 (L) 3.5 - 5.0 g/dL   AST 12 (L) 15 - 41 U/L   ALT 7 0 - 44 U/L   Alkaline Phosphatase 53 38 - 126 U/L   Total Bilirubin 1.1 0.0 - 1.2 mg/dL   GFR, Estimated 5 (L) >60 mL/min   Anion gap 16 (H) 5 - 15  CBG monitoring, ED    Collection Time: 06/02/24  7:29 AM  Result Value Ref Range   Glucose-Capillary 80 70 - 99 mg/dL  Potassium   Collection Time: 06/02/24  7:42 AM  Result Value Ref Range   Potassium 6.3 (HH) 3.5 - 5.1 mmol/L  Glucose, capillary   Collection Time: 06/02/24 11:20 AM  Result Value Ref Range   Glucose-Capillary 79 70 - 99 mg/dL     KYM Louder, MD [facility]   06/02/2024  2:33 AM How to contact the [facility] Attending or Consulting provider 7A - 7P or covering provider during after hours 7P -7A, for this patient?  Check the care team in [facility] and look for a) attending/consulting [facility] provider listed and b) the [facility] team listed Log into [email or web address] and use [place]'s universal password to access. If you  do not have the password, please contact the hospital operator. Locate the [facility] provider you are looking for under [facility] and page to a number that you can be directly reached. If you still have difficulty reaching the provider, please page the [facility] (Director on Call) for the Hospitalists listed on amion for assistance.

## 2024-06-02 NOTE — Hospital Course (Signed)
 63 y.o. male with medical history significant for hypertension, type 2 diabetes mellitus, COPD, ESRD on hemodialysis, recent CVA, and chronic pain who presents with lethargy and generalized weakness after a fall at home.    Patient fell at home last night at approximately 8:30 PM per report of his sister who encouraged him to call EMS but he refused.  He was then found sleeping on the floor at 2 AM and EMS was called.  Patient reports that he had been fatigued, feeling generally weak, slipped and fell, and was unable to get up on his own.  He denies any chest pain, palpitations, focal weakness, fever, or chills.   Patient states that he was last dialyzed on 05/27/2024 and has missed his last 2 sessions.   He had a similar presentation 4 weeks ago, was instructed to discontinue morphine  at that time, but continues to take 30 mg morphine  sulfate IR twice daily.   ED Course: Upon arrival to the ED, patient is found to be afebrile and saturating mid 90s on room air with normal RR, normal HR, and stable BP.  EKG demonstrates sinus rhythm with LAFB.  There are no acute findings on head CT or cervical spine CT.  Labs are most notable for potassium 6.4, bicarbonate 18, BUN 90, anion gap 16, normal WBC, and hemoglobin 9.3.   Nephrology (Dr. Norine) was consulted by the ED physician and recommended hospital admission and administration of Lokelma .  Lokelma  was given in the ED.

## 2024-06-02 NOTE — H&P (Signed)
 History and Physical    Curtis Hartman FMW:[id number] DOB: 27-Feb-1961 DOA: 06/02/2024  PCP: Gerome Tillman LITTIE, FNP   Patient coming from: Home   Chief Complaint: Fall, lethargy, generalized weakness   HPI: Curtis Hartman is a 63 y.o. male with medical history significant for hypertension, type 2 diabetes mellitus, COPD, ESRD on hemodialysis, recent CVA, and chronic pain who presents with lethargy and generalized weakness after a fall at home.   Patient fell at home last night at approximately 8:30 PM per report of his sister who encouraged him to call EMS but he refused.  He was then found sleeping on the floor at 2 AM and EMS was called.  Patient reports that he had been fatigued, feeling generally weak, slipped and fell, and was unable to get up on his own.  He denies any chest pain, palpitations, focal weakness, fever, or chills.  Patient states that he was last dialyzed on 05/27/2024 and has missed his last 2 sessions.  He had a similar presentation 4 weeks ago, was instructed to discontinue morphine  at that time, but continues to take 30 mg morphine  sulfate IR twice daily.  ED Course: Upon arrival to the ED, patient is found to be afebrile and saturating mid 90s on room air with normal RR, normal HR, and stable BP.  EKG demonstrates sinus rhythm with LAFB.  There are no acute findings on head CT or cervical spine CT.  Labs are most notable for potassium 6.4, bicarbonate 18, BUN 90, anion gap 16, normal WBC, and hemoglobin 9.3.  Nephrology (Dr. Norine) was consulted by the ED physician and recommended hospital admission and administration of Lokelma .  Lokelma  was given in the ED.  Review of Systems:  All other systems reviewed and apart from HPI, are negative.  Past Medical History:  Diagnosis Date   Arthritis    CKD (chronic kidney disease)    COPD (chronic obstructive pulmonary disease) (HCC)    Diabetes mellitus without complication (HCC)    type 2   GERD (gastroesophageal  reflux disease)    Headache    Hx Migraines   Hepatitis    Hypertension    Neuropathy     Past Surgical History:  Procedure Laterality Date   ANTERIOR CERVICAL DECOMP/DISCECTOMY FUSION N/A 11/05/2022   Procedure: Anterior Cervical Decompression/discectomy Fusion Cervical Four-Cervical Five;  Surgeon: Debby Dorn MATSU, MD;  Location: [facility] OR;  Service: Neurosurgery;  Laterality: N/A;  3C   ANTERIOR LAT LUMBAR FUSION N/A 09/01/2023   Procedure: DIRECT LUMBAR INTERBODY FUSION, LUMBAR TWO-LUMBAR THREE, LUMBAR THREE-LUMBAR FOUR, LUMBAR FOUR-LUMBAR FIVE, RIGHT PRONE TRANSPSOAS, LUMBAR TWO LUMBAR FIVE POSTERIOR PERCUTANEOUS FUSION LUMBAR FOUR-LUMBAR FIVE MINIMALLY INVASIVE LAMINECTOMY, FORAMINOTOMY;  Surgeon: Debby Dorn MATSU, MD;  Location: [facility] OR;  Service: Neurosurgery;  Laterality: N/A;   AV FISTULA PLACEMENT Left 09/10/2023   Procedure: LEFT ARM ARTERIOVENOUS (AV) FISTULA CREATION;  Surgeon: Pearline Norman RAMAN, MD;  Location: [facility] OR;  Service: Vascular;  Laterality: Left;   BACK SURGERY  2023   Cervical Spine   BIOPSY  03/03/2022   Procedure: BIOPSY;  Surgeon: Cindie Carlin POUR, DO;  Location: [facility] END[facility] SUITE;  Service: Endoscopy;;   COLONOSCOPY WITH PROPOFOL  N/A 03/03/2022   Procedure: COLONOSCOPY WITH PROPOFOL ;  Surgeon: Cindie Carlin POUR, DO;  Location: [facility]ITE;  Service: Endoscopy;  Laterality: N/A;  8:00am   ESOPHAGOGASTRODUODENOSCOPY (EGD) WITH PROPOFOL  N/A 03/03/2022   Procedure: ESOPHAGOGASTRODUODENOSCOPY (EGD) WITH PROPOFOL ;  Surgeon: Cindie Carlin POUR, DO;  Location: [facility];  Service: Endoscopy;  Laterality: N/A;   foot right partial toe amputation Right    FOOT SURGERY Right    x3   hernia surgery x4     LUMBAR PERCUTANEOUS PEDICLE SCREW 3 LEVEL N/A 09/01/2023   Procedure: L2-5 LUMBAR PERCUTANEOUS PEDICLE SCREW 3 LEVEL;  Surgeon: Debby Dorn MATSU, MD;  Location: [facility] OR;  Service: Neurosurgery;  Laterality: N/A;   POLYPECTOMY  03/03/2022   Procedure: POLYPECTOMY;   Surgeon: Cindie Carlin POUR, DO;  Location: [facility];  Service: Endoscopy;;    Social History:   reports that he has been smoking cigarettes. He has never been exposed to tobacco smoke. He has never used smokeless tobacco. He reports that he does not currently use alcohol. He reports that he does not currently use drugs.  No Known Allergies  Family History  Problem Relation Age of Onset   Heart disease Mother    Seizures Father    Stroke Maternal Grandfather    Liver disease Paternal Grandmother      Prior to Admission medications   Medication Sig Start Date End Date Taking? Authorizing Provider  acetaminophen  (TYLENOL ) 325 MG tablet Take 1-2 tablets (325-650 mg total) by mouth every 4 (four) hours as needed for mild pain (pain score 1-3). 09/24/23   Setzer, Sandra J, PA-C  albuterol  (PROAIR  HFA) 108 (90 Base) MCG/ACT inhaler 2 puffs every 4 hours as needed only  if your can't catch your breath 06/10/23   Darlean Ozell NOVAK, MD  amLODipine  (NORVASC ) 10 MG tablet Take 10 mg by mouth daily as needed (diastolic blood pressure above 90).    [provider]  aspirin  81 MG chewable tablet Chew 1 tablet (81 mg total) by mouth daily. 05/10/24 05/10/25  Vicci Afton CROME, MD  Budeson-Glycopyrrol-Formoterol  (BREZTRI  AEROSPHERE) 160-9-4.8 MCG/ACT AERO Inhale 2 puffs into the lungs in the morning and at bedtime. Patient taking differently: Inhale 2 puffs into the lungs 2 (two) times daily as needed (shortness of breath). 06/10/23   Darlean Ozell NOVAK, MD  carvedilol  (COREG ) 6.25 MG tablet Take 1 tablet (6.25 mg total) by mouth 2 (two) times daily with a meal. 05/11/24   Tat, Alm, MD  diphenoxylate -atropine  (LOMOTIL ) 2.5-0.025 MG tablet Take 1 tablet by mouth 4 (four) times daily.    [provider]  fluticasone  (FLONASE ) 50 MCG/ACT nasal spray Place 2 sprays into both nostrils daily. 09/24/23   Setzer, Sandra J, PA-C  furosemide  (LASIX ) 80 MG tablet Take 80 mg by mouth daily as needed for  edema or fluid. 01/20/24   [provider]  hydrOXYzine  (ATARAX ) 25 MG tablet Take 25 mg by mouth daily. 04/21/24   [provider]  LANTUS  100 UNIT/ML injection Inject 5 Units into the skin at bedtime as needed (blood sugar over 150). 03/05/21   [provider]  oxybutynin  (DITROPAN -XL) 10 MG 24 hr tablet Take 10 mg by mouth daily. 02/23/24   [provider]    Physical Exam: Vitals:   06/02/24 0240 06/02/24 0244 06/02/24 0245  BP: 119/87  125/64  Pulse: 82  80  Resp: 16  10  Temp: 98.2 F (36.8 C)    TempSrc: Oral    SpO2: 93%  95%  Weight:  72.6 kg   Height:  5' 9 (1.753 m)     Constitutional: NAD, no pallor or diaphoresis   Eyes: PERTLA, lids and conjunctivae normal ENMT: Mucous membranes are moist. Posterior pharynx clear of any exudate or lesions.   Neck: supple, no  masses  Respiratory: no wheezing, no crackles. No accessory muscle use.  Cardiovascular: S1 & S2 heard, regular rate and rhythm. Bilateral lower extremity edema.  Abdomen: No distension, no tenderness, soft. Bowel sounds active.  Musculoskeletal: no clubbing / cyanosis. S/p toe amputations.   Skin: no significant rashes, lesions, ulcers. Warm, dry, well-perfused. Neurologic: CN 2-12 grossly intact. Sensation to light touch intact. Strength 5/5 in all 4 limbs. Sleeping, wakes to voice and is oriented to person, place, and situation.   Psychiatric: Calm. Cooperative.    Labs and Imaging on Admission: I have personally reviewed following labs and imaging studies  CBC: Recent Labs  Lab 06/02/24 0251  WBC 9.2  NEUTROABS 6.8  HGB 9.3*  HCT 29.3*  MCV 94.8  PLT 278   Basic Metabolic Panel: Recent Labs  Lab 06/02/24 0251  NA 142  K 6.4*  CL 108  CO2 18*  GLUCOSE 89  BUN 90*  CREATININE 10.13*  CALCIUM  9.0   GFR: Estimated Creatinine Clearance: 7.5 mL/min (A) (by C-G formula based on SCr of 10.13 mg/dL (H)). Liver Function Tests: Recent Labs  Lab 06/02/24 0251   AST 12*  ALT 7  ALKPHOS 53  BILITOT 1.1  PROT 6.5  ALBUMIN  3.0*   No results for input(s): LIPASE, AMYLASE in the last 168 hours. No results for input(s): AMMONIA in the last 168 hours. Coagulation Profile: No results for input(s): INR, PROTIME in the last 168 hours. Cardiac Enzymes: Recent Labs  Lab 06/02/24 0251  CKTOTAL 197   BNP (last 3 results) No results for input(s): PROBNP in the last 8760 hours. HbA1C: No results for input(s): HGBA1C in the last 72 hours. CBG: No results for input(s): GLUCAP in the last 168 hours. Lipid Profile: No results for input(s): CHOL, HDL, LDLCALC, TRIG, CHOLHDL, LDLDIRECT in the last 72 hours. Thyroid Function Tests: No results for input(s): TSH, T4TOTAL, FREET4, T3FREE, THYROIDAB in the last 72 hours. Anemia Panel: No results for input(s): VITAMINB12, FOLATE, FERRITIN, TIBC, IRON , RETICCTPCT in the last 72 hours. Urine analysis: No results found for: COLORURINE, APPEARANCEUR, LABSPEC, PHURINE, GLUCOSEU, HGBUR, BILIRUBINUR, KETONESUR, PROTEINUR, UROBILINOGEN, NITRITE, LEUKOCYTESUR Sepsis Labs: @LABRCNTIP (procalcitonin:4,lacticidven:4) )No results found for this or any previous visit (from the past 240 hours).   Radiological Exams on Admission: DG Shoulder Right Result Date: 06/02/2024 CLINICAL DATA:  Recent fall with right shoulder pain, initial encounter EXAM: RIGHT SHOULDER - 2+ VIEW COMPARISON:  None Available. FINDINGS: Mild degenerative changes of the acromioclavicular joint are seen. Humeral head is high-riding likely related to chronic rotator cuff injury. Some remodeling of the acromion is seen. No acute fracture or dislocation is noted. IMPRESSION: Humeral head likely related to chronic rotator cuff injury. Electronically Signed   By: Oneil Devonshire M.D.   On: 06/02/2024 03:45   CT Head Wo Contrast Result Date: 06/02/2024 CLINICAL DATA:  Fall yesterday with  headaches and neck pain, initial encounter EXAM: CT HEAD WITHOUT CONTRAST CT CERVICAL SPINE WITHOUT CONTRAST TECHNIQUE: Multidetector CT imaging of the head and cervical spine was performed following the standard protocol without intravenous contrast. Multiplanar CT image reconstructions of the cervical spine were also generated. RADIATION DOSE REDUCTION: This exam was performed according to the departmental dose-optimization program which includes automated exposure control, adjustment of the mA and/or kV according to patient size and/or use of iterative reconstruction technique. COMPARISON:  None Available. FINDINGS: CT HEAD FINDINGS Brain: No evidence of acute infarction, hemorrhage, hydrocephalus, extra-axial collection or mass lesion/mass effect. Mild chronic white matter ischemic changes  are noted. Vascular: No hyperdense vessel or unexpected calcification. Skull: Normal. Negative for fracture or focal lesion. Sinuses/Orbits: No acute finding. Other: None. CT CERVICAL SPINE FINDINGS Alignment: Within normal limits. Skull base and vertebrae: 7 cervical segments are well visualized. Prior fusion at C4-5 is noted with anterior fixation. Posterior fixation extending from C4 to T1 is noted bilaterally. The odontoid is within normal limits. No acute fracture or acute facet abnormality is noted. Multilevel osteophytic change and facet hypertrophic changes are noted. Soft tissues and spinal canal: Surrounding soft tissue structures are within normal limits. Upper chest: Visualized lung apices are within normal limits. Other: None IMPRESSION: CT of the head: Chronic white matter ischemic changes. No acute abnormality noted. CT of cervical spine: Changes of prior cervical fusion are seen. Mild degenerative changes are noted. No acute abnormality noted. Electronically Signed   By: Oneil Devonshire M.D.   On: 06/02/2024 03:36   CT Cervical Spine Wo Contrast Result Date: 06/02/2024 CLINICAL DATA:  Fall yesterday with  headaches and neck pain, initial encounter EXAM: CT HEAD WITHOUT CONTRAST CT CERVICAL SPINE WITHOUT CONTRAST TECHNIQUE: Multidetector CT imaging of the head and cervical spine was performed following the standard protocol without intravenous contrast. Multiplanar CT image reconstructions of the cervical spine were also generated. RADIATION DOSE REDUCTION: This exam was performed according to the departmental dose-optimization program which includes automated exposure control, adjustment of the mA and/or kV according to patient size and/or use of iterative reconstruction technique. COMPARISON:  None Available. FINDINGS: CT HEAD FINDINGS Brain: No evidence of acute infarction, hemorrhage, hydrocephalus, extra-axial collection or mass lesion/mass effect. Mild chronic white matter ischemic changes are noted. Vascular: No hyperdense vessel or unexpected calcification. Skull: Normal. Negative for fracture or focal lesion. Sinuses/Orbits: No acute finding. Other: None. CT CERVICAL SPINE FINDINGS Alignment: Within normal limits. Skull base and vertebrae: 7 cervical segments are well visualized. Prior fusion at C4-5 is noted with anterior fixation. Posterior fixation extending from C4 to T1 is noted bilaterally. The odontoid is within normal limits. No acute fracture or acute facet abnormality is noted. Multilevel osteophytic change and facet hypertrophic changes are noted. Soft tissues and spinal canal: Surrounding soft tissue structures are within normal limits. Upper chest: Visualized lung apices are within normal limits. Other: None IMPRESSION: CT of the head: Chronic white matter ischemic changes. No acute abnormality noted. CT of cervical spine: Changes of prior cervical fusion are seen. Mild degenerative changes are noted. No acute abnormality noted. Electronically Signed   By: Oneil Devonshire M.D.   On: 06/02/2024 03:36    EKG: Independently reviewed. Sinus rhythm, LAFB.   Assessment/Plan   1. Hyperkalemia; ESRD   - Nephrology consulted by ED physician and Lokelma  given    - Continue cardiac monitoring, repeat potassium level, restrict fluids, renally-dose mediations    2. Acute encephalopathy  - Remains somnolent in ED but wakes to voice answers basic questions appropriately  - No acute findings on head CT  - Stop morphine , treat metabolic derangements, use delirium precautions  3. COPD  - Not in exacerbation   - Continue ICS-LAMA-LABA and as-needed SABA    4. Hypertension  - Coreg     5. Type II DM  - Check CBGs and use low-intensity SSI if needed    6. Anemia  - Hgb is 9.3, down from 11.5 earlier this month  - No overt bleeding, will trend H&H   7. Chronic pain  - He was prescribed 50 tabs of morphine  sulfate IR 30 mg  on 05/27/24  - Morphine  should be avoided in ESRD patients, particularly given his recurrent presentations with encephalopathy     DVT prophylaxis: sq heparin   Code Status: Full  Level of Care: Level of care: Telemetry Family Communication: None present  Disposition Plan:  Patient is from: home  Anticipated d/c is to: TBD Anticipated d/c date is: 06/04/24  Patient currently: Pending treatment of hyperkalemia   Consults called: nephrology  Admission status: Inpatient     Evalene GORMAN Sprinkles, MD [facility]  06/02/2024, 4:31 AM

## 2024-06-02 NOTE — ED Notes (Signed)
 Pt resting, difficult to arouse. Once awake pt is alert and oriented x4.

## 2024-06-02 NOTE — TOC Initial Note (Signed)
 Transition of Care [facility]) - Initial/Assessment Note    Patient Details  Name: Curtis Hartman MRN: [id number] Date of Birth: 01-Sep-1961  Transition of Care [facility] CM/SW Contact:    Curtis LITTIE Coffee, RN Phone Number: 06/02/2024, 10:18 AM  Clinical Narrative:                 Pt assessed for high readmission risk. Admitted c/hyperkalemia. Pt is from home, lives c/his younger sister and her boyfriend. He is independent c/ADLs, uses a rollator for ambulation and his insurance provider arranges transportation to medical appointments and dialysis. Pt states transportation is reliable. Pt states he missed dialysis yesterday due to a conflicting medical appointment. Pt plans to return home at dc c/his sister providing transportation.   Expected Discharge Plan: Home/Self Care Barriers to Discharge: Continued Medical Work up   Patient Goals and CMS Choice Patient states their goals for this hospitalization and ongoing recovery are:: Return home          Expected Discharge Plan and Services In-house Referral: Clinical Social Work Discharge Planning Services: CM Consult   Living arrangements for the past 2 months: Single Family Home                                      Prior Living Arrangements/Services Living arrangements for the past 2 months: Single Family Home Lives with:: Siblings Patient language and need for interpreter reviewed:: Yes Do you feel safe going back to the place where you live?: Yes      Need for Family Participation in Patient Care: Yes (Comment) Care giver support system in place?: Yes (comment)   Criminal Activity/Legal Involvement Pertinent to Current Situation/Hospitalization: No - Comment as needed  Activities of Daily Living   ADL Screening (condition at time of admission) Independently performs ADLs?: Yes (appropriate for developmental age) Is the patient deaf or have difficulty hearing?: No Does the patient have difficulty seeing, even when  wearing glasses/contacts?: No Does the patient have difficulty concentrating, remembering, or making decisions?: No  Permission Sought/Granted                  Emotional Assessment Appearance:: Appears stated age Attitude/Demeanor/Rapport: Engaged Affect (typically observed): Calm Orientation: : Oriented to Self, Oriented to Place, Oriented to  Time, Oriented to Situation Alcohol / Substance Use: Not Applicable Psych Involvement: No (comment)  Admission diagnosis:  Hyperkalemia [E87.5] Contusion of right shoulder, initial encounter [S40.011A] Closed head injury, initial encounter [S09.90XA] Strain of neck muscle, initial encounter [S16.1XXA] Syncope, unspecified syncope type [R55] Patient Active Problem List   Diagnosis Date Noted   Hyperkalemia 06/02/2024   Normocytic anemia 06/02/2024   Perianal abscess 05/07/2024   History of recent stroke 05/06/2024   Essential hypertension 05/05/2024   Type 2 diabetes mellitus with peripheral neuropathy (HCC) 05/05/2024   Chronic obstructive pulmonary disease (COPD) (HCC) 05/05/2024   End-stage renal disease on hemodialysis (HCC) 05/05/2024   Acute encephalopathy 05/04/2024   Coping style affecting medical condition 09/17/2023   Lumbar stenosis 09/11/2023   Lumbar spinal stenosis 09/01/2023   COPD GOLD ? 06/10/2023   Cigarette smoker 06/10/2023   S/P cervical spinal fusion 11/05/2022   Chronic hepatitis C (HCC) 06/25/2022   IBS (irritable bowel syndrome) 06/25/2022   GERD (gastroesophageal reflux disease) 06/25/2022   Impaired mobility and ADLs 11/05/2021   CKD (chronic kidney disease) stage 3, GFR 30-59 ml/min (HCC) 10/29/2021   Hypertension  10/29/2021   T2DM (type 2 diabetes mellitus) (HCC) 10/29/2021   Cervical myelopathy with cervical radiculopathy (HCC) 09/23/2021   PCP:  Gerome Tillman CROME, FNP Pharmacy:   [facility] [phone] - [place], [place] - [address] AT [facility] [address] [place] [address] Phone: [phone] Fax: [phone]     Social Drivers of Health (SDOH) Social History: SDOH Screenings   Food Insecurity: No Food Insecurity (06/02/2024)  Housing: Low Risk  (06/02/2024)  Transportation Needs: No Transportation Needs (06/02/2024)  Utilities: Not At Risk (06/02/2024)  Financial Resource Strain: High Risk (01/15/2024)   Received from [facility]  Physical Activity: Unknown (01/15/2024)   Received from [facility]  Social Connections: Socially Isolated (06/02/2024)  Stress: Stress Concern Present (02/29/2024)   Received from [facility]  Tobacco Use: High Risk (06/02/2024)   SDOH Interventions:     Readmission Risk Interventions    06/02/2024   10:15 AM 05/09/2024   11:05 AM  Readmission Risk Prevention Plan  Transportation Screening Complete Complete  PCP or Specialist Appt within 3-5 Days Complete   HRI or Home Care Consult Complete Complete  Social Work Consult for Recovery Care Planning/Counseling Complete Complete  Palliative Care Screening Not Applicable Not Applicable  Medication Review Oceanographer) Complete Complete

## 2024-06-02 NOTE — Procedures (Signed)
 HD Note:  Some information was entered later than the data was gathered due to patient care needs. The stated time with the data is accurate.  Received patient in bed to unit.   Alert and oriented.   Informed consent signed and in chart.   Access used: Upper left arm fistula Access issues: Arterial pressures higher than tolerated at times.  BFR reduced to 350  Patient tolerated treatment well.  There were episodes of hypotension that were resolved by lowering UF goal and 100 ml bolus.  Dr. Dolan notified.  Patient denied feeling uncomfortable during these episodes.  No signs or symptoms of distress.  TX duration: 3.5 hours  Alert, without acute distress.  Total UF removed: 1400  Hand-off given to patient's nurse.   Transported back to the room   Braedan Meuth L. Lenon, RN Kidney Dialysis Unit.

## 2024-06-02 NOTE — Consult Note (Signed)
  [facility]phrology Consult Note: Reason for Consult: To manage dialysis and dialysis related needs Referring Physician: Dr. Vicci, [place]  HPI:  Curtis Hartman is an 63 y.o. male with past medical history significant for hypertension, type 2 diabetes, dyslipidemia, stroke, ESRD on HD who was presented after the fall associated with lethargy and generalized weakness, seen as a consultation for the management of ESRD. It seems like the patient had a fall last night at home therefore EMS was called.  In the ER, he was afebrile, blood pressure acceptable, and room air.  The labs showed potassium level of 6.4, CO2 18, BUN 90, hemoglobin 9.3.  CT of head with chronic ischemic changes but no acute finding noted.  Patient is quite somnolent this morning therefore unable to obtain review of system from him.  It seems like he has missed dialysis yesterday, usually he is Monday Wednesday Friday schedule.  He is being admitted to internal medicine service for further management.  Outpatient Dialysis Orders from recent hospitalization:  [facility] [place], MWF, [place] 17H Time 4:00, edw 62.5kg, LUE AVF BFR 350, DFR 600, 2K/2.5Ca bath Heparin  1000 units bolus then 1000 units/hr Venofer  50 mg IV once a week  Past Medical History:  Diagnosis Date   Arthritis    CKD (chronic kidney disease)    COPD (chronic obstructive pulmonary disease) (HCC)    Diabetes mellitus without complication (HCC)    type 2   GERD (gastroesophageal reflux disease)    Headache    Hx Migraines   Hepatitis    Hypertension    Neuropathy     Past Surgical History:  Procedure Laterality Date   ANTERIOR CERVICAL DECOMP/DISCECTOMY FUSION N/A 11/05/2022   Procedure: Anterior Cervical Decompression/discectomy Fusion Cervical Four-Cervical Five;  Surgeon: Debby Dorn MATSU, MD;  Location: [facility] OR;  Service: Neurosurgery;  Laterality: N/A;  3C   ANTERIOR LAT LUMBAR FUSION N/A 09/01/2023   Procedure: DIRECT LUMBAR  INTERBODY FUSION, LUMBAR TWO-LUMBAR THREE, LUMBAR THREE-LUMBAR FOUR, LUMBAR FOUR-LUMBAR FIVE, RIGHT PRONE TRANSPSOAS, LUMBAR TWO LUMBAR FIVE POSTERIOR PERCUTANEOUS FUSION LUMBAR FOUR-LUMBAR FIVE MINIMALLY INVASIVE LAMINECTOMY, FORAMINOTOMY;  Surgeon: Debby Dorn MATSU, MD;  Location: [facility] OR;  Service: Neurosurgery;  Laterality: N/A;   AV FISTULA PLACEMENT Left 09/10/2023   Procedure: LEFT ARM ARTERIOVENOUS (AV) FISTULA CREATION;  Surgeon: Pearline Norman RAMAN, MD;  Location: [facility] OR;  Service: Vascular;  Laterality: Left;   BACK SURGERY  2023   Cervical Spine   BIOPSY  03/03/2022   Procedure: BIOPSY;  Surgeon: Cindie Carlin POUR, DO;  Location: [facility] END[facility] SUITE;  Service: Endoscopy;;   COLONOSCOPY WITH PROPOFOL  N/A 03/03/2022   Procedure: COLONOSCOPY WITH PROPOFOL ;  Surgeon: Cindie Carlin POUR, DO;  Location: [facility];  Service: Endoscopy;  Laterality: N/A;  8:00am   ESOPHAGOGASTRODUODENOSCOPY (EGD) WITH PROPOFOL  N/A 03/03/2022   Procedure: ESOPHAGOGASTRODUODENOSCOPY (EGD) WITH PROPOFOL ;  Surgeon: Cindie Carlin POUR, DO;  Location: [facility];  Service: Endoscopy;  Laterality: N/A;   foot right partial toe amputation Right    FOOT SURGERY Right    x3   hernia surgery x4     LUMBAR PERCUTANEOUS PEDICLE SCREW 3 LEVEL N/A 09/01/2023   Procedure: L2-5 LUMBAR PERCUTANEOUS PEDICLE SCREW 3 LEVEL;  Surgeon: Debby Dorn MATSU, MD;  Location: [facility] OR;  Service: Neurosurgery;  Laterality: N/A;   POLYPECTOMY  03/03/2022   Procedure: POLYPECTOMY;  Surgeon: Cindie Carlin POUR, DO;  Location: [facility];  Service: Endoscopy;;    Family History  Problem Relation Age of  Onset   Heart disease Mother    Seizures Father    Stroke Maternal Grandfather    Liver disease Paternal Grandmother     Social History:  reports that he has been smoking cigarettes. He has never been exposed to tobacco smoke. He has never used smokeless tobacco. He reports that he does not currently use alcohol. He reports that he does not  currently use drugs.  Allergies: No Known Allergies  Medications: I have reviewed the patient's current medications.   Results for orders placed or performed during the hospital encounter of 06/02/24 (from the past 48 hours)  CK     Status: None   Collection Time: 06/02/24  2:51 AM  Result Value Ref Range   Total CK 197 49 - 397 U/L    Comment: Performed at [facility], [address], [place], [place] [address]  CBC with Differential     Status: Abnormal   Collection Time: 06/02/24  2:51 AM  Result Value Ref Range   WBC 9.2 4.0 - 10.5 K/uL   RBC 3.09 (L) 4.22 - 5.81 MIL/uL   Hemoglobin 9.3 (L) 13.0 - 17.0 g/dL   HCT 70.6 (L) 60.9 - 47.9 %   MCV 94.8 80.0 - 100.0 fL   MCH 30.1 26.0 - 34.0 pg   MCHC 31.7 30.0 - 36.0 g/dL   RDW 85.6 88.4 - 84.4 %   Platelets 278 150 - 400 K/uL   nRBC 0.0 0.0 - 0.2 %   Neutrophils Relative % 74 %   Neutro Abs 6.8 1.7 - 7.7 K/uL   Lymphocytes Relative 13 %   Lymphs Abs 1.2 0.7 - 4.0 K/uL   Monocytes Relative 10 %   Monocytes Absolute 0.9 0.1 - 1.0 K/uL   Eosinophils Relative 2 %   Eosinophils Absolute 0.2 0.0 - 0.5 K/uL   Basophils Relative 1 %   Basophils Absolute 0.1 0.0 - 0.1 K/uL   Immature Granulocytes 0 %   Abs Immature Granulocytes 0.03 0.00 - 0.07 K/uL    Comment: Performed at [facility], [address], [place], [place] [address]  Comprehensive metabolic panel     Status: Abnormal   Collection Time: 06/02/24  2:51 AM  Result Value Ref Range   Sodium 142 135 - 145 mmol/L   Potassium 6.4 (HH) 3.5 - 5.1 mmol/L    Comment: CRITICAL RESULT CALLED TO, READ BACK BY AND VERIFIED WITH K. NICHOLS [phone], VIRAY,J   Chloride 108 98 - 111 mmol/L   CO2 18 (L) 22 - 32 mmol/L   Glucose, Bld 89 70 - 99 mg/dL    Comment: Glucose reference range applies only to samples taken after fasting for at least 8 hours.   BUN 90 (H) 8 - 23 mg/dL   Creatinine, Ser 89.86 (H) 0.61 - 1.24 mg/dL   Calcium  9.0 8.9 - 10.3 mg/dL   Total Protein 6.5 6.5 -  8.1 g/dL   Albumin  3.0 (L) 3.5 - 5.0 g/dL   AST 12 (L) 15 - 41 U/L   ALT 7 0 - 44 U/L   Alkaline Phosphatase 53 38 - 126 U/L   Total Bilirubin 1.1 0.0 - 1.2 mg/dL   GFR, Estimated 5 (L) >60 mL/min    Comment: (NOTE) Calculated using the CKD-EPI Creatinine Equation (2021)    Anion gap 16 (H) 5 - 15    Comment: Performed at [facility], [address] [place], [place] [address]  CBG monitoring, ED  Status: None   Collection Time: 06/02/24  7:29 AM  Result Value Ref Range   Glucose-Capillary 80 70 - 99 mg/dL    Comment: Glucose reference range applies only to samples taken after fasting for at least 8 hours.  Potassium     Status: Abnormal   Collection Time: 06/02/24  7:42 AM  Result Value Ref Range   Potassium 6.3 (HH) 3.5 - 5.1 mmol/L    Comment: CRITICAL VALUE NOTED. VALUE IS CONSISTENT WITH PREVIOUSLY REPORTED/CALLED VALUE Performed at [facility], [address] [place], [place] [address]     DG Shoulder Right Result Date: 06/02/2024 CLINICAL DATA:  Recent fall with right shoulder pain, initial encounter EXAM: RIGHT SHOULDER - 2+ VIEW COMPARISON:  None Available. FINDINGS: Mild degenerative changes of the acromioclavicular joint are seen. Humeral head is high-riding likely related to chronic rotator cuff injury. Some remodeling of the acromion is seen. No acute fracture or dislocation is noted. IMPRESSION: Humeral head likely related to chronic rotator cuff injury. Electronically Signed   By: Oneil Devonshire M.D.   On: 06/02/2024 03:45   CT Head Wo Contrast Result Date: 06/02/2024 CLINICAL DATA:  Fall yesterday with headaches and neck pain, initial encounter EXAM: CT HEAD WITHOUT CONTRAST CT CERVICAL SPINE WITHOUT CONTRAST TECHNIQUE: Multidetector CT imaging of the head and cervical spine was performed following the standard protocol without intravenous contrast. Multiplanar CT image reconstructions of the cervical spine were also generated. RADIATION DOSE REDUCTION: This exam was  performed according to the departmental dose-optimization program which includes automated exposure control, adjustment of the mA and/or kV according to patient size and/or use of iterative reconstruction technique. COMPARISON:  None Available. FINDINGS: CT HEAD FINDINGS Brain: No evidence of acute infarction, hemorrhage, hydrocephalus, extra-axial collection or mass lesion/mass effect. Mild chronic white matter ischemic changes are noted. Vascular: No hyperdense vessel or unexpected calcification. Skull: Normal. Negative for fracture or focal lesion. Sinuses/Orbits: No acute finding. Other: None. CT CERVICAL SPINE FINDINGS Alignment: Within normal limits. Skull base and vertebrae: 7 cervical segments are well visualized. Prior fusion at C4-5 is noted with anterior fixation. Posterior fixation extending from C4 to T1 is noted bilaterally. The odontoid is within normal limits. No acute fracture or acute facet abnormality is noted. Multilevel osteophytic change and facet hypertrophic changes are noted. Soft tissues and spinal canal: Surrounding soft tissue structures are within normal limits. Upper chest: Visualized lung apices are within normal limits. Other: None IMPRESSION: CT of the head: Chronic white matter ischemic changes. No acute abnormality noted. CT of cervical spine: Changes of prior cervical fusion are seen. Mild degenerative changes are noted. No acute abnormality noted. Electronically Signed   By: Oneil Devonshire M.D.   On: 06/02/2024 03:36   CT Cervical Spine Wo Contrast Result Date: 06/02/2024 CLINICAL DATA:  Fall yesterday with headaches and neck pain, initial encounter EXAM: CT HEAD WITHOUT CONTRAST CT CERVICAL SPINE WITHOUT CONTRAST TECHNIQUE: Multidetector CT imaging of the head and cervical spine was performed following the standard protocol without intravenous contrast. Multiplanar CT image reconstructions of the cervical spine were also generated. RADIATION DOSE REDUCTION: This exam was  performed according to the departmental dose-optimization program which includes automated exposure control, adjustment of the mA and/or kV according to patient size and/or use of iterative reconstruction technique. COMPARISON:  None Available. FINDINGS: CT HEAD FINDINGS Brain: No evidence of acute infarction, hemorrhage, hydrocephalus, extra-axial collection or mass lesion/mass effect. Mild chronic white matter ischemic changes are noted. Vascular: No hyperdense vessel or unexpected  calcification. Skull: Normal. Negative for fracture or focal lesion. Sinuses/Orbits: No acute finding. Other: None. CT CERVICAL SPINE FINDINGS Alignment: Within normal limits. Skull base and vertebrae: 7 cervical segments are well visualized. Prior fusion at C4-5 is noted with anterior fixation. Posterior fixation extending from C4 to T1 is noted bilaterally. The odontoid is within normal limits. No acute fracture or acute facet abnormality is noted. Multilevel osteophytic change and facet hypertrophic changes are noted. Soft tissues and spinal canal: Surrounding soft tissue structures are within normal limits. Upper chest: Visualized lung apices are within normal limits. Other: None IMPRESSION: CT of the head: Chronic white matter ischemic changes. No acute abnormality noted. CT of cervical spine: Changes of prior cervical fusion are seen. Mild degenerative changes are noted. No acute abnormality noted. Electronically Signed   By: Oneil Devonshire M.D.   On: 06/02/2024 03:36    ROS: Unable to obtain review of system as patient is somnolent. Blood pressure (!) 143/76, pulse 73, temperature 98 F (36.7 C), temperature source Oral, resp. rate 14, height 5' 9 (1.753 m), weight 72.6 kg, SpO2 100%. Gen: NAD, comfortable Respiratory: Clear bilateral, no wheezing or crackle Cardiovascular: Regular rate rhythm S1-S2 normal, no rubs GI: Abdomen soft, nontender, nondistended Extremities, no cyanosis or clubbing, no edema Skin: No rash or  ulcer Neurology: Somnolent male. Dialysis Access: AV fistula has a good thrill and bruit.  Assessment/Plan:  # Hyperkalemia in dialysis patient: Received medical treatment in ER.  Plan for HD today.  # ESRD MWF at [facility]: Missed HD yesterday therefore plan for dialysis today and regular dialysis tomorrow.  AV fistula for the access.  # Hypertension/volume: BP acceptable.  Continue home antihypertensives and UF with HD.  # Anemia of ESRD: Hemoglobin 9.3, we will try to obtain outpatient record about erythropoietin treatment.  # CKD-metabolic Bone Disease: Resume phosphorus binders, follow lab.  Thank you for the consult, we will continue to follow.  Brynna Dobos Amelie Romney 06/02/2024, 8:28 AM

## 2024-06-02 NOTE — ED Provider Notes (Signed)
  EMERGENCY DEPARTMENT AT [facility] Provider Note   CSN: [id number] Arrival date & time: 06/02/24  9770     Patient presents with: Curtis Hartman is a 63 y.o. male.   Patient is a 63 year old male with past medical history of end-stage renal disease on hemodialysis, irritable bowel, GERD, type 2 diabetes, hypertension.  Patient brought by EMS after some sort of fall versus syncopal episode that occurred at home.  Patient states that he was standing 1 minute, the next minute his sister was standing over him waking him up.  He is uncertain as to what transpired, but is complaining of discomfort in his neck and right shoulder.  Patient also tells me that he has had transportation issues and a doctor's appointment that have prevented him from being dialyzed the last 2 sessions.  His last dialysis session was this past Friday (today is now Thursday).       Prior to Admission medications   Medication Sig Start Date End Date Taking? Authorizing Provider  acetaminophen  (TYLENOL ) 325 MG tablet Take 1-2 tablets (325-650 mg total) by mouth every 4 (four) hours as needed for mild pain (pain score 1-3). 09/24/23   Setzer, Sandra J, PA-C  albuterol  (PROAIR  HFA) 108 (90 Base) MCG/ACT inhaler 2 puffs every 4 hours as needed only  if your can't catch your breath 06/10/23   Darlean Ozell NOVAK, MD  amLODipine  (NORVASC ) 10 MG tablet Take 10 mg by mouth daily as needed (diastolic blood pressure above 90).    [provider]  amoxicillin -clavulanate (AUGMENTIN ) 500-125 MG tablet Take 1 tablet by mouth daily. 05/11/24   Evonnie Lenis, MD  aspirin  81 MG chewable tablet Chew 1 tablet (81 mg total) by mouth daily. 05/10/24 05/10/25  Johnson, Clanford L, MD  Budeson-Glycopyrrol-Formoterol  (BREZTRI  AEROSPHERE) 160-9-4.8 MCG/ACT AERO Inhale 2 puffs into the lungs in the morning and at bedtime. Patient taking differently: Inhale 2 puffs into the lungs 2 (two) times daily as needed (shortness  of breath). 06/10/23   Darlean Ozell NOVAK, MD  carvedilol  (COREG ) 6.25 MG tablet Take 1 tablet (6.25 mg total) by mouth 2 (two) times daily with a meal. 05/11/24   Tat, Lenis, MD  diphenoxylate -atropine  (LOMOTIL ) 2.5-0.025 MG tablet Take 1 tablet by mouth 4 (four) times daily.    [provider]  fluticasone  (FLONASE ) 50 MCG/ACT nasal spray Place 2 sprays into both nostrils daily. 09/24/23   Setzer, Sandra J, PA-C  furosemide  (LASIX ) 80 MG tablet Take 80 mg by mouth daily as needed for edema or fluid. 01/20/24   [provider]  hydrOXYzine  (ATARAX ) 25 MG tablet Take 25 mg by mouth daily. 04/21/24   [provider]  LANTUS  100 UNIT/ML injection Inject 5 Units into the skin at bedtime as needed (blood sugar over 150). 03/05/21   [provider]  oxybutynin  (DITROPAN -XL) 10 MG 24 hr tablet Take 10 mg by mouth daily. 02/23/24   [provider]    Allergies: Patient has no known allergies.    Review of Systems  All other systems reviewed and are negative.   Updated Vital Signs BP 125/64   Pulse 80   Temp 98.2 F (36.8 C) (Oral)   Resp 10   Ht 5' 9 (1.753 m)   Wt 72.6 kg   SpO2 95%   BMI 23.63 kg/m   Physical Exam Vitals and nursing note reviewed.  Constitutional:      General: He is not in acute distress.  Appearance: He is well-developed. He is not diaphoretic.  HENT:     Head: Normocephalic and atraumatic.  Cardiovascular:     Rate and Rhythm: Normal rate and regular rhythm.     Heart sounds: No murmur heard.    No friction rub.  Pulmonary:     Effort: Pulmonary effort is normal. No respiratory distress.     Breath sounds: Normal breath sounds. No wheezing or rales.  Abdominal:     General: Bowel sounds are normal. There is no distension.     Palpations: Abdomen is soft.     Tenderness: There is no abdominal tenderness.  Musculoskeletal:        General: Normal range of motion.     Cervical back: Normal range of motion and neck supple.      Comments: There is tenderness to the top of the right shoulder.  There is no obvious deformity.  He has pain with range of motion.  Ulnar radial pulses are easily palpable and motor and sensation are intact throughout the entire hand.  Skin:    General: Skin is warm and dry.  Neurological:     Mental Status: He is alert and oriented to person, place, and time.     Coordination: Coordination normal.     (all labs ordered are listed, but only abnormal results are displayed) Labs Reviewed  CBC WITH DIFFERENTIAL/PLATELET - Abnormal; Notable for the following components:      Result Value   RBC 3.09 (*)    Hemoglobin 9.3 (*)    HCT 29.3 (*)    All other components within normal limits  CK  COMPREHENSIVE METABOLIC PANEL WITH GFR    EKG: None  Radiology: No results found.   Procedures   Medications Ordered in the ED - No data to display                                  Medical Decision Making Amount and/or Complexity of Data Reviewed Labs: ordered. Radiology: ordered.  Risk Prescription drug management. Decision regarding hospitalization.   Patient is a 63 year old male with end-stage renal disease on hemodialysis presenting after a syncopal episode and having missed his dialysis for the past 2 sessions.  He has not been dialyzed in 6 days.  Patient arrives here with stable vital signs and is afebrile.  He is neurologically intact.  Only complaints are neck and right shoulder pain.  Laboratory studies obtained including CBC and metabolic panel.  Results show a baseline anemia of 9.3.  Metabolic panel shows potassium of 6.4 and CO2 of 18.  BUN and creatinine are 98 and 10.1.  CT scan of the head and cervical spine obtained showing no acute process.  X-ray of the shoulder reveals no acute fracture, but shows chronic changes consistent with prior rotator cuff injury.  Care discussed with Dr. Norine from nephrology.  She recommends Lokelma  and admission for dialysis.  I  spoken with the hospitalist who agrees to admit.  CRITICAL CARE Performed by: Vicenta Able Total critical care time: 35 minutes Critical care time was exclusive of separately billable procedures and treating other patients. Critical care was necessary to treat or prevent imminent or life-threatening deterioration. Critical care was time spent personally by me on the following activities: development of treatment plan with patient and/or surrogate as well as nursing, discussions with consultants, evaluation of patient's response to treatment, examination of patient, obtaining history from  patient or surrogate, ordering and performing treatments and interventions, ordering and review of laboratory studies, ordering and review of radiographic studies, pulse oximetry and re-evaluation of patient's condition.      Final diagnoses:  None    ED Discharge Orders     None          Geroldine Berg, MD 06/02/24 [phone]

## 2024-06-02 NOTE — Plan of Care (Signed)
 Problem: Education: Goal: Ability to describe self-care measures that may prevent or decrease complications (Diabetes Survival Skills Education) will improve 06/02/2024 1559 by Woodard Deidre CROME, RN Outcome: Progressing 06/02/2024 1559 by Woodard Deidre CROME, RN Outcome: Progressing Goal: Individualized Educational Video(s) 06/02/2024 1559 by Woodard Deidre CROME, RN Outcome: Progressing 06/02/2024 1559 by Woodard Deidre CROME, RN Outcome: Progressing   Problem: Coping: Goal: Ability to adjust to condition or change in health will improve 06/02/2024 1559 by Woodard Deidre CROME, RN Outcome: Progressing 06/02/2024 1559 by Woodard Deidre CROME, RN Outcome: Progressing   Problem: Fluid Volume: Goal: Ability to maintain a balanced intake and output will improve 06/02/2024 1559 by Woodard Deidre CROME, RN Outcome: Progressing 06/02/2024 1559 by Woodard Deidre CROME, RN Outcome: Progressing   Problem: Metabolic: Goal: Ability to maintain appropriate glucose levels will improve 06/02/2024 1559 by Woodard Deidre CROME, RN Outcome: Progressing 06/02/2024 1559 by Woodard Deidre CROME, RN Outcome: Progressing   Problem: Nutritional: Goal: Maintenance of adequate nutrition will improve 06/02/2024 1559 by Woodard Deidre CROME, RN Outcome: Progressing 06/02/2024 1559 by Woodard Deidre CROME, RN Outcome: Progressing Goal: Progress toward achieving an optimal weight will improve 06/02/2024 1559 by Woodard Deidre CROME, RN Outcome: Progressing 06/02/2024 1559 by Woodard Deidre CROME, RN Outcome: Progressing   Problem: Skin Integrity: Goal: Risk for impaired skin integrity will decrease 06/02/2024 1559 by Woodard Deidre CROME, RN Outcome: Progressing 06/02/2024 1559 by Woodard Deidre CROME, RN Outcome: Progressing   Problem: Tissue Perfusion: Goal: Adequacy of tissue perfusion will improve 06/02/2024 1559 by Woodard Deidre CROME, RN Outcome: Progressing 06/02/2024 1559 by Woodard Deidre CROME, RN Outcome: Progressing    Problem: Education: Goal: Knowledge of General Education information will improve Description: Including pain rating scale, medication(s)/side effects and non-pharmacologic comfort measures 06/02/2024 1559 by Woodard Deidre CROME, RN Outcome: Progressing 06/02/2024 1559 by Woodard Deidre CROME, RN Outcome: Progressing   Problem: Health Behavior/Discharge Planning: Goal: Ability to manage health-related needs will improve 06/02/2024 1559 by Woodard Deidre CROME, RN Outcome: Progressing 06/02/2024 1559 by Woodard Deidre CROME, RN Outcome: Progressing   Problem: Clinical Measurements: Goal: Ability to maintain clinical measurements within normal limits will improve 06/02/2024 1559 by Woodard Deidre CROME, RN Outcome: Progressing 06/02/2024 1559 by Woodard Deidre CROME, RN Outcome: Progressing Goal: Will remain free from infection 06/02/2024 1559 by Woodard Deidre CROME, RN Outcome: Progressing 06/02/2024 1559 by Woodard Deidre CROME, RN Outcome: Progressing Goal: Diagnostic test results will improve 06/02/2024 1559 by Woodard Deidre CROME, RN Outcome: Progressing 06/02/2024 1559 by Woodard Deidre CROME, RN Outcome: Progressing Goal: Respiratory complications will improve 06/02/2024 1559 by Woodard Deidre CROME, RN Outcome: Progressing 06/02/2024 1559 by Woodard Deidre CROME, RN Outcome: Progressing Goal: Cardiovascular complication will be avoided 06/02/2024 1559 by Woodard Deidre CROME, RN Outcome: Progressing 06/02/2024 1559 by Woodard Deidre CROME, RN Outcome: Progressing   Problem: Activity: Goal: Risk for activity intolerance will decrease 06/02/2024 1559 by Woodard Deidre CROME, RN Outcome: Not Progressing 06/02/2024 1559 by Woodard Deidre CROME, RN Outcome: Progressing   Problem: Nutrition: Goal: Adequate nutrition will be maintained 06/02/2024 1559 by Woodard Deidre CROME, RN Outcome: Progressing 06/02/2024 1559 by Woodard Deidre CROME, RN Outcome: Progressing   Problem: Coping: Goal: Level of anxiety will  decrease 06/02/2024 1559 by Woodard Deidre CROME, RN Outcome: Progressing 06/02/2024 1559 by Woodard Deidre CROME, RN Outcome: Progressing   Problem: Elimination: Goal: Will not experience complications related to bowel motility 06/02/2024 1559 by Woodard Deidre CROME, RN Outcome: Progressing 06/02/2024 1559 by Woodard Deidre CROME, RN Outcome: Progressing Goal: Will not  experience complications related to urinary retention 06/02/2024 1559 by Woodard Deidre CROME, RN Outcome: Progressing 06/02/2024 1559 by Woodard Deidre CROME, RN Outcome: Progressing   Problem: Pain Managment: Goal: General experience of comfort will improve and/or be controlled 06/02/2024 1559 by Woodard Deidre CROME, RN Outcome: Progressing 06/02/2024 1559 by Woodard Deidre CROME, RN Outcome: Progressing   Problem: Safety: Goal: Ability to remain free from injury will improve 06/02/2024 1559 by Woodard Deidre CROME, RN Outcome: Progressing 06/02/2024 1559 by Woodard Deidre CROME, RN Outcome: Progressing   Problem: Skin Integrity: Goal: Risk for impaired skin integrity will decrease 06/02/2024 1559 by Woodard Deidre CROME, RN Outcome: Progressing 06/02/2024 1559 by Woodard Deidre CROME, RN Outcome: Progressing

## 2024-06-02 NOTE — Progress Notes (Signed)
 Pt receives OP HD [facility], MWF, 11:00 chair time. Clinic confirmed noncompliance with treatments. Will assist as needed.   Lavanda Evelyne Makepeace Dialysis navigator [phone]  [place] phone #- [phone]

## 2024-06-02 NOTE — ED Triage Notes (Addendum)
 Pt fell at 2030 yesterday evening. Sister tried to get him to call EMS but he denied. Pt was still found in the floor at 0200 asleep. Pt states he does not remember what happened between walking into the house and EMS arriving. Pt not on blood thinners. Complains of generalized back pain and neck pain. Pt stated he last took his morphine  2 hours ago and did not get dialysis yesterday.

## 2024-06-02 NOTE — ED Notes (Signed)
 Pt oxygen declined while sleeping. Nasal cannula applied 2 lpm to keep oxygen above 90%.

## 2024-06-03 DIAGNOSIS — N186 End stage renal disease: Secondary | ICD-10-CM | POA: Diagnosis not present

## 2024-06-03 DIAGNOSIS — G9340 Encephalopathy, unspecified: Secondary | ICD-10-CM | POA: Diagnosis not present

## 2024-06-03 DIAGNOSIS — D649 Anemia, unspecified: Secondary | ICD-10-CM | POA: Diagnosis not present

## 2024-06-03 DIAGNOSIS — E875 Hyperkalemia: Secondary | ICD-10-CM | POA: Diagnosis not present

## 2024-06-03 LAB — RENAL FUNCTION PANEL
Albumin: 2.9 g/dL — ABNORMAL LOW (ref 3.5–5.0)
Anion gap: 15 (ref 5–15)
BUN: 56 mg/dL — ABNORMAL HIGH (ref 8–23)
CO2: 26 mmol/L (ref 22–32)
Calcium: 8.9 mg/dL (ref 8.9–10.3)
Chloride: 97 mmol/L — ABNORMAL LOW (ref 98–111)
Creatinine, Ser: 6.82 mg/dL — ABNORMAL HIGH (ref 0.61–1.24)
GFR, Estimated: 8 mL/min — ABNORMAL LOW (ref >60)
Glucose, Bld: 105 mg/dL — ABNORMAL HIGH (ref 70–99)
Phosphorus: 7.9 mg/dL — ABNORMAL HIGH (ref 2.5–4.6)
Potassium: 4.9 mmol/L (ref 3.5–5.1)
Sodium: 138 mmol/L (ref 135–145)

## 2024-06-03 LAB — CBC
HCT: 34.6 % — ABNORMAL LOW (ref 39.0–52.0)
Hemoglobin: 11 g/dL — ABNORMAL LOW (ref 13.0–17.0)
MCH: 29.8 pg (ref 26.0–34.0)
MCHC: 31.8 g/dL (ref 30.0–36.0)
MCV: 93.8 fL (ref 80.0–100.0)
Platelets: 313 K/uL (ref 150–400)
RBC: 3.69 MIL/uL — ABNORMAL LOW (ref 4.22–5.81)
RDW: 14.1 % (ref 11.5–15.5)
WBC: 8.5 K/uL (ref 4.0–10.5)
nRBC: 0 % (ref 0.0–0.2)

## 2024-06-03 LAB — GLUCOSE, CAPILLARY
Glucose-Capillary: 110 mg/dL — ABNORMAL HIGH (ref 70–99)
Glucose-Capillary: 112 mg/dL — ABNORMAL HIGH (ref 70–99)
Glucose-Capillary: 113 mg/dL — ABNORMAL HIGH (ref 70–99)
Glucose-Capillary: 137 mg/dL — ABNORMAL HIGH (ref 70–99)
Glucose-Capillary: 176 mg/dL — ABNORMAL HIGH (ref 70–99)
Glucose-Capillary: 193 mg/dL — ABNORMAL HIGH (ref 70–99)
Glucose-Capillary: 57 mg/dL — ABNORMAL LOW (ref 70–99)

## 2024-06-03 LAB — HEPATITIS B SURFACE ANTIBODY, QUANTITATIVE: Hep B S AB Quant (Post): <3.5 m[IU]/mL — ABNORMAL LOW

## 2024-06-03 MED ORDER — DIPHENOXYLATE-ATROPINE 2.5-0.025 MG PO TABS: 1.0000 | ORAL_TABLET | Freq: Four times a day (QID) | ORAL | Status: AC | PRN

## 2024-06-03 MED ORDER — HYDROMORPHONE HCL 2 MG PO TABS
2.0000 mg | ORAL_TABLET | ORAL | Status: DC | PRN
  Administered 2024-06-03 – 2024-06-08 (×10): 2 mg via ORAL
  Filled 2024-06-03 (×11): qty 1

## 2024-06-03 MED ORDER — OXYCODONE HCL 5 MG PO TABS
5.0000 mg | ORAL_TABLET | Freq: Four times a day (QID) | ORAL | Status: DC | PRN
  Administered 2024-06-04 – 2024-06-08 (×5): 5 mg via ORAL
  Filled 2024-06-03 (×5): qty 1

## 2024-06-03 MED ORDER — OXYCODONE HCL 5 MG PO TABS: 5.0000 mg | ORAL_TABLET | Freq: Four times a day (QID) | ORAL | 0 refills | Status: DC | PRN

## 2024-06-03 MED ORDER — SENNA 8.6 MG PO TABS: 1.0000 | ORAL_TABLET | Freq: Every day | ORAL | 0 refills | Status: DC | PRN

## 2024-06-03 MED ORDER — DEXTROSE 50 % IV SOLN
INTRAVENOUS | Status: AC
  Administered 2024-06-03: 50 mL
  Filled 2024-06-03: qty 50

## 2024-06-03 NOTE — Progress Notes (Signed)
 Patient moved from ICU to 311. AS usual breztri  inhaler misplaced on move. After searching Pt's room and ICU have not found inhaler. After retrieving New Breztri  and returing to give patient. Patients phone rings and he asked if therapist can return in 10 minutes. Treatment not given by me. Med placed in 311 med drawer.

## 2024-06-03 NOTE — NC FL2 (Signed)
 [place]  MEDICAID FL2 LEVEL OF CARE FORM     IDENTIFICATION  Patient Name: Curtis Hartman Birthdate: 09/13/1961 Sex: male Admission Date (Current Location): 06/02/2024  [facility] and [place] Number:  [employer] and Address:  [facility],  [address] [place] [address]      Provider Number: [id number]  Attending Physician Name and Address:  Vicci Afton LITTIE, MD  Relative Name and Phone Number:  Ellwyn Ergle ( sister ) [phone]    Current Level of Care: Hospital Recommended Level of Care: Skilled Nursing Facility Prior Approval Number:    Date Approved/Denied:   PASRR Number: [id number]  Discharge Plan: SNF    Current Diagnoses: Patient Active Problem List   Diagnosis Date Noted   Hyperkalemia 06/02/2024   Normocytic anemia 06/02/2024   Perianal abscess 05/07/2024   History of recent stroke 05/06/2024   Essential hypertension 05/05/2024   Type 2 diabetes mellitus with peripheral neuropathy (HCC) 05/05/2024   Chronic obstructive pulmonary disease (COPD) (HCC) 05/05/2024   End-stage renal disease on hemodialysis (HCC) 05/05/2024   Acute encephalopathy 05/04/2024   Coping style affecting medical condition 09/17/2023   Lumbar stenosis 09/11/2023   Lumbar spinal stenosis 09/01/2023   COPD GOLD ? 06/10/2023   Cigarette smoker 06/10/2023   S/P cervical spinal fusion 11/05/2022   Chronic hepatitis C (HCC) 06/25/2022   IBS (irritable bowel syndrome) 06/25/2022   GERD (gastroesophageal reflux disease) 06/25/2022   Impaired mobility and ADLs 11/05/2021   CKD (chronic kidney disease) stage 3, GFR 30-59 ml/min (HCC) 10/29/2021   Hypertension 10/29/2021   T2DM (type 2 diabetes mellitus) (HCC) 10/29/2021   Cervical myelopathy with cervical radiculopathy (HCC) 09/23/2021    Orientation RESPIRATION BLADDER Height & Weight     Self, Time, Situation, Place  Normal Continent Weight: 136 lb 14.5 oz (62.1 kg) Height:  5' 9 (175.3 cm)   BEHAVIORAL SYMPTOMS/MOOD NEUROLOGICAL BOWEL NUTRITION STATUS      Continent Diet (See DC summary)  AMBULATORY STATUS COMMUNICATION OF NEEDS Skin   Extensive Assist Verbally Bruising, Skin abrasions                       Personal Care Assistance Level of Assistance  Bathing, Feeding, Dressing Bathing Assistance: Limited assistance Feeding assistance: Independent Dressing Assistance: Limited assistance     Functional Limitations Info  Sight, Hearing, Speech Sight Info: Impaired (eye glasses)        SPECIAL CARE FACTORS FREQUENCY  PT (By licensed PT), OT (By licensed OT)     PT Frequency: 5 x a week OT Frequency: 5 x a week            Contractures Contractures Info: Not present    Additional Factors Info  Code Status, Insulin  Sliding Scale, Allergies Code Status Info: FULL Allergies Info: NKA   Insulin  Sliding Scale Info: insulin  aspart (novoLOG ) injection 0-6 Units  Dose: 0-6 Units  Freq: 3 times daily with meals Route: [place]  Start: 06/02/24 0800  Admin Instructions:  Do NOT hold if patient is NPO.  Order specific questions:  Correction coverage: Very Sensitive (ESRD/Dialysis)  CBG < 70: Implement Hypoglycemia Standing Orders and refer to Hypoglycemia Standing Orders sidebar report  CBG 70 - 120: 0 units  CBG 121 - 150: 0 units  CBG 151 - 200: 1 unit  CBG 201-250: 2 units  CBG 251-300: 3 units  CBG 301-350: 4 units  CBG 351-400: 5 units  CBG > 400 Give 6 units  and call MD   ;   insulin  aspart (novoLOG ) injection 0-5 Units  Dose: 0-5 Units  Freq: Daily at bedtime Route: [place]  Start: 06/02/24 2200  Admin Instructions:  Do NOT hold insulin  if patient is NPO.  Order specific questions:  Correction coverage: HS scale  CBG < 70: Implement Hypoglycemia Standing Orders and refer to Hypoglycemia Standing Orders sidebar report  CBG 70 - 120: 0 units  CBG 121 - 150: 0 units  CBG 151 - 200: 0 units  CBG 201 - 250: 2 units  CBG 251 - 300: 3 units  CBG 301 - 350: 4 units  CBG 351 - 400: 5  units  CBG > 400 call MD and obtain STAT lab verification       Current Medications (06/03/2024):  This is the current hospital active medication list Current Facility-Administered Medications  Medication Dose Route Frequency Provider Last Rate Last Admin   acetaminophen  (TYLENOL ) tablet 650 mg  650 mg Oral Q6H PRN Opyd, Timothy S, MD   650 mg at 06/03/24 9184   Or   acetaminophen  (TYLENOL ) suppository 650 mg  650 mg Rectal Q6H PRN Opyd, Timothy S, MD       albuterol  (PROVENTIL ) (2.5 MG/3ML) 0.083% nebulizer solution 2.5 mg  2.5 mg Nebulization Q6H PRN Opyd, Timothy S, MD       aspirin  chewable tablet 81 mg  81 mg Oral Daily Opyd, Timothy S, MD   81 mg at 06/03/24 9093   budesonide -glycopyrrolate -formoterol  (BREZTRI ) 160-9-4.8 MCG/ACT inhaler 2 puff  2 puff Inhalation BID Vicci Pen L, MD   2 puff at 06/03/24 9261   carvedilol  (COREG ) tablet 6.25 mg  6.25 mg Oral BID WC Opyd, Timothy S, MD   6.25 mg at 06/03/24 9196   Chlorhexidine  Gluconate Cloth 2 % PADS 6 each  6 each Topical Q0600 Dolan Mateo Larger, MD   6 each at 06/03/24 9388   heparin  injection 5,000 Units  5,000 Units Subcutaneous Q8H Opyd, Timothy S, MD   5,000 Units at 06/03/24 9388   HYDROmorphone  (DILAUDID ) tablet 2 mg  2 mg Oral Q4H PRN Vicci, Clanford L, MD   2 mg at 06/03/24 1052   insulin  aspart (novoLOG ) injection 0-5 Units  0-5 Units Subcutaneous QHS Opyd, Timothy S, MD       insulin  aspart (novoLOG ) injection 0-6 Units  0-6 Units Subcutaneous TID WC Opyd, Timothy S, MD   1 Units at 06/03/24 1139   oxyCODONE  (Oxy IR/ROXICODONE ) immediate release tablet 5 mg  5 mg Oral Q6H PRN Johnson, Clanford L, MD       pentafluoroprop-tetrafluoroeth (GEBAUERS) aerosol 1 Application  1 Application Topical PRN Bhandari, Dron Prasad, MD       prochlorperazine  (COMPAZINE ) injection 5 mg  5 mg Intravenous Q6H PRN Opyd, Timothy S, MD       senna (SENOKOT) tablet 8.6 mg  1 tablet Oral Daily PRN Opyd, Timothy S, MD       sodium chloride   flush (NS) 0.9 % injection 3 mL  3 mL Intravenous Q12H Opyd, Timothy S, MD   3 mL at 06/03/24 0906     Discharge Medications: Please see discharge summary for a list of discharge medications.  Relevant Imaging Results:  Relevant Lab Results:   Additional Information SSN: [id number]  Noreen KATHEE Pinal, LCSWA

## 2024-06-03 NOTE — Plan of Care (Signed)
  Problem: Acute Rehab OT Goals (only OT should resolve) Goal: Pt. Will Perform Grooming Flowsheets (Taken 06/03/2024 1410) Pt Will Perform Grooming: with modified independence Goal: Pt. Will Perform Lower Body Bathing Flowsheets (Taken 06/03/2024 1410) Pt Will Perform Lower Body Bathing: with modified independence Goal: Pt. Will Perform Lower Body Dressing Flowsheets (Taken 06/03/2024 1410) Pt Will Perform Lower Body Dressing: with modified independence Goal: Pt. Will Perform Toileting-Clothing Manipulation Flowsheets (Taken 06/03/2024 1410) Pt Will Perform Toileting - Clothing Manipulation and hygiene: with modified independence Goal: Pt/Caregiver Will Perform Home Exercise Program Flowsheets (Taken 06/03/2024 1410) Pt/caregiver will Perform Home Exercise Program:  Increased ROM  Increased strength  Independently  Loray Akard OT, MOT

## 2024-06-03 NOTE — Progress Notes (Addendum)
 D/c order noted. Contacted HD clinic to be advised of pt arrival next Monday.    Adaysha Dubinsky Dialysis navigator [phone]  Addendum 12:19 Faxed last note over to clinic, will send d/c summary when available.   Addendum 2:00 D/c summary faxed to clinic.

## 2024-06-03 NOTE — Plan of Care (Signed)
  Problem: Acute Rehab PT Goals(only PT should resolve) Goal: Pt Will Go Supine/Side To Sit Outcome: Progressing Flowsheets (Taken 06/03/2024 1404) Pt will go Supine/Side to Sit: with supervision Goal: Patient Will Transfer Sit To/From Stand Outcome: Progressing Flowsheets (Taken 06/03/2024 1404) Patient will transfer sit to/from stand: with supervision Goal: Pt Will Transfer Bed To Chair/Chair To Bed Outcome: Progressing Flowsheets (Taken 06/03/2024 1404) Pt will Transfer Bed to Chair/Chair to Bed: with supervision Goal: Pt Will Ambulate Outcome: Progressing Flowsheets (Taken 06/03/2024 1404) Pt will Ambulate:  with supervision  25 feet  with rolling walker Goal: Pt Will Go Up/Down Stairs Outcome: Progressing Flowsheets (Taken 06/03/2024 1404) Pt will Go Up / Down Stairs:  1-2 stairs  with contact guard assist  with rail(s)    2:05 PM, 06/03/24 Donavon Kimrey Powell-Butler, PT, DPT [place] with [facility]

## 2024-06-03 NOTE — Discharge Summary (Signed)
 Physician Discharge Summary  Curtis Hartman FMW:[id number] DOB: 09-Jul-1961 DOA: 06/02/2024  PCP: Gerome Tillman LITTIE, FNP  Admit date: 06/02/2024 Discharge date: 06/03/2024  Admitted From:  Home  Disposition: Home   Recommendations for Outpatient Follow-up:  Follow up with PCP in 1 weeks Resume regular outpatient dialysis schedule and STOP missing treatments PLEASE AVOID MORPHINE  - this is toxic for him due to his ESRD Alternative pain medications could be dilaudid , fentanyl , oxycodone   Discharge Condition: STABLE   CODE STATUS: FULL DIET: renal diet recommended   Brief Hospitalization Summary: Please see all hospital notes, images, labs for full details of the hospitalization. Admission provider HPI:   63 y.o. male with medical history significant for hypertension, type 2 diabetes mellitus, COPD, ESRD on hemodialysis, recent CVA, and chronic pain who presents with lethargy and generalized weakness after a fall at home.    Patient fell at home last night at approximately 8:30 PM per report of his sister who encouraged him to call EMS but he refused.  He was then found sleeping on the floor at 2 AM and EMS was called.  Patient reports that he had been fatigued, feeling generally weak, slipped and fell, and was unable to get up on his own.  He denies any chest pain, palpitations, focal weakness, fever, or chills.   Patient states that he was last dialyzed on 05/27/2024 and has missed his last 2 sessions.   He had a similar presentation 4 weeks ago, was instructed to discontinue morphine  at that time, but continues to take 30 mg morphine  sulfate IR twice daily.   ED Course: Upon arrival to the ED, patient is found to be afebrile and saturating mid 90s on room air with normal RR, normal HR, and stable BP.  EKG demonstrates sinus rhythm with LAFB.  There are no acute findings on head CT or cervical spine CT.  Labs are most notable for potassium 6.4, bicarbonate 18, BUN 90, anion gap 16, normal WBC,  and hemoglobin 9.3.   Nephrology (Dr. Norine) was consulted by the ED physician and recommended hospital admission and administration of Lokelma .  Lokelma  was given in the ED.  Hospital Course by listed problems addressed  1. Hyperkalemia; ESRD  - Nephrology consulted by ED physician and Lokelma  given    - pt had missed multiple HD treatments outpaitent and was treated with HD in hospital and potassium improved to 4.9 - discussed with Dr. Dolan with nephrology no further HD in hospital and he can resume outpatient HD on Monday     2. Acute encephalopathy  - secondary to missed HD and ongoing usage of morphine  which has been discontinued - He is back to his baseline mentation after HD, discussed with nephrologist ok to discharge today - No acute findings on head CT  - Stop morphine  and do not resume, counseled with patient and he verbalized understanding  3. COPD  - Not in exacerbation   - Continue ICS-LAMA-LABA and as-needed SABA     4. Hypertension  - Coreg      5. Type II DM  - CBGs have been stable, resume home treatment plan CBG (last 3)  Recent Labs    06/03/24 0429 06/03/24 0739 06/03/24 1115  GLUCAP 110* 112* 193*      6. Anemia of CKD  - Hgb is 9.3, down from 11.5 earlier this month  - No overt bleeding   7. Chronic pain  - He was prescribed 50 tabs of morphine  sulfate IR 30 mg  on 05/27/24  - Morphine  should be avoided in ESRD patients, particularly given his recurrent presentations with encephalopathy   - Pt was counseled at length to avoid morphine  and he verbalized understanding - oxycodone  5 mg ordered PRN for severe pain only, follow up with his PCP who is prescribing his pain management.      Discharge Diagnoses:  Principal Problem:   Hyperkalemia Active Problems:   Hypertension   Acute encephalopathy   Type 2 diabetes mellitus with peripheral neuropathy (HCC)   Chronic obstructive pulmonary disease (COPD) (HCC)   End-stage renal disease on  hemodialysis (HCC)   Normocytic anemia   Discharge Instructions:  Allergies as of 06/03/2024   No Known Allergies      Medication List     STOP taking these medications    hydrOXYzine  25 MG tablet Commonly known as: ATARAX    morphine  30 MG tablet Commonly known as: MSIR       TAKE these medications    acetaminophen  325 MG tablet Commonly known as: TYLENOL  Take 1-2 tablets (325-650 mg total) by mouth every 4 (four) hours as needed for mild pain (pain score 1-3).   albuterol  108 (90 Base) MCG/ACT inhaler Commonly known as: ProAir  HFA 2 puffs every 4 hours as needed only  if your can't catch your breath   amLODipine  10 MG tablet Commonly known as: NORVASC  Take 10 mg by mouth daily as needed (diastolic blood pressure above 90).   aspirin  81 MG chewable tablet Chew 1 tablet (81 mg total) by mouth daily.   Breztri  Aerosphere 160-9-4.8 MCG/ACT Aero inhaler Generic drug: budesonide -glycopyrrolate -formoterol  Inhale 2 puffs into the lungs in the morning and at bedtime. What changed:  when to take this reasons to take this   carvedilol  6.25 MG tablet Commonly known as: COREG  Take 1 tablet (6.25 mg total) by mouth 2 (two) times daily with a meal.   diphenoxylate -atropine  2.5-0.025 MG tablet Commonly known as: LOMOTIL  Take 1 tablet by mouth 4 (four) times daily as needed for diarrhea or loose stools. What changed:  when to take this reasons to take this   fluticasone  50 MCG/ACT nasal spray Commonly known as: FLONASE  Place 2 sprays into both nostrils daily.   furosemide  80 MG tablet Commonly known as: LASIX  Take 80 mg by mouth daily as needed for edema or fluid.   Lantus  100 UNIT/ML injection Generic drug: insulin  glargine Inject 5 Units into the skin at bedtime as needed (blood sugar over 150).   LORazepam 0.5 MG tablet Commonly known as: ATIVAN Take 0.5 mg by mouth.   oxybutynin  10 MG 24 hr tablet Commonly known as: DITROPAN -XL Take 10 mg by mouth  daily.   oxyCODONE  5 MG immediate release tablet Commonly known as: Oxy IR/ROXICODONE  Take 1 tablet (5 mg total) by mouth every 6 (six) hours as needed for up to 5 days for severe pain (pain score 7-10).   senna 8.6 MG Tabs tablet Commonly known as: SENOKOT Take 1 tablet (8.6 mg total) by mouth daily as needed for mild constipation or moderate constipation.        Follow-up Information     Gerome Tillman CROME, FNP Follow up in 1 week(s).   Specialty: Family Medicine Why: Hospital Follow Up Contact information: [address] [place] [address] [phone]         Hemodialysis. Go on 06/03/2024.   Why: Continue regular outpatient schedule for dialysis and stop missing treatments  No Known Allergies Allergies as of 06/03/2024   No Known Allergies      Medication List     STOP taking these medications    hydrOXYzine  25 MG tablet Commonly known as: ATARAX    morphine  30 MG tablet Commonly known as: MSIR       TAKE these medications    acetaminophen  325 MG tablet Commonly known as: TYLENOL  Take 1-2 tablets (325-650 mg total) by mouth every 4 (four) hours as needed for mild pain (pain score 1-3).   albuterol  108 (90 Base) MCG/ACT inhaler Commonly known as: ProAir  HFA 2 puffs every 4 hours as needed only  if your can't catch your breath   amLODipine  10 MG tablet Commonly known as: NORVASC  Take 10 mg by mouth daily as needed (diastolic blood pressure above 90).   aspirin  81 MG chewable tablet Chew 1 tablet (81 mg total) by mouth daily.   Breztri  Aerosphere 160-9-4.8 MCG/ACT Aero inhaler Generic drug: budesonide -glycopyrrolate -formoterol  Inhale 2 puffs into the lungs in the morning and at bedtime. What changed:  when to take this reasons to take this   carvedilol  6.25 MG tablet Commonly known as: COREG  Take 1 tablet (6.25 mg total) by mouth 2 (two) times daily with a meal.   diphenoxylate -atropine  2.5-0.025 MG tablet Commonly known  as: LOMOTIL  Take 1 tablet by mouth 4 (four) times daily as needed for diarrhea or loose stools. What changed:  when to take this reasons to take this   fluticasone  50 MCG/ACT nasal spray Commonly known as: FLONASE  Place 2 sprays into both nostrils daily.   furosemide  80 MG tablet Commonly known as: LASIX  Take 80 mg by mouth daily as needed for edema or fluid.   Lantus  100 UNIT/ML injection Generic drug: insulin  glargine Inject 5 Units into the skin at bedtime as needed (blood sugar over 150).   LORazepam 0.5 MG tablet Commonly known as: ATIVAN Take 0.5 mg by mouth.   oxybutynin  10 MG 24 hr tablet Commonly known as: DITROPAN -XL Take 10 mg by mouth daily.   oxyCODONE  5 MG immediate release tablet Commonly known as: Oxy IR/ROXICODONE  Take 1 tablet (5 mg total) by mouth every 6 (six) hours as needed for up to 5 days for severe pain (pain score 7-10).   senna 8.6 MG Tabs tablet Commonly known as: SENOKOT Take 1 tablet (8.6 mg total) by mouth daily as needed for mild constipation or moderate constipation.        Procedures/Studies: DG Shoulder Right Result Date: 06/02/2024 CLINICAL DATA:  Recent fall with right shoulder pain, initial encounter EXAM: RIGHT SHOULDER - 2+ VIEW COMPARISON:  None Available. FINDINGS: Mild degenerative changes of the acromioclavicular joint are seen. Humeral head is high-riding likely related to chronic rotator cuff injury. Some remodeling of the acromion is seen. No acute fracture or dislocation is noted. IMPRESSION: Humeral head likely related to chronic rotator cuff injury. Electronically Signed   By: Oneil Devonshire M.D.   On: 06/02/2024 03:45   CT Head Wo Contrast Result Date: 06/02/2024 CLINICAL DATA:  Fall yesterday with headaches and neck pain, initial encounter EXAM: CT HEAD WITHOUT CONTRAST CT CERVICAL SPINE WITHOUT CONTRAST TECHNIQUE: Multidetector CT imaging of the head and cervical spine was performed following the standard protocol without  intravenous contrast. Multiplanar CT image reconstructions of the cervical spine were also generated. RADIATION DOSE REDUCTION: This exam was performed according to the departmental dose-optimization program which includes automated exposure control, adjustment of the mA and/or kV according to patient size and/or  use of iterative reconstruction technique. COMPARISON:  None Available. FINDINGS: CT HEAD FINDINGS Brain: No evidence of acute infarction, hemorrhage, hydrocephalus, extra-axial collection or mass lesion/mass effect. Mild chronic white matter ischemic changes are noted. Vascular: No hyperdense vessel or unexpected calcification. Skull: Normal. Negative for fracture or focal lesion. Sinuses/Orbits: No acute finding. Other: None. CT CERVICAL SPINE FINDINGS Alignment: Within normal limits. Skull base and vertebrae: 7 cervical segments are well visualized. Prior fusion at C4-5 is noted with anterior fixation. Posterior fixation extending from C4 to T1 is noted bilaterally. The odontoid is within normal limits. No acute fracture or acute facet abnormality is noted. Multilevel osteophytic change and facet hypertrophic changes are noted. Soft tissues and spinal canal: Surrounding soft tissue structures are within normal limits. Upper chest: Visualized lung apices are within normal limits. Other: None IMPRESSION: CT of the head: Chronic white matter ischemic changes. No acute abnormality noted. CT of cervical spine: Changes of prior cervical fusion are seen. Mild degenerative changes are noted. No acute abnormality noted. Electronically Signed   By: Oneil Devonshire M.D.   On: 06/02/2024 03:36   CT Cervical Spine Wo Contrast Result Date: 06/02/2024 CLINICAL DATA:  Fall yesterday with headaches and neck pain, initial encounter EXAM: CT HEAD WITHOUT CONTRAST CT CERVICAL SPINE WITHOUT CONTRAST TECHNIQUE: Multidetector CT imaging of the head and cervical spine was performed following the standard protocol without  intravenous contrast. Multiplanar CT image reconstructions of the cervical spine were also generated. RADIATION DOSE REDUCTION: This exam was performed according to the departmental dose-optimization program which includes automated exposure control, adjustment of the mA and/or kV according to patient size and/or use of iterative reconstruction technique. COMPARISON:  None Available. FINDINGS: CT HEAD FINDINGS Brain: No evidence of acute infarction, hemorrhage, hydrocephalus, extra-axial collection or mass lesion/mass effect. Mild chronic white matter ischemic changes are noted. Vascular: No hyperdense vessel or unexpected calcification. Skull: Normal. Negative for fracture or focal lesion. Sinuses/Orbits: No acute finding. Other: None. CT CERVICAL SPINE FINDINGS Alignment: Within normal limits. Skull base and vertebrae: 7 cervical segments are well visualized. Prior fusion at C4-5 is noted with anterior fixation. Posterior fixation extending from C4 to T1 is noted bilaterally. The odontoid is within normal limits. No acute fracture or acute facet abnormality is noted. Multilevel osteophytic change and facet hypertrophic changes are noted. Soft tissues and spinal canal: Surrounding soft tissue structures are within normal limits. Upper chest: Visualized lung apices are within normal limits. Other: None IMPRESSION: CT of the head: Chronic white matter ischemic changes. No acute abnormality noted. CT of cervical spine: Changes of prior cervical fusion are seen. Mild degenerative changes are noted. No acute abnormality noted. Electronically Signed   By: Oneil Devonshire M.D.   On: 06/02/2024 03:36   EEG adult Result Date: 05/09/2024 Shelton Arlin KIDD, MD     05/09/2024  5:04 PM Patient Name: NAVARRE DIANA MRN: [id number] Epilepsy Attending: Arlin KIDD Shelton Referring Physician/Provider: Shelton Arlin KIDD, MD Date: 05/09/2024 Duration: 22.06 mins Patient history: 63 y.o. male with past medical history of hypertension,  diabetes, end-stage renal disease on hemodialysis MWF, nicotine use disorder who presented with altered mental status. EEG to evaluate for seizure Level of alertness: Awake, asleep AEDs during EEG study: None Technical aspects: This EEG study was done with scalp electrodes positioned according to the 10-20 International system of electrode placement. Electrical activity was reviewed with band pass filter of 1-70Hz , sensitivity of 7 uV/mm, display speed of 55mm/sec with a 60Hz  notched filter applied as  appropriate. EEG data were recorded continuously and digitally stored.  Video monitoring was available and reviewed as appropriate. Description: The posterior dominant rhythm consists of 7.5 Hz activity of moderate voltage (25-35 uV) seen predominantly in posterior head regions, symmetric and reactive to eye opening and eye closing. Sleep was characterized by vertex waves, sleep spindles (12 to 14 Hz), maximal frontocentral region. EEG showed intermittent generalized  3 to 6 Hz theta-delta slowing. Hyperventilation and photic stimulation were not performed.   ABNORMALITY - Intermittent slow, generalized IMPRESSION: This study is suggestive of mild diffuse encephalopathy. No seizures or epileptiform discharges were seen throughout the recording. Arlin MALVA Krebs   DG Chest 2 View Result Date: 05/09/2024 CLINICAL DATA:  Weakness. EXAM: CHEST - 2 VIEW COMPARISON:  05/07/2024. FINDINGS: Stable cardiomediastinal contours. No focal consolidation, pleural effusion, or pneumothorax. Partially visualized lower cervical fusion and lumbar fusion hardware. Degenerative changes of the thoracic spine. No acute osseous abnormality. IMPRESSION: No acute cardiopulmonary findings. Electronically Signed   By: Harrietta Sherry M.D.   On: 05/09/2024 14:09   DG CHEST PORT 1 VIEW Result Date: 05/07/2024 CLINICAL DATA:  Fever. EXAM: PORTABLE CHEST 1 VIEW COMPARISON:  Radiographs 05/04/2024. FINDINGS: 0805 hours. The heart size and  mediastinal contours are normal. The lungs are clear. There is no pleural effusion or pneumothorax. No acute osseous findings are identified. Mild degenerative changes in the spine. Previous lower cervical fusion, partially imaged. IMPRESSION: No evidence of acute cardiopulmonary process. Electronically Signed   By: Elsie Perone M.D.   On: 05/07/2024 12:08   MR ANGIO HEAD WO CONTRAST Result Date: 05/06/2024 CLINICAL DATA:  Stroke follow-up EXAM: MRA HEAD WITHOUT CONTRAST TECHNIQUE: Angiographic images of the Circle of Willis were acquired using MRA technique without intravenous contrast. COMPARISON:  MRI head earlier same day. FINDINGS: Anterior circulation: The intracranial internal carotid arteries are patent. Atherosclerosis noted along the bilateral cavernous ICAs without evidence of high-grade stenosis. The right M1 segment and right MCA bifurcation are patent. Proximal right M2 branches are patent. Distal right MCA branches are patent. The left M1 segment and left MCA bifurcation are patent. Proximal left M2 branches are patent. Distal left MCA branches are patent. The A1 and A2 segments are patent bilaterally. Distal ACA branches are symmetric. Posterior circulation: The visualized intracranial vertebral arteries are patent. Basilar artery is patent. The bilateral posterior cerebral arteries are patent. Superior cerebellar arteries are patent bilaterally. The PICA patent bilaterally. Anatomic variants: None significant Other: None. IMPRESSION: Patent intracranial arterial vasculature. Atherosclerosis of the bilateral cavernous ICAs without high-grade stenosis. Electronically Signed   By: Donnice Mania M.D.   On: 05/06/2024 17:00   MR BRAIN WO CONTRAST Result Date: 05/06/2024 CLINICAL DATA:  63 year old male with altered mental status. End stage renal disease. Weakness. EXAM: MRI HEAD WITHOUT CONTRAST TECHNIQUE: Multiplanar, multiecho pulse sequences of the brain and surrounding structures were obtained  without intravenous contrast. COMPARISON:  None Available. FINDINGS: The examination was discontinued prior to completion by the patient. Diagnostic axial and coronal DWI, axial FLAIR and T2 * imaging only was obtained. Punctate superior posterior right frontal lobe white matter restricted diffusion on series 2, image 42 and series 3, image 42. No discrete FLAIR hyperintensity. Additional subcentimeter focus of restricted diffusion in the left lateral cerebellum on series 2, image 9. Furthermore, difficult to exclude additional punctate restricted diffusion in the medial left parietal lobe subcortical white matter (also on image 42), right midbrain (series 2, image 25). No midline shift, mass effect, evidence of mass  lesion, ventriculomegaly, extra-axial collection or acute intracranial hemorrhage. Vague but confluent periventricular white matter T2 hyperintensity. This is moderately advanced. Chronic lacunar infarct in the right pons on series 6, image 17. Additional pontine signal heterogeneity. And T2 * images do suggest chronic microhemorrhage in the left pons, also the left cerebellum. No definite cortical encephalomalacia. Chronic small vessel ischemia and microhemorrhage in the brainstem. Confluent cerebral white matter signal changes also most commonly due to small vessel disease. IMPRESSION: 1. Truncated exam. 2. Positive for multiple subcentimeter acute lacunar type infarcts, including the posterior right frontal lobe white matter, left lateral cerebellum. Possible additional punctate involvement including in the right midbrain. Negative for evidence of acute intracranial hemorrhage or mass effect. 3. Evidence of underlying moderately advanced chronic small vessel ischemia in the brain, including chronic lacunar infarcts in the right pons, chronic microhemorrhage in the left pons and left cerebellum. Electronically Signed   By: VEAR Hurst M.D.   On: 05/06/2024 11:04   ECHOCARDIOGRAM COMPLETE Result Date:  05/05/2024    ECHOCARDIOGRAM REPORT   Patient Name:   JAYLIN BENZEL Date of Exam: 05/05/2024 Medical Rec #:  [id number]        Height:       69.0 in Accession #:    [id number]       Weight:       138.4 lb Date of Birth:  11-Oct-1961        BSA:          1.767 m Patient Age:    62 years         BP:           133/70 mmHg Patient Gender: M                HR:           84 bpm. Exam Location:  Inpatient Procedure: 2D Echo, Color Doppler and Cardiac Doppler (Both Spectral and Color            Flow Doppler were utilized during procedure). Indications:    Syncope  History:        Patient has no prior history of Echocardiogram examinations.  Sonographer:    Tinnie Gosling RDCS Referring Phys: [phone] Karel Mowers L Undine Nealis IMPRESSIONS  1. Left ventricular ejection fraction, by estimation, is 65 to 70%. The left ventricle has normal function. The left ventricle has no regional wall motion abnormalities. There is moderate concentric left ventricular hypertrophy. Left ventricular diastolic parameters were normal.  2. Right ventricular systolic function is normal. The right ventricular size is normal. Tricuspid regurgitation signal is inadequate for assessing PA pressure.  3. Left atrial size was moderately dilated.  4. The mitral valve is degenerative. Trivial mitral valve regurgitation.  5. The aortic valve is tricuspid. There is mild calcification of the aortic valve. Aortic valve regurgitation is not visualized.  6. The inferior vena cava is normal in size with greater than 50% respiratory variability, suggesting right atrial pressure of 3 mmHg. Comparison(s): No prior Echocardiogram. FINDINGS  Left Ventricle: Left ventricular ejection fraction, by estimation, is 65 to 70%. The left ventricle has normal function. The left ventricle has no regional wall motion abnormalities. The left ventricular internal cavity size was normal in size. There is  moderate concentric left ventricular hypertrophy. Left ventricular diastolic parameters  were normal. Right Ventricle: The right ventricular size is normal. No increase in right ventricular wall thickness. Right ventricular systolic function is normal. Tricuspid regurgitation signal is inadequate for assessing PA  pressure. Left Atrium: Left atrial size was moderately dilated. Right Atrium: Right atrial size was normal in size. Pericardium: Trivial pericardial effusion is present. The pericardial effusion is posterior to the left ventricle. Mitral Valve: The mitral valve is degenerative in appearance. There is mild calcification of the mitral valve leaflet(s). Mild mitral annular calcification. Trivial mitral valve regurgitation. Tricuspid Valve: The tricuspid valve is grossly normal. Tricuspid valve regurgitation is mild. Aortic Valve: The aortic valve is tricuspid. There is mild calcification of the aortic valve. There is mild aortic valve annular calcification. Aortic valve regurgitation is not visualized. Pulmonic Valve: The pulmonic valve was grossly normal. Pulmonic valve regurgitation is trivial. Aorta: The aortic root is normal in size and structure. Venous: The inferior vena cava is normal in size with greater than 50% respiratory variability, suggesting right atrial pressure of 3 mmHg. IAS/Shunts: No atrial level shunt detected by color flow Doppler. Additional Comments: 3D was performed not requiring image post processing on an independent workstation and was indeterminate.  LEFT VENTRICLE PLAX 2D LVIDd:         4.90 cm   Diastology LVIDs:         2.90 cm   LV e' medial:    9.14 cm/s LV PW:         1.50 cm   LV E/e' medial:  9.7 LV IVS:        1.50 cm   LV e' lateral:   8.16 cm/s LVOT diam:     2.50 cm   LV E/e' lateral: 10.9 LV SV:         90 LV SV Index:   51 LVOT Area:     4.91 cm  RIGHT VENTRICLE             IVC RV S prime:     20.00 cm/s  IVC diam: 1.10 cm TAPSE (M-mode): 3.5 cm LEFT ATRIUM           Index        RIGHT ATRIUM           Index LA diam:      3.50 cm 1.98 cm/m   RA Area:      13.70 cm LA Vol (A4C): 88.5 ml 50.08 ml/m  RA Volume:   32.50 ml  18.39 ml/m  AORTIC VALVE LVOT Vmax:   93.80 cm/s LVOT Vmean:  59.800 cm/s LVOT VTI:    0.184 m  AORTA Ao Root diam: 3.00 cm Ao Asc diam:  3.10 cm MITRAL VALVE MV Area (PHT): 4.29 cm    SHUNTS MV Decel Time: 177 msec    Systemic VTI:  0.18 m MV E velocity: 88.80 cm/s  Systemic Diam: 2.50 cm MV A velocity: 77.80 cm/s MV E/A ratio:  1.14 Jayson Sierras MD Electronically signed by Jayson Sierras MD Signature Date/Time: 05/05/2024/3:49:27 PM    Final    US  Carotid Bilateral Result Date: 05/05/2024 CLINICAL DATA:  63 year old male with history of syncope and collapse. EXAM: BILATERAL CAROTID DUPLEX ULTRASOUND TECHNIQUE: Elnor scale imaging, color Doppler and duplex ultrasound were performed of bilateral carotid and vertebral arteries in the neck. COMPARISON:  None Available. FINDINGS: Criteria: Quantification of carotid stenosis is based on velocity parameters that correlate the residual internal carotid diameter with NASCET-based stenosis levels, using the diameter of the distal internal carotid lumen as the denominator for stenosis measurement. The following velocity measurements were obtained: RIGHT ICA: Peak systolic velocity 119 cm/sec, End diastolic velocity 35 cm/sec CCA: Peak systolic velocity 113  cm/sec SYSTOLIC ICA/CCA RATIO:  1.1 ECA: Peak systolic velocity 162 cm/sec LEFT ICA: Peak systolic velocity 152 cm/sec, End diastolic velocity 25 cm/sec CCA: 110 cm/sec SYSTOLIC ICA/CCA RATIO:  1.4 ECA: 4138 cm/sec RIGHT CAROTID ARTERY: Moderate scattered atherosclerotic plaque formation most prominent about the carotid bulb and bifurcation. No significant tortuosity. Normal low resistance waveforms. RIGHT VERTEBRAL ARTERY:  Antegrade flow. LEFT CAROTID ARTERY: Moderate scattered atherosclerotic plaque formationmost prominent about the carotid bulb and bifurcation. No significant tortuosity. Normal low resistance waveforms. LEFT VERTEBRAL ARTERY:   Antegrade flow. Upper extremity non-invasive blood pressures: Not obtained. IMPRESSION: 1. Right carotid artery system: Less than 50% stenosis secondary to moderate scattered atherosclerotic plaque formation, most prominent about the carotid bulb and bifurcation. 2. Left carotid artery system: Less than 50% stenosis secondary to moderate scattered atherosclerotic plaque formation, most prominent about the carotid bulb and bifurcation. 3.  Vertebral artery system: Patent with antegrade flow bilaterally. Ester Sides, MD [facility] Radiology Electronically Signed   By: Ester Sides M.D.   On: 05/05/2024 10:44   CT ABDOMEN PELVIS WO CONTRAST Result Date: 05/04/2024 CLINICAL DATA:  Abdominal pain. EXAM: CT ABDOMEN AND PELVIS WITHOUT CONTRAST TECHNIQUE: Multidetector CT imaging of the abdomen and pelvis was performed following the standard protocol without IV contrast. RADIATION DOSE REDUCTION: This exam was performed according to the departmental dose-optimization program which includes automated exposure control, adjustment of the mA and/or kV according to patient size and/or use of iterative reconstruction technique. COMPARISON:  None Available. FINDINGS: Evaluation of this exam is limited in the absence of intravenous contrast. Evaluation is also limited due to streak artifact caused by patient's arms and spinal hardware. Lower chest: The visualized lung bases are clear. There is coronary vascular calcification. No intra-abdominal free air or free fluid. Hepatobiliary: The liver is unremarkable. No biliary dilatation. The gallbladder is unremarkable. Pancreas: Unremarkable. No pancreatic ductal dilatation or surrounding inflammatory changes. Spleen: Normal in size without focal abnormality. Adrenals/Urinary Tract: The right adrenal gland is unremarkable. A 2 cm left adrenal adenoma. There is no hydronephrosis or nephrolithiasis on either side. The visualized  ureters and urinary bladder appear unremarkable. Stomach/Bowel: There is no bowel obstruction or active inflammation. The appendix is normal. Vascular/Lymphatic: Moderate aortoiliac atherosclerotic disease. The IVC is unremarkable. No portal venous gas. There is no adenopathy. Reproductive: The prostate and seminal vesicles are grossly unremarkable. No pelvic mass. Other: Partially visualized 2.5 x 5.0 cm indurated area in the subcutaneous soft tissues of the right buttock adjacent to the intergluteal cleft suspicious for a perianal collection or developing abscess. Ultrasound may provide better evaluation. Musculoskeletal: Osteopenia with degenerative changes of the spine. Multilevel posterior fusion hardware. No acute osseous pathology. IMPRESSION: 1. No hydronephrosis or nephrolithiasis. 2. No bowel obstruction. Normal appendix. 3. Right perianal collection or developing abscess. Ultrasound may provide better evaluation. 4.  Aortic Atherosclerosis (ICD10-I70.0). Electronically Signed   By: Vanetta Chou M.D.   On: 05/04/2024 17:44   DG Chest Port 1 View Result Date: 05/04/2024 CLINICAL DATA:  106001 Syncope 106001 EXAM: PORTABLE CHEST - 1 VIEW COMPARISON:  None available. FINDINGS: No focal airspace consolidation, pleural effusion, or pneumothorax. No cardiomegaly. Aortic atherosclerosis. No acute fracture or destructive lesions. Multilevel thoracic osteophytosis. Partially visualized cervical fusion hardware IMPRESSION: No acute cardiopulmonary abnormality. Electronically Signed   By: Rogelia Myers M.D.   On: 05/04/2024 16:32     Subjective: Pt back to baseline mental status, now asking for pain medication, I explained to him that  he should no longer take morphine  as it can be toxic for him and he verbalized understanding.  I asked him not to take anymore refills of morphine .   Discharge Exam: Vitals:   06/03/24 1137 06/03/24 1200  BP:  108/61  Pulse:    Resp:  19  Temp: 98 F (36.7 C)    SpO2:     Vitals:   06/03/24 1100 06/03/24 1128 06/03/24 1137 06/03/24 1200  BP: (!) 93/55 116/68  108/61  Pulse:      Resp: 16 20  19   Temp:   98 F (36.7 C)   TempSrc:   Oral   SpO2:      Weight:      Height:        General: Pt is alert, awake, not in acute distress Cardiovascular: normal S1/S2 +, no rubs, no gallops Respiratory: CTA bilaterally, no wheezing, no rhonchi Abdominal: Soft, NT, ND, bowel sounds + Extremities: no edema, no cyanosis   The results of significant diagnostics from this hospitalization (including imaging, microbiology, ancillary and laboratory) are listed below for reference.     Microbiology: Recent Results (from the past 240 hours)  MRSA Next Gen by PCR, Nasal     Status: None   Collection Time: 06/02/24  9:40 AM   Specimen: Nasal Mucosa; Nasal Swab  Result Value Ref Range Status   MRSA by PCR Next Gen NOT DETECTED NOT DETECTED Final    Comment: (NOTE) The GeneXpert MRSA Assay (FDA approved for NASAL specimens only), is one component of a comprehensive MRSA colonization surveillance program. It is not intended to diagnose MRSA infection nor to guide or monitor treatment for MRSA infections. Test performance is not FDA approved in patients less than 53 years old. Performed at [facility], [address] [place], [place] [address]      Labs: BNP (last 3 results) No results for input(s): BNP in the last 8760 hours. Basic Metabolic Panel: Recent Labs  Lab 06/02/24 0251 06/02/24 0742 06/03/24 0418  NA 142  --  138  K 6.4* 6.3* 4.9  CL 108  --  97*  CO2 18*  --  26  GLUCOSE 89  --  105*  BUN 90*  --  56*  CREATININE 10.13*  --  6.82*  CALCIUM  9.0  --  8.9  PHOS  --   --  7.9*   Liver Function Tests: Recent Labs  Lab 06/02/24 0251 06/03/24 0418  AST 12*  --   ALT 7  --   ALKPHOS 53  --   BILITOT 1.1  --   PROT 6.5  --   ALBUMIN  3.0* 2.9*   No results for input(s): LIPASE, AMYLASE in the last 168 hours. No results  for input(s): AMMONIA in the last 168 hours. CBC: Recent Labs  Lab 06/02/24 0251 06/03/24 0418  WBC 9.2 8.5  NEUTROABS 6.8  --   HGB 9.3* 11.0*  HCT 29.3* 34.6*  MCV 94.8 93.8  PLT 278 313   Cardiac Enzymes: Recent Labs  Lab 06/02/24 0251  CKTOTAL 197   BNP: Invalid input(s): POCBNP CBG: Recent Labs  Lab 06/02/24 2025 06/03/24 0036 06/03/24 0429 06/03/24 0739 06/03/24 1115  GLUCAP 99 137* 110* 112* 193*   D-Dimer No results for input(s): DDIMER in the last 72 hours. Hgb A1c No results for input(s): HGBA1C in the last 72 hours. Lipid Profile No results for input(s): CHOL, HDL, LDLCALC, TRIG, CHOLHDL, LDLDIRECT in the last 72 hours. Thyroid function studies  No results for input(s): TSH, T4TOTAL, T3FREE, THYROIDAB in the last 72 hours.  Invalid input(s): FREET3 Anemia work up No results for input(s): VITAMINB12, FOLATE, FERRITIN, TIBC, IRON , RETICCTPCT in the last 72 hours. Urinalysis No results found for: COLORURINE, APPEARANCEUR, LABSPEC, PHURINE, GLUCOSEU, HGBUR, BILIRUBINUR, KETONESUR, PROTEINUR, UROBILINOGEN, NITRITE, LEUKOCYTESUR Sepsis Labs Recent Labs  Lab 06/02/24 0251 06/03/24 0418  WBC 9.2 8.5   Microbiology Recent Results (from the past 240 hours)  MRSA Next Gen by PCR, Nasal     Status: None   Collection Time: 06/02/24  9:40 AM   Specimen: Nasal Mucosa; Nasal Swab  Result Value Ref Range Status   MRSA by PCR Next Gen NOT DETECTED NOT DETECTED Final    Comment: (NOTE) The GeneXpert MRSA Assay (FDA approved for NASAL specimens only), is one component of a comprehensive MRSA colonization surveillance program. It is not intended to diagnose MRSA infection nor to guide or monitor treatment for MRSA infections. Test performance is not FDA approved in patients less than 15 years old. Performed at [facility], [address] [place], [place] [address]    Time coordinating  discharge: 34 mins   SIGNED:  Afton Louder, MD  [facility] 06/03/2024, 12:16 PM How to contact the [facility] Attending or Consulting provider 7A - 7P or covering provider during after hours 7P -7A, for this patient?  Check the care team in [facility] and look for a) attending/consulting TRH provider listed and b) the TRH team listed Log into [email or web address] and use [place]'s universal password to access. If you do not have the password, please contact the hospital operator. Locate the TRH provider you are looking for under [facility] and page to a number that you can be directly reached. If you still have difficulty reaching the provider, please page the [facility] (Director on Call) for the Hospitalists listed on amion for assistance.

## 2024-06-03 NOTE — TOC Progression Note (Signed)
 Transition of Care [facility]) - Progression Note    Patient Details  Name: Curtis Hartman MRN: [id number] Date of Birth: 1961/03/05  Transition of Care [facility]) CM/SW Contact  Noreen KATHEE [place], [place] Phone Number: 06/03/2024, 2:50 PM  Clinical Narrative:     PT recommended SNF. CSW spoke with patient at bedside about two possible SNF options who can accept his insurance . Patient agreeable . Referral sent via HUB.  Expected Discharge Plan: Skilled Nursing Facility Barriers to Discharge: SNF Pending bed offer     Expected Discharge Plan and Services In-house Referral: Clinical Social Work Discharge Planning Services: CM Consult Post Acute Care Choice: Durable Medical Equipment Living arrangements for the past 2 months: Single Family Home Expected Discharge Date: 06/03/24                                     Social Drivers of Health (SDOH) Interventions SDOH Screenings   Food Insecurity: No Food Insecurity (06/02/2024)  Housing: Low Risk  (06/02/2024)  Transportation Needs: No Transportation Needs (06/02/2024)  Utilities: Not At Risk (06/02/2024)  Financial Resource Strain: High Risk (01/15/2024)   Received from [facility]  Physical Activity: Unknown (01/15/2024)   Received from [facility]  Social Connections: Socially Isolated (06/02/2024)  Stress: Stress Concern Present (02/29/2024)   Received from [facility]  Tobacco Use: High Risk (06/02/2024)    Readmission Risk Interventions    06/03/2024    2:25 PM 06/03/2024   12:50 PM 06/02/2024   10:15 AM  Readmission Risk Prevention Plan  Transportation Screening Complete Complete Complete  PCP or Specialist Appt within 3-5 Days   Complete  HRI or Home Care Consult   Complete  Social Work Consult for Recovery Care Planning/Counseling   Complete  Palliative Care Screening   Not Applicable  Medication Review Oceanographer) Complete Complete Complete  HRI or Home Care Consult Complete Complete   SW Recovery  Care/Counseling Consult Complete Complete   Palliative Care Screening Not Applicable Not Applicable   Skilled Nursing Facility Complete Not Applicable

## 2024-06-03 NOTE — Evaluation (Signed)
 Occupational Therapy Evaluation Patient Details Name: Curtis Hartman MRN: [id number] DOB: [birth year] Today's Date: 06/03/2024   History of Present Illness   CARLTON BUSKEY is a 63 y.o. male with medical history significant for hypertension, type 2 diabetes mellitus, COPD, ESRD on hemodialysis, recent CVA, and chronic pain who presents with lethargy and generalized weakness after a fall at home.      Patient fell at home last night at approximately 8:30 PM per report of his sister who encouraged him to call EMS but he refused.  He was then found sleeping on the floor at 2 AM and EMS was called.  Patient reports that he had been fatigued, feeling generally weak, slipped and fell, and was unable to get up on his own.  He denies any chest pain, palpitations, focal weakness, fever, or chills.     Patient states that he was last dialyzed on 05/27/2024 and has missed his last 2 sessions.     He had a similar presentation 4 weeks ago, was instructed to discontinue morphine  at that time, but continues to take 30 mg morphine  sulfate IR twice daily.     Clinical Impressions Pt agreeable to OT and PT co-evaluation. Pt reports independence for ADL's and use of rollator for ambulation at baseline. Pt required mod I to supervision assist for bed mobility and CGA to min A for ambulation with RW today. Unsteady in standing and ambulating. Mild weakness in R UE with more pronounced A/ROM and strength limitations in L shoulder. Able to complete seated ADL's with set up assist. Pt left in the bed with bed alarm set. Pt will benefit from continued OT in the hospital and recommended venue below to increase strength, balance, and endurance for safe ADL's.       If plan is discharge home, recommend the following:   A little help with walking and/or transfers;A little help with bathing/dressing/bathroom;Assistance with cooking/housework;Assist for transportation;Help with stairs or ramp for entrance     Functional  Status Assessment   Patient has had a recent decline in their functional status and demonstrates the ability to make significant improvements in function in a reasonable and predictable amount of time.     Equipment Recommendations   None recommended by OT             Precautions/Restrictions   Precautions Precautions: Fall Recall of Precautions/Restrictions: Intact Restrictions Weight Bearing Restrictions Per Provider Order: No     Mobility Bed Mobility Overal bed mobility: Needs Assistance Bed Mobility: Supine to Sit, Sit to Supine     Supine to sit: Modified independent (Device/Increase time), Supervision Sit to supine: Supervision, Modified independent (Device/Increase time)   General bed mobility comments: use of rail; labored    Transfers Overall transfer level: Needs assistance Equipment used: Rolling walker (2 wheels) Transfers: Sit to/from Stand Sit to Stand: Min assist, Contact guard assist           General transfer comment: use of RW ; unsteady; slow movement      Balance Overall balance assessment: Needs assistance Sitting-balance support: Feet supported, No upper extremity supported Sitting balance-Leahy Scale: Good Sitting balance - Comments: seated EOB   Standing balance support: During functional activity, Reliant on assistive device for balance, Bilateral upper extremity supported Standing balance-Leahy Scale: Fair Standing balance comment: RW                           ADL either performed or assessed  with clinical judgement   ADL Overall ADL's : Needs assistance/impaired     Grooming: Contact guard assist;Minimal assistance;Standing       Lower Body Bathing: Set up;Contact guard assist;Sitting/lateral leans       Lower Body Dressing: Set up;Contact guard assist;Sitting/lateral leans Lower Body Dressing Details (indicate cue type and reason): Able to don socks seated at EOB with extended time. Toilet Transfer:  Contact guard assist;Minimal assistance;Ambulation;Rolling walker (2 wheels) Toilet Transfer Details (indicate cue type and reason): ambualtion in the room Toileting- Clothing Manipulation and Hygiene: Set up;Contact guard assist;Sitting/lateral lean       Functional mobility during ADLs: Contact guard assist;Minimal assistance;Rolling walker (2 wheels) General ADL Comments: Ambulated to the door and back to the bed.     Vision Baseline Vision/History: 1 Wears glasses Ability to See in Adequate Light: 1 Impaired Patient Visual Report: No change from baseline Vision Assessment?: No apparent visual deficits     Perception Perception: Not tested       Praxis Praxis: Not tested       Pertinent Vitals/Pain Pain Assessment Pain Assessment: Faces Faces Pain Scale: Hurts little more Pain Location: Neck and low back Pain Descriptors / Indicators: Discomfort Pain Intervention(s): Limited activity within patient's tolerance, Monitored during session, Repositioned     Extremity/Trunk Assessment Upper Extremity Assessment Upper Extremity Assessment: RUE deficits/detail;LUE deficits/detail RUE Deficits / Details: 3-/5 shoulder flexion; generally weak. LUE Deficits / Details: 3-/5 with closer to 50% of available A/ROM for shoulder flexion and ~75% P/ROM.   Lower Extremity Assessment Lower Extremity Assessment: Defer to PT evaluation   Cervical / Trunk Assessment Cervical / Trunk Assessment: Kyphotic   Communication Communication Communication: No apparent difficulties   Cognition Arousal: Alert Behavior During Therapy: WFL for tasks assessed/performed Cognition: No family/caregiver present to determine baseline             OT - Cognition Comments: Pt able to follow commands but did seem to have possible memory issues given that he discussed getting to the toilet even though he had just returned to the bed from the toilet.                 Following commands: Intact        Cueing  General Comments   Cueing Techniques: Verbal cues;Tactile cues;Visual cues                 Home Living Family/patient expects to be discharged to:: Private residence Living Arrangements: Other relatives Available Help at Discharge: Family;Available 24 hours/day Type of Home: House Home Access: Stairs to enter [employer] of Steps: 2   Home Layout: One level     Bathroom Shower/Tub: Chief Strategy Officer: Standard Bathroom Accessibility: Yes   Home Equipment: Rollator (4 wheels);BSC/3in1;Adaptive equipment;Shower seat;Tub bench Adaptive Equipment: Reacher Additional Comments: Reports no change in home set up since last admission      Prior Functioning/Environment Prior Level of Function : Independent/Modified Independent;History of Falls (last six months)             Mobility Comments: 3 falls; rollator used for mobility most the time; some wall leaning. ADLs Comments: Independent for ADLs; some assist IADL's    OT Problem List: Decreased strength;Decreased range of motion;Decreased activity tolerance;Impaired balance (sitting and/or standing)   OT Treatment/Interventions: Self-care/ADL training;Therapeutic exercise;Therapeutic activities;Patient/family education;Balance training;DME and/or AE instruction      OT Goals(Current goals can be found in the care plan section)   Acute Rehab OT Goals  Patient Stated Goal: improve function OT Goal Formulation: With patient Time For Goal Achievement: 06/17/24 Potential to Achieve Goals: Good   OT Frequency:  Min 2X/week    Co-evaluation PT/OT/SLP Co-Evaluation/Treatment: Yes Reason for Co-Treatment: To address functional/ADL transfers PT goals addressed during session: Mobility/safety with mobility OT goals addressed during session: ADL's and self-care      AM-PAC OT 6 Clicks Daily Activity     Outcome Measure                 End of Session Equipment Utilized  During Treatment: Rolling walker (2 wheels);Gait belt Nurse Communication: Mobility status  Activity Tolerance: Patient tolerated treatment well Patient left: in bed;with call bell/phone within reach;with bed alarm set  OT Visit Diagnosis: Other abnormalities of gait and mobility (R26.89);Unsteadiness on feet (R26.81);Muscle weakness (generalized) (M62.81);History of falling (Z91.81);Repeated falls (R29.6)                Time: 8668-8653 OT Time Calculation (min): 15 min Charges:  OT General Charges $OT Visit: 1 Visit OT Evaluation $OT Eval Low Complexity: 1 Low  Clarke Amburn OT, MOT   Jayson Person 06/03/2024, 2:06 PM

## 2024-06-03 NOTE — Discharge Instructions (Signed)
 CONTINUE REGULAR OUTPATIENT DIALYSIS TREATMENT SCHEDULE AND STOP MISSING TREATMENTS.   STOP TAKING MORPHINE    IMPORTANT INFORMATION: PAY CLOSE ATTENTION   PHYSICIAN DISCHARGE INSTRUCTIONS  Follow with Primary care provider  Curtis Hartman CROME, FNP  and other consultants as instructed by your Hospitalist Physician  SEEK MEDICAL CARE OR RETURN TO EMERGENCY ROOM IF SYMPTOMS COME BACK, WORSEN OR NEW PROBLEM DEVELOPS   Please note: You were cared for by a hospitalist during your hospital stay. Every effort will be made to forward records to your primary care provider.  You can request that your primary care provider send for your hospital records if they have not received them.  Once you are discharged, your primary care physician will handle any further medical issues. Please note that NO REFILLS for any discharge medications will be authorized once you are discharged, as it is imperative that you return to your primary care physician (or establish a relationship with a primary care physician if you do not have one) for your post hospital discharge needs so that they can reassess your need for medications and monitor your lab values.  Please get a complete blood count and chemistry panel checked by your Primary MD at your next visit, and again as instructed by your Primary MD.  Get Medicines reviewed and adjusted: Please take all your medications with you for your next visit with your Primary MD  Laboratory/radiological data: Please request your Primary MD to go over all hospital tests and procedure/radiological results at the follow up, please ask your primary care provider to get all Hospital records sent to his/her office.  In some cases, they will be blood work, cultures and biopsy results pending at the time of your discharge. Please request that your primary care provider follow up on these results.  If you are diabetic, please bring your blood sugar readings with you to your follow up  appointment with primary care.    Please call and make your follow up appointments as soon as possible.    Also Note the following: If you experience worsening of your admission symptoms, develop shortness of breath, life threatening emergency, suicidal or homicidal thoughts you must seek medical attention immediately by calling 911 or calling your MD immediately  if symptoms less severe.  You must read complete instructions/literature along with all the possible adverse reactions/side effects for all the Medicines you take and that have been prescribed to you. Take any new Medicines after you have completely understood and accpet all the possible adverse reactions/side effects.   Do not drive when taking Pain medications or sleeping medications (Benzodiazepines)  Do not take more than prescribed Pain, Sleep and Anxiety Medications. It is not advisable to combine anxiety,sleep and pain medications without talking with your primary care practitioner  Special Instructions: If you have smoked or chewed Tobacco  in the last 2 yrs please stop smoking, stop any regular Alcohol  and or any Recreational drug use.  Wear Seat belts while driving.  Do not drive if taking any narcotic, mind altering or controlled substances or recreational drugs or alcohol.

## 2024-06-03 NOTE — Plan of Care (Signed)
  Problem: Nutritional: Goal: Maintenance of adequate nutrition will improve Outcome: Progressing   Problem: Nutrition: Goal: Adequate nutrition will be maintained Outcome: Progressing

## 2024-06-03 NOTE — Evaluation (Signed)
 Physical Therapy Evaluation Patient Details Name: Curtis Hartman MRN: [id number] DOB: 05/18/1961 Today's Date: 06/03/2024  History of Present Illness  Curtis Hartman is a 63 y.o. male with medical history significant for hypertension, type 2 diabetes mellitus, COPD, ESRD on hemodialysis, recent CVA, and chronic pain who presents with lethargy and generalized weakness after a fall at home.      Patient fell at home last night at approximately 8:30 PM per report of his sister who encouraged him to call EMS but he refused.  He was then found sleeping on the floor at 2 AM and EMS was called.  Patient reports that he had been fatigued, feeling generally weak, slipped and fell, and was unable to get up on his own.  He denies any chest pain, palpitations, focal weakness, fever, or chills.     Patient states that he was last dialyzed on 05/27/2024 and has missed his last 2 sessions.     He had a similar presentation 4 weeks ago, was instructed to discontinue morphine  at that time, but continues to take 30 mg morphine  sulfate IR twice daily.   Clinical Impression  Patient tolerated OT/PT co-evaluation well this date, although limited due to general weakness throughout and fatigue. Patient is mod independent for bed mobility but requires min/CGA for transfers and ambulation this date due to unsteadiness on feet. Patient reports at least 5 falls in last 6 months, 3 being yesterday. Patient reports he is unsure if sister whom he lives with is able to assist him physically at his current functional level. Patient returns to bed, with call button in reach bed alarm set, and nursing staff notified. Patient will benefit from continued skilled physical therapy acutely and in recommended venue in order to address the above to limit fall risk once back home.        If plan is discharge home, recommend the following: A little help with walking and/or transfers;A little help with bathing/dressing/bathroom;Assist for  transportation;Help with stairs or ramp for entrance;Assistance with cooking/housework   Can travel by private vehicle        Equipment Recommendations None recommended by PT  Recommendations for Other Services       Functional Status Assessment Patient has had a recent decline in their functional status and demonstrates the ability to make significant improvements in function in a reasonable and predictable amount of time.     Precautions / Restrictions Precautions Precautions: Fall Recall of Precautions/Restrictions: Intact Restrictions Weight Bearing Restrictions Per Provider Order: No      Mobility  Bed Mobility Overal bed mobility: Needs Assistance Bed Mobility: Supine to Sit, Sit to Supine     Supine to sit: Modified independent (Device/Increase time), Supervision Sit to supine: Supervision, Modified independent (Device/Increase time)   General bed mobility comments: HOB flat, use of railings, inc time for slow labored movement    Transfers Overall transfer level: Needs assistance Equipment used: Rolling walker (2 wheels) Transfers: Sit to/from Stand Sit to Stand: Min assist, Contact guard assist           General transfer comment: Slow labored movement, w/ RW and CGA/min A due to LE weakness    Ambulation/Gait Ambulation/Gait assistance: Min assist, Contact guard assist Gait Distance (Feet): 10 Feet Assistive device: Rolling walker (2 wheels) Gait Pattern/deviations: Step-through pattern, Decreased step length - right, Decreased step length - left, Trunk flexed, Staggering right Gait velocity: Dec     General Gait Details: Limited to ambulation in room due to  fatigue and general weakness. Pt unsteady throughout, and staggers to R at end of trial, requiring min A for recovery. Noted exaggerrating knee/hip flexion with steps  Stairs            Wheelchair Mobility     Tilt Bed    Modified Rankin (Stroke Patients Only)       Balance  Overall balance assessment: Needs assistance Sitting-balance support: Feet supported, No upper extremity supported Sitting balance-Leahy Scale: Good Sitting balance - Comments: seated EOB   Standing balance support: During functional activity, Reliant on assistive device for balance, Bilateral upper extremity supported Standing balance-Leahy Scale: Fair Standing balance comment: w/ RW and CGA/min A needed for safety           Pertinent Vitals/Pain Pain Assessment Pain Assessment: Faces Faces Pain Scale: Hurts little more Pain Location: Neck and low back Pain Descriptors / Indicators: Constant, Discomfort Pain Intervention(s): Limited activity within patient's tolerance, Monitored during session, Repositioned    Home Living Family/patient expects to be discharged to:: Private residence Living Arrangements: Other relatives Available Help at Discharge: Family;Available 24 hours/day Type of Home: House Home Access: Stairs to enter   [employer] of Steps: 2   Home Layout: One level Home Equipment: Rollator (4 wheels);BSC/3in1;Adaptive equipment;Shower seat;Tub bench Additional Comments: Reports no change in home set up since last admission    Prior Function Prior Level of Function : Independent/Modified Independent       Mobility Comments: Reports cont use of rollator, reports 3 falls yesterday where he was unable to stand independently, sister does grocery shopping, does not drive ADLs Comments: Independent for ADLs     Extremity/Trunk Assessment   Upper Extremity Assessment Upper Extremity Assessment: Defer to OT evaluation    Lower Extremity Assessment Lower Extremity Assessment: Generalized weakness (Generally weak throughout. Knee ext and hip flexion MMT 3+/5 at best. Adequate for functional transfers.)    Cervical / Trunk Assessment Cervical / Trunk Assessment: Kyphotic  Communication   Communication Communication: No apparent difficulties     Cognition Arousal: Alert Behavior During Therapy: WFL for tasks assessed/performed   PT - Cognitive impairments: No apparent impairments     Following commands: Intact       Cueing Cueing Techniques: Verbal cues, Tactile cues, Visual cues     General Comments      Exercises     Assessment/Plan    PT Assessment Patient needs continued PT services;All further PT needs can be met in the next venue of care  PT Problem List Decreased strength;Decreased range of motion;Decreased activity tolerance;Decreased balance;Decreased mobility;Pain       PT Treatment Interventions DME instruction;Balance training;Gait training;Stair training;Functional mobility training;Therapeutic activities;Therapeutic exercise;Patient/family education    PT Goals (Current goals can be found in the Care Plan section)  Acute Rehab PT Goals Patient Stated Goal: Return home following short rehab stay PT Goal Formulation: With patient Time For Goal Achievement: 06/17/24 Potential to Achieve Goals: Good    Frequency Min 3X/week     Co-evaluation PT/OT/SLP Co-Evaluation/Treatment: Yes Reason for Co-Treatment: To address functional/ADL transfers PT goals addressed during session: Mobility/safety with mobility         AM-PAC PT 6 Clicks Mobility  Outcome Measure Help needed turning from your back to your side while in a flat bed without using bedrails?: A Little Help needed moving from lying on your back to sitting on the side of a flat bed without using bedrails?: A Little Help needed moving to and from a bed to  a chair (including a wheelchair)?: A Little Help needed standing up from a chair using your arms (e.g., wheelchair or bedside chair)?: A Little Help needed to walk in hospital room?: A Little Help needed climbing 3-5 steps with a railing? : A Little 6 Click Score: 18    End of Session Equipment Utilized During Treatment: Gait belt Activity Tolerance: Patient limited by  fatigue Patient left: in bed;with call bell/phone within reach;with bed alarm set Nurse Communication: Mobility status PT Visit Diagnosis: Unsteadiness on feet (R26.81);Other abnormalities of gait and mobility (R26.89);Pain;Repeated falls (R29.6);Muscle weakness (generalized) (M62.81) Pain - part of body:  (Low back and neck)    Time: 8668-8653 PT Time Calculation (min) (ACUTE ONLY): 15 min   Charges:   PT Evaluation $PT Eval Low Complexity: 1 Low PT Treatments $Therapeutic Activity: 8-22 mins PT General Charges $$ ACUTE PT VISIT: 1 Visit         2:03 PM, 06/03/24 Kellee Sittner Powell-Butler, PT, DPT [place] with [facility]

## 2024-06-03 NOTE — Progress Notes (Signed)
 [facility] NEPHROLOGY PROGRESS NOTE  Assessment/ Plan: Pt is a 63 y.o. yo male  with past medical history significant for hypertension, type 2 diabetes, dyslipidemia, stroke, ESRD on HD who was presented after the fall associated with lethargy and generalized weakness, seen as a consultation for the management of ESRD.  Outpatient Dialysis Orders from recent hospitalization:  [facility], MWF, Elisio 17H Time 4:00, edw 62.5kg, LUE AVF BFR 350, DFR 600, 2K/2.5Ca bath Heparin  1000 units bolus then 1000 units/hr  # Hyperkalemia in dialysis patient: Improved with medical treatment and dialysis.  Recommend low potassium diet and regular dialysis outpatient.   # ESRD MWF at [facility]: Received dialysis yesterday because of hyperkalemia.  He developed episodes of hypotension during HD therefore we had to reduce to UF goal.  He is clinically much better today.  No need for further dialysis today.  I recommend him to resume his outpatient schedule MWF. AV fistula for the access.   # Hypertension/volume: BP acceptable.  Continue home antihypertensives and UF with HD.   # Anemia of ESRD: Hemoglobin at goal.  Continue to monitor outpatient.   # CKD-metabolic Bone Disease/hyperphosphatemia: Resume phosphorus binders, follow lab.  Ok to discharge from renal perspective.  Discussed with the primary team.  Please call on-call nephrologist with any question.  Subjective: Seen and examined at the bedside.  He reports feeling much better.  He is alert and awake today and seems more comfortable.  Denies nausea, vomiting, chest pain or shortness of breath. Objective Vital signs in last 24 hours: Vitals:   06/03/24 0751 06/03/24 0800 06/03/24 0900 06/03/24 1000  BP:   (!) 130/48   Pulse:  74 68 67  Resp:  15 16 16   Temp: 98.3 F (36.8 C)     TempSrc: Oral     SpO2:  96% 96% 96%  Weight:      Height:       Weight change: -10.5 kg  Intake/Output Summary (Last 24 hours) at 06/03/2024  1117 Last data filed at 06/03/2024 0906 Gross per 24 hour  Intake 483 ml  Output 1400 ml  Net -917 ml       Labs: RENAL PANEL Recent Labs  Lab 06/02/24 0251 06/02/24 0742 06/03/24 0418  NA 142  --  138  K 6.4* 6.3* 4.9  CL 108  --  97*  CO2 18*  --  26  GLUCOSE 89  --  105*  BUN 90*  --  56*  CREATININE 10.13*  --  6.82*  CALCIUM  9.0  --  8.9  PHOS  --   --  7.9*  ALBUMIN  3.0*  --  2.9*    Liver Function Tests: Recent Labs  Lab 06/02/24 0251 06/03/24 0418  AST 12*  --   ALT 7  --   ALKPHOS 53  --   BILITOT 1.1  --   PROT 6.5  --   ALBUMIN  3.0* 2.9*   No results for input(s): LIPASE, AMYLASE in the last 168 hours. No results for input(s): AMMONIA in the last 168 hours. CBC: Recent Labs    09/10/23 0749 09/10/23 1057 05/06/24 1211 05/07/24 0301 05/08/24 0330 05/09/24 0504 06/02/24 0251 06/03/24 0418  HGB 7.0*   < >  --  10.4* 10.9* 11.5* 9.3* 11.0*  MCV 90.9   < >  --  94.1 92.6 90.7 94.8 93.8  VITAMINB12  --   --  361  --   --   --   --   --  FOLATE  --   --  6.3  --   --   --   --   --   TIBC 195*  --   --   --   --   --   --   --   IRON  45  --   --   --   --   --   --   --    < > = values in this interval not displayed.    Cardiac Enzymes: Recent Labs  Lab 06/02/24 0251  CKTOTAL 197   CBG: Recent Labs  Lab 06/02/24 2025 06/03/24 0036 06/03/24 0429 06/03/24 0739 06/03/24 1115  GLUCAP 99 137* 110* 112* 193*    Iron  Studies: No results for input(s): IRON , TIBC, TRANSFERRIN, FERRITIN in the last 72 hours. Studies/Results: DG Shoulder Right Result Date: 06/02/2024 CLINICAL DATA:  Recent fall with right shoulder pain, initial encounter EXAM: RIGHT SHOULDER - 2+ VIEW COMPARISON:  None Available. FINDINGS: Mild degenerative changes of the acromioclavicular joint are seen. Humeral head is high-riding likely related to chronic rotator cuff injury. Some remodeling of the acromion is seen. No acute fracture or dislocation is noted.  IMPRESSION: Humeral head likely related to chronic rotator cuff injury. Electronically Signed   By: Oneil Devonshire M.D.   On: 06/02/2024 03:45   CT Head Wo Contrast Result Date: 06/02/2024 CLINICAL DATA:  Fall yesterday with headaches and neck pain, initial encounter EXAM: CT HEAD WITHOUT CONTRAST CT CERVICAL SPINE WITHOUT CONTRAST TECHNIQUE: Multidetector CT imaging of the head and cervical spine was performed following the standard protocol without intravenous contrast. Multiplanar CT image reconstructions of the cervical spine were also generated. RADIATION DOSE REDUCTION: This exam was performed according to the departmental dose-optimization program which includes automated exposure control, adjustment of the mA and/or kV according to patient size and/or use of iterative reconstruction technique. COMPARISON:  None Available. FINDINGS: CT HEAD FINDINGS Brain: No evidence of acute infarction, hemorrhage, hydrocephalus, extra-axial collection or mass lesion/mass effect. Mild chronic white matter ischemic changes are noted. Vascular: No hyperdense vessel or unexpected calcification. Skull: Normal. Negative for fracture or focal lesion. Sinuses/Orbits: No acute finding. Other: None. CT CERVICAL SPINE FINDINGS Alignment: Within normal limits. Skull base and vertebrae: 7 cervical segments are well visualized. Prior fusion at C4-5 is noted with anterior fixation. Posterior fixation extending from C4 to T1 is noted bilaterally. The odontoid is within normal limits. No acute fracture or acute facet abnormality is noted. Multilevel osteophytic change and facet hypertrophic changes are noted. Soft tissues and spinal canal: Surrounding soft tissue structures are within normal limits. Upper chest: Visualized lung apices are within normal limits. Other: None IMPRESSION: CT of the head: Chronic white matter ischemic changes. No acute abnormality noted. CT of cervical spine: Changes of prior cervical fusion are seen. Mild  degenerative changes are noted. No acute abnormality noted. Electronically Signed   By: Oneil Devonshire M.D.   On: 06/02/2024 03:36   CT Cervical Spine Wo Contrast Result Date: 06/02/2024 CLINICAL DATA:  Fall yesterday with headaches and neck pain, initial encounter EXAM: CT HEAD WITHOUT CONTRAST CT CERVICAL SPINE WITHOUT CONTRAST TECHNIQUE: Multidetector CT imaging of the head and cervical spine was performed following the standard protocol without intravenous contrast. Multiplanar CT image reconstructions of the cervical spine were also generated. RADIATION DOSE REDUCTION: This exam was performed according to the departmental dose-optimization program which includes automated exposure control, adjustment of the mA and/or kV according to patient size and/or use of iterative  reconstruction technique. COMPARISON:  None Available. FINDINGS: CT HEAD FINDINGS Brain: No evidence of acute infarction, hemorrhage, hydrocephalus, extra-axial collection or mass lesion/mass effect. Mild chronic white matter ischemic changes are noted. Vascular: No hyperdense vessel or unexpected calcification. Skull: Normal. Negative for fracture or focal lesion. Sinuses/Orbits: No acute finding. Other: None. CT CERVICAL SPINE FINDINGS Alignment: Within normal limits. Skull base and vertebrae: 7 cervical segments are well visualized. Prior fusion at C4-5 is noted with anterior fixation. Posterior fixation extending from C4 to T1 is noted bilaterally. The odontoid is within normal limits. No acute fracture or acute facet abnormality is noted. Multilevel osteophytic change and facet hypertrophic changes are noted. Soft tissues and spinal canal: Surrounding soft tissue structures are within normal limits. Upper chest: Visualized lung apices are within normal limits. Other: None IMPRESSION: CT of the head: Chronic white matter ischemic changes. No acute abnormality noted. CT of cervical spine: Changes of prior cervical fusion are seen. Mild  degenerative changes are noted. No acute abnormality noted. Electronically Signed   By: Oneil Devonshire M.D.   On: 06/02/2024 03:36    Medications: Infusions:   Scheduled Medications:  aspirin   81 mg Oral Daily   budesonide -glycopyrrolate -formoterol   2 puff Inhalation BID   carvedilol   6.25 mg Oral BID WC   Chlorhexidine  Gluconate Cloth  6 each Topical Q0600   heparin   5,000 Units Subcutaneous Q8H   insulin  aspart  0-5 Units Subcutaneous QHS   insulin  aspart  0-6 Units Subcutaneous TID WC   sodium chloride  flush  3 mL Intravenous Q12H    have reviewed scheduled and prn medications.  Physical Exam: General:NAD, comfortable Heart:RRR, s1s2 nl Lungs:clear b/l, no crackle Abdomen:soft, Non-tender, non-distended Extremities:No edema Dialysis Access: AV fistula for the access.  Raynald Rouillard Prasad Mateus Rewerts 06/03/2024,11:17 AM  LOS: 1 day

## 2024-06-04 LAB — GLUCOSE, CAPILLARY
Glucose-Capillary: 108 mg/dL — ABNORMAL HIGH (ref 70–99)
Glucose-Capillary: 147 mg/dL — ABNORMAL HIGH (ref 70–99)
Glucose-Capillary: 94 mg/dL (ref 70–99)
Glucose-Capillary: 145 mg/dL — ABNORMAL HIGH (ref 70–99)

## 2024-06-04 MED ORDER — LOPERAMIDE HCL 2 MG PO CAPS
2.0000 mg | ORAL_CAPSULE | ORAL | Status: DC | PRN
  Administered 2024-06-04 – 2024-06-06 (×3): 2 mg via ORAL
  Filled 2024-06-04 (×3): qty 1

## 2024-06-04 MED ORDER — CALCITRIOL 0.25 MCG PO CAPS
0.2500 ug | ORAL_CAPSULE | Freq: Every day | ORAL | Status: DC
  Administered 2024-06-05 – 2024-06-08 (×4): 0.25 ug via ORAL
  Filled 2024-06-04 (×4): qty 1

## 2024-06-04 MED ORDER — ALPRAZOLAM 0.25 MG PO TABS
0.2500 mg | ORAL_TABLET | Freq: Two times a day (BID) | ORAL | Status: DC | PRN
  Administered 2024-06-06 – 2024-06-08 (×2): 0.25 mg via ORAL
  Filled 2024-06-04 (×2): qty 1

## 2024-06-04 NOTE — Progress Notes (Addendum)
 06/04/2024 4:10 PM  DC was held from 8/1 due to patient changing his mind and deciding to go to SNF for rehab at the last minute when he had refused it many times before.  Now we are working on trying to find a SNF bed, and make sure they will be able to get him to dialysis and insurance approves.  Next dialysis is due on Mon 06/06/24.    I spoke with both of his sisters by telephone today and updated them and they verbalized understanding.   KYM FREDRIK Louder, MD How to contact the [facility] Attending or Consulting provider 7A - 7P or covering provider during after hours 7P -7A, for this patient?  Check the care team in [facility] and look for a) attending/consulting TRH provider listed and b) the TRH team listed Log into [email or web address] and use [place]'s universal password to access. If you do not have the password, please contact the hospital operator. Locate the [facility] provider you are looking for under [facility] and page to a number that you can be directly reached. If you still have difficulty reaching the provider, please page the [facility] (Director on Call) for the Hospitalists listed on amion for assistance.

## 2024-06-04 NOTE — Plan of Care (Signed)
  Problem: Coping: Goal: Ability to adjust to condition or change in health will improve Outcome: Progressing   Problem: Fluid Volume: Goal: Ability to maintain a balanced intake and output will improve Outcome: Progressing   Problem: Health Behavior/Discharge Planning: Goal: Ability to manage health-related needs will improve Outcome: Progressing   Problem: Education: Goal: Knowledge of General Education information will improve Description: Including pain rating scale, medication(s)/side effects and non-pharmacologic comfort measures Outcome: Progressing

## 2024-06-04 NOTE — Progress Notes (Signed)
 Mobility Specialist Progress Note:    06/04/24 1000  Mobility  Activity Ambulated with assistance  Level of Assistance Standby assist, set-up cues, supervision of patient - no hands on  Assistive Device Front wheel walker  Distance Ambulated (ft) 50 ft  Range of Motion/Exercises Active;All extremities  Activity Response Tolerated well  Mobility Referral Yes  Mobility visit 1 Mobility  Mobility Specialist Start Time (ACUTE ONLY) 1000  Mobility Specialist Stop Time (ACUTE ONLY) 1020  Mobility Specialist Time Calculation (min) (ACUTE ONLY) 20 min   Pt received in bed, agreeable to mobility. Required SBA to stand and ambulate with RW. Tolerated well, little off balance at start, improved while ambulating. Left pt in chair, all needs met.  Ova Gillentine Mobility Specialist Please contact via Special educational needs teacher or  Rehab office at [phone]

## 2024-06-04 NOTE — Plan of Care (Signed)
  Problem: Education: Goal: Ability to describe self-care measures that may prevent or decrease complications (Diabetes Survival Skills Education) will improve Outcome: Progressing   Problem: Skin Integrity: Goal: Risk for impaired skin integrity will decrease Outcome: Progressing   Problem: Pain Managment: Goal: General experience of comfort will improve and/or be controlled Outcome: Progressing   Problem: Safety: Goal: Ability to remain free from injury will improve Outcome: Progressing

## 2024-06-04 NOTE — Plan of Care (Signed)

## 2024-06-05 LAB — GLUCOSE, CAPILLARY
Glucose-Capillary: 93 mg/dL (ref 70–99)
Glucose-Capillary: 106 mg/dL — ABNORMAL HIGH (ref 70–99)
Glucose-Capillary: 134 mg/dL — ABNORMAL HIGH (ref 70–99)
Glucose-Capillary: 106 mg/dL — ABNORMAL HIGH (ref 70–99)
Glucose-Capillary: 91 mg/dL (ref 70–99)

## 2024-06-05 NOTE — Plan of Care (Signed)
  Problem: Education: Goal: Ability to describe self-care measures that may prevent or decrease complications (Diabetes Survival Skills Education) will improve Outcome: Progressing   Problem: Coping: Goal: Ability to adjust to condition or change in health will improve Outcome: Progressing   Problem: Nutritional: Goal: Progress toward achieving an optimal weight will improve Outcome: Progressing   Problem: Coping: Goal: Level of anxiety will decrease Outcome: Progressing

## 2024-06-05 NOTE — Progress Notes (Signed)
 06/05/2024 10:27 AM  DC was held from 8/1 due to patient changing his mind and deciding to go to SNF for rehab at the last minute when he had refused it many times before.  Now we are working on trying to find a SNF bed, and make sure they will be able to get him to dialysis.  Next dialysis is due on Mon 06/06/24.  Updated nephrology team that DC was delayed and he will need HD tomorrow in hospital.    I spoke with both of his sisters by telephone and updated them and they verbalized understanding.   KYM FREDRIK Louder, MD How to contact the [facility] Attending or Consulting provider 7A - 7P or covering provider during after hours 7P -7A, for this patient?  Check the care team in [facility] and look for a) attending/consulting TR[facility] provider listed and b) the TR[facility] team listed Log into [email or web address] and use [place]'s universal password to access. If you do not have the password, please contact the hospital operator. Locate the [facility] provider you are looking for under [facility] and page to a number that you can be directly reached. If you still have difficulty reaching the provider, please page the [facility] (Director on Call) for the Hospitalists listed on amion for assistance.

## 2024-06-05 NOTE — TOC CM/SW Note (Signed)
 [facility] has their maximum number of patients on HD. [facility] is able to offer a bed on the condition that the patient will have to move his HD from [facility] (M-W-F) to a HD center in [place]. Pt is agreeable. TOC will have to inquire about chair times and insurance acceptance at [facility] on Monday.

## 2024-06-05 NOTE — Progress Notes (Signed)
 Patient has been stable during the night. Pt has however refused to have his bed alarm put on this am stating No I dont want that I'll be fine. This Clinical research associate did supervise patient getting up from bed to ambulate to the bathroom to void. Pt did very well with ambulation. Pts gait is slow but is steady and pts strength appears to be improving. Pt did have some loose stools during the night in which he has had for years and 3 GI doctors cannot figure out what is wrong with me so this writer did medicate him twice with PRN Immodium for the loose stools. Pt has been medicated with pain medication during the night as well. Pt does become forgetful at times. This Clinical research associate did explain the safety risk that the patient was placing himself at by not having his bed alarm on and went into very thorough explanation regarding safety awareness due to recent falls and weakness. Pt acknowledged, understood and still stated he did not want his bed alarm on. Pt does have on non skid socks and bed is low in position with the wheels locked. Floor remains free from clutter. Pt has been using walker when ambulating. Will monitor and report to oncoming shift.

## 2024-06-06 LAB — RENAL FUNCTION PANEL
Albumin: 2.9 g/dL — ABNORMAL LOW (ref 3.5–5.0)
Anion gap: 14 (ref 5–15)
BUN: 88 mg/dL — ABNORMAL HIGH (ref 8–23)
CO2: 20 mmol/L — ABNORMAL LOW (ref 22–32)
Calcium: 8.6 mg/dL — ABNORMAL LOW (ref 8.9–10.3)
Chloride: 103 mmol/L (ref 98–111)
Creatinine, Ser: 8.72 mg/dL — ABNORMAL HIGH (ref 0.61–1.24)
GFR, Estimated: 6 mL/min — ABNORMAL LOW (ref >60)
Glucose, Bld: 147 mg/dL — ABNORMAL HIGH (ref 70–99)
Phosphorus: 9.1 mg/dL — ABNORMAL HIGH (ref 2.5–4.6)
Potassium: 5.4 mmol/L — ABNORMAL HIGH (ref 3.5–5.1)
Sodium: 137 mmol/L (ref 135–145)

## 2024-06-06 LAB — CBC
HCT: 31.8 % — ABNORMAL LOW (ref 39.0–52.0)
Hemoglobin: 10.1 g/dL — ABNORMAL LOW (ref 13.0–17.0)
MCH: 29.8 pg (ref 26.0–34.0)
MCHC: 31.8 g/dL (ref 30.0–36.0)
MCV: 93.8 fL (ref 80.0–100.0)
Platelets: 321 K/uL (ref 150–400)
RBC: 3.39 MIL/uL — ABNORMAL LOW (ref 4.22–5.81)
RDW: 14.1 % (ref 11.5–15.5)
WBC: 6.9 K/uL (ref 4.0–10.5)
nRBC: 0 % (ref 0.0–0.2)

## 2024-06-06 LAB — GLUCOSE, CAPILLARY
Glucose-Capillary: 82 mg/dL (ref 70–99)
Glucose-Capillary: 120 mg/dL — ABNORMAL HIGH (ref 70–99)
Glucose-Capillary: 106 mg/dL — ABNORMAL HIGH (ref 70–99)

## 2024-06-06 MED ORDER — CALCIUM ACETATE (PHOS BINDER) 667 MG PO CAPS
667.0000 mg | ORAL_CAPSULE | Freq: Three times a day (TID) | ORAL | Status: DC
Start: 2024-06-06 — End: 2024-06-08
  Administered 2024-06-06 – 2024-06-08 (×6): 667 mg via ORAL
  Filled 2024-06-06 (×6): qty 1

## 2024-06-06 MED ORDER — TRIPLE ANTIBIOTIC 3.5-400-5000 EX OINT
TOPICAL_OINTMENT | Freq: Three times a day (TID) | CUTANEOUS | Status: DC
  Administered 2024-06-06 – 2024-06-07 (×3): 1 via TOPICAL
  Filled 2024-06-06 (×4): qty 1

## 2024-06-06 NOTE — Progress Notes (Signed)
 Navigator reached out to Case Manager regarding this pt and what assistance is needed. Completed a referral to [facility] admission at this time for [facility] placement. Notified [facility] of possible transfer of care, as well as [facility]. Contacted case Production designer, theatre/television/film with update.   Leydi Winstead Dialysis Navigator [phone]

## 2024-06-06 NOTE — Progress Notes (Signed)
 06/06/2024 3:48 PM  DC was held from 8/1 due to patient changing his mind and deciding to go to SNF for rehab at the last minute when he had refused it many times before.  Now we are working on trying to find a SNF bed, and make sure they will be able to get him to dialysis.  Next dialysis is due on Mon 06/06/24.  Updated nephrology team that DC was delayed and they are arranging for HD today on schedule. Continue current management.     I spoke with both of his sisters by telephone and updated them and they verbalized understanding.   KYM FREDRIK Louder, MD How to contact the [facility] Attending or Consulting provider 7A - 7P or covering provider during after hours 7P -7A, for this patient?  Check the care team in [facility] and look for a) attending/consulting TRH provider listed and b) the TRH team listed Log into [email or web address] and use [place]'s universal password to access. If you do not have the password, please contact the hospital operator. Locate the [facility] provider you are looking for under [facility] and page to a number that you can be directly reached. If you still have difficulty reaching the provider, please page the [facility] (Director on Call) for the Hospitalists listed on amion for assistance.

## 2024-06-06 NOTE — Progress Notes (Addendum)
 Occupational Therapy Treatment Patient Details Name: Curtis Hartman MRN: [id number] DOB: 08/26/1961 Today's Date: 06/06/2024   History of present illness Curtis Hartman is a 63 y.o. male with medical history significant for hypertension, type 2 diabetes mellitus, COPD, ESRD on hemodialysis, recent CVA, and chronic pain who presents with lethargy and generalized weakness after a fall at home.      Patient fell at home last night at approximately 8:30 PM per report of his sister who encouraged him to call EMS but he refused.  He was then found sleeping on the floor at 2 AM and EMS was called.  Patient reports that he had been fatigued, feeling generally weak, slipped and fell, and was unable to get up on his own.  He denies any chest pain, palpitations, focal weakness, fever, or chills.     Patient states that he was last dialyzed on 05/27/2024 and has missed his last 2 sessions.     He had a similar presentation 4 weeks ago, was instructed to discontinue morphine  at that time, but continues to take 30 mg morphine  sulfate IR twice daily.   OT comments  Pt agreeable to OT treatment. Pt completed B UE AA/ROM seated using a cane and therapist assist for support to reach full range. L UE quite limited compared to R in terms of A/ROM at the shoulder. Pt given HEP on shoulder AA/ROM and educated to try in supine first. Able to ambulate in room and hall with RW and CGA to supervision. Pt left in the bed with call bell within reach. Pt also given an exercise band per his request. Pt will benefit from continued OT in the hospital and recommended venue below to increase strength, balance, and endurance for safe ADL's.         If plan is discharge home, recommend the following:  A little help with walking and/or transfers;A little help with bathing/dressing/bathroom;Assistance with cooking/housework;Assist for transportation;Help with stairs or ramp for entrance   Equipment Recommendations  None recommended by  OT          Precautions / Restrictions Precautions Precautions: Fall Recall of Precautions/Restrictions: Intact Restrictions Weight Bearing Restrictions Per Provider Order: No       Mobility Bed Mobility                    Transfers Overall transfer level: Needs assistance Equipment used: Rolling walker (2 wheels) Transfers: Sit to/from Stand Sit to Stand: Supervision, Contact guard assist           General transfer comment: Ambulatory transfers today. Sit to stands from EOB and toilet with RW.     Balance Overall balance assessment: Needs assistance Sitting-balance support: Feet supported, No upper extremity supported Sitting balance-Leahy Scale: Good Sitting balance - Comments: seated EOB   Standing balance support: During functional activity, Reliant on assistive device for balance, Bilateral upper extremity supported Standing balance-Leahy Scale: Fair Standing balance comment: using RW                           ADL either performed or assessed with clinical judgement   ADL Overall ADL's : Needs assistance/impaired                         Toilet Transfer: Contact guard assist;Supervision/safety;Rolling walker (2 wheels);Ambulation Toilet Transfer Details (indicate cue type and reason): EOB to toilet to ambulation in the hall before returning to  EOB.         Functional mobility during ADLs: Supervision/safety;Contact guard assist;Rolling walker (2 wheels) General ADL Comments: Able to ambualte over 60 feet in the hall with RW; slow pace and supervision to CGA for safety.    Extremity/Trunk Assessment Upper Extremity Assessment Upper Extremity Assessment: RUE deficits/detail;LUE deficits/detail RUE Deficits / Details: 3-/5 shoulder flexion again today. LUE Deficits / Details: 3-/5 shoulder flexion, but more limited in A/ROM than R UE.                             Communication Communication Communication: No  apparent difficulties   Cognition Arousal: Alert Behavior During Therapy: WFL for tasks assessed/performed Cognition: No apparent impairments                               Following commands: Intact        Cueing   Cueing Techniques: Verbal cues, Tactile cues, Visual cues  Exercises Exercises: General Upper Extremity General Exercises - Upper Extremity Shoulder Flexion: AAROM, 10 reps, Seated, Both Shoulder ABduction: AAROM, Both, 10 reps, Seated (x10 external rotation and protraction AA/ROM as well using cane and with additional support from this OT as needed.) Shoulder Horizontal ABduction: 5 reps, AAROM, Both, Seated                 Pertinent Vitals/ Pain       Pain Assessment Pain Assessment: Faces Faces Pain Scale: Hurts a little bit Pain Location: L shoulder with AAROM Pain Descriptors / Indicators: Grimacing Pain Intervention(s): Monitored during session                                                          Frequency  Min 2X/week        Progress Toward Goals  OT Goals(current goals can now be found in the care plan section)  Progress towards OT goals: Progressing toward goals  Acute Rehab OT Goals Patient Stated Goal: improve function OT Goal Formulation: With patient Time For Goal Achievement: 06/17/24 Potential to Achieve Goals: Good ADL Goals Pt Will Perform Grooming: with modified independence Pt Will Perform Lower Body Bathing: with modified independence Pt Will Perform Lower Body Dressing: with modified independence Pt Will Perform Toileting - Clothing Manipulation and hygiene: with modified independence Pt/caregiver will Perform Home Exercise Program: Increased ROM;Increased strength;Independently  Plan                                      End of Session Equipment Utilized During Treatment: Rolling walker (2 wheels)  OT Visit Diagnosis: Other abnormalities of gait and mobility  (R26.89);Unsteadiness on feet (R26.81);Muscle weakness (generalized) (M62.81);History of falling (Z91.81);Repeated falls (R29.6)   Activity Tolerance Patient tolerated treatment well   Patient Left in bed;with call bell/phone within reach   Nurse Communication          Time: 1406-1430 OT Time Calculation (min): 24 min  Charges: OT General Charges $OT Visit: 1 Visit OT Treatments $Therapeutic Exercise: 23-37 mins  Chad Tiznado OT, MOT   Jayson Person 06/06/2024, 2:46 PM

## 2024-06-06 NOTE — TOC Progression Note (Signed)
 Transition of Care [facility]) - Progression Note    Patient Details  Name: Curtis Hartman MRN: [id number] Date of Birth: [birth year]  Transition of [facility] CM/SW Contact  Sharlyne Stabs, RN Phone Number: 06/06/2024, 9:58 AM  Clinical Narrative:   [facility] accepted and starting INS AUTH. HD Navigator is working on chair time in [place].    Expected Discharge Plan: Skilled Nursing Facility Barriers to Discharge: SNF Pending bed offer     Expected Discharge Plan and Services In-house Referral: Clinical Social Work Discharge Planning Services: CM Consult Post Acute Care Choice: Durable Medical Equipment Living arrangements for the past 2 months: Single Family Home Expected Discharge Date: 06/03/24                    Social Drivers of Health (SDOH) Interventions SDOH Screenings   Food Insecurity: No Food Insecurity (06/02/2024)  Housing: Low Risk  (06/02/2024)  Transportation Needs: No Transportation Needs (06/02/2024)  Utilities: Not At Risk (06/02/2024)  Financial Resource Strain: High Risk (01/15/2024)   Received from [facility]  Physical Activity: Unknown (01/15/2024)   Received from [facility]  Social Connections: Socially Isolated (06/02/2024)  Stress: Stress Concern Present (02/29/2024)   Received from [facility]  Tobacco Use: High Risk (06/02/2024)    Readmission Risk Interventions    06/04/2024    9:37 AM 06/03/2024    2:25 PM 06/03/2024   12:50 PM  Readmission Risk Prevention Plan  Transportation Screening Complete Complete Complete  Medication Review Oceanographer) Complete Complete Complete  HRI or Home Care Consult Complete Complete Complete  SW Recovery Care/Counseling Consult Complete Complete Complete  Palliative Care Screening Not Applicable Not Applicable Not Applicable  Skilled Nursing Facility Complete Complete Not Applicable

## 2024-06-06 NOTE — Progress Notes (Signed)
 [facility] NEPHROLOGY PROGRESS NOTE  Assessment/ Plan: Pt is a 63 y.o. yo male  with past medical history significant for hypertension, type 2 diabetes, dyslipidemia, stroke, ESRD on HD who was presented after the fall associated with lethargy and generalized weakness, seen as a consultation for the management of ESRD.   Outpatient Dialysis Orders from recent hospitalization:  [facility], MWF, Elisio 17H Time 4:00, edw 62.5kg, LUE AVF BFR 350, DFR 600, 2K/2.5Ca bath Heparin  1000 units bolus then 1000 units/hr   # ESRD MWF at [facility]: Continue HD per MWF schedule   # Hypertension/volume: BP acceptable.  Continue home antihypertensives and UF with HD.   # Anemia of ESRD: Hemoglobin at goal.  CTM. No esa   # CKD-metabolic Bone Disease/hyperphosphatemia: Phos high. Ca corrects to normal. Binders hadn't been ordered. Reordered today. Continue home calcitriol   #Weakness: awaiting SNF  Ok to discharge from renal perspective.  Awaiting SNF  Subjective: Feels well no complaints other than fatigue  Objective Vital signs in last 24 hours: Vitals:   06/05/24 2023 06/05/24 2046 06/06/24 0417 06/06/24 0704  BP: (!) 173/77 (!) 162/85 133/70   Pulse: 83 82 77   Resp: 16  20   Temp: 98.6 F (37 C)  (!) 97.5 F (36.4 C)   TempSrc: Oral  Oral   SpO2: 99%  98% 98%  Weight:      Height:       Weight change:   Intake/Output Summary (Last 24 hours) at 06/06/2024 1135 Last data filed at 06/05/2024 1700 Gross per 24 hour  Intake 600 ml  Output --  Net 600 ml       Labs: RENAL PANEL Recent Labs  Lab 06/02/24 0251 06/02/24 0742 06/03/24 0418 06/06/24 0314  NA 142  --  138 137  K 6.4* 6.3* 4.9 5.4*  CL 108  --  97* 103  CO2 18*  --  26 20*  GLUCOSE 89  --  105* 147*  BUN 90*  --  56* 88*  CREATININE 10.13*  --  6.82* 8.72*  CALCIUM  9.0  --  8.9 8.6*  PHOS  --   --  7.9* 9.1*  ALBUMIN  3.0*  --  2.9* 2.9*    Liver Function Tests: Recent Labs  Lab 06/02/24 0251  06/03/24 0418 06/06/24 0314  AST 12*  --   --   ALT 7  --   --   ALKPHOS 53  --   --   BILITOT 1.1  --   --   PROT 6.5  --   --   ALBUMIN  3.0* 2.9* 2.9*   No results for input(s): LIPASE, AMYLASE in the last 168 hours. No results for input(s): AMMONIA in the last 168 hours. CBC: Recent Labs    09/10/23 0749 09/10/23 1057 05/06/24 1211 05/07/24 0301 05/08/24 0330 05/09/24 0504 06/02/24 0251 06/03/24 0418 06/06/24 0314  HGB 7.0*   < >  --    < > 10.9* 11.5* 9.3* 11.0* 10.1*  MCV 90.9   < >  --    < > 92.6 90.7 94.8 93.8 93.8  VITAMINB12  --   --  361  --   --   --   --   --   --   FOLATE  --   --  6.3  --   --   --   --   --   --   TIBC 195*  --   --   --   --   --   --   --   --  IRON  45  --   --   --   --   --   --   --   --    < > = values in this interval not displayed.    Cardiac Enzymes: Recent Labs  Lab 06/02/24 0251  CKTOTAL 197   CBG: Recent Labs  Lab 06/05/24 1114 06/05/24 1612 06/05/24 2029 06/06/24 0736 06/06/24 1112  GLUCAP 134* 106* 91 82 120*    Iron  Studies: No results for input(s): IRON , TIBC, TRANSFERRIN, FERRITIN in the last 72 hours. Studies/Results: No results found.   Medications: Infusions:   Scheduled Medications:  aspirin   81 mg Oral Daily   budesonide -glycopyrrolate -formoterol   2 puff Inhalation BID   calcitRIOL   0.25 mcg Oral Daily   carvedilol   6.25 mg Oral BID WC   Chlorhexidine  Gluconate Cloth  6 each Topical Q0600   heparin   5,000 Units Subcutaneous Q8H   insulin  aspart  0-6 Units Subcutaneous TID WC   neomycin -bacitracin -polymyxin   Topical TID   sodium chloride  flush  3 mL Intravenous Q12H    have reviewed scheduled and prn medications.  Physical Exam: General:NAD, comfortable Heart:normal rate, no rub Lungs:bilateral chest rise with no iwob Abdomen:soft, Non-tender, non-distended Extremities:No edema Dialysis Access: AV fistula   Jayson PARAS Freya Zobrist 06/06/2024,11:35 AM  LOS: 4 days

## 2024-06-06 NOTE — Procedures (Signed)
 HD Note:  Some information was entered later than the data was gathered due to patient care needs. The stated time with the data is accurate.  Received patient in bed to unit.   Alert and oriented.   Informed consent signed and in chart.   Access used: Upper left arm fistula Access issues: none  Patient tolerated treatment well. No issues until patient awoke suddenly and decided he had to stop treatment. He said he didn't feel well.  There was no further explination of   TX duration: 2 hours 34 minutes  Alert, without acute distress.  Total UF removed: 1300 ml  Hand-off given to patient's nurse.   Transported back to the room   Tauheedah Bok L. Lenon, RN Kidney Dialysis Unit.

## 2024-06-06 NOTE — Plan of Care (Signed)
   Problem: Education: Goal: Ability to describe self-care measures that may prevent or decrease complications (Diabetes Survival Skills Education) will improve Outcome: Progressing Goal: Individualized Educational Video(s) Outcome: Progressing

## 2024-06-06 NOTE — Progress Notes (Signed)
 Pt had an uneventful night. Pt slept most of the night without incident. Awakened requesting pain meds for back pain. Pt ambulated from bed to chair sitting upright and watched television. Pt also ambulated independently to bathroom utilizing front wheel walker and non skid socks. Pt commented he wanted to get better.Will continue to monitor for changes.

## 2024-06-06 NOTE — Patient Instructions (Signed)

## 2024-06-06 NOTE — Plan of Care (Signed)
  Problem: Metabolic: Goal: Ability to maintain appropriate glucose levels will improve Outcome: Progressing   Problem: Skin Integrity: Goal: Risk for impaired skin integrity will decrease Outcome: Progressing   Problem: Tissue Perfusion: Goal: Adequacy of tissue perfusion will improve Outcome: Progressing   Problem: Education: Goal: Knowledge of General Education information will improve Description: Including pain rating scale, medication(s)/side effects and non-pharmacologic comfort measures Outcome: Progressing   Problem: Coping: Goal: Level of anxiety will decrease Outcome: Progressing

## 2024-06-07 LAB — RENAL FUNCTION PANEL
Albumin: 2.7 g/dL — ABNORMAL LOW (ref 3.5–5.0)
Anion gap: 13 (ref 5–15)
BUN: 53 mg/dL — ABNORMAL HIGH (ref 8–23)
CO2: 26 mmol/L (ref 22–32)
Calcium: 8.7 mg/dL — ABNORMAL LOW (ref 8.9–10.3)
Chloride: 97 mmol/L — ABNORMAL LOW (ref 98–111)
Creatinine, Ser: 5.51 mg/dL — ABNORMAL HIGH (ref 0.61–1.24)
GFR, Estimated: 11 mL/min — ABNORMAL LOW (ref >60)
Glucose, Bld: 148 mg/dL — ABNORMAL HIGH (ref 70–99)
Phosphorus: 5.7 mg/dL — ABNORMAL HIGH (ref 2.5–4.6)
Potassium: 4.5 mmol/L (ref 3.5–5.1)
Sodium: 136 mmol/L (ref 135–145)

## 2024-06-07 LAB — GLUCOSE, CAPILLARY
Glucose-Capillary: 152 mg/dL — ABNORMAL HIGH (ref 70–99)
Glucose-Capillary: 110 mg/dL — ABNORMAL HIGH (ref 70–99)
Glucose-Capillary: 164 mg/dL — ABNORMAL HIGH (ref 70–99)
Glucose-Capillary: 157 mg/dL — ABNORMAL HIGH (ref 70–99)
Glucose-Capillary: 151 mg/dL — ABNORMAL HIGH (ref 70–99)

## 2024-06-07 NOTE — Plan of Care (Signed)
   Problem: Education: Goal: Ability to describe self-care measures that may prevent or decrease complications (Diabetes Survival Skills Education) will improve Outcome: Progressing Goal: Individualized Educational Video(s) Outcome: Progressing   Problem: Coping: Goal: Ability to adjust to condition or change in health will improve Outcome: Progressing

## 2024-06-07 NOTE — Progress Notes (Addendum)
 Physical Therapy Treatment Patient Details Name: Curtis Hartman MRN: [id number] DOB: 1961/08/03 Today's Date: 06/07/2024   History of Present Illness Curtis Hartman is a 63 y.o. male with medical history significant for hypertension, type 2 diabetes mellitus, COPD, ESRD on hemodialysis, recent CVA, and chronic pain who presents with lethargy and generalized weakness after a fall at home.      Patient fell at home last night at approximately 8:30 PM per report of his sister who encouraged him to call EMS but he refused.  He was then found sleeping on the floor at 2 AM and EMS was called.  Patient reports that he had been fatigued, feeling generally weak, slipped and fell, and was unable to get up on his own.  He denies any chest pain, palpitations, focal weakness, fever, or chills.     Patient states that he was last dialyzed on 05/27/2024 and has missed his last 2 sessions.     He had a similar presentation 4 weeks ago, was instructed to discontinue morphine  at that time, but continues to take 30 mg morphine  sulfate IR twice daily.    PT Comments  Patient agreeable to and tolerates PT session well. Pt received in bed. Mod independent with bed mobility. Required assist to donn short at bedside. Min/CGA once standing and throughout ambulation for unsteadiness and fatigue. Pt performs standing LE exercises with CGA and RW use for balance impairments. Pt ambulates in room and hallway with RW and CGA. Min A needed for steadying when turning with pt tending to keep RW too far. Improves with verbal cues. Patient will benefit from continued skilled physical therapy acutely and in recommended venue in order to address remaining deficits prior to return home to limit fall risk.    If plan is discharge home, recommend the following: A little help with walking and/or transfers;A little help with bathing/dressing/bathroom;Assist for transportation;Help with stairs or ramp for entrance;Assistance with  cooking/housework   Can travel by private vehicle        Equipment Recommendations  None recommended by PT    Recommendations for Other Services       Precautions / Restrictions Precautions Precautions: Fall Recall of Precautions/Restrictions: Intact Restrictions Weight Bearing Restrictions Per Provider Order: No     Mobility  Bed Mobility Overal bed mobility: Needs Assistance Bed Mobility: Supine to Sit, Sit to Supine     Supine to sit: Modified independent (Device/Increase time), Supervision Sit to supine: Supervision, Modified independent (Device/Increase time)   General bed mobility comments: Inc time needed. pt demo slow labored movement    Transfers Overall transfer level: Needs assistance Equipment used: Rolling walker (2 wheels) Transfers: Sit to/from Stand Sit to Stand: Contact guard assist           General transfer comment: 2 STS throughout session. CGA due to mild unsteadiness once on feet    Ambulation/Gait Ambulation/Gait assistance: Contact guard assist, Min assist Gait Distance (Feet): 50 Feet Assistive device: Rolling walker (2 wheels) Gait Pattern/deviations: Step-through pattern, Decreased step length - right, Decreased step length - left, Trunk flexed, Staggering right, Knees buckling, Knee flexed in stance - left, Knee flexed in stance - right Gait velocity: Dec     General Gait Details: Min/CGA throughout ambulation with RW due to pt unstreadiness and inc fatigue. Knee flexed throughout cycle but begins to buckle at times with inc fatigue. Multiple steps and mild staggering with turns in hall and within tight spots in room.   Stairs  Wheelchair Mobility     Tilt Bed    Modified Rankin (Stroke Patients Only)       Balance Overall balance assessment: Needs assistance Sitting-balance support: Feet supported, No upper extremity supported Sitting balance-Leahy Scale: Good Sitting balance - Comments: seated EOB    Standing balance support: During functional activity, Reliant on assistive device for balance, Bilateral upper extremity supported Standing balance-Leahy Scale: Fair Standing balance comment: Poor/fair using RW            Communication Communication Communication: No apparent difficulties  Cognition Arousal: Alert Behavior During Therapy: WFL for tasks assessed/performed   PT - Cognitive impairments: No apparent impairments     Following commands: Intact      Cueing Cueing Techniques: Verbal cues, Visual cues  Exercises General Exercises - Lower Extremity Hip ABduction/ADduction: AROM, Strengthening, Both, 10 reps, Standing, Other (comment) (With RW for support) Hip Flexion/Marching: AROM, Strengthening, Both, 10 reps, Standing, Other (comment) (With RW for support) Heel Raises: AROM, Strengthening, Both, 10 reps, Standing, Other (comment) (With RW for support)    General Comments        Pertinent Vitals/Pain Pain Assessment Pain Assessment: Faces Faces Pain Scale: No hurt    Home Living                          Prior Function            PT Goals (current goals can now be found in the care plan section) Acute Rehab PT Goals Patient Stated Goal: Return home following short rehab stay PT Goal Formulation: With patient Time For Goal Achievement: 06/17/24 Potential to Achieve Goals: Good Progress towards PT goals: Progressing toward goals    Frequency    Min 3X/week      PT Plan      Co-evaluation     PT goals addressed during session: Mobility/safety with mobility;Strengthening/ROM;Balance        AM-PAC PT 6 Clicks Mobility   Outcome Measure  Help needed turning from your back to your side while in a flat bed without using bedrails?: A Little Help needed moving from lying on your back to sitting on the side of a flat bed without using bedrails?: A Little Help needed moving to and from a bed to a chair (including a wheelchair)?: A  Little Help needed standing up from a chair using your arms (e.g., wheelchair or bedside chair)?: A Little Help needed to walk in hospital room?: A Little Help needed climbing 3-5 steps with a railing? : A Little 6 Click Score: 18    End of Session Equipment Utilized During Treatment: Gait belt Activity Tolerance: Patient limited by fatigue;Patient tolerated treatment well Patient left: in bed;with call bell/phone within reach   PT Visit Diagnosis: Unsteadiness on feet (R26.81);Other abnormalities of gait and mobility (R26.89);Repeated falls (R29.6);Muscle weakness (generalized) (M62.81)     Time: 9172-9149 PT Time Calculation (min) (ACUTE ONLY): 23 min  Charges:    $Gait Training: 8-22 mins $Therapeutic Exercise: 8-22 mins PT General Charges $$ ACUTE PT VISIT: 1 Visit                     10:36 AM, 06/07/24 Rosaria Settler, PT, DPT [place] with [facility]

## 2024-06-07 NOTE — Progress Notes (Signed)
 Mobility Specialist Progress Note:    06/07/24 1509  Mobility  Activity Ambulated with assistance;Stood at bedside;Pivoted/transferred from bed to chair  Level of Assistance Contact guard assist, steadying assist  Assistive Device None (HHA)  Distance Ambulated (ft) 10 ft  Range of Motion/Exercises Active;All extremities  Activity Response Tolerated well  Mobility Referral Yes  Mobility visit 1 Mobility  Mobility Specialist Start Time (ACUTE ONLY) 1447  Mobility Specialist Stop Time (ACUTE ONLY) 1508  Mobility Specialist Time Calculation (min) (ACUTE ONLY) 21 min   Pt received in bed, agreeable to mobility. Required CGA to stand and ambulate with hand-held assist. Tolerated well, asx throughout. Left in chair, NT at bedside. All needs met.   Sherrilee Ditty Mobility Specialist Please contact via Special educational needs teacher or  Rehab office at [phone]

## 2024-06-07 NOTE — Progress Notes (Signed)
 06/07/2024 11:33 AM  DC was held from 8/1 due to patient changing his mind and deciding to go to SNF for rehab at the last minute when he had refused it many times before.  Now we are working on trying to find a SNF bed, and make sure they will be able to get him to dialysis.  Next dialysis is due on Wed 06/08/24.  Updated nephrology team that DC was delayed and they are arranging for HD on schedule while he remains in hospital. Continue current management.   See dictated discharge summary. Pt remains eager to go to short term rehab.    I spoke with both of his sisters by telephone and updated them and they verbalized understanding.   KYM FREDRIK Louder, MD How to contact the [facility] Attending or Consulting provider 7A - 7P or covering provider during after hours 7P -7A, for this patient?  Check the care team in [facility] and look for a) attending/consulting TRH provider listed and b) the TRH team listed Log into [email or web address] and use [facility]'s universal password to access. If you do not have the password, please contact the hospital operator. Locate the [facility] provider you are looking for under [facility] and page to a number that you can be directly reached. If you still have difficulty reaching the provider, please page the [facility] (Director on Call) for the Hospitalists listed on amion for assistance.

## 2024-06-07 NOTE — Progress Notes (Signed)
 [facility] NEPHROLOGY PROGRESS NOTE  Assessment/ Plan: Pt is a 63 y.o. yo male  with past medical history significant for hypertension, type 2 diabetes, dyslipidemia, stroke, ESRD on HD who was presented after the fall associated with lethargy and generalized weakness, seen as a consultation for the management of ESRD.   Outpatient Dialysis Orders from recent hospitalization:  [facility], MWF, Elisio 17H Time 4:00, edw 62.5kg, LUE AVF BFR 350, DFR 600, 2K/2.5Ca bath Heparin  1000 units bolus then 1000 units/hr   # ESRD MWF at [facility]: Continue HD per MWF schedule   # Hypertension/volume: BP acceptable.  Continue home antihypertensives and UF with HD.   # Anemia of ESRD: Hemoglobin at goal.  CTM. No esa   # CKD-metabolic Bone Disease/hyperphosphatemia: Phos high. Ca corrects to normal. Continue home by nursing calcitriol    #Weakness: awaiting SNF  Ok to discharge from renal perspective.  Awaiting SNF  Subjective: patient stated he like up on dialysis yesterday anxious and nauseated.  Frequently has nausea and anxiety on dialysis.  States he used to receive the medication at dialysis to help with his anxiety.  Does not know the name but likely a benzodiazepine.  Not something we get care.  Hopefully discharge exam  Objective Vital signs in last 24 hours: Vitals:   06/06/24 2000 06/06/24 2028 06/07/24 0535 06/07/24 0714  BP:  112/75 (!) 164/91   Pulse:  77 70   Resp:  18 17   Temp:  98.6 F (37 C) 99 F (37.2 C)   TempSrc:  Oral Oral   SpO2: 99% 100% 98% 98%  Weight:      Height:       Weight change:   Intake/Output Summary (Last 24 hours) at 06/07/2024 0856 Last data filed at 06/07/2024 0800 Gross per 24 hour  Intake 480 ml  Output 1300 ml  Net -820 ml       Labs: RENAL PANEL Recent Labs  Lab 06/02/24 0251 06/02/24 0742 06/03/24 0418 06/06/24 0314 06/07/24 0502  NA 142  --  138 137 136  K 6.4* 6.3* 4.9 5.4* 4.5  CL 108  --  97* 103 97*  CO2 18*   --  26 20* 26  GLUCOSE 89  --  105* 147* 148*  BUN 90*  --  56* 88* 53*  CREATININE 10.13*  --  6.82* 8.72* 5.51*  CALCIUM  9.0  --  8.9 8.6* 8.7*  PHOS  --   --  7.9* 9.1* 5.7*  ALBUMIN  3.0*  --  2.9* 2.9* 2.7*    Liver Function Tests: Recent Labs  Lab 06/02/24 0251 06/03/24 0418 06/06/24 0314 06/07/24 0502  AST 12*  --   --   --   ALT 7  --   --   --   ALKPHOS 53  --   --   --   BILITOT 1.1  --   --   --   PROT 6.5  --   --   --   ALBUMIN  3.0* 2.9* 2.9* 2.7*   No results for input(s): LIPASE, AMYLASE in the last 168 hours. No results for input(s): AMMONIA in the last 168 hours. CBC: Recent Labs    09/10/23 0749 09/10/23 1057 05/06/24 1211 05/07/24 0301 05/08/24 0330 05/09/24 0504 06/02/24 0251 06/03/24 0418 06/06/24 0314  HGB 7.0*   < >  --    < > 10.9* 11.5* 9.3* 11.0* 10.1*  MCV 90.9   < >  --    < >  92.6 90.7 94.8 93.8 93.8  VITAMINB12  --   --  361  --   --   --   --   --   --   FOLATE  --   --  6.3  --   --   --   --   --   --   TIBC 195*  --   --   --   --   --   --   --   --   IRON  45  --   --   --   --   --   --   --   --    < > = values in this interval not displayed.    Cardiac Enzymes: Recent Labs  Lab 06/02/24 0251  CKTOTAL 197   CBG: Recent Labs  Lab 06/06/24 0736 06/06/24 1112 06/06/24 1946 06/07/24 0400 06/07/24 0713  GLUCAP 82 120* 106* 152* 110*    Iron  Studies: No results for input(s): IRON , TIBC, TRANSFERRIN, FERRITIN in the last 72 hours. Studies/Results: No results found.   Medications: Infusions:   Scheduled Medications:  aspirin   81 mg Oral Daily   budesonide -glycopyrrolate -formoterol   2 puff Inhalation BID   calcitRIOL   0.25 mcg Oral Daily   calcium  acetate  667 mg Oral TID with meals   carvedilol   6.25 mg Oral BID WC   Chlorhexidine  Gluconate Cloth  6 each Topical Q0600   heparin   5,000 Units Subcutaneous Q8H   insulin  aspart  0-6 Units Subcutaneous TID WC   neomycin -bacitracin -polymyxin   Topical  TID   sodium chloride  flush  3 mL Intravenous Q12H    have reviewed scheduled and prn medications.  Physical Exam: General:NAD, comfortable Heart:normal rate, no rub Lungs:bilateral chest rise with no iwob Abdomen:soft, Non-tender, non-distended Extremities:No edema Dialysis Access: AV fistula   Yoshi Vicencio J Tinzley Dalia 06/07/2024,8:56 AM  LOS: 5 days

## 2024-06-08 DIAGNOSIS — F1120 Opioid dependence, uncomplicated: Secondary | ICD-10-CM

## 2024-06-08 DIAGNOSIS — E875 Hyperkalemia: Secondary | ICD-10-CM | POA: Diagnosis not present

## 2024-06-08 DIAGNOSIS — G9340 Encephalopathy, unspecified: Secondary | ICD-10-CM | POA: Diagnosis not present

## 2024-06-08 DIAGNOSIS — N186 End stage renal disease: Secondary | ICD-10-CM | POA: Diagnosis not present

## 2024-06-08 LAB — RENAL FUNCTION PANEL
Albumin: 2.9 g/dL — ABNORMAL LOW (ref 3.5–5.0)
Anion gap: 16 — ABNORMAL HIGH (ref 5–15)
BUN: 68 mg/dL — ABNORMAL HIGH (ref 8–23)
CO2: 22 mmol/L (ref 22–32)
Calcium: 9 mg/dL (ref 8.9–10.3)
Chloride: 97 mmol/L — ABNORMAL LOW (ref 98–111)
Creatinine, Ser: 6.5 mg/dL — ABNORMAL HIGH (ref 0.61–1.24)
GFR, Estimated: 9 mL/min — ABNORMAL LOW (ref >60)
Glucose, Bld: 110 mg/dL — ABNORMAL HIGH (ref 70–99)
Phosphorus: 6.3 mg/dL — ABNORMAL HIGH (ref 2.5–4.6)
Potassium: 4.5 mmol/L (ref 3.5–5.1)
Sodium: 135 mmol/L (ref 135–145)

## 2024-06-08 LAB — GLUCOSE, CAPILLARY
Glucose-Capillary: 149 mg/dL — ABNORMAL HIGH (ref 70–99)
Glucose-Capillary: 110 mg/dL — ABNORMAL HIGH (ref 70–99)
Glucose-Capillary: 140 mg/dL — ABNORMAL HIGH (ref 70–99)

## 2024-06-08 MED ORDER — SALINE SPRAY 0.65 % NA SOLN
1.0000 | NASAL | Status: DC | PRN
  Administered 2024-06-08: 1 via NASAL
  Filled 2024-06-08: qty 44

## 2024-06-08 MED ORDER — OXYCODONE HCL 5 MG PO TABS: 5.0000 mg | ORAL_TABLET | Freq: Four times a day (QID) | ORAL | 0 refills | Status: DC | PRN

## 2024-06-08 NOTE — Progress Notes (Addendum)
 Contacted [facility] admissions for update request regarding chair time at [facility], will continue to assist.    Lavanda Fredrickson Dialysis Navigator [phone]  Addendum 10:02 Received schedule letter. Dates at [facility] will be TTS, chair time 11:30. SW/CM notified.   Addendum 11:02 AVS updated with time and address for first treatment.

## 2024-06-08 NOTE — TOC Transition Note (Signed)
 Transition of Care [facility]) - Discharge Note   Patient Details  Name: Curtis Hartman MRN: [id number] Date of Birth: 1961-08-05  [facility]) CM/SW Contact:  Sharlyne Stabs, RN Phone Number: 06/08/2024, 11:46 AM   Clinical Narrative:   Shara received, TOC confirmed Chair time and updated Isaiah at [place], Room provided. RN called report. TOC printed med necessity and scheduled EMS.     Final next level of care: Skilled Nursing Facility Barriers to Discharge: Barriers Resolved   Patient Goals and CMS Choice Patient states their goals for this hospitalization and ongoing recovery are:: try SNF to get stronger CMS Medicare.gov Compare Post Acute Care list provided to:: Patient Choice offered to / list presented to : Patient      Discharge Placement                Patient to be transferred to facility by: EMS   Patient and family notified of of transfer: 06/08/24  Discharge Plan and Services Additional resources added to the After Visit Summary for   In-house Referral: Clinical Social Work Discharge Planning Services: CM Consult Post Acute Care Choice: Durable Medical Equipment                               Social Drivers of Health (SDOH) Interventions SDOH Screenings   Food Insecurity: No Food Insecurity (06/02/2024)  Housing: Low Risk  (06/02/2024)  Transportation Needs: No Transportation Needs (06/02/2024)  Utilities: Not At Risk (06/02/2024)  Financial Resource Strain: High Risk (01/15/2024)   Received from [facility]  Physical Activity: Unknown (01/15/2024)   Received from [facility]  Social Connections: Socially Isolated (06/02/2024)  Stress: Stress Concern Present (02/29/2024)   Received from [facility]  Tobacco Use: High Risk (06/02/2024)     Readmission Risk Interventions    06/04/2024    9:37 AM 06/03/2024    2:25 PM 06/03/2024   12:50 PM  Readmission Risk Prevention Plan  Transportation Screening Complete Complete Complete   Medication Review Oceanographer) Complete Complete Complete  HRI or Home Care Consult Complete Complete Complete  SW Recovery Care/Counseling Consult Complete Complete Complete  Palliative Care Screening Not Applicable Not Applicable Not Applicable  Skilled Nursing Facility Complete Complete Not Applicable

## 2024-06-08 NOTE — Progress Notes (Signed)
 Called report to [facility]. Spoke to [place] who will be receiving patient. All questions and concerns answered. Awaiting transportation to facility.

## 2024-06-08 NOTE — Discharge Summary (Signed)
 Physician Discharge Summary   Patient: Curtis Hartman MRN: [id number] DOB: 20-Mar-1961  Admit date:     06/02/2024  Discharge date: 06/08/24  Discharge Physician: Alm Kensleigh Gates   PCP: Gerome Tillman LITTIE, FNP   Recommendations at discharge:   Please follow up with primary care provider within 1-2 weeks  Please repeat BMP and CBC in one week     Hospital Course:  63 y.o. male with medical history significant for hypertension, type 2 diabetes mellitus, COPD, ESRD on hemodialysis, recent CVA, and chronic pain who presents with lethargy and generalized weakness after a fall at home.    Patient fell at home last night at approximately 8:30 PM per report of his sister who encouraged him to call EMS but he refused.  He was then found sleeping on the floor at 2 AM and EMS was called.  Patient reports that he had been fatigued, feeling generally weak, slipped and fell, and was unable to get up on his own.  He denies any chest pain, palpitations, focal weakness, fever, or chills.   Patient states that he was last dialyzed on 05/27/2024 and has missed his last 2 sessions.   He had a similar presentation 4 weeks ago, was instructed to discontinue morphine  at that time, but continues to take 30 mg morphine  sulfate IR twice daily.   ED Course: Upon arrival to the ED, patient is found to be afebrile and saturating mid 90s on room air with normal RR, normal HR, and stable BP.  EKG demonstrates sinus rhythm with LAFB.  There are no acute findings on head CT or cervical spine CT.  Labs are most notable for potassium 6.4, bicarbonate 18, BUN 90, anion gap 16, normal WBC, and hemoglobin 9.3.   Nephrology (Dr. Norine) was consulted by the ED physician and recommended hospital admission and administration of Lokelma .  Lokelma  was given in the ED.  Assessment and Plan:  1. Hyperkalemia; ESRD  - Nephrology consulted by ED physician and Lokelma  given    - pt had missed multiple HD treatments outpaitent and was  treated with HD in hospital and potassium improved to 4.9 - pt received maintenance HD during hospitalization - discussed with Dr.Lin, nephrology--ok for d/c 8/6   2. Acute encephalopathy  - secondary to missed HD and ongoing usage of morphine  which has been discontinued - He is back to his baseline mentation after HD, discussed with nephrologist ok to discharge today - No acute findings on head CT  - Stop morphine  and do not resume, counseled with patient and he verbalized understanding   3. COPD  - Not in exacerbation   - Continue ICS-LAMA-LABA and as-needed SABA     4. Hypertension  - Coreg  --continued during hospitalization   5. Type II DM  - CBGs have been stable, resume home treatment plan -05/06/24 A1C--4.9 -previously on Lantus  back in 2022, but now diet controlled   6. Anemia of CKD  - Hgb is 9.3, down from 11.5 earlier this month  - No overt bleeding   7. Chronic pain  - He was prescribed 50 tabs of morphine  sulfate IR 30 mg on 05/27/24  - Morphine  should be avoided in ESRD patients, particularly given his recurrent presentations with encephalopathy   - Pt was counseled at length to avoid morphine  and he verbalized understanding - oxycodone  5 mg ordered PRN for severe pain only, follow up with his PCP who is prescribing his pain management.     Consultants: renal Procedures performed:  none  Disposition: Skilled nursing facility Diet recommendation:  Renal diet DISCHARGE MEDICATION: Allergies as of 06/08/2024   No Known Allergies      Medication List     STOP taking these medications    diphenhydramine -acetaminophen  25-500 MG Tabs tablet Commonly known as: TYLENOL  PM   hydrOXYzine  25 MG tablet Commonly known as: ATARAX    Lantus  100 UNIT/ML injection Generic drug: insulin  glargine   loperamide  2 MG capsule Commonly known as: IMODIUM    LORazepam 0.5 MG tablet Commonly known as: ATIVAN   morphine  30 MG tablet Commonly known as: MSIR   sodium  bicarbonate 650 MG tablet       TAKE these medications    amLODipine  10 MG tablet Commonly known as: NORVASC  Take 10 mg by mouth daily as needed (diastolic blood pressure above 90).   calcitRIOL  0.25 MCG capsule Commonly known as: ROCALTROL  Take 0.25 mcg by mouth daily.   Calcium  Acetate 667 MG Tabs Take 1 tablet by mouth with breakfast, with lunch, and with evening meal.   carvedilol  6.25 MG tablet Commonly known as: COREG  Take 1 tablet (6.25 mg total) by mouth 2 (two) times daily with a meal.   diphenoxylate -atropine  2.5-0.025 MG tablet Commonly known as: LOMOTIL  Take 1 tablet by mouth 4 (four) times daily as needed for diarrhea or loose stools. What changed:  when to take this reasons to take this   fluticasone  50 MCG/ACT nasal spray Commonly known as: FLONASE  Place 2 sprays into both nostrils daily.   ondansetron  4 MG tablet Commonly known as: ZOFRAN  Take 4 mg by mouth every 8 (eight) hours as needed for nausea or vomiting.   oxybutynin  10 MG 24 hr tablet Commonly known as: DITROPAN -XL Take 10 mg by mouth daily.   oxyCODONE  5 MG immediate release tablet Commonly known as: Oxy IR/ROXICODONE  Take 1 tablet (5 mg total) by mouth every 6 (six) hours as needed for severe pain (pain score 7-10).   senna 8.6 MG Tabs tablet Commonly known as: SENOKOT Take 1 tablet (8.6 mg total) by mouth daily as needed for mild constipation or moderate constipation.        Contact information for follow-up providers     Gerome Tillman CROME, FNP Follow up in 1 week(s).   Specialty: Family Medicine Why: Hospital Follow Up Contact information: [address] [place] [address] [phone]         Hemodialysis. Go on 06/03/2024.   Why: Continue regular outpatient schedule for dialysis and stop missing treatments             Contact information for after-discharge care     Destination     [facility] .   Service: Skilled Nursing Contact  information: [address] [place]  [address] [phone]                    Discharge Exam: Fredricka Weights   06/03/24 1610 06/06/24 1548 06/06/24 1909  Weight: 61.3 kg 63.6 kg 62.5 kg   HEENT:  [place]/AT, No thrush, no icterus CV:  RRR, no rub, no S3, no S4 Lung:  CTA, no wheeze, no rhonchi Abd:  soft/+BS, NT Ext:  No edema, no lymphangitis, no synovitis, no rash   Condition at discharge: stable  The results of significant diagnostics from this hospitalization (including imaging, microbiology, ancillary and laboratory) are listed below for reference.   Imaging Studies: DG Shoulder Right Result Date: 06/02/2024 CLINICAL DATA:  Recent fall with right shoulder pain, initial  encounter EXAM: RIGHT SHOULDER - 2+ VIEW COMPARISON:  None Available. FINDINGS: Mild degenerative changes of the acromioclavicular joint are seen. Humeral head is high-riding likely related to chronic rotator cuff injury. Some remodeling of the acromion is seen. No acute fracture or dislocation is noted. IMPRESSION: Humeral head likely related to chronic rotator cuff injury. Electronically Signed   By: Oneil Devonshire M.D.   On: 06/02/2024 03:45   CT Head Wo Contrast Result Date: 06/02/2024 CLINICAL DATA:  Fall yesterday with headaches and neck pain, initial encounter EXAM: CT HEAD WITHOUT CONTRAST CT CERVICAL SPINE WITHOUT CONTRAST TECHNIQUE: Multidetector CT imaging of the head and cervical spine was performed following the standard protocol without intravenous contrast. Multiplanar CT image reconstructions of the cervical spine were also generated. RADIATION DOSE REDUCTION: This exam was performed according to the departmental dose-optimization program which includes automated exposure control, adjustment of the mA and/or kV according to patient size and/or use of iterative reconstruction technique. COMPARISON:  None Available. FINDINGS: CT HEAD FINDINGS Brain: No evidence of acute infarction,  hemorrhage, hydrocephalus, extra-axial collection or mass lesion/mass effect. Mild chronic white matter ischemic changes are noted. Vascular: No hyperdense vessel or unexpected calcification. Skull: Normal. Negative for fracture or focal lesion. Sinuses/Orbits: No acute finding. Other: None. CT CERVICAL SPINE FINDINGS Alignment: Within normal limits. Skull base and vertebrae: 7 cervical segments are well visualized. Prior fusion at C4-5 is noted with anterior fixation. Posterior fixation extending from C4 to T1 is noted bilaterally. The odontoid is within normal limits. No acute fracture or acute facet abnormality is noted. Multilevel osteophytic change and facet hypertrophic changes are noted. Soft tissues and spinal canal: Surrounding soft tissue structures are within normal limits. Upper chest: Visualized lung apices are within normal limits. Other: None IMPRESSION: CT of the head: Chronic white matter ischemic changes. No acute abnormality noted. CT of cervical spine: Changes of prior cervical fusion are seen. Mild degenerative changes are noted. No acute abnormality noted. Electronically Signed   By: Oneil Devonshire M.D.   On: 06/02/2024 03:36   CT Cervical Spine Wo Contrast Result Date: 06/02/2024 CLINICAL DATA:  Fall yesterday with headaches and neck pain, initial encounter EXAM: CT HEAD WITHOUT CONTRAST CT CERVICAL SPINE WITHOUT CONTRAST TECHNIQUE: Multidetector CT imaging of the head and cervical spine was performed following the standard protocol without intravenous contrast. Multiplanar CT image reconstructions of the cervical spine were also generated. RADIATION DOSE REDUCTION: This exam was performed according to the departmental dose-optimization program which includes automated exposure control, adjustment of the mA and/or kV according to patient size and/or use of iterative reconstruction technique. COMPARISON:  None Available. FINDINGS: CT HEAD FINDINGS Brain: No evidence of acute infarction,  hemorrhage, hydrocephalus, extra-axial collection or mass lesion/mass effect. Mild chronic white matter ischemic changes are noted. Vascular: No hyperdense vessel or unexpected calcification. Skull: Normal. Negative for fracture or focal lesion. Sinuses/Orbits: No acute finding. Other: None. CT CERVICAL SPINE FINDINGS Alignment: Within normal limits. Skull base and vertebrae: 7 cervical segments are well visualized. Prior fusion at C4-5 is noted with anterior fixation. Posterior fixation extending from C4 to T1 is noted bilaterally. The odontoid is within normal limits. No acute fracture or acute facet abnormality is noted. Multilevel osteophytic change and facet hypertrophic changes are noted. Soft tissues and spinal canal: Surrounding soft tissue structures are within normal limits. Upper chest: Visualized lung apices are within normal limits. Other: None IMPRESSION: CT of the head: Chronic white matter ischemic changes. No acute abnormality noted. CT of cervical  spine: Changes of prior cervical fusion are seen. Mild degenerative changes are noted. No acute abnormality noted. Electronically Signed   By: Oneil Devonshire M.D.   On: 06/02/2024 03:36   EEG adult Result Date: 05/09/2024 Shelton Arlin KIDD, MD     05/09/2024  5:04 PM Patient Name: Curtis Hartman MRN: [id number] Epilepsy Attending: Arlin KIDD Shelton Referring Physician/Provider: Shelton Arlin KIDD, MD Date: 05/09/2024 Duration: 22.06 mins Patient history: 63 y.o. 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 EEG to evaluate for seizure Level of alertness: Awake, asleep AEDs during EEG study: None Technical aspects: This EEG study was done with scalp electrodes positioned according to the 10-20 International system of electrode placement. Electrical activity was reviewed with band pass filter of 1-70Hz , sensitivity of 7 uV/mm, display speed of  47mm/sec with a 60Hz  notched filter applied as appropriate. EEG data were recorded continuously and digitally stored.  Video monitoring was available and reviewed as appropriate. Description: The posterior dominant rhythm consists of 7.5 Hz activity of moderate voltage (25-35 uV) seen predominantly in posterior head regions, symmetric and reactive to eye opening and eye closing. Sleep was characterized by vertex waves, sleep spindles (12 to 14 Hz), maximal frontocentral region. EEG showed intermittent generalized  3 to 6 Hz theta-delta slowing. Hyperventilation and photic stimulation were not performed.   ABNORMALITY - Intermittent slow, generalized IMPRESSION: This study is suggestive of mild diffuse encephalopathy. No seizures or epileptiform discharges were seen throughout the recording. Arlin KIDD Shelton    Microbiology: Results for orders placed or performed during the hospital encounter of 06/02/24  MRSA Next Gen by PCR, Nasal     Status: None   Collection Time: 06/02/24  9:40 AM   Specimen: Nasal Mucosa; Nasal Swab  Result Value Ref Range Status   MRSA by PCR Next Gen NOT DETECTED NOT DETECTED Final    Comment: (NOTE) The GeneXpert MRSA Assay (FDA approved for NASAL specimens only), is one component of a comprehensive MRSA colonization surveillance program. It is not intended to diagnose MRSA infection nor to guide or monitor treatment for MRSA infections. Test performance is not FDA approved in patients less than 60 years old. Performed at [facility], [address] [place], [place] [address]     Labs: CBC: Recent Labs  Lab 06/02/24 0251 06/03/24 0418 06/06/24 0314  WBC 9.2 8.5 6.9  NEUTROABS 6.8  --   --   HGB 9.3* 11.0* 10.1*  HCT 29.3* 34.6* 31.8*  MCV 94.8 93.8 93.8  PLT 278 313 321   Basic Metabolic Panel: Recent Labs  Lab 06/02/24 0251 06/02/24 0742 06/03/24 0418 06/06/24 0314 06/07/24 0502 06/08/24 0446  NA 142  --  138 137 136 135  K 6.4* 6.3* 4.9 5.4* 4.5  4.5  CL 108  --  97* 103 97* 97*  CO2 18*  --  26 20* 26 22  GLUCOSE 89  --  105* 147* 148* 110*  BUN 90*  --  56* 88* 53* 68*  CREATININE 10.13*  --  6.82* 8.72* 5.51* 6.50*  CALCIUM  9.0  --  8.9 8.6* 8.7* 9.0  PHOS  --   --  7.9* 9.1* 5.7* 6.3*   Liver Function Tests: Recent Labs  Lab 06/02/24 0251 06/03/24 0418 06/06/24 0314 06/07/24 0502 06/08/24 0446  AST 12*  --   --   --   --   ALT 7  --   --   --   --  ALKPHOS 53  --   --   --   --   BILITOT 1.1  --   --   --   --   PROT 6.5  --   --   --   --   ALBUMIN  3.0* 2.9* 2.9* 2.7* 2.9*   CBG: Recent Labs  Lab 06/07/24 1106 06/07/24 1608 06/07/24 2040 06/08/24 0258 06/08/24 0713  GLUCAP 164* 157* 151* 149* 110*    Discharge time spent: greater than 30 minutes.  Signed: Alm Schneider, MD [facility] 06/08/2024

## 2024-06-08 NOTE — Plan of Care (Signed)

## 2024-06-08 NOTE — Progress Notes (Signed)
 [facility] NEPHROLOGY PROGRESS NOTE  Assessment/ Plan: Pt is a 63 y.o. yo male  with past medical history significant for hypertension, type 2 diabetes, dyslipidemia, stroke, ESRD on HD who was presented after the fall associated with lethargy and generalized weakness, seen as a consultation for the management of ESRD.   Outpatient Dialysis Orders from recent hospitalization:  [facility], MWF, Elisio 17H Time 4:00, edw 62.5kg, LUE AVF BFR 350, DFR 600, 2K/2.5Ca bath Heparin  1000 units bolus then 1000 units/hr   # ESRD MWF at [facility]: Continue HD per MWF schedule Orders for dialysis in place; been on dialysis for about 4 months stating that he just does not feel well with dialysis.   # Hypertension/volume: BP acceptable.  Continue home antihypertensives and UF with HD.   # Anemia of ESRD: Hemoglobin at goal.  CTM. No esa   # CKD-metabolic Bone Disease/hyperphosphatemia: Phos high. Ca corrects to normal. Continue home by nursing calcitriol    #Weakness: awaiting SNF  Ok to discharge from renal perspective.  Awaiting SNF  Subjective: patient stated he does not feel well on dialysis with nausea, anxiety.     Likely getting a benzodiazepine from dialysis given anxiety.     Objective Vital signs in last 24 hours: Vitals:   06/07/24 1938 06/08/24 0256 06/08/24 0702 06/08/24 0816  BP: 137/74 (!) 187/72  (!) 156/93  Pulse: 73 71  75  Resp: 16 16  18   Temp: 98.4 F (36.9 C) 97.9 F (36.6 C)    TempSrc: Oral Oral    SpO2: 98% 98% 98%   Weight:      Height:       Weight change:   Intake/Output Summary (Last 24 hours) at 06/08/2024 1009 Last data filed at 06/07/2024 1700 Gross per 24 hour  Intake 600 ml  Output --  Net 600 ml       Labs: RENAL PANEL Recent Labs  Lab 06/02/24 0251 06/02/24 0742 06/03/24 0418 06/06/24 0314 06/07/24 0502 06/08/24 0446  NA 142  --  138 137 136 135  K 6.4* 6.3* 4.9 5.4* 4.5 4.5  CL 108  --  97* 103 97* 97*  CO2 18*  --   26 20* 26 22  GLUCOSE 89  --  105* 147* 148* 110*  BUN 90*  --  56* 88* 53* 68*  CREATININE 10.13*  --  6.82* 8.72* 5.51* 6.50*  CALCIUM  9.0  --  8.9 8.6* 8.7* 9.0  PHOS  --   --  7.9* 9.1* 5.7* 6.3*  ALBUMIN  3.0*  --  2.9* 2.9* 2.7* 2.9*    Liver Function Tests: Recent Labs  Lab 06/02/24 0251 06/03/24 0418 06/06/24 0314 06/07/24 0502 06/08/24 0446  AST 12*  --   --   --   --   ALT 7  --   --   --   --   ALKPHOS 53  --   --   --   --   BILITOT 1.1  --   --   --   --   PROT 6.5  --   --   --   --   ALBUMIN  3.0*   < > 2.9* 2.7* 2.9*   < > = values in this interval not displayed.   No results for input(s): LIPASE, AMYLASE in the last 168 hours. No results for input(s): AMMONIA in the last 168 hours. CBC: Recent Labs    09/10/23 0749 09/10/23 1057 05/06/24 1211 05/07/24 0301 05/08/24 0330 05/09/24  9495 06/02/24 0251 06/03/24 0418 06/06/24 0314  HGB 7.0*   < >  --    < > 10.9* 11.5* 9.3* 11.0* 10.1*  MCV 90.9   < >  --    < > 92.6 90.7 94.8 93.8 93.8  VITAMINB12  --   --  361  --   --   --   --   --   --   FOLATE  --   --  6.3  --   --   --   --   --   --   TIBC 195*  --   --   --   --   --   --   --   --   IRON  45  --   --   --   --   --   --   --   --    < > = values in this interval not displayed.    Cardiac Enzymes: Recent Labs  Lab 06/02/24 0251  CKTOTAL 197   CBG: Recent Labs  Lab 06/07/24 1106 06/07/24 1608 06/07/24 2040 06/08/24 0258 06/08/24 0713  GLUCAP 164* 157* 151* 149* 110*    Iron  Studies: No results for input(s): IRON , TIBC, TRANSFERRIN, FERRITIN in the last 72 hours. Studies/Results: No results found.   Medications: Infusions:   Scheduled Medications:  aspirin   81 mg Oral Daily   budesonide -glycopyrrolate -formoterol   2 puff Inhalation BID   calcitRIOL   0.25 mcg Oral Daily   calcium  acetate  667 mg Oral TID with meals   carvedilol   6.25 mg Oral BID WC   Chlorhexidine  Gluconate Cloth  6 each Topical Q0600    heparin   5,000 Units Subcutaneous Q8H   insulin  aspart  0-6 Units Subcutaneous TID WC   neomycin -bacitracin -polymyxin   Topical TID   sodium chloride  flush  3 mL Intravenous Q12H    have reviewed scheduled and prn medications.  Physical Exam: General:NAD, comfortable Heart:normal rate, no rub Lungs:bilateral chest rise with no iwob Abdomen:soft, Non-tender, non-distended Extremities:No edema Dialysis Access: Lt BCF  Curtis Hartman W 06/08/2024,10:09 AM  LOS: 6 days

## 2024-06-24 ENCOUNTER — Encounter: Payer: Self-pay | Admitting: Radiology

## 2024-07-06 ENCOUNTER — Other Ambulatory Visit: Payer: Self-pay

## 2024-07-06 ENCOUNTER — Emergency Department (HOSPITAL_COMMUNITY)

## 2024-07-06 ENCOUNTER — Encounter (HOSPITAL_COMMUNITY): Payer: Self-pay | Admitting: *Deleted

## 2024-07-06 ENCOUNTER — Observation Stay (HOSPITAL_COMMUNITY)
Admission: EM | Admit: 2024-07-06 | Discharge: 2024-07-08 | DRG: 917 | Disposition: A | Discharge status: Home / Self Care | Attending: Family Medicine | Admitting: Family Medicine

## 2024-07-06 DIAGNOSIS — M898X9 Other specified disorders of bone, unspecified site: Secondary | ICD-10-CM | POA: Insufficient documentation

## 2024-07-06 DIAGNOSIS — E1122 Type 2 diabetes mellitus with diabetic chronic kidney disease: Secondary | ICD-10-CM | POA: Diagnosis present

## 2024-07-06 DIAGNOSIS — N186 End stage renal disease: Secondary | ICD-10-CM | POA: Diagnosis present

## 2024-07-06 DIAGNOSIS — Y92009 Unspecified place in unspecified non-institutional (private) residence as the place of occurrence of the external cause: Secondary | ICD-10-CM

## 2024-07-06 DIAGNOSIS — J449 Chronic obstructive pulmonary disease, unspecified: Secondary | ICD-10-CM | POA: Diagnosis present

## 2024-07-06 DIAGNOSIS — Z820 Family history of epilepsy and other diseases of the nervous system: Secondary | ICD-10-CM

## 2024-07-06 DIAGNOSIS — Z8673 Personal history of transient ischemic attack (TIA), and cerebral infarction without residual deficits: Secondary | ICD-10-CM

## 2024-07-06 DIAGNOSIS — E875 Hyperkalemia: Secondary | ICD-10-CM | POA: Diagnosis present

## 2024-07-06 DIAGNOSIS — Z8349 Family history of other endocrine, nutritional and metabolic diseases: Secondary | ICD-10-CM

## 2024-07-06 DIAGNOSIS — T43211A Poisoning by selective serotonin and norepinephrine reuptake inhibitors, accidental (unintentional), initial encounter: Principal | ICD-10-CM | POA: Insufficient documentation

## 2024-07-06 DIAGNOSIS — D631 Anemia in chronic kidney disease: Secondary | ICD-10-CM | POA: Diagnosis present

## 2024-07-06 DIAGNOSIS — G9349 Other encephalopathy: Secondary | ICD-10-CM | POA: Diagnosis present

## 2024-07-06 DIAGNOSIS — I120 Hypertensive chronic kidney disease with stage 5 chronic kidney disease or end stage renal disease: Principal | ICD-10-CM | POA: Diagnosis present

## 2024-07-06 DIAGNOSIS — E119 Type 2 diabetes mellitus without complications: Secondary | ICD-10-CM

## 2024-07-06 DIAGNOSIS — E1140 Type 2 diabetes mellitus with diabetic neuropathy, unspecified: Secondary | ICD-10-CM | POA: Insufficient documentation

## 2024-07-06 DIAGNOSIS — R5381 Other malaise: Secondary | ICD-10-CM | POA: Insufficient documentation

## 2024-07-06 DIAGNOSIS — Z91A48 Caregiver's other noncompliance with patient's medication regimen for other reason: Secondary | ICD-10-CM

## 2024-07-06 DIAGNOSIS — I160 Hypertensive urgency: Principal | ICD-10-CM | POA: Diagnosis present

## 2024-07-06 DIAGNOSIS — Z8249 Family history of ischemic heart disease and other diseases of the circulatory system: Secondary | ICD-10-CM

## 2024-07-06 DIAGNOSIS — Z992 Dependence on renal dialysis: Secondary | ICD-10-CM

## 2024-07-06 DIAGNOSIS — G8929 Other chronic pain: Secondary | ICD-10-CM | POA: Diagnosis present

## 2024-07-06 DIAGNOSIS — Z781 Physical restraint status: Secondary | ICD-10-CM

## 2024-07-06 DIAGNOSIS — Z79899 Other long term (current) drug therapy: Secondary | ICD-10-CM

## 2024-07-06 DIAGNOSIS — Z91158 Patient's noncompliance with renal dialysis for other reason: Secondary | ICD-10-CM

## 2024-07-06 DIAGNOSIS — I10 Essential (primary) hypertension: Secondary | ICD-10-CM | POA: Diagnosis present

## 2024-07-06 DIAGNOSIS — E785 Hyperlipidemia, unspecified: Secondary | ICD-10-CM | POA: Diagnosis present

## 2024-07-06 DIAGNOSIS — Z823 Family history of stroke: Secondary | ICD-10-CM

## 2024-07-06 DIAGNOSIS — R296 Repeated falls: Secondary | ICD-10-CM | POA: Diagnosis present

## 2024-07-06 DIAGNOSIS — G9341 Metabolic encephalopathy: Principal | ICD-10-CM | POA: Insufficient documentation

## 2024-07-06 DIAGNOSIS — W19XXXA Unspecified fall, initial encounter: Secondary | ICD-10-CM | POA: Diagnosis present

## 2024-07-06 DIAGNOSIS — R531 Weakness: Secondary | ICD-10-CM | POA: Diagnosis present

## 2024-07-06 DIAGNOSIS — F1120 Opioid dependence, uncomplicated: Secondary | ICD-10-CM | POA: Diagnosis present

## 2024-07-06 DIAGNOSIS — Z7982 Long term (current) use of aspirin: Secondary | ICD-10-CM

## 2024-07-06 DIAGNOSIS — G9340 Encephalopathy, unspecified: Secondary | ICD-10-CM | POA: Diagnosis present

## 2024-07-06 DIAGNOSIS — F17210 Nicotine dependence, cigarettes, uncomplicated: Secondary | ICD-10-CM | POA: Diagnosis present

## 2024-07-06 DIAGNOSIS — G928 Other toxic encephalopathy: Secondary | ICD-10-CM | POA: Insufficient documentation

## 2024-07-06 DIAGNOSIS — N2581 Secondary hyperparathyroidism of renal origin: Secondary | ICD-10-CM | POA: Diagnosis present

## 2024-07-06 LAB — CBC WITH DIFFERENTIAL/PLATELET
Abs Immature Granulocytes: 0.02 K/uL (ref 0.00–0.07)
Basophils Absolute: 0.1 K/uL (ref 0.0–0.1)
Basophils Relative: 1 %
Eosinophils Absolute: 0.1 K/uL (ref 0.0–0.5)
Eosinophils Relative: 1 %
HCT: 34.5 % — ABNORMAL LOW (ref 39.0–52.0)
Hemoglobin: 11.2 g/dL — ABNORMAL LOW (ref 13.0–17.0)
Immature Granulocytes: 0 %
Lymphocytes Relative: 12 %
Lymphs Abs: 1 K/uL (ref 0.7–4.0)
MCH: 29.3 pg (ref 26.0–34.0)
MCHC: 32.5 g/dL (ref 30.0–36.0)
MCV: 90.3 fL (ref 80.0–100.0)
Monocytes Absolute: 0.4 K/uL (ref 0.1–1.0)
Monocytes Relative: 5 %
Neutro Abs: 6.7 K/uL (ref 1.7–7.7)
Neutrophils Relative %: 81 %
Platelets: 247 K/uL (ref 150–400)
RBC: 3.82 MIL/uL — ABNORMAL LOW (ref 4.22–5.81)
RDW: 13.9 % (ref 11.5–15.5)
WBC: 8.2 K/uL (ref 4.0–10.5)
nRBC: 0 % (ref 0.0–0.2)

## 2024-07-06 LAB — GLUCOSE, CAPILLARY: Glucose-Capillary: 110 mg/dL — ABNORMAL HIGH (ref 70–99)

## 2024-07-06 LAB — MAGNESIUM: Magnesium: 1.9 mg/dL (ref 1.7–2.4)

## 2024-07-06 LAB — COMPREHENSIVE METABOLIC PANEL WITH GFR
ALT: 7 U/L (ref 0–44)
AST: 19 U/L (ref 15–41)
Albumin: 3.3 g/dL — ABNORMAL LOW (ref 3.5–5.0)
Alkaline Phosphatase: 66 U/L (ref 38–126)
Anion gap: 16 — ABNORMAL HIGH (ref 5–15)
BUN: 57 mg/dL — ABNORMAL HIGH (ref 8–23)
CO2: 21 mmol/L — ABNORMAL LOW (ref 22–32)
Calcium: 9.2 mg/dL (ref 8.9–10.3)
Chloride: 97 mmol/L — ABNORMAL LOW (ref 98–111)
Creatinine, Ser: 6.96 mg/dL — ABNORMAL HIGH (ref 0.61–1.24)
GFR, Estimated: 8 mL/min — ABNORMAL LOW (ref >60)
Glucose, Bld: 121 mg/dL — ABNORMAL HIGH (ref 70–99)
Potassium: 5.5 mmol/L — ABNORMAL HIGH (ref 3.5–5.1)
Sodium: 134 mmol/L — ABNORMAL LOW (ref 135–145)
Total Bilirubin: 1.1 mg/dL (ref 0.0–1.2)
Total Protein: 6.7 g/dL (ref 6.5–8.1)

## 2024-07-06 LAB — MRSA NEXT GEN BY PCR, NASAL: MRSA by PCR Next Gen: DETECTED — AB

## 2024-07-06 MED ORDER — MUPIROCIN 2 % EX OINT
1.0000 | TOPICAL_OINTMENT | Freq: Two times a day (BID) | CUTANEOUS | Status: DC
  Administered 2024-07-06 – 2024-07-08 (×4): 1 via NASAL
  Filled 2024-07-06 (×2): qty 22

## 2024-07-06 MED ORDER — BISACODYL 10 MG RE SUPP: 10.0000 mg | Freq: Every day | RECTAL | Status: DC | PRN

## 2024-07-06 MED ORDER — HYDRALAZINE HCL 20 MG/ML IJ SOLN
20.0000 mg | INTRAMUSCULAR | Status: DC | PRN
  Administered 2024-07-06 – 2024-07-07 (×2): 20 mg via INTRAVENOUS
  Filled 2024-07-06 (×2): qty 1

## 2024-07-06 MED ORDER — SODIUM ZIRCONIUM CYCLOSILICATE 10 G PO PACK
10.0000 g | PACK | Freq: Three times a day (TID) | ORAL | Status: DC
  Administered 2024-07-06 – 2024-07-08 (×4): 10 g via ORAL
  Filled 2024-07-06 (×5): qty 1

## 2024-07-06 MED ORDER — ORAL CARE MOUTH RINSE: 15.0000 mL | OROMUCOSAL | Status: DC | PRN

## 2024-07-06 MED ORDER — SODIUM CHLORIDE 0.9 % IV SOLN: INTRAVENOUS | Status: AC | PRN

## 2024-07-06 MED ORDER — AMLODIPINE BESYLATE 5 MG PO TABS
10.0000 mg | ORAL_TABLET | Freq: Once | ORAL | Status: AC
  Administered 2024-07-06: 10 mg via ORAL
  Filled 2024-07-06: qty 2

## 2024-07-06 MED ORDER — HALOPERIDOL LACTATE 5 MG/ML IJ SOLN
10.0000 mg | Freq: Once | INTRAMUSCULAR | Status: AC
  Administered 2024-07-06: 10 mg via INTRAMUSCULAR
  Filled 2024-07-06: qty 2

## 2024-07-06 MED ORDER — CALCIUM GLUCONATE-NACL 1-0.675 GM/50ML-% IV SOLN
1.0000 g | Freq: Once | INTRAVENOUS | Status: DC
  Filled 2024-07-06: qty 50

## 2024-07-06 MED ORDER — ONDANSETRON HCL 4 MG/2ML IJ SOLN
4.0000 mg | Freq: Four times a day (QID) | INTRAMUSCULAR | Status: DC | PRN
Start: 2024-07-06 — End: 2024-07-08

## 2024-07-06 MED ORDER — SODIUM CHLORIDE 0.9% FLUSH
3.0000 mL | Freq: Two times a day (BID) | INTRAVENOUS | Status: DC
  Administered 2024-07-07 – 2024-07-08 (×2): 3 mL via INTRAVENOUS

## 2024-07-06 MED ORDER — OXYCODONE HCL 5 MG PO TABS
5.0000 mg | ORAL_TABLET | Freq: Four times a day (QID) | ORAL | Status: DC | PRN
  Administered 2024-07-06 – 2024-07-08 (×3): 5 mg via ORAL
  Filled 2024-07-06 (×3): qty 1

## 2024-07-06 MED ORDER — DIPHENOXYLATE-ATROPINE 2.5-0.025 MG PO TABS: 1.0000 | ORAL_TABLET | Freq: Four times a day (QID) | ORAL | Status: DC | PRN

## 2024-07-06 MED ORDER — LORAZEPAM 2 MG/ML IJ SOLN
0.5000 mg | Freq: Once | INTRAMUSCULAR | Status: AC
  Administered 2024-07-06: 0.5 mg via INTRAVENOUS
  Filled 2024-07-06: qty 1

## 2024-07-06 MED ORDER — ONDANSETRON HCL 4 MG PO TABS: 4.0000 mg | ORAL_TABLET | Freq: Four times a day (QID) | ORAL | Status: DC | PRN

## 2024-07-06 MED ORDER — TRAZODONE HCL 50 MG PO TABS
200.0000 mg | ORAL_TABLET | Freq: Every evening | ORAL | Status: DC | PRN
  Administered 2024-07-06: 200 mg via ORAL
  Filled 2024-07-06: qty 4

## 2024-07-06 MED ORDER — OXYBUTYNIN CHLORIDE ER 5 MG PO TB24
10.0000 mg | ORAL_TABLET | Freq: Every day | ORAL | Status: DC
Start: 2024-07-07 — End: 2024-07-08
  Administered 2024-07-07 – 2024-07-08 (×2): 10 mg via ORAL
  Filled 2024-07-06 (×2): qty 2

## 2024-07-06 MED ORDER — SODIUM CHLORIDE 0.9% FLUSH: 3.0000 mL | INTRAVENOUS | Status: DC | PRN

## 2024-07-06 MED ORDER — SODIUM ZIRCONIUM CYCLOSILICATE 5 G PO PACK
10.0000 g | PACK | Freq: Once | ORAL | Status: DC
  Filled 2024-07-06: qty 2

## 2024-07-06 MED ORDER — CALCIUM ACETATE (PHOS BINDER) 667 MG PO CAPS
667.0000 mg | ORAL_CAPSULE | Freq: Three times a day (TID) | ORAL | Status: DC
  Administered 2024-07-07 – 2024-07-08 (×4): 667 mg via ORAL
  Filled 2024-07-06 (×4): qty 1

## 2024-07-06 MED ORDER — CARVEDILOL 12.5 MG PO TABS
12.5000 mg | ORAL_TABLET | Freq: Once | ORAL | Status: AC
  Administered 2024-07-06: 12.5 mg via ORAL
  Filled 2024-07-06: qty 1

## 2024-07-06 MED ORDER — ACETAMINOPHEN 650 MG RE SUPP: 650.0000 mg | Freq: Four times a day (QID) | RECTAL | Status: DC | PRN

## 2024-07-06 MED ORDER — ALBUTEROL SULFATE (2.5 MG/3ML) 0.083% IN NEBU
10.0000 mg | INHALATION_SOLUTION | Freq: Once | RESPIRATORY_TRACT | Status: AC
  Administered 2024-07-06: 10 mg via RESPIRATORY_TRACT
  Filled 2024-07-06: qty 12

## 2024-07-06 MED ORDER — CHLORHEXIDINE GLUCONATE CLOTH 2 % EX PADS
6.0000 | MEDICATED_PAD | Freq: Every day | CUTANEOUS | Status: DC
  Administered 2024-07-07 – 2024-07-08 (×2): 6 via TOPICAL

## 2024-07-06 MED ORDER — FLUTICASONE PROPIONATE 50 MCG/ACT NA SUSP
2.0000 | Freq: Every day | NASAL | Status: DC
  Administered 2024-07-08: 2 via NASAL
  Filled 2024-07-06: qty 16

## 2024-07-06 MED ORDER — INSULIN ASPART 100 UNIT/ML IJ SOLN: 0.0000 [IU] | Freq: Every day | INTRAMUSCULAR | Status: DC

## 2024-07-06 MED ORDER — AMLODIPINE BESYLATE 5 MG PO TABS
10.0000 mg | ORAL_TABLET | Freq: Every day | ORAL | Status: DC
  Administered 2024-07-07 – 2024-07-08 (×2): 10 mg via ORAL
  Filled 2024-07-06 (×2): qty 2

## 2024-07-06 MED ORDER — CARVEDILOL 3.125 MG PO TABS
6.2500 mg | ORAL_TABLET | Freq: Two times a day (BID) | ORAL | Status: DC
  Filled 2024-07-06: qty 2

## 2024-07-06 MED ORDER — SODIUM BICARBONATE 8.4 % IV SOLN
50.0000 meq | Freq: Once | INTRAVENOUS | Status: DC
Start: 2024-07-06 — End: 2024-07-08
  Filled 2024-07-06: qty 50

## 2024-07-06 MED ORDER — INSULIN ASPART 100 UNIT/ML IJ SOLN
0.0000 [IU] | Freq: Three times a day (TID) | INTRAMUSCULAR | Status: DC
  Administered 2024-07-07: 1 [IU] via SUBCUTANEOUS

## 2024-07-06 MED ORDER — CALCITRIOL 0.25 MCG PO CAPS
0.2500 ug | ORAL_CAPSULE | Freq: Every day | ORAL | Status: DC
  Administered 2024-07-07 – 2024-07-08 (×2): 0.25 ug via ORAL
  Filled 2024-07-06 (×2): qty 1

## 2024-07-06 MED ORDER — SODIUM CHLORIDE 0.9% FLUSH
3.0000 mL | Freq: Two times a day (BID) | INTRAVENOUS | Status: DC
  Administered 2024-07-06 – 2024-07-08 (×3): 3 mL via INTRAVENOUS

## 2024-07-06 MED ORDER — POLYETHYLENE GLYCOL 3350 17 G PO PACK
17.0000 g | PACK | Freq: Every day | ORAL | Status: DC | PRN
Start: 2024-07-06 — End: 2024-07-08

## 2024-07-06 MED ORDER — METHOCARBAMOL 500 MG PO TABS
750.0000 mg | ORAL_TABLET | Freq: Three times a day (TID) | ORAL | Status: DC
  Administered 2024-07-06 – 2024-07-08 (×6): 750 mg via ORAL
  Filled 2024-07-06 (×6): qty 2

## 2024-07-06 MED ORDER — ACETAMINOPHEN 325 MG PO TABS: 650.0000 mg | ORAL_TABLET | Freq: Four times a day (QID) | ORAL | Status: DC | PRN

## 2024-07-06 MED ORDER — LABETALOL HCL 5 MG/ML IV SOLN
20.0000 mg | INTRAVENOUS | Status: DC | PRN
  Filled 2024-07-06: qty 4

## 2024-07-06 MED ORDER — HEPARIN SODIUM (PORCINE) 5000 UNIT/ML IJ SOLN
5000.0000 [IU] | Freq: Three times a day (TID) | INTRAMUSCULAR | Status: DC
  Administered 2024-07-06 – 2024-07-08 (×6): 5000 [IU] via SUBCUTANEOUS
  Filled 2024-07-06 (×6): qty 1

## 2024-07-06 NOTE — ED Provider Notes (Signed)
 [place] EMERGENCY DEPARTMENT AT [facility] Provider Note   CSN: [id number] Arrival date & time: 07/06/24  1158     Patient presents with: Hypertension   Curtis Hartman is a 63 y.o. male.   HPI Patient arrives via EMS with concern for falls, weakness.  I discussed his arrival as the patient rolled into the facility with EMS. Patient describes weakness, head pain, chest tightness. Patient's history is notable for stage renal disease, and he missed his last session, was scheduled today and thus had his last dialysis session 5 days ago. EMS reports patient was hypertensive, but awake and alert in transport.  Reportedly the patient's sister called due to patient having frequent falls over the past few days not taking his medication.    Prior to Admission medications   Medication Sig Start Date End Date Taking? Authorizing Provider  methocarbamol  (ROBAXIN ) 750 MG tablet Take 750 mg by mouth 3 (three) times daily. 06/29/24  Yes [provider]  amLODipine  (NORVASC ) 10 MG tablet Take 10 mg by mouth daily as needed (diastolic blood pressure above 90).    [provider]  calcitRIOL  (ROCALTROL ) 0.25 MCG capsule Take 0.25 mcg by mouth daily.    [provider]  Calcium  Acetate 667 MG TABS Take 1 tablet by mouth with breakfast, with lunch, and with evening meal.    [provider]  carvedilol  (COREG ) 6.25 MG tablet Take 1 tablet (6.25 mg total) by mouth 2 (two) times daily with a meal. 05/11/24   Tat, Alm, MD  diphenoxylate -atropine  (LOMOTIL ) 2.5-0.025 MG tablet Take 1 tablet by mouth 4 (four) times daily as needed for diarrhea or loose stools. 06/03/24   Johnson, Clanford L, MD  fluticasone  (FLONASE ) 50 MCG/ACT nasal spray Place 2 sprays into both nostrils daily. 09/24/23   Setzer, Sandra J, PA-C  LOKELMA  10 g PACK packet Take 10 g by mouth 3 (three) times daily. 06/21/24   [provider]  ondansetron  (ZOFRAN ) 4 MG tablet Take 4 mg by mouth  every 8 (eight) hours as needed for nausea or vomiting.    [provider]  ondansetron  (ZOFRAN -ODT) 4 MG disintegrating tablet Take 4 mg by mouth every 8 (eight) hours as needed for nausea or vomiting. 07/02/24   [provider]  oxybutynin  (DITROPAN -XL) 10 MG 24 hr tablet Take 10 mg by mouth daily. 02/23/24   [provider]  oxyCODONE  (OXY IR/ROXICODONE ) 5 MG immediate release tablet Take 1 tablet (5 mg total) by mouth every 6 (six) hours as needed for severe pain (pain score 7-10). 06/08/24   Tat, Alm, MD  senna (SENOKOT) 8.6 MG TABS tablet Take 1 tablet (8.6 mg total) by mouth daily as needed for mild constipation or moderate constipation. 06/03/24   Johnson, Clanford L, MD  traZODone  (DESYREL ) 100 MG tablet Take 200 mg by mouth at bedtime as needed. 07/02/24   [provider]    Allergies: Patient has no known allergies.    Review of Systems  Updated Vital Signs BP (!) 227/108   Pulse 84   Resp 16   Ht 1.715 m (5' 7.5)   Wt 62.5 kg   SpO2 100%   BMI 21.26 kg/m   Physical Exam Vitals and nursing note reviewed.  Constitutional:      General: He is not in acute distress.    Appearance: He is well-developed. He is ill-appearing.  HENT:     Head: Normocephalic and atraumatic.  Eyes:     Conjunctiva/sclera:  Conjunctivae normal.  Cardiovascular:     Rate and Rhythm: Normal rate and regular rhythm.  Pulmonary:     Effort: Pulmonary effort is normal. No respiratory distress.     Breath sounds: No stridor.  Abdominal:     General: There is no distension.  Skin:    General: Skin is warm and dry.  Neurological:     Mental Status: He is alert and oriented to person, place, and time.     Comments: Atrophy advanced for age otherwise unremarkable     (all labs ordered are listed, but only abnormal results are displayed) Labs Reviewed  COMPREHENSIVE METABOLIC PANEL WITH GFR - Abnormal; Notable for the following components:      Result Value    Sodium 134 (*)    Potassium 5.5 (*)    Chloride 97 (*)    CO2 21 (*)    Glucose, Bld 121 (*)    BUN 57 (*)    Creatinine, Ser 6.96 (*)    Albumin  3.3 (*)    GFR, Estimated 8 (*)    Anion gap 16 (*)    All other components within normal limits  CBC WITH DIFFERENTIAL/PLATELET - Abnormal; Notable for the following components:   RBC 3.82 (*)    Hemoglobin 11.2 (*)    HCT 34.5 (*)    All other components within normal limits  MAGNESIUM   HEPATITIS B SURFACE ANTIGEN  HEPATITIS B SURFACE ANTIBODY, QUANTITATIVE  RAPID URINE DRUG SCREEN, HOSP PERFORMED  DRUG SCREEN 10 W/CONF, SERUM    EKG: EKG Interpretation Date/Time:  Wednesday July 06 2024 12:12:28 EDT Ventricular Rate:  79 PR Interval:  207 QRS Duration:  96 QT Interval:  404 QTC Calculation: 464 R Axis:   -52  Text Interpretation: Sinus rhythm LAD, consider left anterior fascicular block Anterior infarct, old Confirmed by Garrick Charleston [phone]) on 07/06/2024 3:17:12 PM  Radiology: CT Cervical Spine Wo Contrast Result Date: 07/06/2024 EXAM: CT HEAD AND CERVICAL SPINE 07/06/2024 12:37:54 PM TECHNIQUE: CT of the head and cervical spine was performed without the administration of intravenous contrast. Multiplanar reformatted images are provided for review. Automated exposure control, iterative reconstruction, and/or weight based adjustment of the mA/kV was utilized to reduce the radiation dose to as low as reasonably achievable. COMPARISON: None available. CLINICAL HISTORY: Polytrauma, blunt. Pt BIB [facility] for hypertension and reported pt missed Monday's and today's hemodialysis. Reported that pt's sister stated he was hard to arouse this morning and did not go to dialysis. Pt states he did not know why he didn't go Monday. Reported that pt fell x 2 last night. Pt points to his left chest when asked about pain. EMS reported BP 230/110 and CBG 141. FINDINGS: CT HEAD BRAIN AND VENTRICLES: No acute intracranial hemorrhage. No mass effect  or midline shift. No abnormal extra-axial fluid collection. No evidence of acute infarct. No hydrocephalus. Nonspecific hypoattenuation in the periventricular and subcortical white matter, most likely representing chronic small vessel disease. Mild parenchymal volume loss. ORBITS: No acute abnormality. SINUSES AND MASTOIDS: No acute abnormality. SOFT TISSUES AND SKULL: No acute skull fracture. No acute soft tissue abnormality. CT CERVICAL SPINE BONES AND ALIGNMENT: No acute fracture or traumatic malalignment. Straightening of the normal cervical lordosis. DEGENERATIVE CHANGES: No high grade osseous spinal canal stenosis. Facet arthrosis and uncovertebral hypertrophy at multiple levels. Anterior cervical fusion hardware and interbody spacer at C4-5. Sequelae of laminectomy at C5 to C7 with posterior instrumented fusion spanning C4 to T1. Hardware is intact. SOFT TISSUES:  No prevertebral soft tissue swelling. IMPRESSION: 1. No acute intracranial abnormality. 2. No acute fracture or traumatic malalignment of the cervical spine. Electronically signed by: Donnice Mania MD 07/06/2024 01:19 PM EDT RP Workstation: [id number]   CT Head Wo Contrast Result Date: 07/06/2024 EXAM: CT HEAD AND CERVICAL SPINE 07/06/2024 12:37:54 PM TECHNIQUE: CT of the head and cervical spine was performed without the administration of intravenous contrast. Multiplanar reformatted images are provided for review. Automated exposure control, iterative reconstruction, and/or weight based adjustment of the mA/kV was utilized to reduce the radiation dose to as low as reasonably achievable. COMPARISON: None available. CLINICAL HISTORY: Polytrauma, blunt. Pt BIB [facility] for hypertension and reported pt missed Monday's and today's hemodialysis. Reported that pt's sister stated he was hard to arouse this morning and did not go to dialysis. Pt states he did not know why he didn't go Monday. Reported that pt fell x 2 last night. Pt points to his left chest  when asked about pain. EMS reported BP 230/110 and CBG 141. FINDINGS: CT HEAD BRAIN AND VENTRICLES: No acute intracranial hemorrhage. No mass effect or midline shift. No abnormal extra-axial fluid collection. No evidence of acute infarct. No hydrocephalus. Nonspecific hypoattenuation in the periventricular and subcortical white matter, most likely representing chronic small vessel disease. Mild parenchymal volume loss. ORBITS: No acute abnormality. SINUSES AND MASTOIDS: No acute abnormality. SOFT TISSUES AND SKULL: No acute skull fracture. No acute soft tissue abnormality. CT CERVICAL SPINE BONES AND ALIGNMENT: No acute fracture or traumatic malalignment. Straightening of the normal cervical lordosis. DEGENERATIVE CHANGES: No high grade osseous spinal canal stenosis. Facet arthrosis and uncovertebral hypertrophy at multiple levels. Anterior cervical fusion hardware and interbody spacer at C4-5. Sequelae of laminectomy at C5 to C7 with posterior instrumented fusion spanning C4 to T1. Hardware is intact. SOFT TISSUES: No prevertebral soft tissue swelling. IMPRESSION: 1. No acute intracranial abnormality. 2. No acute fracture or traumatic malalignment of the cervical spine. Electronically signed by: Donnice Mania MD 07/06/2024 01:19 PM EDT RP Workstation: [id number]   DG Chest 2 View Result Date: 07/06/2024 CLINICAL DATA:  Weakness.  Fall. EXAM: CHEST - 2 VIEW COMPARISON:  05/09/2024. FINDINGS: The heart size and mediastinal contours are within normal limits. No focal consolidation, pleural effusion, or pneumothorax. Partially visualized lower cervical fusion. No acute osseous abnormality identified. IMPRESSION: No acute cardiopulmonary findings. Electronically Signed   By: Harrietta Sherry M.D.   On: 07/06/2024 12:53     Procedures   Medications Ordered in the ED  labetalol  (NORMODYNE ) injection 20 mg (has no administration in time range)  sodium zirconium cyclosilicate  (LOKELMA ) packet 10 g (has no  administration in time range)  calcium  gluconate 1 g/ 50 mL sodium chloride  IVPB (has no administration in time range)  sodium bicarbonate  injection 50 mEq (has no administration in time range)  Chlorhexidine  Gluconate Cloth 2 % PADS 6 each (has no administration in time range)  hydrALAZINE  (APRESOLINE ) injection 20 mg (has no administration in time range)  albuterol  (PROVENTIL ) (2.5 MG/3ML) 0.083% nebulizer solution 10 mg (10 mg Nebulization Given 07/06/24 1422)  haloperidol  lactate (HALDOL ) injection 10 mg (10 mg Intramuscular Given 07/06/24 1501)                                    Medical Decision Making Elderly male with hepatitis, hypertension, end-stage renal disease presents with weakness in the context of not going to dialysis.  Broad differential including  infection, fluid overload status, electrolyte abnormalities, encephalopathy. Cardiac 80 sinus normal pulse ox 100% room air normal  Amount and/or Complexity of Data Reviewed Independent Historian: EMS External Data Reviewed: notes. Labs: ordered. Decision-making details documented in ED Course. Radiology: ordered and independent interpretation performed. Decision-making details documented in ED Course. ECG/medicine tests: ordered and independent interpretation performed. Decision-making details documented in ED Course.  Risk Prescription drug management. Decision regarding hospitalization. Diagnosis or treatment significantly limited by social determinants of health.   Update: Patient now disagreeable with interventions, requiring multiple efforts for verbal support, and a sitter at bedside.  Update:, Patient remains hypertensive, disagreeable to medications, but agreeable to dialysis.  Chart review notable for prior episodes similar, as above, but with additional review some question as to the patient's using morphine  versus methadone which was a prior possible contributor to his altered mental status  presentations.   Update:, With persistent mild disagreeable behavior, patient received Haldol , IM, 10.  At bedside discussed his presentation with our nephrology colleague, Dr. Rayburn.  Patient has had similar presentations over the past month, but those have been more hypersomnolent rather than agitated.  With concern for ongoing agitation and possible uremic encephalopathy, patient will go to dialysis, will be admitted to the internal medicine team.  Patient much more agreeable after receiving Haldol , though he has not received all of the interventions indicated due to his hyperkalemia.  CRITICAL CARE Performed by: Lamar Salen Total critical care time: 35 minutes Critical care time was exclusive of separately billable procedures and treating other patients. Critical care was necessary to treat or prevent imminent or life-threatening deterioration. Critical care was time spent personally by me on the following activities: development of treatment plan with patient and/or surrogate as well as nursing, discussions with consultants, evaluation of patient's response to treatment, examination of patient, obtaining history from patient or surrogate, ordering and performing treatments and interventions, ordering and review of laboratory studies, ordering and review of radiographic studies, pulse oximetry and re-evaluation of patient's condition.   Final diagnoses:  Hypertensive urgency  Uremic encephalopathy  Hyperkalemia    ED Discharge Orders     None          Salen Lamar, MD 07/06/24 1517

## 2024-07-06 NOTE — ED Notes (Signed)
 Pt transported to dialysis, pt verbally agreed to dialysis.

## 2024-07-06 NOTE — ED Triage Notes (Signed)
 Pt BIB [facility] for hypertension and reported pt missed Monday's and today's hemodialysis. Reported that pt's sister stated he was hard to arouse this morning and did not go to dialysis. Pt states he did not know why he didn't go Monday.  Reported that pt fell x 2 last night. Pt points to his left chest when asked about pain. EMS reported BP 230/110 and CBG 141

## 2024-07-06 NOTE — Progress Notes (Signed)
 RT placed patient on CAT. Patient took treatment for approx 2 minutes before taking off and dumping most of the med from the cup. RT placed treatment back on patient and patient continues to take treatment off. Treatment discontinued at this time.

## 2024-07-06 NOTE — ED Notes (Signed)
 Pt refuses to stay in the bed, pt is a fall risk. Pt has pulled off telemetry leads. Pt pulled his bracelet off and refuses to put back on. CN notified.

## 2024-07-06 NOTE — Progress Notes (Addendum)
 Pt previously was a transient pt at out patient dialysis [facility] last month due to short term SNF placement.  Per [facility], he had his last treatment and since being d/c from nursing home, he was to resume back at outpatient clinic [facility].  Per [facility], this pt has a hx of noncompliance and was set to arrive at clinic to resume treatments this Monday, and did not show. Per [facility], they contacted sister, who stated she would be transporting him, set up an extra time yesterday at clinic, and they did not show to that either.   Per [facility], he still has a chair time MWF 1100am.   Will continue to assist as needed.  Lavanda Kentaro [place] Dialysis Navigator [phone] [facility] #[phone]   Addendum 4:08 Advised by clinic that pt may possibly be over self medicating at home with trazadone. MD informed at this time.

## 2024-07-06 NOTE — Progress Notes (Signed)
 The patient is constantly moving their body,so protective restraints are being used temporarily per Dr. Rayburn phone ordered.

## 2024-07-06 NOTE — Consult Note (Signed)
 [facility] Renal Consultation Note    Indication for Consultation:  Management of ESRD/hemodialysis; anemia, hypertension/volume and secondary hyperparathyroidism  HPI: Curtis Hartman is a 63 y.o. male with past medical history significant for hypertension, type 2 diabetes, dyslipidemia, stroke, ESRD on HD MWF at [facility] who presented to [facility] ED via EMS with falls at home and AMS.  In the ED, pt is confused and agitated.  Bp 227/108, HR 84, RR 19, SpO2 100%.  Labs notable for WBC 8.2, Hgb 11.2, Na 134, K 5.5, Cl 97, Co2 21, BUN 57, Cr 6.96, gluc 121.  CXR without acute cardiopulmonary findings.  CT scan of head and neck without acute abnormality.  He is being admitted by the hospital service and we were consulted to provide dialysis during his admission.    Pt is very agitated and knows that he is in a hospital but could not tell me which one or why he has not gone to HD this week.  He reports that he is in pain all over.  Has been difficult to redirect by nursing and has been pulling at his telemetry leads.  Past Medical History:  Diagnosis Date   Arthritis    CKD (chronic kidney disease)    COPD (chronic obstructive pulmonary disease) (HCC)    Diabetes mellitus without complication (HCC)    type 2   GERD (gastroesophageal reflux disease)    Headache    Hx Migraines   Hepatitis    Hypertension    Neuropathy    Past Surgical History:  Procedure Laterality Date   ANTERIOR CERVICAL DECOMP/DISCECTOMY FUSION N/A 11/05/2022   Procedure: Anterior Cervical Decompression/discectomy Fusion Cervical Four-Cervical Five;  Surgeon: Debby Dorn MATSU, MD;  Location: [facility] OR;  Service: Neurosurgery;  Laterality: N/A;  3C   ANTERIOR LAT LUMBAR FUSION N/A 09/01/2023   Procedure: DIRECT LUMBAR INTERBODY FUSION, LUMBAR TWO-LUMBAR THREE, LUMBAR THREE-LUMBAR FOUR, LUMBAR FOUR-LUMBAR FIVE, RIGHT PRONE TRANSPSOAS, LUMBAR TWO LUMBAR FIVE POSTERIOR PERCUTANEOUS FUSION LUMBAR FOUR-LUMBAR FIVE  MINIMALLY INVASIVE LAMINECTOMY, FORAMINOTOMY;  Surgeon: Debby Dorn MATSU, MD;  Location: [facility] OR;  Service: Neurosurgery;  Laterality: N/A;   AV FISTULA PLACEMENT Left 09/10/2023   Procedure: LEFT ARM ARTERIOVENOUS (AV) FISTULA CREATION;  Surgeon: Pearline Norman RAMAN, MD;  Location: [facility] OR;  Service: Vascular;  Laterality: Left;   BACK SURGERY  2023   Cervical Spine   BIOPSY  03/03/2022   Procedure: BIOPSY;  Surgeon: Cindie Carlin POUR, DO;  Location: [facility] END[facility] SUITE;  Service: Endoscopy;;   COLONOSCOPY WITH PROPOFOL  N/A 03/03/2022   Procedure: COLONOSCOPY WITH PROPOFOL ;  Surgeon: Cindie Carlin POUR, DO;  Location: [facility] SUITE;  Service: Endoscopy;  Laterality: N/A;  8:00am   ESOPHAGOGASTRODUODENOSCOPY (EGD) WITH PROPOFOL  N/A 03/03/2022   Procedure: ESOPHAGOGASTRODUODENOSCOPY (EGD) WITH PROPOFOL ;  Surgeon: Cindie Carlin POUR, DO;  Location: [facility] SUITE;  Service: Endoscopy;  Laterality: N/A;   foot right partial toe amputation Right    FOOT SURGERY Right    x3   hernia surgery x4     LUMBAR PERCUTANEOUS PEDICLE SCREW 3 LEVEL N/A 09/01/2023   Procedure: L2-5 LUMBAR PERCUTANEOUS PEDICLE SCREW 3 LEVEL;  Surgeon: Debby Dorn MATSU, MD;  Location: [facility] OR;  Service: Neurosurgery;  Laterality: N/A;   POLYPECTOMY  03/03/2022   Procedure: POLYPECTOMY;  Surgeon: Cindie Carlin POUR, DO;  Location: [facility];  Service: Endoscopy;;   Family History:   Family History  Problem Relation Age of Onset   Heart disease Mother  Seizures Father    Stroke Maternal Grandfather    Liver disease Paternal Grandmother    Social History:  reports that he has been smoking cigarettes. He has never been exposed to tobacco smoke. He has never used smokeless tobacco. He reports that he does not currently use alcohol. He reports that he does not currently use drugs. No Known Allergies Prior to Admission medications   Medication Sig Start Date End Date Taking? Authorizing Provider  amLODipine  (NORVASC ) 10 MG tablet Take  10 mg by mouth daily as needed (diastolic blood pressure above 90).    [provider]  calcitRIOL  (ROCALTROL ) 0.25 MCG capsule Take 0.25 mcg by mouth daily.    [provider]  Calcium  Acetate 667 MG TABS Take 1 tablet by mouth with breakfast, with lunch, and with evening meal.    [provider]  carvedilol  (COREG ) 6.25 MG tablet Take 1 tablet (6.25 mg total) by mouth 2 (two) times daily with a meal. 05/11/24   Tat, Alm, MD  diphenoxylate -atropine  (LOMOTIL ) 2.5-0.025 MG tablet Take 1 tablet by mouth 4 (four) times daily as needed for diarrhea or loose stools. 06/03/24   Johnson, Clanford L, MD  fluticasone  (FLONASE ) 50 MCG/ACT nasal spray Place 2 sprays into both nostrils daily. 09/24/23   Setzer, Sandra J, PA-C  ondansetron  (ZOFRAN ) 4 MG tablet Take 4 mg by mouth every 8 (eight) hours as needed for nausea or vomiting.    [provider]  oxybutynin  (DITROPAN -XL) 10 MG 24 hr tablet Take 10 mg by mouth daily. 02/23/24   [provider]  oxyCODONE  (OXY IR/ROXICODONE ) 5 MG immediate release tablet Take 1 tablet (5 mg total) by mouth every 6 (six) hours as needed for severe pain (pain score 7-10). 06/08/24   Tat, Alm, MD  senna (SENOKOT) 8.6 MG TABS tablet Take 1 tablet (8.6 mg total) by mouth daily as needed for mild constipation or moderate constipation. 06/03/24   Vicci Afton CROME, MD   Current Facility-Administered Medications  Medication Dose Route Frequency Provider Last Rate Last Admin   calcium  gluconate 1 g/ 50 mL sodium chloride  IVPB  1 g Intravenous Once Garrick Charleston, MD       labetalol  (NORMODYNE ) injection 20 mg  20 mg Intravenous Q10 min PRN Garrick Charleston, MD       sodium bicarbonate  injection 50 mEq  50 mEq Intravenous Once Garrick Charleston, MD       sodium zirconium cyclosilicate  (LOKELMA ) packet 10 g  10 g Oral Once Garrick Charleston, MD       Current Outpatient Medications  Medication Sig Dispense Refill   amLODipine  (NORVASC ) 10 MG  tablet Take 10 mg by mouth daily as needed (diastolic blood pressure above 90).     calcitRIOL  (ROCALTROL ) 0.25 MCG capsule Take 0.25 mcg by mouth daily.     Calcium  Acetate 667 MG TABS Take 1 tablet by mouth with breakfast, with lunch, and with evening meal.     carvedilol  (COREG ) 6.25 MG tablet Take 1 tablet (6.25 mg total) by mouth 2 (two) times daily with a meal. 60 tablet 1   diphenoxylate -atropine  (LOMOTIL ) 2.5-0.025 MG tablet Take 1 tablet by mouth 4 (four) times daily as needed for diarrhea or loose stools.     fluticasone  (FLONASE ) 50 MCG/ACT nasal spray Place 2 sprays into both nostrils daily.     ondansetron  (ZOFRAN ) 4 MG tablet Take 4 mg by mouth every 8 (eight) hours as needed for nausea or vomiting.     oxybutynin  (  DITROPAN -XL) 10 MG 24 hr tablet Take 10 mg by mouth daily.     oxyCODONE  (OXY IR/ROXICODONE ) 5 MG immediate release tablet Take 1 tablet (5 mg total) by mouth every 6 (six) hours as needed for severe pain (pain score 7-10). 10 tablet 0   senna (SENOKOT) 8.6 MG TABS tablet Take 1 tablet (8.6 mg total) by mouth daily as needed for mild constipation or moderate constipation. 120 tablet 0   Labs: Basic Metabolic Panel: Recent Labs  Lab 07/06/24 1241  NA 134*  K 5.5*  CL 97*  CO2 21*  GLUCOSE 121*  BUN 57*  CREATININE 6.96*  CALCIUM  9.2   Liver Function Tests: Recent Labs  Lab 07/06/24 1241  AST 19  ALT 7  ALKPHOS 66  BILITOT 1.1  PROT 6.7  ALBUMIN  3.3*   No results for input(s): LIPASE, AMYLASE in the last 168 hours. No results for input(s): AMMONIA in the last 168 hours. CBC: Recent Labs  Lab 07/06/24 1241  WBC 8.2  NEUTROABS 6.7  HGB 11.2*  HCT 34.5*  MCV 90.3  PLT 247   Cardiac Enzymes: No results for input(s): CKTOTAL, CKMB, CKMBINDEX, TROPONINI in the last 168 hours. CBG: No results for input(s): GLUCAP in the last 168 hours. Iron  Studies: No results for input(s): IRON , TIBC, TRANSFERRIN, FERRITIN in the last 72  hours. Studies/Results: CT Cervical Spine Wo Contrast Result Date: 07/06/2024 EXAM: CT HEAD AND CERVICAL SPINE 07/06/2024 12:37:54 PM TECHNIQUE: CT of the head and cervical spine was performed without the administration of intravenous contrast. Multiplanar reformatted images are provided for review. Automated exposure control, iterative reconstruction, and/or weight based adjustment of the mA/kV was utilized to reduce the radiation dose to as low as reasonably achievable. COMPARISON: None available. CLINICAL HISTORY: Polytrauma, blunt. Pt BIB [facility] for hypertension and reported pt missed Monday's and today's hemodialysis. Reported that pt's sister stated he was hard to arouse this morning and did not go to dialysis. Pt states he did not know why he didn't go Monday. Reported that pt fell x 2 last night. Pt points to his left chest when asked about pain. EMS reported BP 230/110 and CBG 141. FINDINGS: CT HEAD BRAIN AND VENTRICLES: No acute intracranial hemorrhage. No mass effect or midline shift. No abnormal extra-axial fluid collection. No evidence of acute infarct. No hydrocephalus. Nonspecific hypoattenuation in the periventricular and subcortical white matter, most likely representing chronic small vessel disease. Mild parenchymal volume loss. ORBITS: No acute abnormality. SINUSES AND MASTOIDS: No acute abnormality. SOFT TISSUES AND SKULL: No acute skull fracture. No acute soft tissue abnormality. CT CERVICAL SPINE BONES AND ALIGNMENT: No acute fracture or traumatic malalignment. Straightening of the normal cervical lordosis. DEGENERATIVE CHANGES: No high grade osseous spinal canal stenosis. Facet arthrosis and uncovertebral hypertrophy at multiple levels. Anterior cervical fusion hardware and interbody spacer at C4-5. Sequelae of laminectomy at C5 to C7 with posterior instrumented fusion spanning C4 to T1. Hardware is intact. SOFT TISSUES: No prevertebral soft tissue swelling. IMPRESSION: 1. No acute  intracranial abnormality. 2. No acute fracture or traumatic malalignment of the cervical spine. Electronically signed by: Donnice Mania MD 07/06/2024 01:19 PM EDT RP Workstation: [id number]   CT Head Wo Contrast Result Date: 07/06/2024 EXAM: CT HEAD AND CERVICAL SPINE 07/06/2024 12:37:54 PM TECHNIQUE: CT of the head and cervical spine was performed without the administration of intravenous contrast. Multiplanar reformatted images are provided for review. Automated exposure control, iterative reconstruction, and/or weight based adjustment of the mA/kV was utilized to reduce  the radiation dose to as low as reasonably achievable. COMPARISON: None available. CLINICAL HISTORY: Polytrauma, blunt. Pt BIB [facility] for hypertension and reported pt missed Monday's and today's hemodialysis. Reported that pt's sister stated he was hard to arouse this morning and did not go to dialysis. Pt states he did not know why he didn't go Monday. Reported that pt fell x 2 last night. Pt points to his left chest when asked about pain. EMS reported BP 230/110 and CBG 141. FINDINGS: CT HEAD BRAIN AND VENTRICLES: No acute intracranial hemorrhage. No mass effect or midline shift. No abnormal extra-axial fluid collection. No evidence of acute infarct. No hydrocephalus. Nonspecific hypoattenuation in the periventricular and subcortical white matter, most likely representing chronic small vessel disease. Mild parenchymal volume loss. ORBITS: No acute abnormality. SINUSES AND MASTOIDS: No acute abnormality. SOFT TISSUES AND SKULL: No acute skull fracture. No acute soft tissue abnormality. CT CERVICAL SPINE BONES AND ALIGNMENT: No acute fracture or traumatic malalignment. Straightening of the normal cervical lordosis. DEGENERATIVE CHANGES: No high grade osseous spinal canal stenosis. Facet arthrosis and uncovertebral hypertrophy at multiple levels. Anterior cervical fusion hardware and interbody spacer at C4-5. Sequelae of laminectomy at C5 to C7  with posterior instrumented fusion spanning C4 to T1. Hardware is intact. SOFT TISSUES: No prevertebral soft tissue swelling. IMPRESSION: 1. No acute intracranial abnormality. 2. No acute fracture or traumatic malalignment of the cervical spine. Electronically signed by: Donnice Mania MD 07/06/2024 01:19 PM EDT RP Workstation: [id number]   DG Chest 2 View Result Date: 07/06/2024 CLINICAL DATA:  Weakness.  Fall. EXAM: CHEST - 2 VIEW COMPARISON:  05/09/2024. FINDINGS: The heart size and mediastinal contours are within normal limits. No focal consolidation, pleural effusion, or pneumothorax. Partially visualized lower cervical fusion. No acute osseous abnormality identified. IMPRESSION: No acute cardiopulmonary findings. Electronically Signed   By: Harrietta Sherry M.D.   On: 07/06/2024 12:53    ROS: Review of systems not obtained due to patient factors. Physical Exam: Vitals:   07/06/24 1213 07/06/24 1242 07/06/24 1315 07/06/24 1422  BP:      Pulse: 79 80 84   Resp: 16 18 19    SpO2: 100% 100% 100% 100%  Weight:      Height:          Weight change:  No intake or output data in the 24 hours ending 07/06/24 1433 BP (!) 227/108   Pulse 84   Resp 19   Ht 5' 7.5 (1.715 m)   Wt 62.5 kg   SpO2 100%   BMI 21.26 kg/m  General appearance: slowed mentation and agitated Head: Normocephalic, without obvious abnormality, atraumatic Resp: clear to auscultation bilaterally Cardio: regular rate and rhythm, S1, S2 normal, no murmur, click, rub or gallop GI: soft, non-tender; bowel sounds normal; no masses,  no organomegaly Extremities: extremities normal, atraumatic, no cyanosis or edema and LUE AVF +T/B Dialysis Access:  LUE AVF  Dialysis Orders:  [facility], MWF, Elisio 17H Time 4:00, edw 62.5kg, LUE AVF BFR 350, DFR 600, 2K/2.5Ca bath Heparin  1000 units bolus then 1000 units/hr Venofer  50 mg IV once a week  Assessment/Plan:  Acute metabolic encephalopathy and falls - has had repeated  admissions with similar presentation for the past 3 months although he is more agitated this time.  He previous admissions were with lethargy and weakness.  He has ongoing usage of morphine  which was discontinued at his last discharge and unclear if he is taking more at home.  He also had  multiple  ischemic strokes in July.   Agree with admission and further workup.  Will follow his mental status after HD today.  Will need drug screen as well.    ESRD -   missed HD on Monday and today.  Will plan for HD today to get back on his outpatient schedule and monitor his response to HD.  Hypertension/volume  - markedly hypertensive but is at his edw.  Will challenge with HD today and follow BP.  Anemia  - Hgb stable, no ESA for now  Metabolic bone disease -   continue with home meds  Nutrition -  renal diet, carb modified.  Recurrent falls - had 2 falls at home per his sister to EMS.  CT of head and neck were unremarkable.  Will need fall precautions as well as PT/OT evaluation.   Fairy RONAL Sellar, MD [facility], [facility] 07/06/2024, 2:33 PM

## 2024-07-06 NOTE — Progress Notes (Signed)
 The patient was constantly restless, confused and unable to control their movement during dialysis, requiring the use of protective restraints. Cut off 30 mins d/t system clotted., over the three hour treatment, 2.5 liters of fluid were removed. The patient's blood pressure is elevated due to bodily rigidity and involuntary movements. The patient was transferred to the ICU.   07/06/24 1830  Vitals  Temp 98 F (36.7 C)  Temp Source Oral  BP (!) 170/107  BP Location Right Arm  BP Method Automatic  Patient Position (if appropriate) Lying  Pulse Rate 88  Resp 20  Oxygen Therapy  SpO2 100 %  O2 Device Room Air  During Treatment Monitoring  Intra-Hemodialysis Comments See progress note  Post Treatment  Dialyzer Clearance Heavily streaked  Hemodialysis Intake (mL) 0 mL  Liters Processed 60  Fluid Removed (mL) 2500 mL  Tolerated HD Treatment Yes  Post-Hemodialysis Comments see notes.  AVG/AVF Arterial Site Held (minutes) 10 minutes  AVG/AVF Venous Site Held (minutes) 10 minutes

## 2024-07-06 NOTE — H&P (Signed)
 History and Physical    Patient: Curtis Hartman FMW:[id number] DOB: Dec 30, 1960 DOA: 07/06/2024 DOS: the patient was seen and examined on 07/06/2024 PCP: Gerome Tillman LITTIE, FNP  Patient coming from: Home  Chief Complaint:  Chief Complaint  Patient presents with   Hypertension   HPI: Curtis Hartman is a 63 y.o. male with medical history significant   for hypertension, type 2 diabetes mellitus, COPD, ESRD on hemodialysis, recent CVA, and chronic pain who is brought in by EMS for increasing lethargy after missing hemodialysis sessions - Apparently he missed HD on 07/04/2024 - History obtained from ED staff patient Sister as patient is confused and incoherent - Report of fall x 2 within the last 24 hours - On admission BP was 230/110 Last HD session was 07/01/2024 -- No fevers reported, no emesis reported, -- Family reports that patient may have taken extra doses of his trazodone   WBC 8.2 hemoglobin 11.2 platelets 247,  Magnesium  1.9,  - Sodium is 134 potassium up to 5.5 bicarb 21 chloride 97 creatinine 6.96, BUN 57 LFTs are not elevated anion gap 16 - CT head without contrast and C-spine without acute findings -Chest x-ray without acute findings  Review of Systems: unable to review all systems due to the inability of the patient to answer questions. Past Medical History:  Diagnosis Date   Arthritis    CKD (chronic kidney disease)    COPD (chronic obstructive pulmonary disease) (HCC)    Diabetes mellitus without complication (HCC)    type 2   GERD (gastroesophageal reflux disease)    Headache    Hx Migraines   Hepatitis    Hypertension    Neuropathy    Past Surgical History:  Procedure Laterality Date   ANTERIOR CERVICAL DECOMP/DISCECTOMY FUSION N/A 11/05/2022   Procedure: Anterior Cervical Decompression/discectomy Fusion Cervical Four-Cervical Five;  Surgeon: Debby Dorn MATSU, MD;  Location: [facility] OR;  Service: Neurosurgery;  Laterality: N/A;  3C   ANTERIOR LAT LUMBAR FUSION N/A  09/01/2023   Procedure: DIRECT LUMBAR INTERBODY FUSION, LUMBAR TWO-LUMBAR THREE, LUMBAR THREE-LUMBAR FOUR, LUMBAR FOUR-LUMBAR FIVE, RIGHT PRONE TRANSPSOAS, LUMBAR TWO LUMBAR FIVE POSTERIOR PERCUTANEOUS FUSION LUMBAR FOUR-LUMBAR FIVE MINIMALLY INVASIVE LAMINECTOMY, FORAMINOTOMY;  Surgeon: Debby Dorn MATSU, MD;  Location: [facility] OR;  Service: Neurosurgery;  Laterality: N/A;   AV FISTULA PLACEMENT Left 09/10/2023   Procedure: LEFT ARM ARTERIOVENOUS (AV) FISTULA CREATION;  Surgeon: Pearline Norman RAMAN, MD;  Location: [facility] OR;  Service: Vascular;  Laterality: Left;   BACK SURGERY  2023   Cervical Spine   BIOPSY  03/03/2022   Procedure: BIOPSY;  Surgeon: Cindie Carlin POUR, DO;  Location: [facility] ENDO SUITE;  Service: Endoscopy;;   COLONOSCOPY WITH PROPOFOL  N/A 03/03/2022   Procedure: COLONOSCOPY WITH PROPOFOL ;  Surgeon: Cindie Carlin POUR, DO;  Location: [facility] SUITE;  Service: Endoscopy;  Laterality: N/A;  8:00am   ESOPHAGOGASTRODUODENOSCOPY (EGD) WITH PROPOFOL  N/A 03/03/2022   Procedure: ESOPHAGOGASTRODUODENOSCOPY (EGD) WITH PROPOFOL ;  Surgeon: Cindie Carlin POUR, DO;  Location: [facility] SU[facility];  Service: Endoscopy;  Laterality: N/A;   foot right partial toe amputation Right    FOOT SURGERY Right    x3   hernia surgery x4     LUMBAR PERCUTANEOUS PEDICLE SCREW 3 LEVEL N/A 09/01/2023   Procedure: L2-5 LUMBAR PERCUTANEOUS PEDICLE SCREW 3 LEVEL;  Surgeon: Debby Dorn MATSU, MD;  Location: [facility] OR;  Service: Neurosurgery;  Laterality: N/A;   POLYPECTOMY  03/03/2022   Procedure: POLYPECTOMY;  Surgeon: Cindie Carlin POUR, DO;  Location: [facility]  SUITE;  Service: Endoscopy;;   Social History:  reports that he has been smoking cigarettes. He has never been exposed to tobacco smoke. He has never used smokeless tobacco. He reports that he does not currently use alcohol. He reports that he does not currently use drugs.  No Known Allergies  Family History  Problem Relation Age of Onset   Heart disease Mother    Seizures  Father    Stroke Maternal Grandfather    Liver disease Paternal Grandmother     Prior to Admission medications   Medication Sig Start Date End Date Taking? Authorizing Provider  methocarbamol  (ROBAXIN ) 750 MG tablet Take 750 mg by mouth 3 (three) times daily. 06/29/24  Yes [provider]  amLODipine  (NORVASC ) 10 MG tablet Take 10 mg by mouth daily as needed (diastolic blood pressure above 90).    [provider]  calcitRIOL  (ROCALTROL ) 0.25 MCG capsule Take 0.25 mcg by mouth daily.    [provider]  Calcium  Acetate 667 MG TABS Take 1 tablet by mouth with breakfast, with lunch, and with evening meal.    [provider]  carvedilol  (COREG ) 6.25 MG tablet Take 1 tablet (6.25 mg total) by mouth 2 (two) times daily with a meal. 05/11/24   Tat, Alm, MD  diphenoxylate -atropine  (LOMOTIL ) 2.5-0.025 MG tablet Take 1 tablet by mouth 4 (four) times daily as needed for diarrhea or loose stools. 06/03/24   Johnson, Clanford L, MD  fluticasone  (FLONASE ) 50 MCG/ACT nasal spray Place 2 sprays into both nostrils daily. 09/24/23   Setzer, Sandra J, PA-C  LOKELMA  10 g PACK packet Take 10 g by mouth 3 (three) times daily. 06/21/24   [provider]  oxybutynin  (DITROPAN -XL) 10 MG 24 hr tablet Take 10 mg by mouth daily. 02/23/24   [provider]  oxyCODONE  (OXY IR/ROXICODONE ) 5 MG immediate release tablet Take 1 tablet (5 mg total) by mouth every 6 (six) hours as needed for severe pain (pain score 7-10). 06/08/24   Tat, Alm, MD  traZODone  (DESYREL ) 100 MG tablet Take 200 mg by mouth at bedtime as needed. 07/02/24   [provider]    Physical Exam: Vitals:   07/06/24 1700 07/06/24 1730 07/06/24 1800 07/06/24 1830  BP: (!) 185/111 (!) 147/94 (!) 170/110 (!) 170/107  Pulse: 82 88 84 88  Resp: 18 20 20 20   Temp:    98 F (36.7 C)  TempSrc:    Oral  SpO2:    100%  Weight:      Height:        Physical Exam Gen:-More awake, in no acute distress   HEENT:- [place].AT, No sclera icterus Neck-Supple Neck,No JVD,.  Lungs-  CTAB , fair air movement bilaterally CV- S1, S2 normal, RRR Abd-  +ve B.Sounds, Abd Soft, No tenderness,    Extremity/Skin:- No  edema,   good pedal pulses  Psych-confused, disoriented,  Neuro-no new focal deficits, no tremors, not follow instructions, disoriented and confused, moving extremities spontaneously MSK--left upper extremity AVF   Data Reviewed: WBC 8.2 hemoglobin 11.2 platelets 247,  Magnesium  1.9,  - Sodium is 134 potassium up to 5.5 bicarb 21 chloride 97 creatinine 6.96, BUN 57 LFTs are not elevated anion gap 16 - CT head without contrast and C-spine without acute findings -Chest x-ray without acute findings  Assessment and Plan:  1) acute metabolic encephalopathy-with recurrent falls--suspect due to uremia after missed hemodialysis sessions -Last HD session 07/01/2024 --CT head without contrast CT C-spine without acute findings -Anticipate  the patient will improve with hemodialysis sessions - Concerns about medication errors and extra trazodone  dosage- -anticipate improvement with hemodialysis  2) hyperkalemia--- in the setting of missed hemodialysis sessions -Use HD to address hyperkalemia  3)ESRD--- MWF schedule -Last HD 07/01/2024 -Nephrology consult appreciated -For HD today 07/06/2024 -- 4)Uncontrolled HTN--On admission BP was 230/110 - Suspect some degree of hypertensive urgency -- BP should improve with hemodialysis - Resume amlodipine  10 mg daily Coreg  6.25 mg twice daily May use IV labetalol  and  IV hydralazine  as needed per parameters    5. Type II DM  -05/06/24 A1C--4.9---reflecting excellent diabetic control PTA -previously on Lantus  back in 2022, but now diet controlled Use Novolog /Humalog Sliding scale insulin  with Accu-Cheks/Fingersticks as ordered    6. Anemia of CKD  - Hgb is 9.3, down from 11.5 earlier this month  - No overt bleeding   7. Chronic pain  - He was  prescribed 50 tabs of morphine  sulfate IR 30 mg on 05/27/24  - Morphine  should be avoided in ESRD patients, particularly given his recurrent presentations with encephalopathy   - Pt was counseled at length to avoid morphine  and he verbalized understanding - oxycodone  5 mg ordered PRN for severe pain only, follow up with his PCP who is prescribing his pain management.     8)   COPD  - Not in exacerbation   - Continue ICS-LAMA-LABA and as-needed SABA      Advance Care Planning:   Code Status: Full Code   Consults: Nephrology  Family Communication: Sister is Primary contact  Severity of Illness: The appropriate patient status for this patient is OBSERVATION. Observation status is judged to be reasonable and necessary in order to provide the required intensity of service to ensure the patient's safety. The patient's presenting symptoms, physical exam findings, and initial radiographic and laboratory data in the context of their medical condition is felt to place them at decreased risk for further clinical deterioration. Furthermore, it is anticipated that the patient will be medically stable for discharge from the hospital within 2 midnights of admission.   Author: Rendall Carwin, MD 07/06/2024 6:51 PM  For on call review [email or web address].

## 2024-07-06 NOTE — ED Notes (Addendum)
 Pt refuses to let this nurse apply telemetry back on or start IV.  EDP informed and spoke with pt, pt verbalized okay to put telemetry back on and IV start and meds.  EDP left the room and attempted to give the Lokelma , pt refused to take all of it.  Pt allowed to place back on telemetry. Pt then told this nurse to step away from him.  Unable to start IV and medications.

## 2024-07-07 DIAGNOSIS — N186 End stage renal disease: Secondary | ICD-10-CM | POA: Diagnosis not present

## 2024-07-07 DIAGNOSIS — Z992 Dependence on renal dialysis: Secondary | ICD-10-CM | POA: Diagnosis not present

## 2024-07-07 LAB — CBC
HCT: 32.3 % — ABNORMAL LOW (ref 39.0–52.0)
Hemoglobin: 10.7 g/dL — ABNORMAL LOW (ref 13.0–17.0)
MCH: 29.7 pg (ref 26.0–34.0)
MCHC: 33.1 g/dL (ref 30.0–36.0)
MCV: 89.7 fL (ref 80.0–100.0)
Platelets: 236 K/uL (ref 150–400)
RBC: 3.6 MIL/uL — ABNORMAL LOW (ref 4.22–5.81)
RDW: 13.9 % (ref 11.5–15.5)
WBC: 7.2 K/uL (ref 4.0–10.5)
nRBC: 0 % (ref 0.0–0.2)

## 2024-07-07 LAB — HEPATITIS B SURFACE ANTIGEN: Hepatitis B Surface Ag: NONREACTIVE

## 2024-07-07 LAB — COMPREHENSIVE METABOLIC PANEL WITH GFR
ALT: 7 U/L (ref 0–44)
AST: 22 U/L (ref 15–41)
Albumin: 3.2 g/dL — ABNORMAL LOW (ref 3.5–5.0)
Alkaline Phosphatase: 61 U/L (ref 38–126)
Anion gap: 16 — ABNORMAL HIGH (ref 5–15)
BUN: 40 mg/dL — ABNORMAL HIGH (ref 8–23)
CO2: 25 mmol/L (ref 22–32)
Calcium: 8.9 mg/dL (ref 8.9–10.3)
Chloride: 95 mmol/L — ABNORMAL LOW (ref 98–111)
Creatinine, Ser: 5.25 mg/dL — ABNORMAL HIGH (ref 0.61–1.24)
GFR, Estimated: 12 mL/min — ABNORMAL LOW (ref >60)
Glucose, Bld: 119 mg/dL — ABNORMAL HIGH (ref 70–99)
Potassium: 4.2 mmol/L (ref 3.5–5.1)
Sodium: 136 mmol/L (ref 135–145)
Total Bilirubin: 1.2 mg/dL (ref 0.0–1.2)
Total Protein: 6.4 g/dL — ABNORMAL LOW (ref 6.5–8.1)

## 2024-07-07 LAB — GLUCOSE, CAPILLARY
Glucose-Capillary: 109 mg/dL — ABNORMAL HIGH (ref 70–99)
Glucose-Capillary: 136 mg/dL — ABNORMAL HIGH (ref 70–99)
Glucose-Capillary: 109 mg/dL — ABNORMAL HIGH (ref 70–99)
Glucose-Capillary: 111 mg/dL — ABNORMAL HIGH (ref 70–99)

## 2024-07-07 LAB — HEPATITIS B SURFACE ANTIBODY, QUANTITATIVE: Hep B S AB Quant (Post): <3.5 m[IU]/mL — ABNORMAL LOW

## 2024-07-07 MED ORDER — ISOSORBIDE MONONITRATE ER 30 MG PO TB24
30.0000 mg | ORAL_TABLET | Freq: Every day | ORAL | Status: DC
  Administered 2024-07-07 – 2024-07-08 (×2): 30 mg via ORAL
  Filled 2024-07-07 (×2): qty 1

## 2024-07-07 MED ORDER — CARVEDILOL 12.5 MG PO TABS
12.5000 mg | ORAL_TABLET | Freq: Two times a day (BID) | ORAL | Status: DC
  Administered 2024-07-07 – 2024-07-08 (×3): 12.5 mg via ORAL
  Filled 2024-07-07 (×4): qty 1

## 2024-07-07 MED ORDER — HYDRALAZINE HCL 50 MG PO TABS
50.0000 mg | ORAL_TABLET | Freq: Three times a day (TID) | ORAL | Status: DC
  Administered 2024-07-07 – 2024-07-08 (×4): 50 mg via ORAL
  Filled 2024-07-07 (×4): qty 1

## 2024-07-07 MED ORDER — CLONIDINE HCL 0.1 MG PO TABS
0.1000 mg | ORAL_TABLET | Freq: Once | ORAL | Status: AC
  Administered 2024-07-07: 0.1 mg via ORAL
  Filled 2024-07-07: qty 1

## 2024-07-07 NOTE — Plan of Care (Signed)

## 2024-07-07 NOTE — Progress Notes (Signed)
 Patient ID: Curtis Hartman, male   DOB: Jan 27, 1961, 63 y.o.   MRN: [id number] S: Pt was agitated and moving while on HD and required restraints for safety and had to shorten HD by 30 minutes.  He is lethargic this morning but did receive some pain medications earlier. O:BP 139/77   Pulse 74   Temp (!) 97.5 F (36.4 C) (Oral)   Resp 16   Ht 6' (1.829 m)   Wt 60.6 kg   SpO2 100%   BMI 18.12 kg/m   Intake/Output Summary (Last 24 hours) at 07/07/2024 0950 Last data filed at 07/07/2024 0806 Gross per 24 hour  Intake 120 ml  Output 2500 ml  Net -2380 ml   Intake/Output: I/O last 3 completed shifts: In: 60 [P.O.:60] Out: 2500 [Other:2500]  Intake/Output this shift:  Total I/O In: 60 [P.O.:60] Out: -  Weight change:  Gen: lethargic, lying in bed in NAD CVS:RRR Resp:CTA Abd: +BS, soft, NT/ND Ext: no edema, LUE AVF +T/B  Recent Labs  Lab 07/06/24 1241 07/07/24 0508  NA 134* 136  K 5.5* 4.2  CL 97* 95*  CO2 21* 25  GLUCOSE 121* 119*  BUN 57* 40*  CREATININE 6.96* 5.25*  ALBUMIN  3.3* 3.2*  CALCIUM  9.2 8.9  AST 19 22  ALT 7 7   Liver Function Tests: Recent Labs  Lab 07/06/24 1241 07/07/24 0508  AST 19 22  ALT 7 7  ALKPHOS 66 61  BILITOT 1.1 1.2  PROT 6.7 6.4*  ALBUMIN  3.3* 3.2*   No results for input(s): LIPASE, AMYLASE in the last 168 hours. No results for input(s): AMMONIA in the last 168 hours. CBC: Recent Labs  Lab 07/06/24 1241 07/07/24 0508  WBC 8.2 7.2  NEUTROABS 6.7  --   HGB 11.2* 10.7*  HCT 34.5* 32.3*  MCV 90.3 89.7  PLT 247 236   Cardiac Enzymes: No results for input(s): CKTOTAL, CKMB, CKMBINDEX, TROPONINI in the last 168 hours. CBG: Recent Labs  Lab 07/06/24 2034 07/07/24 0735  GLUCAP 110* 109*    Iron  Studies: No results for input(s): IRON , TIBC, TRANSFERRIN, FERRITIN in the last 72 hours. Studies/Results: CT Cervical Spine Wo Contrast Result Date: 07/06/2024 EXAM: CT HEAD AND CERVICAL SPINE 07/06/2024  12:37:54 PM TECHNIQUE: CT of the head and cervical spine was performed without the administration of intravenous contrast. Multiplanar reformatted images are provided for review. Automated exposure control, iterative reconstruction, and/or weight based adjustment of the mA/kV was utilized to reduce the radiation dose to as low as reasonably achievable. COMPARISON: None available. CLINICAL HISTORY: Polytrauma, blunt. Pt BIB [facility] for hypertension and reported pt missed Monday's and today's hemodialysis. Reported that pt's sister stated he was hard to arouse this morning and did not go to dialysis. Pt states he did not know why he didn't go Monday. Reported that pt fell x 2 last night. Pt points to his left chest when asked about pain. EMS reported BP 230/110 and CBG 141. FINDINGS: CT HEAD BRAIN AND VENTRICLES: No acute intracranial hemorrhage. No mass effect or midline shift. No abnormal extra-axial fluid collection. No evidence of acute infarct. No hydrocephalus. Nonspecific hypoattenuation in the periventricular and subcortical white matter, most likely representing chronic small vessel disease. Mild parenchymal volume loss. ORBITS: No acute abnormality. SINUSES AND MASTOIDS: No acute abnormality. SOFT TISSUES AND SKULL: No acute skull fracture. No acute soft tissue abnormality. CT CERVICAL SPINE BONES AND ALIGNMENT: No acute fracture or traumatic malalignment. Straightening of the normal cervical lordosis. DEGENERATIVE CHANGES: No  high grade osseous spinal canal stenosis. Facet arthrosis and uncovertebral hypertrophy at multiple levels. Anterior cervical fusion hardware and interbody spacer at C4-5. Sequelae of laminectomy at C5 to C7 with posterior instrumented fusion spanning C4 to T1. Hardware is intact. SOFT TISSUES: No prevertebral soft tissue swelling. IMPRESSION: 1. No acute intracranial abnormality. 2. No acute fracture or traumatic malalignment of the cervical spine. Electronically signed by: Donnice Mania  MD 07/06/2024 01:19 PM EDT RP Workstation: [id number]   CT Head Wo Contrast Result Date: 07/06/2024 EXAM: CT HEAD AND CERVICAL SPINE 07/06/2024 12:37:54 PM TECHNIQUE: CT of the head and cervical spine was performed without the administration of intravenous contrast. Multiplanar reformatted images are provided for review. Automated exposure control, iterative reconstruction, and/or weight based adjustment of the mA/kV was utilized to reduce the radiation dose to as low as reasonably achievable. COMPARISON: None available. CLINICAL HISTORY: Polytrauma, blunt. Pt BIB [facility] for hypertension and reported pt missed Monday's and today's hemodialysis. Reported that pt's sister stated he was hard to arouse this morning and did not go to dialysis. Pt states he did not know why he didn't go Monday. Reported that pt fell x 2 last night. Pt points to his left chest when asked about pain. EMS reported BP 230/110 and CBG 141. FINDINGS: CT HEAD BRAIN AND VENTRICLES: No acute intracranial hemorrhage. No mass effect or midline shift. No abnormal extra-axial fluid collection. No evidence of acute infarct. No hydrocephalus. Nonspecific hypoattenuation in the periventricular and subcortical white matter, most likely representing chronic small vessel disease. Mild parenchymal volume loss. ORBITS: No acute abnormality. SINUSES AND MASTOIDS: No acute abnormality. SOFT TISSUES AND SKULL: No acute skull fracture. No acute soft tissue abnormality. CT CERVICAL SPINE BONES AND ALIGNMENT: No acute fracture or traumatic malalignment. Straightening of the normal cervical lordosis. DEGENERATIVE CHANGES: No high grade osseous spinal canal stenosis. Facet arthrosis and uncovertebral hypertrophy at multiple levels. Anterior cervical fusion hardware and interbody spacer at C4-5. Sequelae of laminectomy at C5 to C7 with posterior instrumented fusion spanning C4 to T1. Hardware is intact. SOFT TISSUES: No prevertebral soft tissue swelling. IMPRESSION:  1. No acute intracranial abnormality. 2. No acute fracture or traumatic malalignment of the cervical spine. Electronically signed by: Donnice Mania MD 07/06/2024 01:19 PM EDT RP Workstation: [id number]   DG Chest 2 View Result Date: 07/06/2024 CLINICAL DATA:  Weakness.  Fall. EXAM: CHEST - 2 VIEW COMPARISON:  05/09/2024. FINDINGS: The heart size and mediastinal contours are within normal limits. No focal consolidation, pleural effusion, or pneumothorax. Partially visualized lower cervical fusion. No acute osseous abnormality identified. IMPRESSION: No acute cardiopulmonary findings. Electronically Signed   By: Harrietta Sherry M.D.   On: 07/06/2024 12:53    amLODipine   10 mg Oral Daily   calcitRIOL   0.25 mcg Oral Daily   calcium  acetate  667 mg Oral TID with meals   carvedilol   12.5 mg Oral BID WC   Chlorhexidine  Gluconate Cloth  6 each Topical Q0600   fluticasone   2 spray Each Nare Daily   heparin   5,000 Units Subcutaneous Q8H   insulin  aspart  0-5 Units Subcutaneous QHS   insulin  aspart  0-9 Units Subcutaneous TID WC   methocarbamol   750 mg Oral TID   mupirocin  ointment  1 Application Nasal BID   oxybutynin   10 mg Oral Daily   sodium bicarbonate   50 mEq Intravenous Once   sodium chloride  flush  3 mL Intravenous Q12H   sodium chloride  flush  3 mL Intravenous Q12H   sodium  zirconium cyclosilicate  10 g Oral TID    BMET    Component Value Date/Time   NA 136 07/07/2024 0508   K 4.2 07/07/2024 0508   CL 95 (L) 07/07/2024 0508   CO2 25 07/07/2024 0508   GLUCOSE 119 (H) 07/07/2024 0508   BUN 40 (H) 07/07/2024 0508   CREATININE 5.25 (H) 07/07/2024 0508   CREATININE 2.78 (H) 11/27/2022 1317   CALCIUM  8.9 07/07/2024 0508   GFRNONAA 12 (L) 07/07/2024 0508   CBC    Component Value Date/Time   WBC 7.2 07/07/2024 0508   RBC 3.60 (L) 07/07/2024 0508   HGB 10.7 (L) 07/07/2024 0508   HCT 32.3 (L) 07/07/2024 0508   PLT 236 07/07/2024 0508   MCV 89.7 07/07/2024 0508   MCH 29.7 07/07/2024  0508   MCHC 33.1 07/07/2024 0508   RDW 13.9 07/07/2024 0508   LYMPHSABS 1.0 07/06/2024 1241   MONOABS 0.4 07/06/2024 1241   EOSABS 0.1 07/06/2024 1241   BASOSABS 0.1 07/06/2024 1241     Dialysis Orders:  [facility], MWF, Elisio 17H Time 4:00, edw 62.5kg, LUE AVF BFR 350, DFR 600, 2K/2.5Ca bath Heparin  1000 units bolus then 1000 units/hr Venofer  50 mg IV once a week   Assessment/Plan:  Acute metabolic encephalopathy and falls - has had repeated admissions with similar presentation for the past 3 months although he is more agitated this time.  He previous admissions were with lethargy and weakness.  He has ongoing usage of morphine  which was discontinued at his last discharge and unclear if he is taking more at home.  He also had  multiple ischemic strokes in July.   Agree with admission and further workup.  His mental status did not significantly improve after HD.  Drug screen pending.  May need to repeat MRI to rule out recurrent/worsening CVA, however he will not remain still so doubt it would be of good quality.  Continue with current management and follow for recovery.      ESRD -   missed HD on Monday and today.  Will plan for HD tomorrow to remain on his outpatient schedule MWF and monitor his response to HD. Avoid nephrotoxic medications including NSAIDs and iodinated intravenous contrast exposure unless the latter is absolutely indicated.   Preferred narcotic agents for pain control are hydromorphone , fentanyl , and methadone. Morphine  should not be used.  Avoid Baclofen and avoid oral sodium phosphate  and magnesium  citrate based laxatives / bowel preps.  Continue strict Input and Output monitoring. Will monitor the patient closely with you and intervene or adjust therapy as indicated by changes in clinical status/labs   Hypertension/volume  - markedly hypertensive but is at his edw.  Will challenge with HD today and follow BP.  Anemia  - Hgb stable, no ESA for now  Metabolic bone  disease -   continue with home meds  Nutrition -  renal diet, carb modified.  Recurrent falls - had 2 falls at home per his sister to EMS.  CT of head and neck were unremarkable.  Will need fall precautions as well as PT/OT evaluation.   Fairy RONAL Sellar, MD [facility]

## 2024-07-07 NOTE — Progress Notes (Signed)
 PROGRESS NOTE  Curtis Hartman, is a 63 y.o. male, DOB - [birth year], FMW:[id number]  Admit date - 07/06/2024   Admitting Physician Ej Pinson Pearlean, MD  Outpatient Primary MD for the patient is Gerome Tillman CROME, FNP  LOS - 0  Chief Complaint  Patient presents with   Hypertension       Brief Narrative:  63 y.o. male with medical history significant   for hypertension, type 2 diabetes mellitus, COPD, ESRD on hemodialysis, recent CVA, and chronic pain admitted on 07/06/2024 with acute metabolic encephalopathy after missed hemodialysis and taking extra doses of trazodone    -Assessment and Plan: 1) acute metabolic encephalopathy-with recurrent falls--suspect due to uremia after missed hemodialysis sessions -Last HD session prior to admission was  07/01/2024 --CT head without contrast CT C-spine without acute findings -Anticipate the patient will improve with hemodialysis sessions - Concerns about medication errors and extra trazodone  dosage- -patient had incomplete hemodialysis session on 07/06/2024 twice daily Hospital due to patient transition around and not being very cooperative -anticipate improvement with further hemodialysis --Sadly trazodone  apparently does not get dialyzed --Patient remains confused, lethargic, disoriented   2)Hyperkalemia--- in the setting of missed hemodialysis sessions -Use HD to address hyperkalemia -Normalized after HD   3)ESRD--- MWF schedule -Nephrology consult appreciated -Hemodialysis as outlined above #1 -- 4)Uncontrolled HTN--On admission BP was 230/110 - Suspect some degree of hypertensive urgency -- BP should improve with hemodialysis - Resume amlodipine  10 mg daily Coreg  6.25 mg twice daily May use IV labetalol  and  IV hydralazine  as needed per parameters    5. Type II DM  -05/06/24 A1C--4.9---reflecting excellent diabetic control PTA -previously on Lantus  back in 2022, but now diet controlled Use Novolog /Humalog Sliding scale insulin  with  Accu-Cheks/Fingersticks as ordered    6. Anemia of ESRD  - Hgb stable, >> 10   - No overt bleeding   7. Chronic pain  - He was prescribed 50 tabs of morphine  sulfate IR 30 mg on 05/27/24  - Morphine  should be avoided in ESRD patients, particularly given his recurrent presentations with encephalopathy   - Pt was counseled at length to avoid morphine  and he verbalized understanding - oxycodone  5 mg ordered PRN for severe pain only, follow up with his PCP who is prescribing his pain management.     8) COPD  - Not in exacerbation   - Continue ICS-LAMA-LABA and as-needed SABA    9)Social/Ethics----Left Voicemail at [phone] for Sister  Sandra Lose  (who pt lives with pt). -spoke with Gevorg Brum at [phone]    Status is: Inpatient   Disposition: The patient is from: Home              Anticipated d/c is to: Home              Anticipated d/c date is: 1 day              Patient currently is not medically stable to d/c. Barriers: Not Clinically Stable-   Code Status :  -  Code Status: Full Code   Family Communication:     (patient is alert, awake and coherent)  Discussed with sister Levorn--- DVT Prophylaxis  :   - SCDs   heparin  injection 5,000 Units Start: 07/06/24 2200 SCDs Start: 07/06/24 1517 Place TED hose Start: 07/06/24 1517   Lab Results  Component Value Date   PLT 236 07/07/2024   Inpatient Medications  Scheduled Meds:  amLODipine   10 mg Oral Daily   calcitRIOL   0.25  mcg Oral Daily   calcium  acetate  667 mg Oral TID with meals   carvedilol   12.5 mg Oral BID WC   Chlorhexidine  Gluconate Cloth  6 each Topical Q0600   cloNIDine   0.1 mg Oral Once   fluticasone   2 spray Each Nare Daily   heparin   5,000 Units Subcutaneous Q8H   hydrALAZINE   50 mg Oral TID   insulin  aspart  0-5 Units Subcutaneous QHS   insulin  aspart  0-9 Units Subcutaneous TID WC   isosorbide  mononitrate  30 mg Oral Daily   methocarbamol   750 mg Oral TID   mupirocin  ointment  1  Application Nasal BID   oxybutynin   10 mg Oral Daily   sodium bicarbonate   50 mEq Intravenous Once   sodium chloride  flush  3 mL Intravenous Q12H   sodium chloride  flush  3 mL Intravenous Q12H   sodium zirconium cyclosilicate   10 g Oral TID   Continuous Infusions:  calcium  gluconate     PRN Meds:.acetaminophen  **OR** acetaminophen , bisacodyl , diphenoxylate -atropine , hydrALAZINE , labetalol , ondansetron  **OR** ondansetron  (ZOFRAN ) IV, mouth rinse, oxyCODONE , polyethylene glycol, sodium chloride  flush, traZODone    Anti-infectives (From admission, onward)    None      Subjective: Curtis Hartman today has no fevers, no emesis,  No chest pain,  - Remains confused and disoriented 1:1 sitter at bedside - I called and updated sister Levorn  Objective: Vitals:   07/07/24 1000 07/07/24 1100 07/07/24 1140 07/07/24 1624  BP: (!) 182/85 (!) 188/84    Pulse: 74 70    Resp: 16 19    Temp:   98.4 F (36.9 C) 97.7 F (36.5 C)  TempSrc:   Oral Oral  SpO2: 100% 95%    Weight:      Height:        Intake/Output Summary (Last 24 hours) at 07/07/2024 1704 Last data filed at 07/07/2024 1420 Gross per 24 hour  Intake 120 ml  Output 2800 ml  Net -2680 ml   Filed Weights   07/06/24 1209 07/06/24 1534 07/06/24 1900  Weight: 62.5 kg 63 kg 60.6 kg   Physical Exam Gen:- Awake Alert,  in no apparent distress  HEENT:- [place].AT, No sclera icterus Neck-Supple Neck,No JVD,.  Lungs-  CTAB , fair symmetrical air movement CV- S1, S2 normal, regular  Abd-  +ve B.Sounds, Abd Soft, No tenderness,    Extremity/Skin:- No  edema, pedal pulses present  Psych-affect is anxious, patient disoriented and confused Neuro-no new focal deficits, no tremors, confused and thrashing around MSK--left upper extremity AVF with thrill and bruit  Data Reviewed: I have personally reviewed following labs and imaging studies  CBC: Recent Labs  Lab 07/06/24 1241 07/07/24 0508  WBC 8.2 7.2  NEUTROABS 6.7  --   HGB 11.2*  10.7*  HCT 34.5* 32.3*  MCV 90.3 89.7  PLT 247 236   Basic Metabolic Panel: Recent Labs  Lab 07/06/24 1241 07/07/24 0508  NA 134* 136  K 5.5* 4.2  CL 97* 95*  CO2 21* 25  GLUCOSE 121* 119*  BUN 57* 40*  CREATININE 6.96* 5.25*  CALCIUM  9.2 8.9  MG 1.9  --    GFR: Estimated Creatinine Clearance: 12.3 mL/min (A) (by C-G formula based on SCr of 5.25 mg/dL (H)). Liver Function Tests: Recent Labs  Lab 07/06/24 1241 07/07/24 0508  AST 19 22  ALT 7 7  ALKPHOS 66 61  BILITOT 1.1 1.2  PROT 6.7 6.4*  ALBUMIN  3.3* 3.2*   Recent Results (from the past  240 hours)  MRSA Next Gen by PCR, Nasal     Status: Abnormal   Collection Time: 07/06/24  7:05 PM   Specimen: Nasal Mucosa; Nasal Swab  Result Value Ref Range Status   MRSA by PCR Next Gen DETECTED (A) NOT DETECTED Final    Comment: RESULT CALLED TO, READ BACK BY AND VERIFIED WITH: R. ESTOCE [id number], VIRAY,J (NOTE) The GeneXpert MRSA Assay (FDA approved for NASAL specimens only), is one component of a comprehensive MRSA colonization surveillance program. It is not intended to diagnose MRSA infection nor to guide or monitor treatment for MRSA infections. Test performance is not FDA approved in patients less than 41 years old. Performed at [facility], [address], [place], [place] [address]    Radiology Studies: CT Cervical Spine Wo Contrast Result Date: 07/06/2024 EXAM: CT HEAD AND CERVICAL SPINE 07/06/2024 12:37:54 PM TECHNIQUE: CT of the head and cervical spine was performed without the administration of intravenous contrast. Multiplanar reformatted images are provided for review. Automated exposure control, iterative reconstruction, and/or weight based adjustment of the mA/kV was utilized to reduce the radiation dose to as low as reasonably achievable. COMPARISON: None available. CLINICAL HISTORY: Polytrauma, blunt. Pt BIB [facility] for hypertension and reported pt missed Monday's and today's hemodialysis. Reported that pt's  sister stated he was hard to arouse this morning and did not go to dialysis. Pt states he did not know why he didn't go Monday. Reported that pt fell x 2 last night. Pt points to his left chest when asked about pain. EMS reported BP 230/110 and CBG 141. FINDINGS: CT HEAD BRAIN AND VENTRICLES: No acute intracranial hemorrhage. No mass effect or midline shift. No abnormal extra-axial fluid collection. No evidence of acute infarct. No hydrocephalus. Nonspecific hypoattenuation in the periventricular and subcortical white matter, most likely representing chronic small vessel disease. Mild parenchymal volume loss. ORBITS: No acute abnormality. SINUSES AND MASTOIDS: No acute abnormality. SOFT TISSUES AND SKULL: No acute skull fracture. No acute soft tissue abnormality. CT CERVICAL SPINE BONES AND ALIGNMENT: No acute fracture or traumatic malalignment. Straightening of the normal cervical lordosis. DEGENERATIVE CHANGES: No high grade osseous spinal canal stenosis. Facet arthrosis and uncovertebral hypertrophy at multiple levels. Anterior cervical fusion hardware and interbody spacer at C4-5. Sequelae of laminectomy at C5 to C7 with posterior instrumented fusion spanning C4 to T1. Hardware is intact. SOFT TISSUES: No prevertebral soft tissue swelling. IMPRESSION: 1. No acute intracranial abnormality. 2. No acute fracture or traumatic malalignment of the cervical spine. Electronically signed by: Donnice Mania MD 07/06/2024 01:19 PM EDT RP Workstation: [id number]   CT Head Wo Contrast Result Date: 07/06/2024 EXAM: CT HEAD AND CERVICAL SPINE 07/06/2024 12:37:54 PM TECHNIQUE: CT of the head and cervical spine was performed without the administration of intravenous contrast. Multiplanar reformatted images are provided for review. Automated exposure control, iterative reconstruction, and/or weight based adjustment of the mA/kV was utilized to reduce the radiation dose to as low as reasonably achievable. COMPARISON: None  available. CLINICAL HISTORY: Polytrauma, blunt. Pt BIB [facility] for hypertension and reported pt missed Monday's and today's hemodialysis. Reported that pt's sister stated he was hard to arouse this morning and did not go to dialysis. Pt states he did not know why he didn't go Monday. Reported that pt fell x 2 last night. Pt points to his left chest when asked about pain. EMS reported BP 230/110 and CBG 141. FINDINGS: CT HEAD BRAIN AND VENTRICLES: No acute intracranial hemorrhage. No mass effect or midline shift.  No abnormal extra-axial fluid collection. No evidence of acute infarct. No hydrocephalus. Nonspecific hypoattenuation in the periventricular and subcortical white matter, most likely representing chronic small vessel disease. Mild parenchymal volume loss. ORBITS: No acute abnormality. SINUSES AND MASTOIDS: No acute abnormality. SOFT TISSUES AND SKULL: No acute skull fracture. No acute soft tissue abnormality. CT CERVICAL SPINE BONES AND ALIGNMENT: No acute fracture or traumatic malalignment. Straightening of the normal cervical lordosis. DEGENERATIVE CHANGES: No high grade osseous spinal canal stenosis. Facet arthrosis and uncovertebral hypertrophy at multiple levels. Anterior cervical fusion hardware and interbody spacer at C4-5. Sequelae of laminectomy at C5 to C7 with posterior instrumented fusion spanning C4 to T1. Hardware is intact. SOFT TISSUES: No prevertebral soft tissue swelling. IMPRESSION: 1. No acute intracranial abnormality. 2. No acute fracture or traumatic malalignment of the cervical spine. Electronically signed by: Donnice Mania MD 07/06/2024 01:19 PM EDT RP Workstation: [id number]   DG Chest 2 View Result Date: 07/06/2024 CLINICAL DATA:  Weakness.  Fall. EXAM: CHEST - 2 VIEW COMPARISON:  05/09/2024. FINDINGS: The heart size and mediastinal contours are within normal limits. No focal consolidation, pleural effusion, or pneumothorax. Partially visualized lower cervical fusion. No acute  osseous abnormality identified. IMPRESSION: No acute cardiopulmonary findings. Electronically Signed   By: Harrietta Sherry M.D.   On: 07/06/2024 12:53   Scheduled Meds:  amLODipine   10 mg Oral Daily   calcitRIOL   0.25 mcg Oral Daily   calcium  acetate  667 mg Oral TID with meals   carvedilol   12.5 mg Oral BID WC   Chlorhexidine  Gluconate Cloth  6 each Topical Q0600   cloNIDine   0.1 mg Oral Once   fluticasone   2 spray Each Nare Daily   heparin   5,000 Units Subcutaneous Q8H   hydrALAZINE   50 mg Oral TID   insulin  aspart  0-5 Units Subcutaneous QHS   insulin  aspart  0-9 Units Subcutaneous TID WC   isosorbide  mononitrate  30 mg Oral Daily   methocarbamol   750 mg Oral TID   mupirocin  ointment  1 Application Nasal BID   oxybutynin   10 mg Oral Daily   sodium bicarbonate   50 mEq Intravenous Once   sodium chloride  flush  3 mL Intravenous Q12H   sodium chloride  flush  3 mL Intravenous Q12H   sodium zirconium cyclosilicate   10 g Oral TID   Continuous Infusions:  calcium  gluconate      LOS: 0 days   Rendall Carwin M.D on 07/07/2024 at 5:04 PM  Go to [email or web address] - for contact info  [facility] - Office  [phone]  If 7PM-7AM, please contact night-coverage [email or web address] 07/07/2024, 5:04 PM

## 2024-07-07 NOTE — Plan of Care (Signed)
  Problem: Clinical Measurements: Goal: Respiratory complications will improve Outcome: Progressing Goal: Cardiovascular complication will be avoided Outcome: Progressing   Problem: Coping: Goal: Level of anxiety will decrease Outcome: Progressing   Problem: Elimination: Goal: Will not experience complications related to urinary retention Outcome: Progressing   Problem: Coping: Goal: Ability to adjust to condition or change in health will improve Outcome: Progressing

## 2024-07-08 DIAGNOSIS — Z91A48 Caregiver's other noncompliance with patient's medication regimen for other reason: Secondary | ICD-10-CM | POA: Diagnosis not present

## 2024-07-08 DIAGNOSIS — Z91158 Patient's noncompliance with renal dialysis for other reason: Secondary | ICD-10-CM | POA: Diagnosis not present

## 2024-07-08 DIAGNOSIS — G9341 Metabolic encephalopathy: Secondary | ICD-10-CM | POA: Diagnosis present

## 2024-07-08 DIAGNOSIS — F17210 Nicotine dependence, cigarettes, uncomplicated: Secondary | ICD-10-CM | POA: Diagnosis present

## 2024-07-08 DIAGNOSIS — N2581 Secondary hyperparathyroidism of renal origin: Secondary | ICD-10-CM | POA: Diagnosis present

## 2024-07-08 DIAGNOSIS — Z820 Family history of epilepsy and other diseases of the nervous system: Secondary | ICD-10-CM | POA: Diagnosis not present

## 2024-07-08 DIAGNOSIS — F1120 Opioid dependence, uncomplicated: Secondary | ICD-10-CM | POA: Diagnosis present

## 2024-07-08 DIAGNOSIS — Z79899 Other long term (current) drug therapy: Secondary | ICD-10-CM | POA: Diagnosis not present

## 2024-07-08 DIAGNOSIS — Z8673 Personal history of transient ischemic attack (TIA), and cerebral infarction without residual deficits: Secondary | ICD-10-CM | POA: Diagnosis not present

## 2024-07-08 DIAGNOSIS — E1122 Type 2 diabetes mellitus with diabetic chronic kidney disease: Secondary | ICD-10-CM | POA: Diagnosis present

## 2024-07-08 DIAGNOSIS — Z8349 Family history of other endocrine, nutritional and metabolic diseases: Secondary | ICD-10-CM | POA: Diagnosis not present

## 2024-07-08 DIAGNOSIS — E875 Hyperkalemia: Secondary | ICD-10-CM | POA: Diagnosis present

## 2024-07-08 DIAGNOSIS — Z823 Family history of stroke: Secondary | ICD-10-CM | POA: Diagnosis not present

## 2024-07-08 DIAGNOSIS — W19XXXA Unspecified fall, initial encounter: Secondary | ICD-10-CM | POA: Diagnosis present

## 2024-07-08 DIAGNOSIS — T43211A Poisoning by selective serotonin and norepinephrine reuptake inhibitors, accidental (unintentional), initial encounter: Secondary | ICD-10-CM | POA: Diagnosis present

## 2024-07-08 DIAGNOSIS — Z8249 Family history of ischemic heart disease and other diseases of the circulatory system: Secondary | ICD-10-CM | POA: Diagnosis not present

## 2024-07-08 DIAGNOSIS — N186 End stage renal disease: Secondary | ICD-10-CM | POA: Diagnosis present

## 2024-07-08 DIAGNOSIS — Z781 Physical restraint status: Secondary | ICD-10-CM | POA: Diagnosis not present

## 2024-07-08 DIAGNOSIS — I120 Hypertensive chronic kidney disease with stage 5 chronic kidney disease or end stage renal disease: Secondary | ICD-10-CM | POA: Diagnosis present

## 2024-07-08 DIAGNOSIS — D631 Anemia in chronic kidney disease: Secondary | ICD-10-CM | POA: Diagnosis present

## 2024-07-08 DIAGNOSIS — G9349 Other encephalopathy: Secondary | ICD-10-CM | POA: Diagnosis present

## 2024-07-08 DIAGNOSIS — Y92009 Unspecified place in unspecified non-institutional (private) residence as the place of occurrence of the external cause: Secondary | ICD-10-CM | POA: Diagnosis not present

## 2024-07-08 DIAGNOSIS — G8929 Other chronic pain: Secondary | ICD-10-CM | POA: Diagnosis present

## 2024-07-08 DIAGNOSIS — J449 Chronic obstructive pulmonary disease, unspecified: Secondary | ICD-10-CM | POA: Diagnosis present

## 2024-07-08 DIAGNOSIS — R296 Repeated falls: Secondary | ICD-10-CM | POA: Diagnosis present

## 2024-07-08 DIAGNOSIS — Z992 Dependence on renal dialysis: Secondary | ICD-10-CM | POA: Diagnosis not present

## 2024-07-08 DIAGNOSIS — I160 Hypertensive urgency: Secondary | ICD-10-CM | POA: Diagnosis present

## 2024-07-08 DIAGNOSIS — E785 Hyperlipidemia, unspecified: Secondary | ICD-10-CM | POA: Diagnosis present

## 2024-07-08 LAB — RENAL FUNCTION PANEL
Albumin: 3.1 g/dL — ABNORMAL LOW (ref 3.5–5.0)
Anion gap: 14 (ref 5–15)
BUN: 51 mg/dL — ABNORMAL HIGH (ref 8–23)
CO2: 26 mmol/L (ref 22–32)
Calcium: 9.1 mg/dL (ref 8.9–10.3)
Chloride: 99 mmol/L (ref 98–111)
Creatinine, Ser: 6.51 mg/dL — ABNORMAL HIGH (ref 0.61–1.24)
GFR, Estimated: 9 mL/min — ABNORMAL LOW (ref >60)
Glucose, Bld: 102 mg/dL — ABNORMAL HIGH (ref 70–99)
Phosphorus: 7.3 mg/dL — ABNORMAL HIGH (ref 2.5–4.6)
Potassium: 4.5 mmol/L (ref 3.5–5.1)
Sodium: 139 mmol/L (ref 135–145)

## 2024-07-08 LAB — CBC
HCT: 30.7 % — ABNORMAL LOW (ref 39.0–52.0)
Hemoglobin: 10 g/dL — ABNORMAL LOW (ref 13.0–17.0)
MCH: 30.1 pg (ref 26.0–34.0)
MCHC: 32.6 g/dL (ref 30.0–36.0)
MCV: 92.5 fL (ref 80.0–100.0)
Platelets: 267 K/uL (ref 150–400)
RBC: 3.32 MIL/uL — ABNORMAL LOW (ref 4.22–5.81)
RDW: 14.3 % (ref 11.5–15.5)
WBC: 7.5 K/uL (ref 4.0–10.5)
nRBC: 0 % (ref 0.0–0.2)

## 2024-07-08 LAB — GLUCOSE, CAPILLARY
Glucose-Capillary: 99 mg/dL (ref 70–99)
Glucose-Capillary: 109 mg/dL — ABNORMAL HIGH (ref 70–99)
Glucose-Capillary: 118 mg/dL — ABNORMAL HIGH (ref 70–99)

## 2024-07-08 MED ORDER — CARVEDILOL 3.125 MG PO TABS: 3.1250 mg | ORAL_TABLET | Freq: Two times a day (BID) | ORAL | Status: DC

## 2024-07-08 MED ORDER — AMLODIPINE BESYLATE 5 MG PO TABS: 5.0000 mg | ORAL_TABLET | Freq: Every day | ORAL | 11 refills | Status: DC

## 2024-07-08 MED ORDER — CARVEDILOL 6.25 MG PO TABS: 6.2500 mg | ORAL_TABLET | Freq: Two times a day (BID) | ORAL | 1 refills | Status: AC

## 2024-07-08 MED ORDER — ASPIRIN 81 MG PO TBEC
81.0000 mg | DELAYED_RELEASE_TABLET | Freq: Every day | ORAL | Status: DC
  Administered 2024-07-08: 81 mg via ORAL
  Filled 2024-07-08: qty 1

## 2024-07-08 MED ORDER — ACETAMINOPHEN 325 MG PO TABS: 650.0000 mg | ORAL_TABLET | Freq: Four times a day (QID) | ORAL | Status: AC | PRN

## 2024-07-08 MED ORDER — ATORVASTATIN CALCIUM 20 MG PO TABS
20.0000 mg | ORAL_TABLET | Freq: Every day | ORAL | Status: DC
  Administered 2024-07-08: 20 mg via ORAL
  Filled 2024-07-08: qty 1

## 2024-07-08 MED ORDER — LOKELMA 10 G PO PACK: 10.0000 g | PACK | ORAL | 0 refills | Status: AC

## 2024-07-08 MED ORDER — ATORVASTATIN CALCIUM 20 MG PO TABS: 20.0000 mg | ORAL_TABLET | Freq: Every day | ORAL | 3 refills | Status: DC

## 2024-07-08 MED ORDER — PENTAFLUOROPROP-TETRAFLUOROETH EX AERO
1.0000 | INHALATION_SPRAY | CUTANEOUS | Status: DC | PRN
Start: 2024-07-08 — End: 2024-07-08
  Administered 2024-07-08: 1 via TOPICAL
  Filled 2024-07-08: qty 30

## 2024-07-08 MED ORDER — CALCIUM ACETATE 667 MG PO TABS: 1.0000 | ORAL_TABLET | Freq: Three times a day (TID) | ORAL | 2 refills | Status: DC

## 2024-07-08 MED ORDER — TRAZODONE HCL 50 MG PO TABS: 50.0000 mg | ORAL_TABLET | Freq: Every evening | ORAL | 5 refills | Status: AC | PRN

## 2024-07-08 MED ORDER — ASPIRIN 81 MG PO TBEC
81.0000 mg | DELAYED_RELEASE_TABLET | Freq: Every day | ORAL | 11 refills | Status: AC
Start: 2024-07-08 — End: ?

## 2024-07-08 MED ORDER — CALCITRIOL 0.25 MCG PO CAPS: 0.2500 ug | ORAL_CAPSULE | Freq: Every day | ORAL | 5 refills | Status: AC

## 2024-07-08 NOTE — Evaluation (Signed)
 Physical Therapy Evaluation Patient Details Name: Curtis Hartman MRN: [id number] DOB: 09/07/1961 Today's Date: 07/08/2024  History of Present Illness  Curtis Hartman is a 63 y.o. male with medical history significant   for hypertension, type 2 diabetes mellitus, COPD, ESRD on hemodialysis, recent CVA, and chronic pain who is brought in by EMS for increasing lethargy after missing hemodialysis sessions  - Apparently he missed HD on 07/04/2024  - History obtained from ED staff patient Sister as patient is confused and incoherent  - Report of fall x 2 within the last 24 hours  - On admission BP was 230/110  Last HD session was 07/01/2024  -- No fevers reported, no emesis reported,  -- Family reports that patient may have taken extra doses of his trazodone    Clinical Impression  Patient demonstrates slow labored movement for sitting up at bedside, fair/good return for transferring to/from chair and able to ambulate in room with slow labored movement without loss of balance using RW. Patient transferred back to bed and taken to dialysis by nursing staff after therapy. Patient will benefit from continued skilled physical therapy in hospital and recommended venue below to increase strength, balance, endurance for safe ADLs and gait.          If plan is discharge home, recommend the following: A little help with walking and/or transfers;A little help with bathing/dressing/bathroom;Help with stairs or ramp for entrance;Assistance with cooking/housework   Can travel by private vehicle        Equipment Recommendations None recommended by PT  Recommendations for Other Services       Functional Status Assessment Patient has had a recent decline in their functional status and demonstrates the ability to make significant improvements in function in a reasonable and predictable amount of time.     Precautions / Restrictions Precautions Precautions: Fall Recall of Precautions/Restrictions:  Impaired Restrictions Weight Bearing Restrictions Per Provider Order: No      Mobility  Bed Mobility Overal bed mobility: Needs Assistance Bed Mobility: Supine to Sit     Supine to sit: Min assist     General bed mobility comments: increased time, labored movement    Transfers Overall transfer level: Needs assistance Equipment used: Rolling walker (2 wheels) Transfers: Sit to/from Stand, Bed to chair/wheelchair/BSC Sit to Stand: Contact guard assist, Min assist   Step pivot transfers: Contact guard assist, Min assist       General transfer comment: slow labored movement    Ambulation/Gait Ambulation/Gait assistance: Contact guard assist, Min assist Gait Distance (Feet): 22 Feet Assistive device: Rolling walker (2 wheels) Gait Pattern/deviations: Decreased step length - right, Decreased step length - left, Decreased stride length Gait velocity: decreased     General Gait Details: slow labored slightly unstead movement without loss of balance, limited mostly due to fatigue  Stairs            Wheelchair Mobility     Tilt Bed    Modified Rankin (Stroke Patients Only)       Balance Overall balance assessment: Needs assistance Sitting-balance support: Feet supported, No upper extremity supported Sitting balance-Leahy Scale: Fair Sitting balance - Comments: fair/good seated at EOB   Standing balance support: Reliant on assistive device for balance, During functional activity, Bilateral upper extremity supported Standing balance-Leahy Scale: Poor Standing balance comment: fair/poor using RW  Pertinent Vitals/Pain Pain Assessment Pain Assessment: Faces Faces Pain Scale: Hurts little more Pain Location: right flank Pain Descriptors / Indicators: Sore, Discomfort Pain Intervention(s): Limited activity within patient's tolerance, Monitored during session, Repositioned    Home Living Family/patient expects to be  discharged to:: Private residence Living Arrangements: Other relatives (sister) Available Help at Discharge: Family;Available 24 hours/day Type of Home: House Home Access: Stairs to enter   [employer] of Steps: 2   Home Layout: One level Home Equipment: Rollator (4 wheels);BSC/3in1;Adaptive equipment;Shower seat;Tub bench Additional Comments: Reports no change in home set up since last admission    Prior Function Prior Level of Function : Independent/Modified Independent;History of Falls (last six months)             Mobility Comments: household ambulation using rollator most of time, occasional leaning on walls ADLs Comments: Independent for ADLs; some assist IADL's     Extremity/Trunk Assessment   Upper Extremity Assessment Upper Extremity Assessment: Generalized weakness    Lower Extremity Assessment Lower Extremity Assessment: Generalized weakness    Cervical / Trunk Assessment Cervical / Trunk Assessment: Normal  Communication   Communication Communication: No apparent difficulties    Cognition Arousal: Alert Behavior During Therapy: Anxious                           PT - Cognition Comments: slightly confused, has sitter in room) Following commands: Intact       Cueing Cueing Techniques: Verbal cues, Tactile cues     General Comments      Exercises     Assessment/Plan    PT Assessment Patient needs continued PT services  PT Problem List Decreased strength;Decreased balance;Decreased activity tolerance;Decreased mobility       PT Treatment Interventions DME instruction;Gait training;Stair training;Functional mobility training;Therapeutic activities;Therapeutic exercise;Balance training;Patient/family education    PT Goals (Current goals can be found in the Care Plan section)  Acute Rehab PT Goals Patient Stated Goal: return home with family to assist PT Goal Formulation: With patient Time For Goal Achievement:  07/12/24 Potential to Achieve Goals: Good    Frequency Min 3X/week     Co-evaluation               AM-PAC PT 6 Clicks Mobility  Outcome Measure Help needed turning from your back to your side while in a flat bed without using bedrails?: A Little Help needed moving from lying on your back to sitting on the side of a flat bed without using bedrails?: A Little Help needed moving to and from a bed to a chair (including a wheelchair)?: A Little Help needed standing up from a chair using your arms (e.g., wheelchair or bedside chair)?: A Little Help needed to walk in hospital room?: A Little Help needed climbing 3-5 steps with a railing? : A Lot 6 Click Score: 17    End of Session   Activity Tolerance: Patient tolerated treatment well Patient left: in bed;with call bell/phone within reach Nurse Communication: Mobility status PT Visit Diagnosis: Unsteadiness on feet (R26.81);Other abnormalities of gait and mobility (R26.89);Muscle weakness (generalized) (M62.81)    Time: 9077-9052 PT Time Calculation (min) (ACUTE ONLY): 25 min   Charges:   PT Evaluation $PT Eval Moderate Complexity: 1 Mod PT Treatments $Therapeutic Activity: 23-37 mins PT General Charges $$ ACUTE PT VISIT: 1 Visit         12:27 PM, 07/08/24 Lynwood Music, MPT Physical Therapist with [facility] [phone]  office [phone] mobile phone

## 2024-07-08 NOTE — Plan of Care (Signed)
  Problem: Acute Rehab PT Goals(only PT should resolve) Goal: Pt Will Go Supine/Side To Sit Outcome: Progressing Flowsheets (Taken 07/08/2024 1228) Pt will go Supine/Side to Sit:  with supervision  with contact guard assist Goal: Patient Will Transfer Sit To/From Stand Outcome: Progressing Flowsheets (Taken 07/08/2024 1228) Patient will transfer sit to/from stand: with supervision Goal: Pt Will Transfer Bed To Chair/Chair To Bed Outcome: Progressing Flowsheets (Taken 07/08/2024 1228) Pt will Transfer Bed to Chair/Chair to Bed: with supervision Goal: Pt Will Ambulate Outcome: Progressing Flowsheets (Taken 07/08/2024 1228) Pt will Ambulate:  50 feet  with supervision  with contact guard assist  with rolling walker   12:28 PM, 07/08/24 Lynwood Music, MPT Physical Therapist with [facility] [phone] office [phone] mobile phone

## 2024-07-08 NOTE — Progress Notes (Signed)
 Spoke with Nena pts sister about DC who stated she is uncomfortable with DC due to noncompliance and pts mental status. Relayed this information to MD Courage and social worker Nena to beside to assess pt. Nena Child psychotherapist stated she spoke with pts sister Nena who will now be coming to pick pt up at 5pm. Per MD Courage continue with DC.

## 2024-07-08 NOTE — Progress Notes (Signed)
 1700 MD notified by roni Nian, RN pt hypotensive, holding coreg . New orders received to change Discharge medications, AVS updated.  1730 Pt sister arrived to unit irate, stating pt not stable for DC and she would not be able to take him home in this state. MD Courage, [facility] and security notified and at bedside to discuss concerns. Social worker arrived at bedside as well and concerns addressed, per MD continue with DC at this time. Pt dressed,IV removed, placed in Wheelchair and AVS reviewed with sister, Nena.

## 2024-07-08 NOTE — Discharge Instructions (Signed)
 1)Avoid ibuprofen/Advil/Aleve/Motrin/Goody Powders/Naproxen/BC powders/Meloxicam/Diclofenac /Indomethacin and other Nonsteroidal anti-inflammatory medications as these will make you more likely to bleed and can cause stomach ulcers, can also cause Kidney problems.   2)Please note that there is been several changes to your medications--- trazodone  should be reduced to 50 mg at bedtime as needed for sleep -- Narcotics and gabapentin  should be avoided  3)Very Low-salt diet advised---Less than 2 gm of Sodium per day advised----ok to use Mrs DASH salt substitute instead of Salt  4)Weigh yourself daily, call if you gain more than 3 pounds in 1 day or more than 5 pounds in 1 week as your hemodialysis schedule and duration may need to be adjusted  5)Limit your Fluid  intake to No more than 40 ounces (1.2 Liters) per day

## 2024-07-08 NOTE — Discharge Summary (Signed)
 Curtis Hartman, is a 63 y.o. male  DOB 1961/10/24  MRN [id number].  Admission date:  07/06/2024  Admitting Physician  Rendall Carwin, MD  Discharge Date:  07/08/2024   Primary MD  Gerome Tillman LITTIE, FNP  Recommendations for primary care physician for things to follow:  1)Avoid ibuprofen/Advil/Aleve/Motrin/Goody Powders/Naproxen/BC powders/Meloxicam/Diclofenac /Indomethacin and other Nonsteroidal anti-inflammatory medications as these will make you more likely to bleed and can cause stomach ulcers, can also cause Kidney problems.   2)Please note that there is been several changes to your medications--- trazodone  should be reduced to 50 mg at bedtime as needed for sleep -- Narcotics and gabapentin  should be avoided  3)Very Low-salt diet advised---Less than 2 gm of Sodium per day advised----ok to use Mrs DASH salt substitute instead of Salt  4)Weigh yourself daily, call if you gain more than 3 pounds in 1 day or more than 5 pounds in 1 week as your hemodialysis schedule and duration may need to be adjusted  5)Limit your Fluid  intake to No more than 40 ounces (1.2 Liters) per day  Admission Diagnosis  Hyperkalemia [E87.5] 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, 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] ESRD (end stage renal disease) on dialysis (HCC) [N18.6, Z99.2] Hypertensive urgency [I16.0] Acute metabolic encephalopathy [G93.41]  Discharge Diagnosis  Hyperkalemia [E87.5] 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, 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] ESRD (end stage renal disease) on dialysis (HCC) [N18.6, Z99.2] Hypertensive urgency [I16.0] Acute metabolic encephalopathy [G93.41]    Principal Problem:   ESRD (end stage renal disease) on dialysis [facility]) Active Problems:   Acute encephalopathy   End-stage renal disease on hemodialysis (HCC)   Hypertension   T2DM (type 2 diabetes mellitus) (HCC)   Chronic obstructive pulmonary disease (COPD) (HCC)   Uncomplicated opioid dependence (HCC)    Acute metabolic encephalopathy      Past Medical History:  Diagnosis Date   Arthritis    CKD (chronic kidney disease)    COPD (chronic obstructive pulmonary disease) (HCC)    Diabetes mellitus without complication (HCC)    type 2   GERD (gastroesophageal reflux disease)    Headache    Hx Migraines   Hepatitis    Hypertension    Neuropathy     Past Surgical History:  Procedure Laterality Date   ANTERIOR CERVICAL DECOMP/DISCECTOMY FUSION N/A 11/05/2022   Procedure: Anterior Cervical Decompression/discectomy Fusion Cervical Four-Cervical Five;  Surgeon: Debby Dorn MATSU, MD;  Location: [facility] OR;  Service: Neurosurgery;  Laterality: N/A;  3C   ANTERIOR LAT LUMBAR FUSION N/A 09/01/2023   Procedure: DIRECT LUMBAR INTERBODY FUSION, LUMBAR TWO-LUMBAR THREE, LUMBAR THREE-LUMBAR FOUR, LUMBAR FOUR-LUMBAR FIVE, RIGHT PRONE TRANSPSOAS, LUMBAR TWO LUMBAR FIVE POSTERIOR PERCUTANEOUS FUSION LUMBAR FOUR-LUMBAR FIVE MINIMALLY INVASIVE LAMINECTOMY, FORAMINOTOMY;  Surgeon: Debby Dorn MATSU, MD;  Location: [facility] OR;  Service: Neurosurgery;  Laterality: N/A;   AV FISTULA PLACEMENT Left 09/10/2023   Procedure: LEFT ARM ARTERIOVENOUS (AV) FISTULA CREATION;  Surgeon: Pearline Norman RAMAN, MD;  Location: [facility] OR;  Service: Vascular;  Laterality: Left;   BACK SURGERY  2023   Cervical Spine   BIOPSY  03/03/2022  Procedure: BIOPSY;  Surgeon: Cindie Carlin POUR, DO;  Location: [facility] SUITE;  Service: Endoscopy;;   COLONOSCOPY WITH PROPOFOL  N/A 03/03/2022   Procedure: COLONOSCOPY WITH PROPOFOL ;  Surgeon: Cindie Carlin POUR, DO;  [facility];  Service: Endoscopy;  Laterality: N/A;  8:00am   ESOPHAGOGASTRODUODENOSCOPY (EGD) WITH PROPOFOL  N/A 03/03/2022   Procedure: ESOPHAGOGASTRODUODENOSCOPY (EGD) WITH PROPOFOL ;  Surgeon: Cindie Carlin POUR, DO;  [facility];  Service: Endoscopy;  Laterality: N/A;   foot right partial toe amputation Right    FOOT SURGERY Right    x3   hernia surgery x4     LUMBAR  PERCUTANEOUS PEDICLE SCREW 3 LEVEL N/A 09/01/2023   Procedure: L2-5 LUMBAR PERCUTANEOUS PEDICLE SCREW 3 LEVEL;  Surgeon: Debby Dorn MATSU, MD;  Location: [facility] OR;  Service: Neurosurgery;  Laterality: N/A;   POLYPECTOMY  03/03/2022   Procedure: POLYPECTOMY;  Surgeon: Cindie Carlin POUR, DO;  [facility];  Service: Endoscopy;;    HPI  from the history and physical done on the day of admission:   HPI: Curtis Hartman is a 63 y.o. male with medical history significant   for hypertension, type 2 diabetes mellitus, COPD, ESRD on hemodialysis, recent CVA, and chronic pain who is brought in by EMS for increasing lethargy after missing hemodialysis sessions - Apparently he missed HD on 07/04/2024 - History obtained from ED staff patient Sister as patient is confused and incoherent - Report of fall x 2 within the last 24 hours - On admission BP was 230/110 Last HD session was 07/01/2024 -- No fevers reported, no emesis reported, -- Family reports that patient may have taken extra doses of his trazodone    WBC 8.2 hemoglobin 11.2 platelets 247,  Magnesium  1.9,  - Sodium is 134 potassium up to 5.5 bicarb 21 chloride 97 creatinine 6.96, BUN 57 LFTs are not elevated anion gap 16 - CT head without contrast and C-spine without acute findings -Chest x-ray without acute findings   Review of Systems: unable to review all systems due to the inability of the patient to answer questions.    Hospital Course:     Brief Summary -63 y.o. male with medical history significant   for hypertension, type 2 diabetes mellitus, COPD, ESRD on hemodialysis, recent CVA, and chronic pain admitted on 07/06/2024 with acute metabolic encephalopathy after missed hemodialysis and taking extra doses of trazodone    Assessment and Plan: 1)Acute Metabolic Encephalopathy-with recurrent falls--suspect due to uremia after missed hemodialysis sessions compounded by extra doses of trazodone  -Last HD session prior to admission  was  07/01/2024 --CT head without contrast CT C-spine without acute findings -Anticipate the patient will improve with hemodialysis sessions - Concerns about medication errors and extra trazodone  dosage- -patient had incomplete hemodialysis session on 07/06/2024 twice daily  --Sadly trazodone  apparently does not get dialyzed -Mentation is completely back to baseline  2)Hyperkalemia--- in the setting of missed hemodialysis sessions -Use HD to address hyperkalemia -Okay to take Lokelma  on nonhemodialysis days -Normalized after HD   3)ESRD--- MWF schedule -Nephrology consult appreciated -Last HD session prior to admission was  07/01/2024 -Patient had incomplete hemodialysis on 07/06/2024 -Patient had complete HD session on 07/09/2023 -- 4)H/o CVA--- please see MRI brain from 05/06/2024 -- Continue aspirin  81 mg daily and Lipitor 20 mg daily for secondary stroke prophylaxis Even if his lipid panel is within desired limits, patient should still take Lipitor/Statin for it's Pleiotropic effects (beyond cholesterol lowering benefits)  5)Uncontrolled HTN--On admission BP was 230/110 -  Suspect some degree of hypertensive urgency -- BP improved with hemodialysis - Okay to discharge home on Amlodipine  and Coreg     6. Anemia of ESRD  - Hgb stable, >> 10   - No overt bleeding   7. Chronic pain  - He was prescribed 50 tabs of morphine  sulfate IR 30 mg on 05/27/24  - Morphine  should be avoided in ESRD patients, particularly given his recurrent presentations with encephalopathy   - -Avoid opiates if possible -Unclear if patient is still getting gabapentin , if so he should limit gabapentin  to 300 mg nightly    8)COPD  - Not in exacerbation   - Continue ICS-LAMA-LABA and as-needed SABA    9)Social/Ethics----Left Voicemail at [phone] for Sister  Sandra Redler  (who pt lives with pt). -spoke with Odai Wimmer at [phone]    10)Type II DM  -05/06/24 A1C--4.9---reflecting excellent  diabetic control PTA -previously on Lantus  back in 2022, but Now diet controlled only - 11)Generalized weakness and Deconditioning----physical therapy eval appreciated recommends home health PT   Disposition: The patient is from: Home              Anticipated d/c is to: Home  Discharge Condition: stable  Follow UP   Follow-up Information     Gerome Tillman CROME, FNP. Schedule an appointment as soon as possible for a visit.   Specialty: Family Medicine Contact information: [address] [place] [address] [phone]                 Consults obtained -nephrology  Diet and Activity recommendation:  As advised  Discharge Instructions    Discharge Instructions     Call MD for:  difficulty breathing, headache or visual disturbances   Complete by: As directed    Call MD for:  persistant dizziness or light-headedness   Complete by: As directed    Call MD for:  persistant nausea and vomiting   Complete by: As directed    Call MD for:  redness, tenderness, or signs of infection (pain, swelling, redness, odor or green/yellow discharge around incision site)   Complete by: As directed    Call MD for:  temperature >100.4   Complete by: As directed    Diet - low sodium heart healthy   Complete by: As directed    Discharge instructions   Complete by: As directed    1)Avoid ibuprofen/Advil/Aleve/Motrin/Goody Powders/Naproxen/BC powders/Meloxicam/Diclofenac /Indomethacin and other Nonsteroidal anti-inflammatory medications as these will make you more likely to bleed and can cause stomach ulcers, can also cause Kidney problems.   2)Please note that there is been several changes to your medications--- trazodone  should be reduced to 50 mg at bedtime as needed for sleep -- Narcotics and gabapentin  should be avoided  3)Very Low-salt diet advised---Less than 2 gm of Sodium per day advised----ok to use Mrs DASH salt substitute instead of Salt  4)Weigh yourself daily, call if you gain  more than 3 pounds in 1 day or more than 5 pounds in 1 week as your hemodialysis schedule and duration may need to be adjusted  5)Limit your Fluid  intake to No more than 40 ounces (1.2 Liters) per day   Discharge wound care:   Complete by: As directed    Keep abrasions clean and dry   Increase activity slowly   Complete by: As directed          Discharge Medications     Allergies as of 07/08/2024   No Known Allergies  Medication List     TAKE these medications    acetaminophen  325 MG tablet Commonly known as: TYLENOL  Take 2 tablets (650 mg total) by mouth every 6 (six) hours as needed for mild pain (pain score 1-3) or fever (or Fever >/= 101).   amLODipine  5 MG tablet Commonly known as: NORVASC  Take 1 tablet (5 mg total) by mouth daily. Hold if SBP < 100 mmhg What changed:  medication strength how much to take when to take this reasons to take this additional instructions   aspirin  EC 81 MG tablet Take 1 tablet (81 mg total) by mouth daily with breakfast.   atorvastatin  20 MG tablet Commonly known as: Lipitor Take 1 tablet (20 mg total) by mouth daily.   calcitRIOL  0.25 MCG capsule Commonly known as: ROCALTROL  Take 1 capsule (0.25 mcg total) by mouth daily.   Calcium  Acetate 667 MG Tabs Take 1 tablet by mouth with breakfast, with lunch, and with evening meal.   carvedilol  6.25 MG tablet Commonly known as: COREG  Take 1 tablet (6.25 mg total) by mouth 2 (two) times daily with a meal.   diphenoxylate -atropine  2.5-0.025 MG tablet Commonly known as: LOMOTIL  Take 1 tablet by mouth 4 (four) times daily as needed for diarrhea or loose stools.   fluticasone  50 MCG/ACT nasal spray Commonly known as: FLONASE  Place 2 sprays into both nostrils daily.   Lokelma  10 g Pack packet Generic drug: sodium zirconium cyclosilicate  Take 10 g by mouth every Tuesday, Thursday, Saturday, and Sunday. Start taking on: July 09, 2024 What changed: when to take this    methocarbamol  750 MG tablet Commonly known as: ROBAXIN  Take 750 mg by mouth 3 (three) times daily.   oxybutynin  10 MG 24 hr tablet Commonly known as: DITROPAN -XL Take 10 mg by mouth daily.   oxyCODONE  5 MG immediate release tablet Commonly known as: Oxy IR/ROXICODONE  Take 1 tablet (5 mg total) by mouth every 6 (six) hours as needed for severe pain (pain score 7-10).   traZODone  50 MG tablet Commonly known as: DESYREL  Take 1 tablet (50 mg total) by mouth at bedtime as needed for sleep. What changed:  medication strength how much to take reasons to take this               Discharge Care Instructions  (From admission, onward)           Start     Ordered   07/08/24 0000  Discharge wound care:       Comments: Keep abrasions clean and dry   07/08/24 1144           Major procedures and Radiology Reports - PLEASE review detailed and final reports for all details, in brief -   CT Cervical Spine Wo Contrast Result Date: 07/06/2024 EXAM: CT HEAD AND CERVICAL SPINE 07/06/2024 12:37:54 PM TECHNIQUE: CT of the head and cervical spine was performed without the administration of intravenous contrast. Multiplanar reformatted images are provided for review. Automated exposure control, iterative reconstruction, and/or weight based adjustment of the mA/kV was utilized to reduce the radiation dose to as low as reasonably achievable. COMPARISON: None available. CLINICAL HISTORY: Polytrauma, blunt. Pt BIB [facility] for hypertension and reported pt missed Monday's and today's hemodialysis. Reported that pt's sister stated he was hard to arouse this morning and did not go to dialysis. Pt states he did not know why he didn't go Monday. Reported that pt fell x 2 last night. Pt points to his left chest when asked about  pain. EMS reported BP 230/110 and CBG 141. FINDINGS: CT HEAD BRAIN AND VENTRICLES: No acute intracranial hemorrhage. No mass effect or midline shift. No abnormal extra-axial fluid  collection. No evidence of acute infarct. No hydrocephalus. Nonspecific hypoattenuation in the periventricular and subcortical white matter, most likely representing chronic small vessel disease. Mild parenchymal volume loss. ORBITS: No acute abnormality. SINUSES AND MASTOIDS: No acute abnormality. SOFT TISSUES AND SKULL: No acute skull fracture. No acute soft tissue abnormality. CT CERVICAL SPINE BONES AND ALIGNMENT: No acute fracture or traumatic malalignment. Straightening of the normal cervical lordosis. DEGENERATIVE CHANGES: No high grade osseous spinal canal stenosis. Facet arthrosis and uncovertebral hypertrophy at multiple levels. Anterior cervical fusion hardware and interbody spacer at C4-5. Sequelae of laminectomy at C5 to C7 with posterior instrumented fusion spanning C4 to T1. Hardware is intact. SOFT TISSUES: No prevertebral soft tissue swelling. IMPRESSION: 1. No acute intracranial abnormality. 2. No acute fracture or traumatic malalignment of the cervical spine. Electronically signed by: Donnice Mania MD 07/06/2024 01:19 PM EDT RP Workstation: [id number]   CT Head Wo Contrast Result Date: 07/06/2024 EXAM: CT HEAD AND CERVICAL SPINE 07/06/2024 12:37:54 PM TECHNIQUE: CT of the head and cervical spine was performed without the administration of intravenous contrast. Multiplanar reformatted images are provided for review. Automated exposure control, iterative reconstruction, and/or weight based adjustment of the mA/kV was utilized to reduce the radiation dose to as low as reasonably achievable. COMPARISON: None available. CLINICAL HISTORY: Polytrauma, blunt. Pt BIB [facility] for hypertension and reported pt missed Monday's and today's hemodialysis. Reported that pt's sister stated he was hard to arouse this morning and did not go to dialysis. Pt states he did not know why he didn't go Monday. Reported that pt fell x 2 last night. Pt points to his left chest when asked about pain. EMS reported BP 230/110 and  CBG 141. FINDINGS: CT HEAD BRAIN AND VENTRICLES: No acute intracranial hemorrhage. No mass effect or midline shift. No abnormal extra-axial fluid collection. No evidence of acute infarct. No hydrocephalus. Nonspecific hypoattenuation in the periventricular and subcortical white matter, most likely representing chronic small vessel disease. Mild parenchymal volume loss. ORBITS: No acute abnormality. SINUSES AND MASTOIDS: No acute abnormality. SOFT TISSUES AND SKULL: No acute skull fracture. No acute soft tissue abnormality. CT CERVICAL SPINE BONES AND ALIGNMENT: No acute fracture or traumatic malalignment. Straightening of the normal cervical lordosis. DEGENERATIVE CHANGES: No high grade osseous spinal canal stenosis. Facet arthrosis and uncovertebral hypertrophy at multiple levels. Anterior cervical fusion hardware and interbody spacer at C4-5. Sequelae of laminectomy at C5 to C7 with posterior instrumented fusion spanning C4 to T1. Hardware is intact. SOFT TISSUES: No prevertebral soft tissue swelling. IMPRESSION: 1. No acute intracranial abnormality. 2. No acute fracture or traumatic malalignment of the cervical spine. Electronically signed by: Donnice Mania MD 07/06/2024 01:19 PM EDT RP Workstation: [id number]   DG Chest 2 View Result Date: 07/06/2024 CLINICAL DATA:  Weakness.  Fall. EXAM: CHEST - 2 VIEW COMPARISON:  05/09/2024. FINDINGS: The heart size and mediastinal contours are within normal limits. No focal consolidation, pleural effusion, or pneumothorax. Partially visualized lower cervical fusion. No acute osseous abnormality identified. IMPRESSION: No acute cardiopulmonary findings. Electronically Signed   By: Harrietta Sherry M.D.   On: 07/06/2024 12:53   Micro Results   Recent Results (from the past 240 hours)  MRSA Next Gen by PCR, Nasal     Status: Abnormal   Collection Time: 07/06/24  7:05 PM   Specimen: Nasal Mucosa; Nasal  Swab  Result Value Ref Range Status   MRSA by PCR Next Gen DETECTED  (A) NOT DETECTED Final    Comment: RESULT CALLED TO, READ BACK BY AND VERIFIED WITH: R. ESTOCE [id number], VIRAY,J (NOTE) The GeneXpert MRSA Assay (FDA approved for NASAL specimens only), is one component of a comprehensive MRSA colonization surveillance program. It is not intended to diagnose MRSA infection nor to guide or monitor treatment for MRSA infections. Test performance is not FDA approved in patients less than 73 years old. Performed at [facility], [address], [place], [place] [address]    Today   Subjective    Khristian Phillippi today has no new complaints His Niece is at bedside---mentation back to baseline -Patient is coherent and appropriate -Eating and drinking well -Participated in physical therapy session - No fever  Or chills        No Nausea, Vomiting or Diarrhea   Patient has been seen and examined prior to discharge   Objective   Blood pressure 95/61, pulse 63, temperature 97.7 F (36.5 C), temperature source Oral, resp. rate 15, height 6' (1.829 m), weight 60.1 kg, SpO2 100%.   Intake/Output Summary (Last 24 hours) at 07/08/2024 1158 Last data filed at 07/08/2024 0900 Gross per 24 hour  Intake 240 ml  Output 450 ml  Net -210 ml   Exam Gen:- Awake Alert, no acute distress, frail and cachectic HEENT:- [place].AT, No sclera icterus Neck-Supple Neck,No JVD,.  Lungs-  CTAB , good air movement bilaterally CV- S1, S2 normal, regular Abd-  +ve B.Sounds, Abd Soft, No tenderness,    Extremity/Skin:- No  edema,   good pulses, right anterior thigh and shin area with hemostatic abrasions Psych-affect is appropriate, oriented x3, mentation is back to baseline,  Neuro- No new focal deficits, no tremors , moving all extremities  MSK--left upper extremity AVF with thrill and bruit    Data Review   CBC w Diff:  Lab Results  Component Value Date   WBC 7.5 07/08/2024   HGB 10.0 (L) 07/08/2024   HCT 30.7 (L) 07/08/2024   PLT 267 07/08/2024   LYMPHOPCT 12  07/06/2024   MONOPCT 5 07/06/2024   EOSPCT 1 07/06/2024   BASOPCT 1 07/06/2024   CMP:  Lab Results  Component Value Date   NA 139 07/08/2024   K 4.5 07/08/2024   CL 99 07/08/2024   CO2 26 07/08/2024   BUN 51 (H) 07/08/2024   CREATININE 6.51 (H) 07/08/2024   CREATININE 2.78 (H) 11/27/2022   PROT 6.4 (L) 07/07/2024   ALBUMIN  3.1 (L) 07/08/2024   BILITOT 1.2 07/07/2024   ALKPHOS 61 07/07/2024   AST 22 07/07/2024   ALT 7 07/07/2024   Total Discharge time is about 33 minutes  Rendall Carwin M.D on 07/08/2024 at 11:58 AM  Go to [email or web address] -  for contact info  [facility] - Office  [phone]

## 2024-07-08 NOTE — Procedures (Signed)
 Received patient in bed to unit.  Alert and oriented.  Informed consent signed and in chart.  LAU AVF cannulated x 2 with 15g needles per policy. Secured well with tape. Tx initiated per MD order. Lines secured. Pt tolerated well. Pt had asymptomatic hypotension, UF goal off, 100 ml Nsgiven. Responded well to interventions. B/P stable, UF on. TX duration: 3.5  Tx complete. Blood returned. Needles removed, sites held x 2 until hemostasis achieved. No bleeding or distress noted at transfer  Transported back to the room  Alert, without acute distress.  Hand-off given to patient's nurse.   Access used: LUA AVF Access issues: none  Total UF removed: 1400 ml Medication(s) given: See MAR    Powell LITTIE [facility]

## 2024-07-08 NOTE — Progress Notes (Signed)
 D/c orders noted. Contacted [facility]-pt HD ([facility]) to inform of pt d/c and anticipated arrival back to clinic Monday. AVS updated with this appt info as a reminder to pt. Faxed over d/c summary and last note at this time, no further support needed.   Jo[facility] [phone]

## 2024-07-08 NOTE — TOC Transition Note (Signed)
 Transition of Care [facility]) - Discharge Note   Patient Details  Name: Curtis Hartman MRN: [id number] Date of Birth: 1961-02-20  [facility] CM/SW Contact:  Nena LITTIE Coffee, RN Phone Number: 07/08/2024, 11:58 AM   Clinical Narrative:  PT has recommended HHPT but no agency currently accepting [place] Medicaid. Spoke c/pt's sister, Particia, about her expectations r/t to pt's care, history of non-compliance and offered a home palliative/hospice referral which she refused.  Particia is interested in finding a men's home that offers a little more structure than being at home. Instructed Sandy to contact pt's case manager at [employer] for guidance on available long-term resources.   Final next level of care: Other (comment) (Pt lives c/his sister and her boyfriend.) Barriers to Discharge: Barriers Resolved, No Home Care Agency will accept this patient (No HH agency will accept pt's insurance.)   Patient Goals and CMS Choice            Discharge Placement                       Discharge Plan and Services Additional resources added to the After Visit Summary for                                       Social Drivers of Health (SDOH) Interventions SDOH Screenings   Food Insecurity: Patient Unable To Answer (07/07/2024)  Housing: Low Risk  (06/02/2024)  Transportation Needs: No Transportation Needs (06/02/2024)  Utilities: Not At Risk (06/02/2024)  Financial Resource Strain: High Risk (01/15/2024)   Received from [facility]  Physical Activity: Unknown (01/15/2024)   Received from [facility]  Social Connections: Socially Isolated (06/02/2024)  Stress: Stress Concern Present (02/29/2024)   Received from [facility]  Tobacco Use: High Risk (07/06/2024)     Readmission Risk Interventions    07/08/2024   11:53 AM 06/04/2024    9:37 AM 06/03/2024    2:25 PM  Readmission Risk Prevention Plan  Transportation Screening Complete Complete Complete  Medication Review Furniture conservator/restorer) Complete Complete Complete  PCP or Specialist appointment within 3-5 days of discharge Complete    HRI or Home Care Consult Complete Complete Complete  SW Recovery Care/Counseling Consult Complete Complete Complete  Palliative Care Screening Patient Refused Not Applicable Not Applicable  Skilled Nursing Facility Not Applicable Complete Complete

## 2024-07-08 NOTE — Progress Notes (Signed)
 Spoke with MD Courage at length about concerns for DC. Pt intermittently confused, frequent falls at home, noncompliant with dialysis and multiple admissions, family at bedside states uncomfortable with taking care of pt at home. TOC consult in place. At this time MD states pt is at baseline, AOX4.

## 2024-07-08 NOTE — Progress Notes (Signed)
 Patient ID: Curtis Hartman, male   DOB: 02/19/1961, 63 y.o.   MRN: [id number] S: Much more awake and alert this morning.  Doesn't know what happened prior to admission.  Niece is in the room and reports that he is taking high dose trazadone at home as well as gabapentin , however they do not know the dosage of gabapentin .  He denies any pain medications at home. O:BP 132/73   Pulse 69   Temp 97.7 F (36.5 C) (Axillary)   Resp 12   Ht 6' (1.829 m)   Wt 60.6 kg   SpO2 100%   BMI 18.12 kg/m   Intake/Output Summary (Last 24 hours) at 07/08/2024 0921 Last data filed at 07/08/2024 0318 Gross per 24 hour  Intake --  Output 450 ml  Net -450 ml   Intake/Output: I/O last 3 completed shifts: In: 120 [P.O.:120] Out: 450 [Urine:450]  Intake/Output this shift:  No intake/output data recorded. Weight change:  Gen: NAD CVS: RRR Resp:CTA Abd: +BS< soft, NT/ND Ext: no edema, LUE AVF +T/B  Recent Labs  Lab 07/06/24 1241 07/07/24 0508 07/08/24 0338  NA 134* 136 139  K 5.5* 4.2 4.5  CL 97* 95* 99  CO2 21* 25 26  GLUCOSE 121* 119* 102*  BUN 57* 40* 51*  CREATININE 6.96* 5.25* 6.51*  ALBUMIN  3.3* 3.2* 3.1*  CALCIUM  9.2 8.9 9.1  PHOS  --   --  7.3*  AST 19 22  --   ALT 7 7  --    Liver Function Tests: Recent Labs  Lab 07/06/24 1241 07/07/24 0508 07/08/24 0338  AST 19 22  --   ALT 7 7  --   ALKPHOS 66 61  --   BILITOT 1.1 1.2  --   PROT 6.7 6.4*  --   ALBUMIN  3.3* 3.2* 3.1*   No results for input(s): LIPASE, AMYLASE in the last 168 hours. No results for input(s): AMMONIA in the last 168 hours. CBC: Recent Labs  Lab 07/06/24 1241 07/07/24 0508 07/08/24 0501  WBC 8.2 7.2 7.5  NEUTROABS 6.7  --   --   HGB 11.2* 10.7* 10.0*  HCT 34.5* 32.3* 30.7*  MCV 90.3 89.7 92.5  PLT 247 236 267   Cardiac Enzymes: No results for input(s): CKTOTAL, CKMB, CKMBINDEX, TROPONINI in the last 168 hours. CBG: Recent Labs  Lab 07/07/24 0735 07/07/24 1138 07/07/24 1626  07/07/24 2129 07/08/24 0733  GLUCAP 109* 136* 109* 111* 99    Iron  Studies: No results for input(s): IRON , TIBC, TRANSFERRIN, FERRITIN in the last 72 hours. Studies/Results: CT Cervical Spine Wo Contrast Result Date: 07/06/2024 EXAM: CT HEAD AND CERVICAL SPINE 07/06/2024 12:37:54 PM TECHNIQUE: CT of the head and cervical spine was performed without the administration of intravenous contrast. Multiplanar reformatted images are provided for review. Automated exposure control, iterative reconstruction, and/or weight based adjustment of the mA/kV was utilized to reduce the radiation dose to as low as reasonably achievable. COMPARISON: None available. CLINICAL HISTORY: Polytrauma, blunt. Pt BIB [facility] for hypertension and reported pt missed Monday's and today's hemodialysis. Reported that pt's sister stated he was hard to arouse this morning and did not go to dialysis. Pt states he did not know why he didn't go Monday. Reported that pt fell x 2 last night. Pt points to his left chest when asked about pain. EMS reported BP 230/110 and CBG 141. FINDINGS: CT HEAD BRAIN AND VENTRICLES: No acute intracranial hemorrhage. No mass effect or midline shift. No abnormal extra-axial fluid  collection. No evidence of acute infarct. No hydrocephalus. Nonspecific hypoattenuation in the periventricular and subcortical white matter, most likely representing chronic small vessel disease. Mild parenchymal volume loss. ORBITS: No acute abnormality. SINUSES AND MASTOIDS: No acute abnormality. SOFT TISSUES AND SKULL: No acute skull fracture. No acute soft tissue abnormality. CT CERVICAL SPINE BONES AND ALIGNMENT: No acute fracture or traumatic malalignment. Straightening of the normal cervical lordosis. DEGENERATIVE CHANGES: No high grade osseous spinal canal stenosis. Facet arthrosis and uncovertebral hypertrophy at multiple levels. Anterior cervical fusion hardware and interbody spacer at C4-5. Sequelae of laminectomy at C5 to  C7 with posterior instrumented fusion spanning C4 to T1. Hardware is intact. SOFT TISSUES: No prevertebral soft tissue swelling. IMPRESSION: 1. No acute intracranial abnormality. 2. No acute fracture or traumatic malalignment of the cervical spine. Electronically signed by: Donnice Mania MD 07/06/2024 01:19 PM EDT RP Workstation: [id number]   CT Head Wo Contrast Result Date: 07/06/2024 EXAM: CT HEAD AND CERVICAL SPINE 07/06/2024 12:37:54 PM TECHNIQUE: CT of the head and cervical spine was performed without the administration of intravenous contrast. Multiplanar reformatted images are provided for review. Automated exposure control, iterative reconstruction, and/or weight based adjustment of the mA/kV was utilized to reduce the radiation dose to as low as reasonably achievable. COMPARISON: None available. CLINICAL HISTORY: Polytrauma, blunt. Pt BIB [facility] for hypertension and reported pt missed Monday's and today's hemodialysis. Reported that pt's sister stated he was hard to arouse this morning and did not go to dialysis. Pt states he did not know why he didn't go Monday. Reported that pt fell x 2 last night. Pt points to his left chest when asked about pain. EMS reported BP 230/110 and CBG 141. FINDINGS: CT HEAD BRAIN AND VENTRICLES: No acute intracranial hemorrhage. No mass effect or midline shift. No abnormal extra-axial fluid collection. No evidence of acute infarct. No hydrocephalus. Nonspecific hypoattenuation in the periventricular and subcortical white matter, most likely representing chronic small vessel disease. Mild parenchymal volume loss. ORBITS: No acute abnormality. SINUSES AND MASTOIDS: No acute abnormality. SOFT TISSUES AND SKULL: No acute skull fracture. No acute soft tissue abnormality. CT CERVICAL SPINE BONES AND ALIGNMENT: No acute fracture or traumatic malalignment. Straightening of the normal cervical lordosis. DEGENERATIVE CHANGES: No high grade osseous spinal canal stenosis. Facet arthrosis  and uncovertebral hypertrophy at multiple levels. Anterior cervical fusion hardware and interbody spacer at C4-5. Sequelae of laminectomy at C5 to C7 with posterior instrumented fusion spanning C4 to T1. Hardware is intact. SOFT TISSUES: No prevertebral soft tissue swelling. IMPRESSION: 1. No acute intracranial abnormality. 2. No acute fracture or traumatic malalignment of the cervical spine. Electronically signed by: Donnice Mania MD 07/06/2024 01:19 PM EDT RP Workstation: [id number]   DG Chest 2 View Result Date: 07/06/2024 CLINICAL DATA:  Weakness.  Fall. EXAM: CHEST - 2 VIEW COMPARISON:  05/09/2024. FINDINGS: The heart size and mediastinal contours are within normal limits. No focal consolidation, pleural effusion, or pneumothorax. Partially visualized lower cervical fusion. No acute osseous abnormality identified. IMPRESSION: No acute cardiopulmonary findings. Electronically Signed   By: Harrietta Sherry M.D.   On: 07/06/2024 12:53    amLODipine   10 mg Oral Daily   calcitRIOL   0.25 mcg Oral Daily   calcium  acetate  667 mg Oral TID with meals   carvedilol   12.5 mg Oral BID WC   Chlorhexidine  Gluconate Cloth  6 each Topical Q0600   fluticasone   2 spray Each Nare Daily   heparin   5,000 Units Subcutaneous Q8H   hydrALAZINE   50 mg Oral TID   insulin  aspart  0-5 Units Subcutaneous QHS   insulin  aspart  0-9 Units Subcutaneous TID WC   isosorbide  mononitrate  30 mg Oral Daily   methocarbamol   750 mg Oral TID   mupirocin  ointment  1 Application Nasal BID   oxybutynin   10 mg Oral Daily   sodium bicarbonate   50 mEq Intravenous Once   sodium chloride  flush  3 mL Intravenous Q12H   sodium chloride  flush  3 mL Intravenous Q12H   sodium zirconium cyclosilicate   10 g Oral TID    BMET    Component Value Date/Time   NA 139 07/08/2024 0338   K 4.5 07/08/2024 0338   CL 99 07/08/2024 0338   CO2 26 07/08/2024 0338   GLUCOSE 102 (H) 07/08/2024 0338   BUN 51 (H) 07/08/2024 0338   CREATININE 6.51 (H)  07/08/2024 0338   CREATININE 2.78 (H) 11/27/2022 1317   CALCIUM  9.1 07/08/2024 0338   GFRNONAA 9 (L) 07/08/2024 0338   CBC    Component Value Date/Time   WBC 7.5 07/08/2024 0501   RBC 3.32 (L) 07/08/2024 0501   HGB 10.0 (L) 07/08/2024 0501   HCT 30.7 (L) 07/08/2024 0501   PLT 267 07/08/2024 0501   MCV 92.5 07/08/2024 0501   MCH 30.1 07/08/2024 0501   MCHC 32.6 07/08/2024 0501   RDW 14.3 07/08/2024 0501   LYMPHSABS 1.0 07/06/2024 1241   MONOABS 0.4 07/06/2024 1241   EOSABS 0.1 07/06/2024 1241   BASOSABS 0.1 07/06/2024 1241    Dialysis Orders:  [facility], MWF, Elisio 17H Time 4:00, edw 62.5kg, LUE AVF BFR 350, DFR 600, 2K/2.5Ca bath Heparin  1000 units bolus then 1000 units/hr Venofer  50 mg IV once a week   Assessment/Plan:  Acute metabolic encephalopathy and falls - has had repeated admissions with similar presentation for the past 3 months although he is more agitated this time.  He previous admissions were with lethargy and weakness.  He has ongoing usage of morphine  which was discontinued at his last discharge and unclear if he is taking more at home.  He also had  multiple ischemic strokes in July.   Agree with admission and further workup.  His mental status did not significantly improve after HD, however is much better today.  His niece reports that he is taking gabapentin  at home but does not know the dose or frequency.  This could cause AMS in ESRD patients.  Need to make sure he is not getting more than 300 mg daily.  She also reports he is taking high doses of trazadone at home, again can cause AMS.  Will need to verify dosage.  Drug screen pending.  May need to repeat MRI to rule out recurrent/worsening CVA since his mentation is better and can cooperate with MRI now.  Continue with current management and follow for recovery.      ESRD -   missed HD on Monday and Wednesday.  Will plan for HD today to remain on his outpatient schedule MWF and monitor his response to  HD. Avoid nephrotoxic medications including NSAIDs and iodinated intravenous contrast exposure unless the latter is absolutely indicated.   Preferred narcotic agents for pain control are hydromorphone , fentanyl , and methadone. Morphine  should not be used.  Avoid Baclofen and avoid oral sodium phosphate  and magnesium  citrate based laxatives / bowel preps.  Continue strict Input and Output monitoring. Will monitor the patient closely with you and intervene or adjust therapy as indicated by changes  in clinical status/labs   Hypertension/volume  - markedly hypertensive but is at his edw.  Will challenge with HD today and follow BP.  Anemia  - Hgb stable, no ESA for now  Metabolic bone disease -   continue with home meds  Nutrition -  renal diet, carb modified.  Recurrent falls - had 2 falls at home per his sister to EMS.  CT of head and neck were unremarkable.  Will need fall precautions as well as PT/OT evaluation.   Fairy RONAL Sellar, MD [facility]  Pt will not be physically seen over the weekend, however he will be monitored remotely.  Oncall coverage will be available if needed.

## 2024-07-13 LAB — OXYCODONES,MS,WB/SP RFX
Oxycocone: NEGATIVE ng/mL
Oxycodones Confirmation: NEGATIVE
Oxymorphone: NEGATIVE ng/mL

## 2024-07-13 LAB — DRUG SCREEN 10 W/CONF, SERUM
Amphetamines, IA: NEGATIVE ng/mL
Barbiturates, IA: NEGATIVE ug/mL
Benzodiazepines, IA: NEGATIVE ng/mL
Cocaine & Metabolite, IA: NEGATIVE ng/mL
Methadone, IA: NEGATIVE ng/mL
Opiates, IA: NEGATIVE ng/mL
Oxycodones, IA: NEGATIVE ng/mL
Phencyclidine, IA: NEGATIVE ng/mL
Propoxyphene, IA: NEGATIVE ng/mL
THC(Marijuana) Metabolite, IA: NEGATIVE ng/mL

## 2024-08-03 ENCOUNTER — Telehealth: Payer: Self-pay

## 2024-08-03 NOTE — Telephone Encounter (Signed)
 Received incoming fax from [facility] regarding pt's monitor ordered by Dr. Mallipeddi. Pt non-compliant with monitor- study canceled.

## 2024-08-09 ENCOUNTER — Encounter (HOSPITAL_COMMUNITY): Payer: Self-pay

## 2024-08-12 ENCOUNTER — Encounter (HOSPITAL_COMMUNITY): Admission: RE | Payer: Self-pay | Source: Home / Self Care

## 2024-08-12 ENCOUNTER — Ambulatory Visit (HOSPITAL_COMMUNITY): Admission: RE | Admit: 2024-08-12 | Source: Home / Self Care | Admitting: Vascular Surgery

## 2024-08-12 SURGERY — A/V FISTULAGRAM
Anesthesia: LOCAL | Site: Arm Upper | Laterality: Left

## 2024-08-18 ENCOUNTER — Encounter (HOSPITAL_COMMUNITY): Payer: Self-pay

## 2024-08-19 ENCOUNTER — Encounter (HOSPITAL_COMMUNITY)
Admission: RE | Disposition: A | Discharge status: Home / Self Care | Payer: Self-pay | Source: Home / Self Care | Attending: Vascular Surgery

## 2024-08-19 ENCOUNTER — Other Ambulatory Visit: Payer: Self-pay

## 2024-08-19 ENCOUNTER — Ambulatory Visit (HOSPITAL_COMMUNITY)
Admission: RE | Admit: 2024-08-19 | Discharge: 2024-08-19 | Disposition: A | Discharge status: Home / Self Care | Attending: Vascular Surgery | Admitting: Vascular Surgery

## 2024-08-19 DIAGNOSIS — E1122 Type 2 diabetes mellitus with diabetic chronic kidney disease: Secondary | ICD-10-CM | POA: Insufficient documentation

## 2024-08-19 DIAGNOSIS — N186 End stage renal disease: Secondary | ICD-10-CM | POA: Insufficient documentation

## 2024-08-19 DIAGNOSIS — I120 Hypertensive chronic kidney disease with stage 5 chronic kidney disease or end stage renal disease: Secondary | ICD-10-CM | POA: Diagnosis not present

## 2024-08-19 DIAGNOSIS — T82590A Other mechanical complication of surgically created arteriovenous fistula, initial encounter: Secondary | ICD-10-CM

## 2024-08-19 DIAGNOSIS — T82510A Breakdown (mechanical) of surgically created arteriovenous fistula, initial encounter: Secondary | ICD-10-CM | POA: Diagnosis present

## 2024-08-19 DIAGNOSIS — Z79899 Other long term (current) drug therapy: Secondary | ICD-10-CM | POA: Insufficient documentation

## 2024-08-19 DIAGNOSIS — Y832 Surgical operation with anastomosis, bypass or graft as the cause of abnormal reaction of the patient, or of later complication, without mention of misadventure at the time of the procedure: Secondary | ICD-10-CM | POA: Insufficient documentation

## 2024-08-19 DIAGNOSIS — F17210 Nicotine dependence, cigarettes, uncomplicated: Secondary | ICD-10-CM | POA: Insufficient documentation

## 2024-08-19 DIAGNOSIS — Z992 Dependence on renal dialysis: Secondary | ICD-10-CM | POA: Insufficient documentation

## 2024-08-19 HISTORY — PX: A/V FISTULAGRAM: CATH118298

## 2024-08-19 LAB — GLUCOSE, CAPILLARY: Glucose-Capillary: 118 mg/dL — ABNORMAL HIGH (ref 70–99)

## 2024-08-19 SURGERY — A/V FISTULAGRAM
Anesthesia: LOCAL | Site: Arm Upper | Laterality: Left

## 2024-08-19 MED ORDER — ACETAMINOPHEN 325 MG PO TABS: 650.0000 mg | ORAL_TABLET | Freq: Four times a day (QID) | ORAL | Status: DC | PRN

## 2024-08-19 MED ORDER — HEPARIN (PORCINE) IN NACL 1000-0.9 UT/500ML-% IV SOLN
INTRAVENOUS | Status: DC | PRN
  Administered 2024-08-19: 500 mL

## 2024-08-19 MED ORDER — LIDOCAINE HCL (PF) 1 % IJ SOLN
INTRAMUSCULAR | Status: AC
  Filled 2024-08-19: qty 30

## 2024-08-19 MED ORDER — ACETAMINOPHEN 325 MG PO TABS
ORAL_TABLET | ORAL | Status: AC
  Administered 2024-08-19: 650 mg via ORAL
  Filled 2024-08-19: qty 2

## 2024-08-19 MED ORDER — LIDOCAINE HCL (PF) 1 % IJ SOLN
INTRAMUSCULAR | Status: DC | PRN
  Administered 2024-08-19: 5 mL

## 2024-08-19 MED ADMIN — Iodixanol Inj 320 MG/ML (Iodine Equivalent): 15 mL | INTRAVENOUS | NDC 00407222316

## 2024-08-19 SURGICAL SUPPLY — 6 items
KIT MICROPUNCTURE NIT STIFF (SHEATH) IMPLANT
MAT PREVALON FULL STRYKER (MISCELLANEOUS) IMPLANT
SHEATH PROBE COVER 6X72 (BAG) IMPLANT
STOPCOCK MORSE 400PSI 3WAY (MISCELLANEOUS) IMPLANT
TRAY PV CATH (CUSTOM PROCEDURE TRAY) ×1 IMPLANT
TUBING CIL FLEX 10 FLL-RA (TUBING) IMPLANT

## 2024-08-19 NOTE — Op Note (Signed)
    Patient name: Curtis Hartman MRN: [id number] DOB: May 09, 1961 Sex: male  08/19/2024 Pre-operative Diagnosis: End-stage renal disease, malfunction left arm AV fistula Post-operative diagnosis:  Same Surgeon:  Penne BROCKS. Sheree, MD Procedure Performed: 1.  Percutaneous ultrasound-guided cannulation left arm AV fistula 2.  Left upper extremity fistulogram  Indications: 63 year old male with history of end-stage renal disease dialyzing Mondays, Wednesdays and Fridays via left upper arm AV fistula.  He is having difficulty with cannulation as well as low flow when on dialysis.  He is indicated for fistulogram with possible intervention.  We have discussed the risk benefits and alternatives he demonstrates good understanding and agrees to proceed.  Findings: Fistula is patent throughout its course there is over the central veins and the arteriovenous anastomosis appears large and without stenosis.  There is 1 large branch in the middle of the upper arm blood flow appears to preferentially travel via the cephalic vein.  Fistula remains amenable to future percutaneous intervention, if he has continued issues with dialysis access we could consider branch ligation.   Procedure:  The patient was identified in the holding area and taken to the heart and vascular procedure room.  The patient was then placed supine on the table and prepped and draped in the usual sterile fashion.  A time out was called.  Ultrasound was used to evaluate the left arm AV fistula.  The area over the mid upper arm was anesthetized 1% lidocaine  and the fistula was cannulated with a micropuncture needle followed by a micropuncture wire and sheath using direct ultrasound visualization and an ultrasound image was saved to the permanent record.  We performed left upper extremity fistulogram including compression of the fistula with retrograde imaging.  With the above findings I elected no intervention.  The cannulation site was  suture-ligated with 4-0 Monocryl.  The patient tolerated the procedure without any complication.  Contrast: 15 cc     Carolyn Maniscalco C. Sheree, MD [facility] [place] Office: [phone] Pager: [phone]

## 2024-08-19 NOTE — H&P (Signed)
 H+P  History of Present Illness: This is a 63 y.o. male history of end-stage renal disease on dialysis via left upper arm AV fistula.  Currently dialyzes Mondays Wednesdays and Fridays.  He has had difficulty with cannulation with 5 attempts last week at dialysis has also had an infiltration event and has low flow when on dialysis.  He is here to discuss possible fistulogram with intervention.  Past Medical History:  Diagnosis Date   Arthritis    CKD (chronic kidney disease)    COPD (chronic obstructive pulmonary disease) (HCC)    Diabetes mellitus without complication (HCC)    type 2   GERD (gastroesophageal reflux disease)    Headache    Hx Migraines   Hepatitis    Hypertension    Neuropathy     Past Surgical History:  Procedure Laterality Date   ANTERIOR CERVICAL DECOMP/DISCECTOMY FUSION N/A 11/05/2022   Procedure: Anterior Cervical Decompression/discectomy Fusion Cervical Four-Cervical Five;  Surgeon: Debby Dorn MATSU, MD;  Location: [facility] OR;  Service: Neurosurgery;  Laterality: N/A;  3C   ANTERIOR LAT LUMBAR FUSION N/A 09/01/2023   Procedure: DIRECT LUMBAR INTERBODY FUSION, LUMBAR TWO-LUMBAR THREE, LUMBAR THREE-LUMBAR FOUR, LUMBAR FOUR-LUMBAR FIVE, RIGHT PRONE TRANSPSOAS, LUMBAR TWO LUMBAR FIVE POSTERIOR PERCUTANEOUS FUSION LUMBAR FOUR-LUMBAR FIVE MINIMALLY INVASIVE LAMINECTOMY, FORAMINOTOMY;  Surgeon: Debby Dorn MATSU, MD;  Location: [facility] OR;  Service: Neurosurgery;  Laterality: N/A;   AV FISTULA PLACEMENT Left 09/10/2023   Procedure: LEFT ARM ARTERIOVENOUS (AV) FISTULA CREATION;  Surgeon: Pearline Norman RAMAN, MD;  Location: [facility] OR;  Service: Vascular;  Laterality: Left;   BACK SURGERY  2023   Cervical Spine   BIOPSY  03/03/2022   Procedure: BIOPSY;  Surgeon: Cindie Carlin POUR, DO;  Location: [facility] SUITE;  Service: Endoscopy;;   COLONOSCOPY WITH PROPOFOL  N/A 03/03/2022   Procedure: COLONOSCOPY WITH PROPOFOL ;  Surgeon: Cindie Carlin POUR, DO;  Location: [facility];  Service:  Endoscopy;  Laterality: N/A;  8:00am   ESOPHAGOGASTRODUODENOSCOPY (EGD) WITH PROPOFOL  N/A 03/03/2022   Procedure: ESOPHAGOGASTRODUODENOSCOPY (EGD) WITH PROPOFOL ;  Surgeon: Cindie Carlin POUR, DO;  Location: [facility];  Service: Endoscopy;  Laterality: N/A;   foot right partial toe amputation Right    FOOT SURGERY Right    x3   hernia surgery x4     LUMBAR PERCUTANEOUS PEDICLE SCREW 3 LEVEL N/A 09/01/2023   Procedure: L2-5 LUMBAR PERCUTANEOUS PEDICLE SCREW 3 LEVEL;  Surgeon: Debby Dorn MATSU, MD;  Location: [facility] OR;  Service: Neurosurgery;  Laterality: N/A;   POLYPECTOMY  03/03/2022   Procedure: POLYPECTOMY;  Surgeon: Cindie Carlin POUR, DO;  Location: [facility];  Service: Endoscopy;;    No Known Allergies  Prior to Admission medications   Medication Sig Start Date End Date Taking? Authorizing Provider  acetaminophen  (TYLENOL ) 325 MG tablet Take 2 tablets (650 mg total) by mouth every 6 (six) hours as needed for mild pain (pain score 1-3) or fever (or Fever >/= 101). 07/08/24   Emokpae, Courage, MD  amLODipine  (NORVASC ) 5 MG tablet Take 1 tablet (5 mg total) by mouth daily. Hold if SBP < 100 mmhg 07/08/24 07/08/25  Pearlean Manus, MD  aspirin  EC 81 MG tablet Take 1 tablet (81 mg total) by mouth daily with breakfast. 07/08/24   Emokpae, Courage, MD  atorvastatin  (LIPITOR) 20 MG tablet Take 1 tablet (20 mg total) by mouth daily. 07/08/24   Pearlean Manus, MD  calcitRIOL  (ROCALTROL ) 0.25 MCG capsule Take 1 capsule (0.25 mcg total) by mouth daily. 07/08/24  Pearlean Manus, MD  Calcium  Acetate 667 MG TABS Take 1 tablet by mouth with breakfast, with lunch, and with evening meal. 07/08/24   Pearlean Manus, MD  carvedilol  (COREG ) 6.25 MG tablet Take 1 tablet (6.25 mg total) by mouth 2 (two) times daily with a meal. 07/08/24   Emokpae, Courage, MD  diphenoxylate -atropine  (LOMOTIL ) 2.5-0.025 MG tablet Take 1 tablet by mouth 4 (four) times daily as needed for diarrhea or loose stools. 06/03/24   Johnson,  Clanford L, MD  fluticasone  (FLONASE ) 50 MCG/ACT nasal spray Place 2 sprays into both nostrils daily. 09/24/23   Setzer, Sandra J, PA-C  LOKELMA  10 g PACK packet Take 10 g by mouth every Tuesday, Thursday, Saturday, and Sunday. 07/09/24   Pearlean Manus, MD  methocarbamol  (ROBAXIN ) 750 MG tablet Take 750 mg by mouth 3 (three) times daily. 06/29/24   [provider]  oxybutynin  (DITROPAN -XL) 10 MG 24 hr tablet Take 10 mg by mouth daily. 02/23/24   [provider]  oxyCODONE  (OXY IR/ROXICODONE ) 5 MG immediate release tablet Take 1 tablet (5 mg total) by mouth every 6 (six) hours as needed for severe pain (pain score 7-10). 06/08/24   Tat, Alm, MD  traZODone  (DESYREL ) 50 MG tablet Take 1 tablet (50 mg total) by mouth at bedtime as needed for sleep. 07/08/24   Pearlean Manus, MD    Social History   Socioeconomic History   Marital status: Single    Spouse name: Not on file   Number of children: Not on file   Years of education: Not on file   Highest education level: Not on file  Occupational History   Not on file  Tobacco Use   Smoking status: Every Day    Current packs/day: 0.25    Types: Cigarettes    Passive exposure: Never   Smokeless tobacco: Never   Tobacco comments:    Pt smokes a couple cigarettes a day (as of 08/26/23)  Vaping Use   Vaping status: Never Used  Substance and Sexual Activity   Alcohol use: Not Currently   Drug use: Not Currently   Sexual activity: Not Currently  Other Topics Concern   Not on file  Social History Narrative   Not on file   Social Drivers of Health   Financial Resource Strain: High Risk (01/15/2024)   Received from [facility]   Overall Financial Resource Strain (CARDIA)    Difficulty of Paying Living Expenses: Very hard  Food Insecurity: Patient Unable To Answer (07/07/2024)   Hunger Vital Sign    Worried About Running Out of Food in the Last Year: Patient unable to answer    Ran Out of Food in the Last Year: Patient  unable to answer  Transportation Needs: No Transportation Needs (06/02/2024)   PRAPARE - Administrator, Civil Service (Medical): No    Lack of Transportation (Non-Medical): No  Physical Activity: Unknown (01/15/2024)   Received from [facility]   Exercise Vital Sign    On average, how many days per week do you engage in moderate to strenuous exercise (like a brisk walk)?: 0 days    Minutes of Exercise per Session: Not on file  Stress: Stress Concern Present (02/29/2024)   Received from [facility] - Occupational Stress Questionnaire    Feeling of Stress : To some extent  Social Connections: Socially Isolated (06/02/2024)   Social Connection and Isolation Panel    Frequency of Communication with Friends and  Family: More than three times a week    Frequency of Social Gatherings with Friends and Family: More than three times a week    Attends Religious Services: Never    Database administrator or Organizations: No    Attends Banker Meetings: Never    Marital Status: Never married  Intimate Partner Violence: Not At Risk (06/02/2024)   Humiliation, Afraid, Rape, and Kick questionnaire    Fear of Current or Ex-Partner: No    Emotionally Abused: No    Physically Abused: No    Sexually Abused: No     Family History  Problem Relation Age of Onset   Heart disease Mother    Seizures Father    Stroke Maternal Grandfather    Liver disease Paternal Grandmother     ROS: as above   Physical Examination Vitals:   08/19/24 0730 08/19/24 0743  BP: (!) 232/105   Pulse: 79   Resp: 12 16  Temp: 97.8 F (36.6 C)   SpO2: 100%     Awake alert and oriented Nonlabored respirations Left upper arm AV fistula has strong thrill with bruising  CBC    Component Value Date/Time   WBC 7.5 07/08/2024 0501   RBC 3.32 (L) 07/08/2024 0501   HGB 10.0 (L) 07/08/2024 0501   HCT 30.7 (L) 07/08/2024 0501   PLT 267 07/08/2024 0501    MCV 92.5 07/08/2024 0501   MCH 30.1 07/08/2024 0501   MCHC 32.6 07/08/2024 0501   RDW 14.3 07/08/2024 0501   LYMPHSABS 1.0 07/06/2024 1241   MONOABS 0.4 07/06/2024 1241   EOSABS 0.1 07/06/2024 1241   BASOSABS 0.1 07/06/2024 1241    BMET    Component Value Date/Time   NA 139 07/08/2024 0338   K 4.5 07/08/2024 0338   CL 99 07/08/2024 0338   CO2 26 07/08/2024 0338   GLUCOSE 102 (H) 07/08/2024 0338   BUN 51 (H) 07/08/2024 0338   CREATININE 6.51 (H) 07/08/2024 0338   CREATININE 2.78 (H) 11/27/2022 1317   CALCIUM  9.1 07/08/2024 0338   GFRNONAA 9 (L) 07/08/2024 0338    COAGS: Lab Results  Component Value Date   INR 0.9 09/09/2021     ASSESSMENT/PLAN: This is a 63 y.o. male history of end-stage renal disease on dialysis via left arm AV fistula.  He has had difficulty with cannulation as well as low flows on dialysis.  We have discussed his options being continued watchful waiting versus catheter placement versus fistulogram.  Patient has elected for fistulogram.  We have discussed the risk benefits alternatives and possible need for intervention and he demonstrates good understanding and agrees to proceed.  Lyndia Bury C. Sheree, MD [facility] [place] Office: [phone] Pager: [phone]

## 2024-08-22 ENCOUNTER — Encounter (HOSPITAL_COMMUNITY): Payer: Self-pay | Admitting: Vascular Surgery

## 2024-08-27 ENCOUNTER — Other Ambulatory Visit: Payer: Self-pay | Admitting: Surgery

## 2024-08-28 NOTE — Progress Notes (Signed)
 [facility]  Progress Note  Assessment/Plan   1.  Hyperkalemia: 63 year old male with end-stage renal disease presented with severe, life-threatening hyperkalemia due to dialysis noncompliance.  Hyperkalemia resolved after emergent dialysis 10/24.  2.  Hypertension: Patient is noncompliant with dialysis at home blood pressure medicines according to his sister.  Resume home blood pressure meds when able to take oral or NG medications.  Blood pressures improved this morning.  3.  ESRD with dialysis noncompliance: 63 year old male who normally has dialysis in [place]  on a Monday Wednesday Friday schedule but frequently misses treatments and had missed 1 week of dialysis treatments before presentation.  Severe azotemia, acidosis, and hyperkalemia all resolved with 4-hour dialysis treatment10/24.  Plan next dialysis tomorrow morning.  3.  Patient's mechanically ventilated.  On minimal vent support but not waking up well off sedation for about 24 hours now.    Interval History   63 year old male with end-stage renal disease has been on dialysis since July 2025 and normally has dialysis in [place] .  Has history of stroke and is nonambulatory.  He lives with his sister who reports that he is frequently noncompliant with dialysis and had refused to go to dialysis for the last week.  Presented to an outside hospital with severe hyperkalemia was transferred here after being intubated due to agitation.  Underwent emergent hemodialysis late at night on 10/24 with improvement in his acidosis and hyperkalemia.  His left arm AV fistula functioned well.  Remains intubated.  Sedation was turned off yesterday morning but still not waking up well.  Opens eyes to sternal rub but not to voice.  Did not follow commands.  Infusions:  . niCARdipine 5 mg/hr (08/28/24 0600)   Medications:  . aspirin   81 mg Oral q AM  . epoetin alfa-epbx  7,000 Units  IntraVENous M/W/F  . heparin   5,000 Units Subcutaneous Q8H SCH  . pantoprazole   40 mg IntraVENous Daily  . ferric Na complex  125 mg IntraVENous Q24H   Physical Examination  Temp:  [97.1 F (36.2 C)-100 F (37.8 C)] 99.5 F (37.5 C) Heart Rate:  [53-79] 66 Resp:  [12-17] 15 BP: (123-197)/(55-91) 141/67 SpO2:  [99 %-100 %] 100 % Pain Score: 0-No pain CPOT Score: 0 O2 Device: Endotracheal Tube FiO2: 30 %     Intakes & Outputs (Last 24 hours) at 08/28/2024 0700 Last data filed at 08/28/2024 0600 24hr Volume @0700   Intake 1055.03 mL  Output 0 mL  Net 1055.03 mL       Physical Exam  General: Chronically ill-appearing male who is in no acute distress. He is orally intubated mechanically ventilated.  Opens eyes to sternal rub.  Not following commands Neck: No JVD Lungs clear with symmetric breath sounds Heart is regular.  No rub. Extremities: No edema Dialysis access: Left upper arm AV fistula has good thrill and bruit.  Results   Recent Results (from the past 24 hours)  POCT Glucose   Collection Time: 08/27/24  8:22 AM  Result Value Ref Range   Glucose, POC 127 (H) 70 - 99 mg/dL   OPERATOR ID [id number]    INSTRUMENT ID [id number]   POCT Glucose   Collection Time: 08/27/24 12:37 PM  Result Value Ref Range   Glucose, POC 120 (H) 70 - 99 mg/dL   OPERATOR ID [id number]    INSTRUMENT ID [id number]   POCT Glucose   Collection Time: 08/27/24  4:21 PM  Result Value Ref Range  Glucose, POC 120 (H) 70 - 99 mg/dL   OPERATOR ID [id number]    INSTRUMENT ID [id number]   POCT Glucose   Collection Time: 08/27/24  8:45 PM  Result Value Ref Range   Glucose, POC 158 (H) 70 - 99 mg/dL   OPERATOR ID [id number]    INSTRUMENT ID [id number]   POCT Glucose   Collection Time: 08/27/24 11:35 PM  Result Value Ref Range   Glucose, POC 186 (H) 70 - 99 mg/dL   OPERATOR ID [id number]    INSTRUMENT ID [id number]   CBC   Collection Time: 08/28/24  2:07 AM  Result Value Ref Range    WBC 8.4 4.0 - 10.5 thou/mcL   RBC 2.76 (L) 4.63 - 6.08 million/mcL   HGB 8.3 (L) 13.7 - 17.5 gm/dL   HCT 74.5 (L) 59.8 - 48.9 %   MCV 92.0 79.0 - 92.2 fL   MCH 30.1 25.7 - 32.2 pg   MCHC 32.7 32.3 - 36.5 gm/dL   Plt Ct 827 849 - 599 thou/mcL   RDW SD 46.6 (H) 35.1 - 46.3 fL   MPV 10.3 9.4 - 12.4 fL   NRBC% 0.0 0.0 - 0.2 /100WBC   Absolute NRBC Count 0.00 0.00 - 0.01 thou/mcL  Basic Metabolic Panel   Collection Time: 08/28/24  2:07 AM  Result Value Ref Range   Na 139 136 - 146 mmol/L   Potassium 5.0 3.7 - 5.4 mmol/L   Cl 96 (L) 97 - 108 mmol/L   CO2 17 (L) 20 - 32 mmol/L   AGAP 26 (H) 7 - 16 mmol/L   Glucose 167 (H) 65 - 99 mg/dL   BUN 61 (H) 8 - 27 mg/dL   Creatinine 3.06 (H) 9.23 - 1.27 mg/dL   Ca 9.6 8.6 - 89.7 mg/dL   BUN/CREAT RATIO 8.8 (L) 11.0 - 26.0   eGFR 8 (L) >=60 mL/min/1.33m2  Magnesium    Collection Time: 08/28/24  2:07 AM  Result Value Ref Range   Mg 2.2 1.6 - 2.6 mg/dL  Phosphorus   Collection Time: 08/28/24  2:07 AM  Result Value Ref Range   Phos 9.7 (H) 2.5 - 4.5 mg/dL  Hepatitis B Surface Antigen   Collection Time: 08/28/24  2:07 AM  Result Value Ref Range   HEP BS AG Negative   POCT Glucose   Collection Time: 08/28/24  4:14 AM  Result Value Ref Range   Glucose, POC 154 (H) 70 - 99 mg/dL   OPERATOR ID [id number]    INSTRUMENT ID [id number]     This note was dictated with voice recognition software. Similar sounding words can inadvertently be transcribed and may not be corrected upon review. Electronically signed: Marsa JAYSON Silversmith, MD 08/28/2024 / 7:32 AM

## 2024-08-29 ENCOUNTER — Encounter: Payer: Self-pay | Admitting: Neurology

## 2024-08-29 ENCOUNTER — Ambulatory Visit: Admitting: Neurology

## 2024-08-29 NOTE — Care Plan (Signed)
  Problem: Restraint Use, Nonviolent/Non-Self-Destructive Behavior Goal: Absence of injury Outcome: Progressing   Problem: Sensory Perception - Impaired Goal: Absence of physical injury Outcome: Progressing   Problem: Fall Prevention Goal: Absence of falls Outcome: Progressing   Problem: Pressure Injury Goal: Absence of new pressure injury Outcome: Progressing   Problem: Discharge Planning Goal: Knowledge of medical problems (What is my main problem?) Outcome: Not Progressing Goal: Knowledge of self care (What do I need to do when I go home?) Outcome: Not Progressing Goal: Knowledge of treatment plan (Why is it important for me to do this?) Outcome: Not Progressing Goal: Knowledge of medication management Outcome: Not Progressing

## 2024-09-05 ENCOUNTER — Encounter: Payer: Self-pay | Admitting: Radiology
